# Patient Record
Sex: Male | Born: 1945 | Race: White | Hispanic: No | Marital: Married | Smoking: Former smoker
Health system: Southern US, Community
[De-identification: ages and names within clinical notes are randomized; demographics above are authoritative.]

## PROBLEM LIST (undated history)

## (undated) DIAGNOSIS — I2510 Atherosclerotic heart disease of native coronary artery without angina pectoris: Secondary | ICD-10-CM

## (undated) DIAGNOSIS — B019 Varicella without complication: Secondary | ICD-10-CM

## (undated) DIAGNOSIS — E785 Hyperlipidemia, unspecified: Secondary | ICD-10-CM

## (undated) DIAGNOSIS — D649 Anemia, unspecified: Secondary | ICD-10-CM

## (undated) DIAGNOSIS — M1990 Unspecified osteoarthritis, unspecified site: Secondary | ICD-10-CM

## (undated) DIAGNOSIS — R001 Bradycardia, unspecified: Secondary | ICD-10-CM

## (undated) DIAGNOSIS — H269 Unspecified cataract: Secondary | ICD-10-CM

## (undated) DIAGNOSIS — F419 Anxiety disorder, unspecified: Secondary | ICD-10-CM

## (undated) DIAGNOSIS — T7840XA Allergy, unspecified, initial encounter: Secondary | ICD-10-CM

## (undated) DIAGNOSIS — I10 Essential (primary) hypertension: Secondary | ICD-10-CM

## (undated) DIAGNOSIS — C4490 Unspecified malignant neoplasm of skin, unspecified: Secondary | ICD-10-CM

## (undated) HISTORY — PX: SKIN BIOPSY: SHX1

## (undated) HISTORY — DX: Essential (primary) hypertension: I10

## (undated) HISTORY — DX: Bradycardia, unspecified: R00.1

## (undated) HISTORY — PX: CORONARY ARTERY BYPASS GRAFT: SHX141

## (undated) HISTORY — DX: Hyperlipidemia, unspecified: E78.5

## (undated) HISTORY — PX: EYE SURGERY: SHX253

## (undated) HISTORY — DX: Anxiety disorder, unspecified: F41.9

## (undated) HISTORY — DX: Unspecified cataract: H26.9

## (undated) HISTORY — DX: Unspecified malignant neoplasm of skin, unspecified: C44.90

## (undated) HISTORY — PX: HERNIA REPAIR: SHX51

## (undated) HISTORY — DX: Varicella without complication: B01.9

## (undated) HISTORY — PX: CATARACT EXTRACTION: SUR2

## (undated) HISTORY — DX: Atherosclerotic heart disease of native coronary artery without angina pectoris: I25.10

## (undated) HISTORY — DX: Unspecified osteoarthritis, unspecified site: M19.90

## (undated) HISTORY — DX: Allergy, unspecified, initial encounter: T78.40XA

## (undated) HISTORY — DX: Anemia, unspecified: D64.9

## (undated) HISTORY — PX: REFRACTIVE SURGERY: SHX103

---

## 1994-12-08 DIAGNOSIS — I2510 Atherosclerotic heart disease of native coronary artery without angina pectoris: Secondary | ICD-10-CM

## 1994-12-08 HISTORY — PX: BYPASS GRAFT: SHX909

## 1994-12-08 HISTORY — DX: Atherosclerotic heart disease of native coronary artery without angina pectoris: I25.10

## 2001-12-08 HISTORY — PX: UMBILICAL HERNIA REPAIR: SHX196

## 2014-12-25 DIAGNOSIS — S33101A Dislocation of unspecified lumbar vertebra, initial encounter: Secondary | ICD-10-CM | POA: Diagnosis not present

## 2014-12-25 DIAGNOSIS — M5136 Other intervertebral disc degeneration, lumbar region: Secondary | ICD-10-CM | POA: Diagnosis not present

## 2014-12-25 DIAGNOSIS — S332XXA Dislocation of sacroiliac and sacrococcygeal joint, initial encounter: Secondary | ICD-10-CM | POA: Diagnosis not present

## 2014-12-25 DIAGNOSIS — M2540 Effusion, unspecified joint: Secondary | ICD-10-CM | POA: Diagnosis not present

## 2014-12-25 DIAGNOSIS — M5430 Sciatica, unspecified side: Secondary | ICD-10-CM | POA: Diagnosis not present

## 2014-12-25 DIAGNOSIS — M5020 Other cervical disc displacement, unspecified cervical region: Secondary | ICD-10-CM | POA: Diagnosis not present

## 2014-12-26 DIAGNOSIS — Z7901 Long term (current) use of anticoagulants: Secondary | ICD-10-CM | POA: Diagnosis not present

## 2014-12-26 DIAGNOSIS — I4891 Unspecified atrial fibrillation: Secondary | ICD-10-CM | POA: Diagnosis not present

## 2015-01-11 DIAGNOSIS — Z7901 Long term (current) use of anticoagulants: Secondary | ICD-10-CM | POA: Diagnosis not present

## 2015-01-11 DIAGNOSIS — I4891 Unspecified atrial fibrillation: Secondary | ICD-10-CM | POA: Diagnosis not present

## 2015-01-15 DIAGNOSIS — M5136 Other intervertebral disc degeneration, lumbar region: Secondary | ICD-10-CM | POA: Diagnosis not present

## 2015-01-15 DIAGNOSIS — M5430 Sciatica, unspecified side: Secondary | ICD-10-CM | POA: Diagnosis not present

## 2015-01-15 DIAGNOSIS — S332XXA Dislocation of sacroiliac and sacrococcygeal joint, initial encounter: Secondary | ICD-10-CM | POA: Diagnosis not present

## 2015-01-15 DIAGNOSIS — S33101A Dislocation of unspecified lumbar vertebra, initial encounter: Secondary | ICD-10-CM | POA: Diagnosis not present

## 2015-01-15 DIAGNOSIS — M5020 Other cervical disc displacement, unspecified cervical region: Secondary | ICD-10-CM | POA: Diagnosis not present

## 2015-01-15 DIAGNOSIS — M2540 Effusion, unspecified joint: Secondary | ICD-10-CM | POA: Diagnosis not present

## 2015-01-25 DIAGNOSIS — I4891 Unspecified atrial fibrillation: Secondary | ICD-10-CM | POA: Diagnosis not present

## 2015-01-25 DIAGNOSIS — Z7901 Long term (current) use of anticoagulants: Secondary | ICD-10-CM | POA: Diagnosis not present

## 2015-02-05 DIAGNOSIS — M2540 Effusion, unspecified joint: Secondary | ICD-10-CM | POA: Diagnosis not present

## 2015-02-05 DIAGNOSIS — M5430 Sciatica, unspecified side: Secondary | ICD-10-CM | POA: Diagnosis not present

## 2015-02-05 DIAGNOSIS — S33101A Dislocation of unspecified lumbar vertebra, initial encounter: Secondary | ICD-10-CM | POA: Diagnosis not present

## 2015-02-05 DIAGNOSIS — M5136 Other intervertebral disc degeneration, lumbar region: Secondary | ICD-10-CM | POA: Diagnosis not present

## 2015-02-05 DIAGNOSIS — M5020 Other cervical disc displacement, unspecified cervical region: Secondary | ICD-10-CM | POA: Diagnosis not present

## 2015-02-05 DIAGNOSIS — S332XXA Dislocation of sacroiliac and sacrococcygeal joint, initial encounter: Secondary | ICD-10-CM | POA: Diagnosis not present

## 2015-02-07 DIAGNOSIS — Z7901 Long term (current) use of anticoagulants: Secondary | ICD-10-CM | POA: Diagnosis not present

## 2015-02-07 DIAGNOSIS — I4891 Unspecified atrial fibrillation: Secondary | ICD-10-CM | POA: Diagnosis not present

## 2015-02-21 DIAGNOSIS — I4891 Unspecified atrial fibrillation: Secondary | ICD-10-CM | POA: Diagnosis not present

## 2015-02-21 DIAGNOSIS — Z5181 Encounter for therapeutic drug level monitoring: Secondary | ICD-10-CM | POA: Diagnosis not present

## 2015-02-21 DIAGNOSIS — Z7901 Long term (current) use of anticoagulants: Secondary | ICD-10-CM | POA: Diagnosis not present

## 2015-02-23 DIAGNOSIS — E785 Hyperlipidemia, unspecified: Secondary | ICD-10-CM | POA: Diagnosis not present

## 2015-02-23 DIAGNOSIS — E559 Vitamin D deficiency, unspecified: Secondary | ICD-10-CM | POA: Diagnosis not present

## 2015-02-23 DIAGNOSIS — Z125 Encounter for screening for malignant neoplasm of prostate: Secondary | ICD-10-CM | POA: Diagnosis not present

## 2015-02-23 DIAGNOSIS — I10 Essential (primary) hypertension: Secondary | ICD-10-CM | POA: Diagnosis not present

## 2015-02-23 DIAGNOSIS — I482 Chronic atrial fibrillation: Secondary | ICD-10-CM | POA: Diagnosis not present

## 2015-02-26 DIAGNOSIS — M5430 Sciatica, unspecified side: Secondary | ICD-10-CM | POA: Diagnosis not present

## 2015-02-26 DIAGNOSIS — M2540 Effusion, unspecified joint: Secondary | ICD-10-CM | POA: Diagnosis not present

## 2015-02-26 DIAGNOSIS — M5136 Other intervertebral disc degeneration, lumbar region: Secondary | ICD-10-CM | POA: Diagnosis not present

## 2015-02-26 DIAGNOSIS — M5020 Other cervical disc displacement, unspecified cervical region: Secondary | ICD-10-CM | POA: Diagnosis not present

## 2015-02-26 DIAGNOSIS — S332XXA Dislocation of sacroiliac and sacrococcygeal joint, initial encounter: Secondary | ICD-10-CM | POA: Diagnosis not present

## 2015-02-26 DIAGNOSIS — S33101A Dislocation of unspecified lumbar vertebra, initial encounter: Secondary | ICD-10-CM | POA: Diagnosis not present

## 2015-03-07 DIAGNOSIS — Z7901 Long term (current) use of anticoagulants: Secondary | ICD-10-CM | POA: Diagnosis not present

## 2015-03-07 DIAGNOSIS — I4891 Unspecified atrial fibrillation: Secondary | ICD-10-CM | POA: Diagnosis not present

## 2015-03-19 DIAGNOSIS — M5136 Other intervertebral disc degeneration, lumbar region: Secondary | ICD-10-CM | POA: Diagnosis not present

## 2015-03-19 DIAGNOSIS — S33101A Dislocation of unspecified lumbar vertebra, initial encounter: Secondary | ICD-10-CM | POA: Diagnosis not present

## 2015-03-19 DIAGNOSIS — S332XXA Dislocation of sacroiliac and sacrococcygeal joint, initial encounter: Secondary | ICD-10-CM | POA: Diagnosis not present

## 2015-03-19 DIAGNOSIS — M5020 Other cervical disc displacement, unspecified cervical region: Secondary | ICD-10-CM | POA: Diagnosis not present

## 2015-03-19 DIAGNOSIS — M5430 Sciatica, unspecified side: Secondary | ICD-10-CM | POA: Diagnosis not present

## 2015-03-19 DIAGNOSIS — M2540 Effusion, unspecified joint: Secondary | ICD-10-CM | POA: Diagnosis not present

## 2015-03-26 DIAGNOSIS — I4891 Unspecified atrial fibrillation: Secondary | ICD-10-CM | POA: Diagnosis not present

## 2015-03-26 DIAGNOSIS — Z7901 Long term (current) use of anticoagulants: Secondary | ICD-10-CM | POA: Diagnosis not present

## 2015-04-09 DIAGNOSIS — S33101A Dislocation of unspecified lumbar vertebra, initial encounter: Secondary | ICD-10-CM | POA: Diagnosis not present

## 2015-04-09 DIAGNOSIS — Z7901 Long term (current) use of anticoagulants: Secondary | ICD-10-CM | POA: Diagnosis not present

## 2015-04-09 DIAGNOSIS — S332XXA Dislocation of sacroiliac and sacrococcygeal joint, initial encounter: Secondary | ICD-10-CM | POA: Diagnosis not present

## 2015-04-09 DIAGNOSIS — M5136 Other intervertebral disc degeneration, lumbar region: Secondary | ICD-10-CM | POA: Diagnosis not present

## 2015-04-09 DIAGNOSIS — M5020 Other cervical disc displacement, unspecified cervical region: Secondary | ICD-10-CM | POA: Diagnosis not present

## 2015-04-09 DIAGNOSIS — M2540 Effusion, unspecified joint: Secondary | ICD-10-CM | POA: Diagnosis not present

## 2015-04-09 DIAGNOSIS — M5430 Sciatica, unspecified side: Secondary | ICD-10-CM | POA: Diagnosis not present

## 2015-04-09 DIAGNOSIS — I4891 Unspecified atrial fibrillation: Secondary | ICD-10-CM | POA: Diagnosis not present

## 2015-04-25 DIAGNOSIS — M549 Dorsalgia, unspecified: Secondary | ICD-10-CM | POA: Diagnosis not present

## 2015-04-25 DIAGNOSIS — Z0000 Encounter for general adult medical examination without abnormal findings: Secondary | ICD-10-CM | POA: Diagnosis not present

## 2015-04-25 DIAGNOSIS — E785 Hyperlipidemia, unspecified: Secondary | ICD-10-CM | POA: Diagnosis not present

## 2015-04-25 DIAGNOSIS — M40204 Unspecified kyphosis, thoracic region: Secondary | ICD-10-CM | POA: Diagnosis not present

## 2015-04-25 DIAGNOSIS — Z23 Encounter for immunization: Secondary | ICD-10-CM | POA: Diagnosis not present

## 2015-04-25 DIAGNOSIS — I10 Essential (primary) hypertension: Secondary | ICD-10-CM | POA: Diagnosis not present

## 2015-04-25 DIAGNOSIS — E559 Vitamin D deficiency, unspecified: Secondary | ICD-10-CM | POA: Diagnosis not present

## 2015-04-26 DIAGNOSIS — Z7901 Long term (current) use of anticoagulants: Secondary | ICD-10-CM | POA: Diagnosis not present

## 2015-04-26 DIAGNOSIS — I4891 Unspecified atrial fibrillation: Secondary | ICD-10-CM | POA: Diagnosis not present

## 2015-04-30 DIAGNOSIS — M25411 Effusion, right shoulder: Secondary | ICD-10-CM | POA: Diagnosis not present

## 2015-04-30 DIAGNOSIS — S332XXA Dislocation of sacroiliac and sacrococcygeal joint, initial encounter: Secondary | ICD-10-CM | POA: Diagnosis not present

## 2015-04-30 DIAGNOSIS — M5432 Sciatica, left side: Secondary | ICD-10-CM | POA: Diagnosis not present

## 2015-04-30 DIAGNOSIS — M5431 Sciatica, right side: Secondary | ICD-10-CM | POA: Diagnosis not present

## 2015-04-30 DIAGNOSIS — M5021 Other cervical disc displacement,  high cervical region: Secondary | ICD-10-CM | POA: Diagnosis not present

## 2015-04-30 DIAGNOSIS — S33111A Dislocation of L1/L2 lumbar vertebra, initial encounter: Secondary | ICD-10-CM | POA: Diagnosis not present

## 2015-04-30 DIAGNOSIS — M5136 Other intervertebral disc degeneration, lumbar region: Secondary | ICD-10-CM | POA: Diagnosis not present

## 2015-05-03 DIAGNOSIS — Z85828 Personal history of other malignant neoplasm of skin: Secondary | ICD-10-CM | POA: Diagnosis not present

## 2015-05-03 DIAGNOSIS — C4492 Squamous cell carcinoma of skin, unspecified: Secondary | ICD-10-CM | POA: Diagnosis not present

## 2015-05-03 DIAGNOSIS — D485 Neoplasm of uncertain behavior of skin: Secondary | ICD-10-CM | POA: Diagnosis not present

## 2015-05-03 DIAGNOSIS — C44329 Squamous cell carcinoma of skin of other parts of face: Secondary | ICD-10-CM | POA: Diagnosis not present

## 2015-05-04 DIAGNOSIS — M40204 Unspecified kyphosis, thoracic region: Secondary | ICD-10-CM | POA: Diagnosis not present

## 2015-05-14 DIAGNOSIS — Z7901 Long term (current) use of anticoagulants: Secondary | ICD-10-CM | POA: Diagnosis not present

## 2015-05-14 DIAGNOSIS — M5136 Other intervertebral disc degeneration, lumbar region: Secondary | ICD-10-CM | POA: Diagnosis not present

## 2015-05-14 DIAGNOSIS — M5432 Sciatica, left side: Secondary | ICD-10-CM | POA: Diagnosis not present

## 2015-05-14 DIAGNOSIS — M5021 Other cervical disc displacement,  high cervical region: Secondary | ICD-10-CM | POA: Diagnosis not present

## 2015-05-14 DIAGNOSIS — I4891 Unspecified atrial fibrillation: Secondary | ICD-10-CM | POA: Diagnosis not present

## 2015-05-14 DIAGNOSIS — S33111A Dislocation of L1/L2 lumbar vertebra, initial encounter: Secondary | ICD-10-CM | POA: Diagnosis not present

## 2015-05-14 DIAGNOSIS — M25411 Effusion, right shoulder: Secondary | ICD-10-CM | POA: Diagnosis not present

## 2015-05-14 DIAGNOSIS — S332XXA Dislocation of sacroiliac and sacrococcygeal joint, initial encounter: Secondary | ICD-10-CM | POA: Diagnosis not present

## 2015-05-14 DIAGNOSIS — M5431 Sciatica, right side: Secondary | ICD-10-CM | POA: Diagnosis not present

## 2015-06-04 DIAGNOSIS — M25411 Effusion, right shoulder: Secondary | ICD-10-CM | POA: Diagnosis not present

## 2015-06-04 DIAGNOSIS — S332XXA Dislocation of sacroiliac and sacrococcygeal joint, initial encounter: Secondary | ICD-10-CM | POA: Diagnosis not present

## 2015-06-04 DIAGNOSIS — M5021 Other cervical disc displacement,  high cervical region: Secondary | ICD-10-CM | POA: Diagnosis not present

## 2015-06-04 DIAGNOSIS — M5431 Sciatica, right side: Secondary | ICD-10-CM | POA: Diagnosis not present

## 2015-06-04 DIAGNOSIS — M5136 Other intervertebral disc degeneration, lumbar region: Secondary | ICD-10-CM | POA: Diagnosis not present

## 2015-06-04 DIAGNOSIS — Z7901 Long term (current) use of anticoagulants: Secondary | ICD-10-CM | POA: Diagnosis not present

## 2015-06-04 DIAGNOSIS — S33111A Dislocation of L1/L2 lumbar vertebra, initial encounter: Secondary | ICD-10-CM | POA: Diagnosis not present

## 2015-06-04 DIAGNOSIS — M5432 Sciatica, left side: Secondary | ICD-10-CM | POA: Diagnosis not present

## 2015-06-04 DIAGNOSIS — I4891 Unspecified atrial fibrillation: Secondary | ICD-10-CM | POA: Diagnosis not present

## 2015-06-04 DIAGNOSIS — Z5181 Encounter for therapeutic drug level monitoring: Secondary | ICD-10-CM | POA: Diagnosis not present

## 2015-06-07 DIAGNOSIS — C4492 Squamous cell carcinoma of skin, unspecified: Secondary | ICD-10-CM | POA: Diagnosis not present

## 2015-06-07 DIAGNOSIS — C44329 Squamous cell carcinoma of skin of other parts of face: Secondary | ICD-10-CM | POA: Diagnosis not present

## 2015-06-07 DIAGNOSIS — C44212 Basal cell carcinoma of skin of right ear and external auricular canal: Secondary | ICD-10-CM | POA: Diagnosis not present

## 2015-06-18 DIAGNOSIS — M5431 Sciatica, right side: Secondary | ICD-10-CM | POA: Diagnosis not present

## 2015-06-18 DIAGNOSIS — M5136 Other intervertebral disc degeneration, lumbar region: Secondary | ICD-10-CM | POA: Diagnosis not present

## 2015-06-18 DIAGNOSIS — Z7901 Long term (current) use of anticoagulants: Secondary | ICD-10-CM | POA: Diagnosis not present

## 2015-06-18 DIAGNOSIS — M25411 Effusion, right shoulder: Secondary | ICD-10-CM | POA: Diagnosis not present

## 2015-06-18 DIAGNOSIS — S33111A Dislocation of L1/L2 lumbar vertebra, initial encounter: Secondary | ICD-10-CM | POA: Diagnosis not present

## 2015-06-18 DIAGNOSIS — M5432 Sciatica, left side: Secondary | ICD-10-CM | POA: Diagnosis not present

## 2015-06-18 DIAGNOSIS — S332XXA Dislocation of sacroiliac and sacrococcygeal joint, initial encounter: Secondary | ICD-10-CM | POA: Diagnosis not present

## 2015-06-18 DIAGNOSIS — M5021 Other cervical disc displacement,  high cervical region: Secondary | ICD-10-CM | POA: Diagnosis not present

## 2015-06-18 DIAGNOSIS — I4891 Unspecified atrial fibrillation: Secondary | ICD-10-CM | POA: Diagnosis not present

## 2015-07-02 DIAGNOSIS — M25411 Effusion, right shoulder: Secondary | ICD-10-CM | POA: Diagnosis not present

## 2015-07-02 DIAGNOSIS — M5136 Other intervertebral disc degeneration, lumbar region: Secondary | ICD-10-CM | POA: Diagnosis not present

## 2015-07-02 DIAGNOSIS — S332XXA Dislocation of sacroiliac and sacrococcygeal joint, initial encounter: Secondary | ICD-10-CM | POA: Diagnosis not present

## 2015-07-02 DIAGNOSIS — M5432 Sciatica, left side: Secondary | ICD-10-CM | POA: Diagnosis not present

## 2015-07-02 DIAGNOSIS — M5431 Sciatica, right side: Secondary | ICD-10-CM | POA: Diagnosis not present

## 2015-07-02 DIAGNOSIS — M5021 Other cervical disc displacement,  high cervical region: Secondary | ICD-10-CM | POA: Diagnosis not present

## 2015-07-02 DIAGNOSIS — S33111A Dislocation of L1/L2 lumbar vertebra, initial encounter: Secondary | ICD-10-CM | POA: Diagnosis not present

## 2015-07-16 DIAGNOSIS — Z5181 Encounter for therapeutic drug level monitoring: Secondary | ICD-10-CM | POA: Diagnosis not present

## 2015-07-16 DIAGNOSIS — Z7901 Long term (current) use of anticoagulants: Secondary | ICD-10-CM | POA: Diagnosis not present

## 2015-07-16 DIAGNOSIS — S332XXA Dislocation of sacroiliac and sacrococcygeal joint, initial encounter: Secondary | ICD-10-CM | POA: Diagnosis not present

## 2015-07-16 DIAGNOSIS — M5430 Sciatica, unspecified side: Secondary | ICD-10-CM | POA: Diagnosis not present

## 2015-07-16 DIAGNOSIS — M5136 Other intervertebral disc degeneration, lumbar region: Secondary | ICD-10-CM | POA: Diagnosis not present

## 2015-07-16 DIAGNOSIS — I4891 Unspecified atrial fibrillation: Secondary | ICD-10-CM | POA: Diagnosis not present

## 2015-07-16 DIAGNOSIS — M2540 Effusion, unspecified joint: Secondary | ICD-10-CM | POA: Diagnosis not present

## 2015-07-16 DIAGNOSIS — S33101A Dislocation of unspecified lumbar vertebra, initial encounter: Secondary | ICD-10-CM | POA: Diagnosis not present

## 2015-07-16 DIAGNOSIS — M5020 Other cervical disc displacement, unspecified cervical region: Secondary | ICD-10-CM | POA: Diagnosis not present

## 2015-07-18 DIAGNOSIS — C4492 Squamous cell carcinoma of skin, unspecified: Secondary | ICD-10-CM | POA: Diagnosis not present

## 2015-07-18 DIAGNOSIS — C44329 Squamous cell carcinoma of skin of other parts of face: Secondary | ICD-10-CM | POA: Diagnosis not present

## 2015-07-30 DIAGNOSIS — M2540 Effusion, unspecified joint: Secondary | ICD-10-CM | POA: Diagnosis not present

## 2015-07-30 DIAGNOSIS — M5020 Other cervical disc displacement, unspecified cervical region: Secondary | ICD-10-CM | POA: Diagnosis not present

## 2015-07-30 DIAGNOSIS — S332XXA Dislocation of sacroiliac and sacrococcygeal joint, initial encounter: Secondary | ICD-10-CM | POA: Diagnosis not present

## 2015-07-30 DIAGNOSIS — M5136 Other intervertebral disc degeneration, lumbar region: Secondary | ICD-10-CM | POA: Diagnosis not present

## 2015-07-30 DIAGNOSIS — M5430 Sciatica, unspecified side: Secondary | ICD-10-CM | POA: Diagnosis not present

## 2015-07-30 DIAGNOSIS — S33101A Dislocation of unspecified lumbar vertebra, initial encounter: Secondary | ICD-10-CM | POA: Diagnosis not present

## 2015-08-08 DIAGNOSIS — Z7901 Long term (current) use of anticoagulants: Secondary | ICD-10-CM | POA: Diagnosis not present

## 2015-08-08 DIAGNOSIS — I4891 Unspecified atrial fibrillation: Secondary | ICD-10-CM | POA: Diagnosis not present

## 2015-08-08 DIAGNOSIS — Z5181 Encounter for therapeutic drug level monitoring: Secondary | ICD-10-CM | POA: Diagnosis not present

## 2015-08-15 DIAGNOSIS — S332XXA Dislocation of sacroiliac and sacrococcygeal joint, initial encounter: Secondary | ICD-10-CM | POA: Diagnosis not present

## 2015-08-15 DIAGNOSIS — M25411 Effusion, right shoulder: Secondary | ICD-10-CM | POA: Diagnosis not present

## 2015-08-15 DIAGNOSIS — M5431 Sciatica, right side: Secondary | ICD-10-CM | POA: Diagnosis not present

## 2015-08-15 DIAGNOSIS — M5432 Sciatica, left side: Secondary | ICD-10-CM | POA: Diagnosis not present

## 2015-08-15 DIAGNOSIS — S33111A Dislocation of L1/L2 lumbar vertebra, initial encounter: Secondary | ICD-10-CM | POA: Diagnosis not present

## 2015-08-15 DIAGNOSIS — M5136 Other intervertebral disc degeneration, lumbar region: Secondary | ICD-10-CM | POA: Diagnosis not present

## 2015-08-15 DIAGNOSIS — M5021 Other cervical disc displacement,  high cervical region: Secondary | ICD-10-CM | POA: Diagnosis not present

## 2015-08-31 DIAGNOSIS — S332XXA Dislocation of sacroiliac and sacrococcygeal joint, initial encounter: Secondary | ICD-10-CM | POA: Diagnosis not present

## 2015-08-31 DIAGNOSIS — S33111A Dislocation of L1/L2 lumbar vertebra, initial encounter: Secondary | ICD-10-CM | POA: Diagnosis not present

## 2015-08-31 DIAGNOSIS — M5136 Other intervertebral disc degeneration, lumbar region: Secondary | ICD-10-CM | POA: Diagnosis not present

## 2015-08-31 DIAGNOSIS — M5021 Other cervical disc displacement,  high cervical region: Secondary | ICD-10-CM | POA: Diagnosis not present

## 2015-08-31 DIAGNOSIS — M25411 Effusion, right shoulder: Secondary | ICD-10-CM | POA: Diagnosis not present

## 2015-08-31 DIAGNOSIS — M5431 Sciatica, right side: Secondary | ICD-10-CM | POA: Diagnosis not present

## 2015-08-31 DIAGNOSIS — Z7901 Long term (current) use of anticoagulants: Secondary | ICD-10-CM | POA: Diagnosis not present

## 2015-08-31 DIAGNOSIS — I4891 Unspecified atrial fibrillation: Secondary | ICD-10-CM | POA: Diagnosis not present

## 2015-08-31 DIAGNOSIS — Z5181 Encounter for therapeutic drug level monitoring: Secondary | ICD-10-CM | POA: Diagnosis not present

## 2015-08-31 DIAGNOSIS — M5432 Sciatica, left side: Secondary | ICD-10-CM | POA: Diagnosis not present

## 2015-09-14 DIAGNOSIS — I4891 Unspecified atrial fibrillation: Secondary | ICD-10-CM | POA: Diagnosis not present

## 2015-09-14 DIAGNOSIS — Z5181 Encounter for therapeutic drug level monitoring: Secondary | ICD-10-CM | POA: Diagnosis not present

## 2015-09-14 DIAGNOSIS — M5136 Other intervertebral disc degeneration, lumbar region: Secondary | ICD-10-CM | POA: Diagnosis not present

## 2015-09-14 DIAGNOSIS — M5021 Other cervical disc displacement,  high cervical region: Secondary | ICD-10-CM | POA: Diagnosis not present

## 2015-09-14 DIAGNOSIS — M5432 Sciatica, left side: Secondary | ICD-10-CM | POA: Diagnosis not present

## 2015-09-14 DIAGNOSIS — M25411 Effusion, right shoulder: Secondary | ICD-10-CM | POA: Diagnosis not present

## 2015-09-14 DIAGNOSIS — S332XXA Dislocation of sacroiliac and sacrococcygeal joint, initial encounter: Secondary | ICD-10-CM | POA: Diagnosis not present

## 2015-09-14 DIAGNOSIS — M5431 Sciatica, right side: Secondary | ICD-10-CM | POA: Diagnosis not present

## 2015-09-14 DIAGNOSIS — Z7901 Long term (current) use of anticoagulants: Secondary | ICD-10-CM | POA: Diagnosis not present

## 2015-09-14 DIAGNOSIS — S33111A Dislocation of L1/L2 lumbar vertebra, initial encounter: Secondary | ICD-10-CM | POA: Diagnosis not present

## 2015-10-03 DIAGNOSIS — M9902 Segmental and somatic dysfunction of thoracic region: Secondary | ICD-10-CM | POA: Diagnosis not present

## 2015-10-03 DIAGNOSIS — M9905 Segmental and somatic dysfunction of pelvic region: Secondary | ICD-10-CM | POA: Diagnosis not present

## 2015-10-03 DIAGNOSIS — M5135 Other intervertebral disc degeneration, thoracolumbar region: Secondary | ICD-10-CM | POA: Diagnosis not present

## 2015-10-03 DIAGNOSIS — M5124 Other intervertebral disc displacement, thoracic region: Secondary | ICD-10-CM | POA: Diagnosis not present

## 2015-10-03 DIAGNOSIS — M9903 Segmental and somatic dysfunction of lumbar region: Secondary | ICD-10-CM | POA: Diagnosis not present

## 2015-10-03 DIAGNOSIS — M9904 Segmental and somatic dysfunction of sacral region: Secondary | ICD-10-CM | POA: Diagnosis not present

## 2015-10-03 DIAGNOSIS — M5134 Other intervertebral disc degeneration, thoracic region: Secondary | ICD-10-CM | POA: Diagnosis not present

## 2015-10-03 DIAGNOSIS — M5137 Other intervertebral disc degeneration, lumbosacral region: Secondary | ICD-10-CM | POA: Diagnosis not present

## 2015-10-03 DIAGNOSIS — M5136 Other intervertebral disc degeneration, lumbar region: Secondary | ICD-10-CM | POA: Diagnosis not present

## 2015-10-04 DIAGNOSIS — I4891 Unspecified atrial fibrillation: Secondary | ICD-10-CM | POA: Diagnosis not present

## 2015-10-04 DIAGNOSIS — Z5181 Encounter for therapeutic drug level monitoring: Secondary | ICD-10-CM | POA: Diagnosis not present

## 2015-10-04 DIAGNOSIS — Z7901 Long term (current) use of anticoagulants: Secondary | ICD-10-CM | POA: Diagnosis not present

## 2015-10-04 DIAGNOSIS — E7800 Pure hypercholesterolemia, unspecified: Secondary | ICD-10-CM | POA: Diagnosis not present

## 2015-10-16 DIAGNOSIS — H2512 Age-related nuclear cataract, left eye: Secondary | ICD-10-CM | POA: Diagnosis not present

## 2015-10-16 DIAGNOSIS — Z7901 Long term (current) use of anticoagulants: Secondary | ICD-10-CM | POA: Diagnosis not present

## 2015-10-16 DIAGNOSIS — I480 Paroxysmal atrial fibrillation: Secondary | ICD-10-CM | POA: Diagnosis not present

## 2015-10-16 DIAGNOSIS — E7800 Pure hypercholesterolemia, unspecified: Secondary | ICD-10-CM | POA: Diagnosis not present

## 2015-10-16 DIAGNOSIS — I4891 Unspecified atrial fibrillation: Secondary | ICD-10-CM | POA: Diagnosis not present

## 2015-10-16 DIAGNOSIS — H26491 Other secondary cataract, right eye: Secondary | ICD-10-CM | POA: Diagnosis not present

## 2015-10-19 DIAGNOSIS — I4891 Unspecified atrial fibrillation: Secondary | ICD-10-CM | POA: Diagnosis not present

## 2015-10-19 DIAGNOSIS — Z7901 Long term (current) use of anticoagulants: Secondary | ICD-10-CM | POA: Diagnosis not present

## 2015-11-07 DIAGNOSIS — Z7901 Long term (current) use of anticoagulants: Secondary | ICD-10-CM | POA: Diagnosis not present

## 2015-11-07 DIAGNOSIS — Z5181 Encounter for therapeutic drug level monitoring: Secondary | ICD-10-CM | POA: Diagnosis not present

## 2015-11-07 DIAGNOSIS — I4891 Unspecified atrial fibrillation: Secondary | ICD-10-CM | POA: Diagnosis not present

## 2015-11-21 DIAGNOSIS — Z7901 Long term (current) use of anticoagulants: Secondary | ICD-10-CM | POA: Diagnosis not present

## 2015-12-05 DIAGNOSIS — Z7901 Long term (current) use of anticoagulants: Secondary | ICD-10-CM | POA: Diagnosis not present

## 2015-12-05 DIAGNOSIS — I4891 Unspecified atrial fibrillation: Secondary | ICD-10-CM | POA: Diagnosis not present

## 2015-12-05 DIAGNOSIS — Z5181 Encounter for therapeutic drug level monitoring: Secondary | ICD-10-CM | POA: Diagnosis not present

## 2015-12-21 DIAGNOSIS — Z5181 Encounter for therapeutic drug level monitoring: Secondary | ICD-10-CM | POA: Diagnosis not present

## 2015-12-21 DIAGNOSIS — Z7901 Long term (current) use of anticoagulants: Secondary | ICD-10-CM | POA: Diagnosis not present

## 2015-12-21 DIAGNOSIS — I4891 Unspecified atrial fibrillation: Secondary | ICD-10-CM | POA: Diagnosis not present

## 2016-01-04 DIAGNOSIS — Z7901 Long term (current) use of anticoagulants: Secondary | ICD-10-CM | POA: Diagnosis not present

## 2016-01-04 DIAGNOSIS — I4891 Unspecified atrial fibrillation: Secondary | ICD-10-CM | POA: Diagnosis not present

## 2016-01-18 DIAGNOSIS — Z7901 Long term (current) use of anticoagulants: Secondary | ICD-10-CM | POA: Diagnosis not present

## 2016-01-18 DIAGNOSIS — I4891 Unspecified atrial fibrillation: Secondary | ICD-10-CM | POA: Diagnosis not present

## 2016-02-05 DIAGNOSIS — I4891 Unspecified atrial fibrillation: Secondary | ICD-10-CM | POA: Diagnosis not present

## 2016-02-05 DIAGNOSIS — Z5181 Encounter for therapeutic drug level monitoring: Secondary | ICD-10-CM | POA: Diagnosis not present

## 2016-02-05 DIAGNOSIS — Z7901 Long term (current) use of anticoagulants: Secondary | ICD-10-CM | POA: Diagnosis not present

## 2016-02-15 DIAGNOSIS — I4891 Unspecified atrial fibrillation: Secondary | ICD-10-CM | POA: Diagnosis not present

## 2016-02-15 DIAGNOSIS — Z7901 Long term (current) use of anticoagulants: Secondary | ICD-10-CM | POA: Diagnosis not present

## 2016-02-15 DIAGNOSIS — Z5181 Encounter for therapeutic drug level monitoring: Secondary | ICD-10-CM | POA: Diagnosis not present

## 2016-03-03 DIAGNOSIS — I4891 Unspecified atrial fibrillation: Secondary | ICD-10-CM | POA: Diagnosis not present

## 2016-03-03 DIAGNOSIS — Z5181 Encounter for therapeutic drug level monitoring: Secondary | ICD-10-CM | POA: Diagnosis not present

## 2016-03-03 DIAGNOSIS — Z7901 Long term (current) use of anticoagulants: Secondary | ICD-10-CM | POA: Diagnosis not present

## 2016-03-25 DIAGNOSIS — L814 Other melanin hyperpigmentation: Secondary | ICD-10-CM | POA: Diagnosis not present

## 2016-03-25 DIAGNOSIS — D1801 Hemangioma of skin and subcutaneous tissue: Secondary | ICD-10-CM | POA: Diagnosis not present

## 2016-03-25 DIAGNOSIS — L821 Other seborrheic keratosis: Secondary | ICD-10-CM | POA: Diagnosis not present

## 2016-03-25 DIAGNOSIS — L538 Other specified erythematous conditions: Secondary | ICD-10-CM | POA: Diagnosis not present

## 2016-03-25 DIAGNOSIS — L820 Inflamed seborrheic keratosis: Secondary | ICD-10-CM | POA: Diagnosis not present

## 2016-03-31 DIAGNOSIS — Z7901 Long term (current) use of anticoagulants: Secondary | ICD-10-CM | POA: Diagnosis not present

## 2016-03-31 DIAGNOSIS — I4891 Unspecified atrial fibrillation: Secondary | ICD-10-CM | POA: Diagnosis not present

## 2016-03-31 DIAGNOSIS — Z5181 Encounter for therapeutic drug level monitoring: Secondary | ICD-10-CM | POA: Diagnosis not present

## 2016-04-30 DIAGNOSIS — Z5181 Encounter for therapeutic drug level monitoring: Secondary | ICD-10-CM | POA: Diagnosis not present

## 2016-04-30 DIAGNOSIS — Z7901 Long term (current) use of anticoagulants: Secondary | ICD-10-CM | POA: Diagnosis not present

## 2016-04-30 DIAGNOSIS — I4891 Unspecified atrial fibrillation: Secondary | ICD-10-CM | POA: Diagnosis not present

## 2016-05-16 DIAGNOSIS — Z7901 Long term (current) use of anticoagulants: Secondary | ICD-10-CM | POA: Diagnosis not present

## 2016-05-16 DIAGNOSIS — Z5181 Encounter for therapeutic drug level monitoring: Secondary | ICD-10-CM | POA: Diagnosis not present

## 2016-05-16 DIAGNOSIS — I4891 Unspecified atrial fibrillation: Secondary | ICD-10-CM | POA: Diagnosis not present

## 2016-05-30 DIAGNOSIS — Z5181 Encounter for therapeutic drug level monitoring: Secondary | ICD-10-CM | POA: Diagnosis not present

## 2016-05-30 DIAGNOSIS — Z7901 Long term (current) use of anticoagulants: Secondary | ICD-10-CM | POA: Diagnosis not present

## 2016-05-30 DIAGNOSIS — I4891 Unspecified atrial fibrillation: Secondary | ICD-10-CM | POA: Diagnosis not present

## 2016-06-27 DIAGNOSIS — Z5181 Encounter for therapeutic drug level monitoring: Secondary | ICD-10-CM | POA: Diagnosis not present

## 2016-06-27 DIAGNOSIS — Z7901 Long term (current) use of anticoagulants: Secondary | ICD-10-CM | POA: Diagnosis not present

## 2016-06-27 DIAGNOSIS — I4891 Unspecified atrial fibrillation: Secondary | ICD-10-CM | POA: Diagnosis not present

## 2016-07-22 DIAGNOSIS — H26491 Other secondary cataract, right eye: Secondary | ICD-10-CM | POA: Diagnosis not present

## 2016-07-22 DIAGNOSIS — H2512 Age-related nuclear cataract, left eye: Secondary | ICD-10-CM | POA: Diagnosis not present

## 2016-07-22 DIAGNOSIS — H10413 Chronic giant papillary conjunctivitis, bilateral: Secondary | ICD-10-CM | POA: Diagnosis not present

## 2016-07-25 DIAGNOSIS — Z7901 Long term (current) use of anticoagulants: Secondary | ICD-10-CM | POA: Diagnosis not present

## 2016-07-25 DIAGNOSIS — I4891 Unspecified atrial fibrillation: Secondary | ICD-10-CM | POA: Diagnosis not present

## 2016-07-25 DIAGNOSIS — Z5181 Encounter for therapeutic drug level monitoring: Secondary | ICD-10-CM | POA: Diagnosis not present

## 2016-08-18 DIAGNOSIS — Z5181 Encounter for therapeutic drug level monitoring: Secondary | ICD-10-CM | POA: Diagnosis not present

## 2016-08-18 DIAGNOSIS — I4891 Unspecified atrial fibrillation: Secondary | ICD-10-CM | POA: Diagnosis not present

## 2016-08-18 DIAGNOSIS — Z7901 Long term (current) use of anticoagulants: Secondary | ICD-10-CM | POA: Diagnosis not present

## 2016-09-19 DIAGNOSIS — M5134 Other intervertebral disc degeneration, thoracic region: Secondary | ICD-10-CM | POA: Diagnosis not present

## 2016-09-19 DIAGNOSIS — I4891 Unspecified atrial fibrillation: Secondary | ICD-10-CM | POA: Diagnosis not present

## 2016-09-19 DIAGNOSIS — M5137 Other intervertebral disc degeneration, lumbosacral region: Secondary | ICD-10-CM | POA: Diagnosis not present

## 2016-09-19 DIAGNOSIS — M9905 Segmental and somatic dysfunction of pelvic region: Secondary | ICD-10-CM | POA: Diagnosis not present

## 2016-09-19 DIAGNOSIS — M5135 Other intervertebral disc degeneration, thoracolumbar region: Secondary | ICD-10-CM | POA: Diagnosis not present

## 2016-09-19 DIAGNOSIS — M5124 Other intervertebral disc displacement, thoracic region: Secondary | ICD-10-CM | POA: Diagnosis not present

## 2016-09-19 DIAGNOSIS — M9902 Segmental and somatic dysfunction of thoracic region: Secondary | ICD-10-CM | POA: Diagnosis not present

## 2016-09-19 DIAGNOSIS — M9903 Segmental and somatic dysfunction of lumbar region: Secondary | ICD-10-CM | POA: Diagnosis not present

## 2016-09-19 DIAGNOSIS — Z7901 Long term (current) use of anticoagulants: Secondary | ICD-10-CM | POA: Diagnosis not present

## 2016-09-19 DIAGNOSIS — M5136 Other intervertebral disc degeneration, lumbar region: Secondary | ICD-10-CM | POA: Diagnosis not present

## 2016-09-19 DIAGNOSIS — M9904 Segmental and somatic dysfunction of sacral region: Secondary | ICD-10-CM | POA: Diagnosis not present

## 2016-10-03 DIAGNOSIS — M9904 Segmental and somatic dysfunction of sacral region: Secondary | ICD-10-CM | POA: Diagnosis not present

## 2016-10-03 DIAGNOSIS — M5134 Other intervertebral disc degeneration, thoracic region: Secondary | ICD-10-CM | POA: Diagnosis not present

## 2016-10-03 DIAGNOSIS — M5136 Other intervertebral disc degeneration, lumbar region: Secondary | ICD-10-CM | POA: Diagnosis not present

## 2016-10-03 DIAGNOSIS — M9902 Segmental and somatic dysfunction of thoracic region: Secondary | ICD-10-CM | POA: Diagnosis not present

## 2016-10-03 DIAGNOSIS — M9903 Segmental and somatic dysfunction of lumbar region: Secondary | ICD-10-CM | POA: Diagnosis not present

## 2016-10-03 DIAGNOSIS — M5135 Other intervertebral disc degeneration, thoracolumbar region: Secondary | ICD-10-CM | POA: Diagnosis not present

## 2016-10-03 DIAGNOSIS — M5137 Other intervertebral disc degeneration, lumbosacral region: Secondary | ICD-10-CM | POA: Diagnosis not present

## 2016-10-03 DIAGNOSIS — M9905 Segmental and somatic dysfunction of pelvic region: Secondary | ICD-10-CM | POA: Diagnosis not present

## 2016-10-03 DIAGNOSIS — M5124 Other intervertebral disc displacement, thoracic region: Secondary | ICD-10-CM | POA: Diagnosis not present

## 2016-10-10 DIAGNOSIS — Z7901 Long term (current) use of anticoagulants: Secondary | ICD-10-CM | POA: Diagnosis not present

## 2016-10-10 DIAGNOSIS — Z125 Encounter for screening for malignant neoplasm of prostate: Secondary | ICD-10-CM | POA: Diagnosis not present

## 2016-10-10 DIAGNOSIS — I10 Essential (primary) hypertension: Secondary | ICD-10-CM | POA: Diagnosis not present

## 2016-10-10 DIAGNOSIS — Z5181 Encounter for therapeutic drug level monitoring: Secondary | ICD-10-CM | POA: Diagnosis not present

## 2016-10-10 DIAGNOSIS — I4891 Unspecified atrial fibrillation: Secondary | ICD-10-CM | POA: Diagnosis not present

## 2016-10-10 DIAGNOSIS — E7800 Pure hypercholesterolemia, unspecified: Secondary | ICD-10-CM | POA: Diagnosis not present

## 2016-10-15 DIAGNOSIS — M9902 Segmental and somatic dysfunction of thoracic region: Secondary | ICD-10-CM | POA: Diagnosis not present

## 2016-10-15 DIAGNOSIS — M9905 Segmental and somatic dysfunction of pelvic region: Secondary | ICD-10-CM | POA: Diagnosis not present

## 2016-10-15 DIAGNOSIS — M5137 Other intervertebral disc degeneration, lumbosacral region: Secondary | ICD-10-CM | POA: Diagnosis not present

## 2016-10-15 DIAGNOSIS — M5134 Other intervertebral disc degeneration, thoracic region: Secondary | ICD-10-CM | POA: Diagnosis not present

## 2016-10-15 DIAGNOSIS — M5124 Other intervertebral disc displacement, thoracic region: Secondary | ICD-10-CM | POA: Diagnosis not present

## 2016-10-15 DIAGNOSIS — M5135 Other intervertebral disc degeneration, thoracolumbar region: Secondary | ICD-10-CM | POA: Diagnosis not present

## 2016-10-15 DIAGNOSIS — M5136 Other intervertebral disc degeneration, lumbar region: Secondary | ICD-10-CM | POA: Diagnosis not present

## 2016-10-15 DIAGNOSIS — M9903 Segmental and somatic dysfunction of lumbar region: Secondary | ICD-10-CM | POA: Diagnosis not present

## 2016-10-15 DIAGNOSIS — M9904 Segmental and somatic dysfunction of sacral region: Secondary | ICD-10-CM | POA: Diagnosis not present

## 2016-10-20 DIAGNOSIS — I4891 Unspecified atrial fibrillation: Secondary | ICD-10-CM | POA: Diagnosis not present

## 2016-10-20 DIAGNOSIS — R001 Bradycardia, unspecified: Secondary | ICD-10-CM | POA: Diagnosis not present

## 2016-10-20 DIAGNOSIS — Z7901 Long term (current) use of anticoagulants: Secondary | ICD-10-CM | POA: Diagnosis not present

## 2016-10-20 DIAGNOSIS — E7800 Pure hypercholesterolemia, unspecified: Secondary | ICD-10-CM | POA: Diagnosis not present

## 2016-10-29 DIAGNOSIS — M5134 Other intervertebral disc degeneration, thoracic region: Secondary | ICD-10-CM | POA: Diagnosis not present

## 2016-10-29 DIAGNOSIS — M9905 Segmental and somatic dysfunction of pelvic region: Secondary | ICD-10-CM | POA: Diagnosis not present

## 2016-10-29 DIAGNOSIS — M9903 Segmental and somatic dysfunction of lumbar region: Secondary | ICD-10-CM | POA: Diagnosis not present

## 2016-10-29 DIAGNOSIS — M9904 Segmental and somatic dysfunction of sacral region: Secondary | ICD-10-CM | POA: Diagnosis not present

## 2016-10-29 DIAGNOSIS — M5135 Other intervertebral disc degeneration, thoracolumbar region: Secondary | ICD-10-CM | POA: Diagnosis not present

## 2016-10-29 DIAGNOSIS — M5137 Other intervertebral disc degeneration, lumbosacral region: Secondary | ICD-10-CM | POA: Diagnosis not present

## 2016-10-29 DIAGNOSIS — M9902 Segmental and somatic dysfunction of thoracic region: Secondary | ICD-10-CM | POA: Diagnosis not present

## 2016-10-29 DIAGNOSIS — M5136 Other intervertebral disc degeneration, lumbar region: Secondary | ICD-10-CM | POA: Diagnosis not present

## 2016-10-29 DIAGNOSIS — M5124 Other intervertebral disc displacement, thoracic region: Secondary | ICD-10-CM | POA: Diagnosis not present

## 2016-11-12 DIAGNOSIS — Z5181 Encounter for therapeutic drug level monitoring: Secondary | ICD-10-CM | POA: Diagnosis not present

## 2016-11-12 DIAGNOSIS — M5136 Other intervertebral disc degeneration, lumbar region: Secondary | ICD-10-CM | POA: Diagnosis not present

## 2016-11-12 DIAGNOSIS — Z7901 Long term (current) use of anticoagulants: Secondary | ICD-10-CM | POA: Diagnosis not present

## 2016-11-12 DIAGNOSIS — M5124 Other intervertebral disc displacement, thoracic region: Secondary | ICD-10-CM | POA: Diagnosis not present

## 2016-11-12 DIAGNOSIS — M5134 Other intervertebral disc degeneration, thoracic region: Secondary | ICD-10-CM | POA: Diagnosis not present

## 2016-11-12 DIAGNOSIS — M9904 Segmental and somatic dysfunction of sacral region: Secondary | ICD-10-CM | POA: Diagnosis not present

## 2016-11-12 DIAGNOSIS — I4891 Unspecified atrial fibrillation: Secondary | ICD-10-CM | POA: Diagnosis not present

## 2016-11-12 DIAGNOSIS — M9903 Segmental and somatic dysfunction of lumbar region: Secondary | ICD-10-CM | POA: Diagnosis not present

## 2016-11-12 DIAGNOSIS — M5135 Other intervertebral disc degeneration, thoracolumbar region: Secondary | ICD-10-CM | POA: Diagnosis not present

## 2016-11-12 DIAGNOSIS — M9905 Segmental and somatic dysfunction of pelvic region: Secondary | ICD-10-CM | POA: Diagnosis not present

## 2016-11-12 DIAGNOSIS — M9902 Segmental and somatic dysfunction of thoracic region: Secondary | ICD-10-CM | POA: Diagnosis not present

## 2016-11-12 DIAGNOSIS — M5137 Other intervertebral disc degeneration, lumbosacral region: Secondary | ICD-10-CM | POA: Diagnosis not present

## 2016-11-26 DIAGNOSIS — Z7901 Long term (current) use of anticoagulants: Secondary | ICD-10-CM | POA: Diagnosis not present

## 2016-11-26 DIAGNOSIS — M5135 Other intervertebral disc degeneration, thoracolumbar region: Secondary | ICD-10-CM | POA: Diagnosis not present

## 2016-11-26 DIAGNOSIS — M9902 Segmental and somatic dysfunction of thoracic region: Secondary | ICD-10-CM | POA: Diagnosis not present

## 2016-11-26 DIAGNOSIS — M9905 Segmental and somatic dysfunction of pelvic region: Secondary | ICD-10-CM | POA: Diagnosis not present

## 2016-11-26 DIAGNOSIS — I4891 Unspecified atrial fibrillation: Secondary | ICD-10-CM | POA: Diagnosis not present

## 2016-11-26 DIAGNOSIS — M5134 Other intervertebral disc degeneration, thoracic region: Secondary | ICD-10-CM | POA: Diagnosis not present

## 2016-11-26 DIAGNOSIS — Z5181 Encounter for therapeutic drug level monitoring: Secondary | ICD-10-CM | POA: Diagnosis not present

## 2016-11-26 DIAGNOSIS — M9903 Segmental and somatic dysfunction of lumbar region: Secondary | ICD-10-CM | POA: Diagnosis not present

## 2016-11-26 DIAGNOSIS — M9904 Segmental and somatic dysfunction of sacral region: Secondary | ICD-10-CM | POA: Diagnosis not present

## 2016-11-26 DIAGNOSIS — M5137 Other intervertebral disc degeneration, lumbosacral region: Secondary | ICD-10-CM | POA: Diagnosis not present

## 2016-11-26 DIAGNOSIS — M5124 Other intervertebral disc displacement, thoracic region: Secondary | ICD-10-CM | POA: Diagnosis not present

## 2016-11-26 DIAGNOSIS — M5136 Other intervertebral disc degeneration, lumbar region: Secondary | ICD-10-CM | POA: Diagnosis not present

## 2016-12-10 DIAGNOSIS — M9901 Segmental and somatic dysfunction of cervical region: Secondary | ICD-10-CM | POA: Diagnosis not present

## 2016-12-10 DIAGNOSIS — Z7901 Long term (current) use of anticoagulants: Secondary | ICD-10-CM | POA: Diagnosis not present

## 2016-12-10 DIAGNOSIS — M9923 Subluxation stenosis of neural canal of lumbar region: Secondary | ICD-10-CM | POA: Diagnosis not present

## 2016-12-10 DIAGNOSIS — M5134 Other intervertebral disc degeneration, thoracic region: Secondary | ICD-10-CM | POA: Diagnosis not present

## 2016-12-10 DIAGNOSIS — I4891 Unspecified atrial fibrillation: Secondary | ICD-10-CM | POA: Diagnosis not present

## 2016-12-10 DIAGNOSIS — M2021 Hallux rigidus, right foot: Secondary | ICD-10-CM | POA: Diagnosis not present

## 2016-12-10 DIAGNOSIS — M9903 Segmental and somatic dysfunction of lumbar region: Secondary | ICD-10-CM | POA: Diagnosis not present

## 2016-12-10 DIAGNOSIS — M5135 Other intervertebral disc degeneration, thoracolumbar region: Secondary | ICD-10-CM | POA: Diagnosis not present

## 2016-12-10 DIAGNOSIS — M9902 Segmental and somatic dysfunction of thoracic region: Secondary | ICD-10-CM | POA: Diagnosis not present

## 2016-12-10 DIAGNOSIS — M5033 Other cervical disc degeneration, cervicothoracic region: Secondary | ICD-10-CM | POA: Diagnosis not present

## 2016-12-10 DIAGNOSIS — M532X7 Spinal instabilities, lumbosacral region: Secondary | ICD-10-CM | POA: Diagnosis not present

## 2016-12-10 DIAGNOSIS — M9900 Segmental and somatic dysfunction of head region: Secondary | ICD-10-CM | POA: Diagnosis not present

## 2016-12-10 DIAGNOSIS — M791 Myalgia: Secondary | ICD-10-CM | POA: Diagnosis not present

## 2016-12-10 DIAGNOSIS — M5114 Intervertebral disc disorders with radiculopathy, thoracic region: Secondary | ICD-10-CM | POA: Diagnosis not present

## 2016-12-24 DIAGNOSIS — M9923 Subluxation stenosis of neural canal of lumbar region: Secondary | ICD-10-CM | POA: Diagnosis not present

## 2016-12-24 DIAGNOSIS — M5114 Intervertebral disc disorders with radiculopathy, thoracic region: Secondary | ICD-10-CM | POA: Diagnosis not present

## 2016-12-24 DIAGNOSIS — M9901 Segmental and somatic dysfunction of cervical region: Secondary | ICD-10-CM | POA: Diagnosis not present

## 2016-12-24 DIAGNOSIS — M5033 Other cervical disc degeneration, cervicothoracic region: Secondary | ICD-10-CM | POA: Diagnosis not present

## 2016-12-24 DIAGNOSIS — M9902 Segmental and somatic dysfunction of thoracic region: Secondary | ICD-10-CM | POA: Diagnosis not present

## 2016-12-24 DIAGNOSIS — M5134 Other intervertebral disc degeneration, thoracic region: Secondary | ICD-10-CM | POA: Diagnosis not present

## 2016-12-24 DIAGNOSIS — M9903 Segmental and somatic dysfunction of lumbar region: Secondary | ICD-10-CM | POA: Diagnosis not present

## 2016-12-24 DIAGNOSIS — M9900 Segmental and somatic dysfunction of head region: Secondary | ICD-10-CM | POA: Diagnosis not present

## 2016-12-24 DIAGNOSIS — Z7901 Long term (current) use of anticoagulants: Secondary | ICD-10-CM | POA: Diagnosis not present

## 2016-12-24 DIAGNOSIS — I4891 Unspecified atrial fibrillation: Secondary | ICD-10-CM | POA: Diagnosis not present

## 2016-12-24 DIAGNOSIS — M532X7 Spinal instabilities, lumbosacral region: Secondary | ICD-10-CM | POA: Diagnosis not present

## 2016-12-24 DIAGNOSIS — M2021 Hallux rigidus, right foot: Secondary | ICD-10-CM | POA: Diagnosis not present

## 2016-12-24 DIAGNOSIS — M5135 Other intervertebral disc degeneration, thoracolumbar region: Secondary | ICD-10-CM | POA: Diagnosis not present

## 2016-12-24 DIAGNOSIS — M791 Myalgia: Secondary | ICD-10-CM | POA: Diagnosis not present

## 2017-01-30 ENCOUNTER — Encounter: Payer: Self-pay | Admitting: Interventional Cardiology

## 2017-02-08 NOTE — Progress Notes (Signed)
Cardiology Office Note   Date:  02/09/2017   ID:  [name], DOB 14-Jan-1946, MRN [id number]  PCP:  No primary care provider on file.    Chief Complaint  Patient presents with  . Coronary Artery Disease    NO CP, SOB OR SWELLING.     Wt Readings from Last 3 Encounters:  02/09/17 187 lb 6.4 oz (85 kg)       History of Present Illness: [name] is a 71 y.o. male  who had CABG in 1996.  He was being treated for HTN at the time.  He was on vacation and was cleaning and felt severe chest pain with activity.  He failed an ETT.  He had a cath showing 3 vessel disease and had CABG x 3 in 1996 ( LIMA was used.).  Since then, he has not had a cath.  He continued rehab for an extended period of time.  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  He moved to [place] a few weeks ago.   Recently, he has not had any angina.  He sleeps in a recliner due to a chronic pulling feeling in his chest.    He uses a cane and walker to walk due to instability.  No recent falls.   Quit smoking 35 years ago.    He was diagnosed with a hypercoagulable state at the time of CABG and he has been on Coumadin since that time.  Nose bleed when INR increased.   He was found to have a markedly elevated fibrinopeptide A level, mild resistance to activated protein C and relative aspirin resistance with respect to ADP-induced platelet aggregation.   No recent stress test.    IMA and SVG from right leg used.   BP is in the 130-136/80 range at home.   He has lost 17 lbs recently while moving from [place].   He has not taken aspirin for the past 2 years with Coumadin.     Past Medical History:  Diagnosis Date  . 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 Cataracts, bilateral   . Dyslipidemia   . Hypertension   . Sinus bradycardia     Past Surgical History:  Procedure Laterality Date  . BYPASS GRAFT  1996  . CATARACT  EXTRACTION Right   . REFRACTIVE SURGERY Right   . SKIN BIOPSY    . UMBILICAL HERNIA REPAIR  2003     Current Outpatient Prescriptions  Medication Sig Dispense Refill  . amLODipine (NORVASC) 5 MG tablet Take 5 mg by mouth daily.    . Cholecalciferol (VITAMIN D-3) 5000 units TABS Take 1 tablet by mouth daily.    Marland Kitchen ezetimibe (ZETIA) 10 MG tablet Take 10 mg by mouth at bedtime.    . fenofibrate (TRICOR) 145 MG tablet Take 145 mg by mouth at bedtime.    . hydrochlorothiazide (HYDRODIURIL) 25 MG tablet Take 25 mg by mouth daily as needed.    Marland Kitchen losartan (COZAAR) 100 MG tablet Take 100 mg by mouth daily.    . rosuvastatin (CRESTOR) 40 MG tablet Take 40 mg by mouth at bedtime.    Marland Kitchen warfarin (COUMADIN) 5 MG tablet Take 5 mg by mouth daily at 6 PM.     No current facility-administered medications for this visit.     Allergies:   Patient has no known allergies.    Social History:  The patient  reports that he has quit smoking. He has never used smokeless tobacco. He reports that he drinks alcohol. He reports that he does not use drugs.   Family History:  The patient's family history includes AAA (abdominal aortic aneurysm) in his sister; Alzheimer's disease in his mother; Cancer in his father; Prostate cancer in his father.    ROS:  Please see the history of present illness.   Otherwise, review of systems are positive for back pain.   All other systems are reviewed and negative.    PHYSICAL EXAM: VS:  BP (!) 146/80 (BP Location: Right Arm, Patient Position: Sitting, Cuff Size: Normal)   Pulse 62   Ht 6\' 3"  (1.905 m)   Wt 187 lb 6.4 oz (85 kg)   SpO2 97%   BMI 23.42 kg/m  , BMI Body mass index is 23.42 kg/m. GEN: Well nourished, well developed, in no acute distress  HEENT: normal  Neck: no JVD, carotid bruits, or masses Cardiac: RRR; no murmurs, rubs, or gallops,no edema  Respiratory:  clear to auscultation bilaterally, normal work of breathing GI: soft, nontender, nondistended, +  BS MS: no deformity or atrophy  Skin: warm and dry, no rash Neuro:  Strength and sensation are intact Psych: euthymic mood, full affect   EKG:   The ekg ordered today demonstrates NSR, IVCD, no ST changes   Recent Labs: No results found for requested labs within last 8760 hours.   Lipid Panel No results found for: CHOL, TRIG, HDL, CHOLHDL, VLDL, LDLCALC, LDLDIRECT   Other studies Reviewed: Additional studies/ records that were reviewed today with results demonstrating: CABG report reviewed: LIMA to LAD. SVG to diagonal, SVG to PDA In 1996.   ASSESSMENT AND PLAN:  1. CAD:  No angina. Continue aggressive secondary prevention.  2. Hyperlipidemia: THere is an issue for him getting Crestor.  He will give [place] the letter from them stating what the accepted alternative would be.  3. Anticoagulated: Checked INR today in the office, was 1.2.  Seen by the COumadin clinic and dose of Coumadin was adjusted.  He is on anticoagulation for a hypercoaguable state discovered at the time of CABG. he was found to have a markedly elevated fibrinopeptide A level, mild resistance to activated protein C and relative aspirin resistance with respect to ADP-induced platelet aggregation. 4. He will need a PMD.    Current medicines are reviewed at length with the patient today.  The patient concerns regarding his medicines were addressed.  The following changes have been made:  No change  Labs/ tests ordered today include: labs for tomorrow No orders of the defined types were placed in this encounter.   Recommend 150 minutes/week of aerobic exercise Low fat, low carb, high fiber diet recommended  Disposition:   FU in 6 months   Signed, Larae Grooms, MD  02/09/2017 2:39 PM    [address], [place], [place]  [address] Phone: [phone]; Fax: [phone]

## 2017-02-09 ENCOUNTER — Encounter: Payer: Self-pay | Admitting: Interventional Cardiology

## 2017-02-09 ENCOUNTER — Encounter (INDEPENDENT_AMBULATORY_CARE_PROVIDER_SITE_OTHER): Payer: Self-pay

## 2017-02-09 ENCOUNTER — Ambulatory Visit (INDEPENDENT_AMBULATORY_CARE_PROVIDER_SITE_OTHER): Payer: Medicare Other | Admitting: Interventional Cardiology

## 2017-02-09 ENCOUNTER — Ambulatory Visit (INDEPENDENT_AMBULATORY_CARE_PROVIDER_SITE_OTHER): Payer: Medicare Other | Admitting: *Deleted

## 2017-02-09 VITALS — BP 146/80 | HR 62 | Ht 75.0 in | Wt 187.4 lb

## 2017-02-09 DIAGNOSIS — I2510 Atherosclerotic heart disease of native coronary artery without angina pectoris: Secondary | ICD-10-CM | POA: Diagnosis not present

## 2017-02-09 DIAGNOSIS — Z5181 Encounter for therapeutic drug level monitoring: Secondary | ICD-10-CM

## 2017-02-09 DIAGNOSIS — Z7901 Long term (current) use of anticoagulants: Secondary | ICD-10-CM | POA: Diagnosis not present

## 2017-02-09 DIAGNOSIS — E782 Mixed hyperlipidemia: Secondary | ICD-10-CM

## 2017-02-09 DIAGNOSIS — D6859 Other primary thrombophilia: Secondary | ICD-10-CM | POA: Diagnosis not present

## 2017-02-09 LAB — POCT INR: INR: 1.2

## 2017-02-09 NOTE — Patient Instructions (Signed)

## 2017-02-09 NOTE — Patient Instructions (Signed)
**Note De-Identified  Obfuscation** Medication Instructions:  Same-no changes  Labwork: CMET, CBC and Lipids tomorrow anytime between 7:30 and 5:00. Please do not eat or drink after midnight the night before labs are drawn.  Testing/Procedures: None  Follow-Up: Your physician wants you to follow-up in: 6 months. You will receive a reminder letter in the mail two months in advance. If you don't receive a letter, please call our office to schedule the follow-up appointment.     If you need a refill on your cardiac medications before your next appointment, please call your pharmacy.

## 2017-02-10 ENCOUNTER — Other Ambulatory Visit (INDEPENDENT_AMBULATORY_CARE_PROVIDER_SITE_OTHER): Payer: Medicare Other

## 2017-02-10 DIAGNOSIS — I2510 Atherosclerotic heart disease of native coronary artery without angina pectoris: Secondary | ICD-10-CM

## 2017-02-10 DIAGNOSIS — Z7901 Long term (current) use of anticoagulants: Secondary | ICD-10-CM | POA: Diagnosis not present

## 2017-02-10 DIAGNOSIS — R3911 Hesitancy of micturition: Secondary | ICD-10-CM | POA: Insufficient documentation

## 2017-02-10 DIAGNOSIS — E782 Mixed hyperlipidemia: Secondary | ICD-10-CM

## 2017-02-10 LAB — CBC WITH DIFFERENTIAL/PLATELET
BASOS ABS: 0 10*3/uL (ref 0.0–0.2)
BASOS: 1 %
EOS (ABSOLUTE): 0.1 10*3/uL (ref 0.0–0.4)
Eos: 4 %
Hematocrit: 42.7 % (ref 37.5–51.0)
Hemoglobin: 14.3 g/dL (ref 13.0–17.7)
IMMATURE GRANS (ABS): 0 10*3/uL (ref 0.0–0.1)
IMMATURE GRANULOCYTES: 0 %
LYMPHS: 32 %
Lymphocytes Absolute: 1.1 10*3/uL (ref 0.7–3.1)
MCH: 31.4 pg (ref 26.6–33.0)
MCHC: 33.5 g/dL (ref 31.5–35.7)
MCV: 94 fL (ref 79–97)
Monocytes Absolute: 0.3 10*3/uL (ref 0.1–0.9)
Monocytes: 9 %
NEUTROS ABS: 1.9 10*3/uL (ref 1.4–7.0)
NEUTROS PCT: 54 %
PLATELETS: 220 10*3/uL (ref 150–379)
RBC: 4.56 x10E6/uL (ref 4.14–5.80)
RDW: 14.4 % (ref 12.3–15.4)
WBC: 3.4 10*3/uL (ref 3.4–10.8)

## 2017-02-10 LAB — COMPREHENSIVE METABOLIC PANEL
A/G RATIO: 2.1 (ref 1.2–2.2)
ALT: 21 IU/L (ref 0–44)
AST: 25 IU/L (ref 0–40)
Albumin: 4.9 g/dL — ABNORMAL HIGH (ref 3.6–4.8)
Alkaline Phosphatase: 56 IU/L (ref 39–117)
BILIRUBIN TOTAL: 0.6 mg/dL (ref 0.0–1.2)
BUN/Creatinine Ratio: 12 (ref 10–24)
BUN: 11 mg/dL (ref 8–27)
CHLORIDE: 103 mmol/L (ref 96–106)
CO2: 25 mmol/L (ref 18–29)
Calcium: 11.3 mg/dL — ABNORMAL HIGH (ref 8.6–10.2)
Creatinine, Ser: 0.93 mg/dL (ref 0.76–1.27)
GFR calc Af Amer: 96 mL/min/{1.73_m2} (ref >59)
GFR, EST NON AFRICAN AMERICAN: 83 mL/min/{1.73_m2} (ref >59)
GLOBULIN, TOTAL: 2.3 g/dL (ref 1.5–4.5)
Glucose: 89 mg/dL (ref 65–99)
POTASSIUM: 4.4 mmol/L (ref 3.5–5.2)
SODIUM: 145 mmol/L — AB (ref 134–144)
Total Protein: 7.2 g/dL (ref 6.0–8.5)

## 2017-02-10 LAB — LIPID PANEL
Chol/HDL Ratio: 2.1 ratio units (ref 0.0–5.0)
Cholesterol, Total: 118 mg/dL (ref 100–199)
HDL: 56 mg/dL (ref >39)
LDL Calculated: 45 mg/dL (ref 0–99)
Triglycerides: 84 mg/dL (ref 0–149)
VLDL Cholesterol Cal: 17 mg/dL (ref 5–40)

## 2017-02-10 NOTE — Addendum Note (Signed)
Addended by: Stephannie Peters on: 02/10/2017 08:47 AM   Modules accepted: Orders

## 2017-02-13 ENCOUNTER — Other Ambulatory Visit: Payer: Self-pay

## 2017-02-13 MED ORDER — ROSUVASTATIN CALCIUM 40 MG PO TABS: 40.0000 mg | ORAL_TABLET | Freq: Every day | ORAL | 3 refills | Status: DC

## 2017-02-16 ENCOUNTER — Ambulatory Visit (INDEPENDENT_AMBULATORY_CARE_PROVIDER_SITE_OTHER): Payer: Medicare Other | Admitting: Pharmacist

## 2017-02-16 DIAGNOSIS — I2510 Atherosclerotic heart disease of native coronary artery without angina pectoris: Secondary | ICD-10-CM

## 2017-02-16 DIAGNOSIS — Z5181 Encounter for therapeutic drug level monitoring: Secondary | ICD-10-CM | POA: Diagnosis not present

## 2017-02-16 DIAGNOSIS — D6859 Other primary thrombophilia: Secondary | ICD-10-CM | POA: Diagnosis not present

## 2017-02-16 LAB — POCT INR: INR: 2.9

## 2017-03-02 DIAGNOSIS — M5136 Other intervertebral disc degeneration, lumbar region: Secondary | ICD-10-CM | POA: Diagnosis not present

## 2017-03-02 DIAGNOSIS — M5134 Other intervertebral disc degeneration, thoracic region: Secondary | ICD-10-CM | POA: Diagnosis not present

## 2017-03-02 DIAGNOSIS — M9903 Segmental and somatic dysfunction of lumbar region: Secondary | ICD-10-CM | POA: Diagnosis not present

## 2017-03-02 DIAGNOSIS — M9905 Segmental and somatic dysfunction of pelvic region: Secondary | ICD-10-CM | POA: Diagnosis not present

## 2017-03-02 DIAGNOSIS — M9902 Segmental and somatic dysfunction of thoracic region: Secondary | ICD-10-CM | POA: Diagnosis not present

## 2017-03-02 DIAGNOSIS — M5431 Sciatica, right side: Secondary | ICD-10-CM | POA: Diagnosis not present

## 2017-03-04 DIAGNOSIS — M5134 Other intervertebral disc degeneration, thoracic region: Secondary | ICD-10-CM | POA: Diagnosis not present

## 2017-03-04 DIAGNOSIS — M9905 Segmental and somatic dysfunction of pelvic region: Secondary | ICD-10-CM | POA: Diagnosis not present

## 2017-03-04 DIAGNOSIS — M5431 Sciatica, right side: Secondary | ICD-10-CM | POA: Diagnosis not present

## 2017-03-04 DIAGNOSIS — M9902 Segmental and somatic dysfunction of thoracic region: Secondary | ICD-10-CM | POA: Diagnosis not present

## 2017-03-04 DIAGNOSIS — M5136 Other intervertebral disc degeneration, lumbar region: Secondary | ICD-10-CM | POA: Diagnosis not present

## 2017-03-04 DIAGNOSIS — M9903 Segmental and somatic dysfunction of lumbar region: Secondary | ICD-10-CM | POA: Diagnosis not present

## 2017-03-09 ENCOUNTER — Ambulatory Visit (INDEPENDENT_AMBULATORY_CARE_PROVIDER_SITE_OTHER): Payer: Medicare Other | Admitting: *Deleted

## 2017-03-09 DIAGNOSIS — M5134 Other intervertebral disc degeneration, thoracic region: Secondary | ICD-10-CM | POA: Diagnosis not present

## 2017-03-09 DIAGNOSIS — M5136 Other intervertebral disc degeneration, lumbar region: Secondary | ICD-10-CM | POA: Diagnosis not present

## 2017-03-09 DIAGNOSIS — I2510 Atherosclerotic heart disease of native coronary artery without angina pectoris: Secondary | ICD-10-CM

## 2017-03-09 DIAGNOSIS — M9903 Segmental and somatic dysfunction of lumbar region: Secondary | ICD-10-CM | POA: Diagnosis not present

## 2017-03-09 DIAGNOSIS — M5431 Sciatica, right side: Secondary | ICD-10-CM | POA: Diagnosis not present

## 2017-03-09 DIAGNOSIS — Z5181 Encounter for therapeutic drug level monitoring: Secondary | ICD-10-CM | POA: Diagnosis not present

## 2017-03-09 DIAGNOSIS — M9905 Segmental and somatic dysfunction of pelvic region: Secondary | ICD-10-CM | POA: Diagnosis not present

## 2017-03-09 DIAGNOSIS — D6859 Other primary thrombophilia: Secondary | ICD-10-CM

## 2017-03-09 DIAGNOSIS — M9902 Segmental and somatic dysfunction of thoracic region: Secondary | ICD-10-CM | POA: Diagnosis not present

## 2017-03-09 LAB — POCT INR: INR: 1.5

## 2017-03-12 DIAGNOSIS — M5136 Other intervertebral disc degeneration, lumbar region: Secondary | ICD-10-CM | POA: Diagnosis not present

## 2017-03-12 DIAGNOSIS — M5134 Other intervertebral disc degeneration, thoracic region: Secondary | ICD-10-CM | POA: Diagnosis not present

## 2017-03-12 DIAGNOSIS — M9905 Segmental and somatic dysfunction of pelvic region: Secondary | ICD-10-CM | POA: Diagnosis not present

## 2017-03-12 DIAGNOSIS — M9902 Segmental and somatic dysfunction of thoracic region: Secondary | ICD-10-CM | POA: Diagnosis not present

## 2017-03-12 DIAGNOSIS — M9903 Segmental and somatic dysfunction of lumbar region: Secondary | ICD-10-CM | POA: Diagnosis not present

## 2017-03-12 DIAGNOSIS — M5431 Sciatica, right side: Secondary | ICD-10-CM | POA: Diagnosis not present

## 2017-03-18 DIAGNOSIS — M5136 Other intervertebral disc degeneration, lumbar region: Secondary | ICD-10-CM | POA: Diagnosis not present

## 2017-03-18 DIAGNOSIS — M9903 Segmental and somatic dysfunction of lumbar region: Secondary | ICD-10-CM | POA: Diagnosis not present

## 2017-03-18 DIAGNOSIS — M9902 Segmental and somatic dysfunction of thoracic region: Secondary | ICD-10-CM | POA: Diagnosis not present

## 2017-03-18 DIAGNOSIS — M9905 Segmental and somatic dysfunction of pelvic region: Secondary | ICD-10-CM | POA: Diagnosis not present

## 2017-03-18 DIAGNOSIS — M5431 Sciatica, right side: Secondary | ICD-10-CM | POA: Diagnosis not present

## 2017-03-18 DIAGNOSIS — M5134 Other intervertebral disc degeneration, thoracic region: Secondary | ICD-10-CM | POA: Diagnosis not present

## 2017-03-20 ENCOUNTER — Ambulatory Visit (INDEPENDENT_AMBULATORY_CARE_PROVIDER_SITE_OTHER): Payer: Medicare Other | Admitting: *Deleted

## 2017-03-20 DIAGNOSIS — Z5181 Encounter for therapeutic drug level monitoring: Secondary | ICD-10-CM

## 2017-03-20 DIAGNOSIS — D6859 Other primary thrombophilia: Secondary | ICD-10-CM

## 2017-03-20 DIAGNOSIS — I2510 Atherosclerotic heart disease of native coronary artery without angina pectoris: Secondary | ICD-10-CM

## 2017-03-20 LAB — POCT INR: INR: 2.9

## 2017-03-23 DIAGNOSIS — M9905 Segmental and somatic dysfunction of pelvic region: Secondary | ICD-10-CM | POA: Diagnosis not present

## 2017-03-23 DIAGNOSIS — M5136 Other intervertebral disc degeneration, lumbar region: Secondary | ICD-10-CM | POA: Diagnosis not present

## 2017-03-23 DIAGNOSIS — M9903 Segmental and somatic dysfunction of lumbar region: Secondary | ICD-10-CM | POA: Diagnosis not present

## 2017-03-23 DIAGNOSIS — M5431 Sciatica, right side: Secondary | ICD-10-CM | POA: Diagnosis not present

## 2017-03-23 DIAGNOSIS — M9902 Segmental and somatic dysfunction of thoracic region: Secondary | ICD-10-CM | POA: Diagnosis not present

## 2017-03-23 DIAGNOSIS — M5134 Other intervertebral disc degeneration, thoracic region: Secondary | ICD-10-CM | POA: Diagnosis not present

## 2017-03-25 DIAGNOSIS — M5431 Sciatica, right side: Secondary | ICD-10-CM | POA: Diagnosis not present

## 2017-03-25 DIAGNOSIS — M5134 Other intervertebral disc degeneration, thoracic region: Secondary | ICD-10-CM | POA: Diagnosis not present

## 2017-03-25 DIAGNOSIS — M9905 Segmental and somatic dysfunction of pelvic region: Secondary | ICD-10-CM | POA: Diagnosis not present

## 2017-03-25 DIAGNOSIS — M5136 Other intervertebral disc degeneration, lumbar region: Secondary | ICD-10-CM | POA: Diagnosis not present

## 2017-03-25 DIAGNOSIS — M9903 Segmental and somatic dysfunction of lumbar region: Secondary | ICD-10-CM | POA: Diagnosis not present

## 2017-03-25 DIAGNOSIS — M9902 Segmental and somatic dysfunction of thoracic region: Secondary | ICD-10-CM | POA: Diagnosis not present

## 2017-03-27 ENCOUNTER — Encounter: Payer: Self-pay | Admitting: Family Medicine

## 2017-03-27 ENCOUNTER — Telehealth: Payer: Self-pay

## 2017-03-27 ENCOUNTER — Ambulatory Visit (INDEPENDENT_AMBULATORY_CARE_PROVIDER_SITE_OTHER): Payer: Medicare Other | Admitting: Family Medicine

## 2017-03-27 VITALS — BP 142/80 | HR 77 | Resp 12 | Ht 75.0 in | Wt 191.2 lb

## 2017-03-27 DIAGNOSIS — R2681 Unsteadiness on feet: Secondary | ICD-10-CM

## 2017-03-27 DIAGNOSIS — L989 Disorder of the skin and subcutaneous tissue, unspecified: Secondary | ICD-10-CM

## 2017-03-27 DIAGNOSIS — I2510 Atherosclerotic heart disease of native coronary artery without angina pectoris: Secondary | ICD-10-CM

## 2017-03-27 DIAGNOSIS — R531 Weakness: Secondary | ICD-10-CM

## 2017-03-27 DIAGNOSIS — R202 Paresthesia of skin: Secondary | ICD-10-CM

## 2017-03-27 DIAGNOSIS — R200 Anesthesia of skin: Secondary | ICD-10-CM | POA: Diagnosis not present

## 2017-03-27 DIAGNOSIS — R350 Frequency of micturition: Secondary | ICD-10-CM

## 2017-03-27 LAB — URINALYSIS, ROUTINE W REFLEX MICROSCOPIC
Bilirubin Urine: NEGATIVE
Hgb urine dipstick: NEGATIVE
KETONES UR: NEGATIVE
Leukocytes, UA: NEGATIVE
NITRITE: NEGATIVE
RBC / HPF: NONE SEEN (ref 0–?)
SPECIFIC GRAVITY, URINE: 1.015 (ref 1.000–1.030)
TOTAL PROTEIN, URINE-UPE24: NEGATIVE
URINE GLUCOSE: NEGATIVE
UROBILINOGEN UA: 0.2 (ref 0.0–1.0)
WBC UA: NONE SEEN (ref 0–?)
pH: 6.5 (ref 5.0–8.0)

## 2017-03-27 LAB — PSA: PSA: 0.5 ng/mL (ref 0.10–4.00)

## 2017-03-27 LAB — CK: CK TOTAL: 96 U/L (ref 7–232)

## 2017-03-27 LAB — VITAMIN B12: VITAMIN B 12: 275 pg/mL (ref 211–911)

## 2017-03-27 LAB — VITAMIN D 25 HYDROXY (VIT D DEFICIENCY, FRACTURES): VITD: 107.97 ng/mL (ref 30.00–100.00)

## 2017-03-27 NOTE — Telephone Encounter (Signed)
Called and left voicemail per DPR to let patient know to stop taking his Vitamin D because the level is too high. Advised to call back with any questions.

## 2017-03-27 NOTE — Progress Notes (Signed)
Pre visit review using our clinic review tool, if applicable. No additional management support is needed unless otherwise documented below in the visit note. 

## 2017-03-27 NOTE — Progress Notes (Signed)
HPI:   [name] is a 71 y.o. male, who is here today with his wife to establish care.  Former PCP: Dr [name] in [place] Last preventive routine visit: 11-11/2016.  Chronic medical problems: HTN,HLD,CAD, vit D deficiency,and OA among some.  He is on Coumadin, which he has taken for about 22 years, Hx of "clotting disorder", no Hx thrombotic episode. He sees Dr [name], last OV 02/09/17.   Concerns today:   Tingling,numbness,and generalized weakness:  Reporting years of intermittent weakness and tinging with numbness from waist down. He has seen neurologists in the past and according to pt, work-up that included labs and lumbar MRI x 2 were done and the only abnormality was low Vit D at 7.0.   Since 08/2016 symptoms have extend to upper extremities, neck muscle pain, like grabbing" sensation that graduated to mid back.Alleviated by applying local heat and ice. Numbness and tingling on upper extremities, like "electricity", intermittent. Episodes seems to be exacerbated by physical activity. If he rests most of the day episodes are less frequent. Associated with tinnitus and "shaking" sensation. Denies LOC.  He does not drive because cannot feel the pedal. Occipital pressure headache and occasionally frontal, mild to moderate. No associated fever,chills,or skin rash. Denies MS changes, seizure like episodes.  He has used a cane for years,now he has a walker. Problem getting worse.  He follows with Chiropractor, Dr [name], who referred him to neurologists, appt 05/2017; to rule out an "organic problem."  No Hx of occupational chemical exposure.  Noted elevated Ca++ at 11.3 in 02/2017. Hx of vit D deficiency,he takes Vit D 5000 U daily.   Lab Results  Component Value Date   CREATININE 0.93 02/10/2017   BUN 11 02/10/2017   NA 145 (H) 02/10/2017   K 4.4 02/10/2017   CL 103 02/10/2017   CO2 25 02/10/2017   He has noted urinary urgency getting worse, denies  hematuria,foam in urine, decreased urine stream.  Report colonoscopy as current, 2010. Former smoker. Denies alcohol abuse.   HTN: BP mildly elevated today. BP reading at home:130's/80. Currently he is on Cozaar 100 mg , Amlodipine 5 mg, and HCTZ 25 mg daily.  Denies visual changes, chest pain, dyspnea, palpitation, claudication,or edema.  HLD: He is on Tricor,Crestor,and Zetia. He follows a healthy diet. + CAD, s/p CABG in 1996.  According to wife,this regimen combination is the "only" one that has kept his cholesterol well controlled.   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   Review of Systems  Constitutional: Positive for fatigue and unexpected weight change (30 Lb since 08/2016). Negative for appetite change and fever.  HENT: Positive for tinnitus. Negative for hearing loss, mouth sores, nosebleeds, sore throat and trouble swallowing.   Eyes: Negative for redness and visual disturbance.  Respiratory: Negative for cough, shortness of breath and wheezing.   Cardiovascular: Negative for chest pain, palpitations and leg swelling.  Gastrointestinal: Negative for abdominal pain, nausea and vomiting.       No changes in bowel habits.   Endocrine: Negative for cold intolerance, heat intolerance, polydipsia, polyphagia and polyuria.  Genitourinary: Positive for frequency. Negative for decreased urine volume,  dysuria and hematuria.  Musculoskeletal: Positive for arthralgias, gait problem and neck pain.  Skin: Positive for rash. Negative for pallor.  Neurological: Positive for tremors, weakness, light-headedness, numbness and headaches. Negative for seizures, syncope, facial asymmetry and speech difficulty.    Hematological: Negative for adenopathy. Does not bruise/bleed easily.  Psychiatric/Behavioral: Negative for confusion and hallucinations. The patient is nervous/anxious.       Current Outpatient Prescriptions on File Prior to Visit  Medication Sig Dispense Refill  . amLODipine (NORVASC) 5 MG tablet Take 5 mg by mouth daily.    . Cholecalciferol (VITAMIN D-3) 5000 units TABS Take 1 tablet by mouth daily.    Marland Kitchen ezetimibe (ZETIA) 10 MG tablet Take 10 mg by mouth at bedtime.    . fenofibrate (TRICOR) 145 MG tablet Take 145 mg by mouth at bedtime.    . hydrochlorothiazide (HYDRODIURIL) 25 MG tablet Take 25 mg by mouth daily as needed.    Marland Kitchen losartan (COZAAR) 100 MG tablet Take 100 mg by mouth daily.    . rosuvastatin (CRESTOR) 40 MG tablet Take 1 tablet (40 mg total) by mouth at bedtime. 90 tablet 3  . warfarin (COUMADIN) 5 MG tablet Take 5 mg by mouth daily at 6 PM.     No current facility-administered medications on file prior to visit.      Past Medical History:  Diagnosis Date  . Allergy   . Arthritis   . 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 Cataracts, bilateral   . Chicken pox   . Dyslipidemia   . Hyperlipidemia   . Hypertension   . Sinus bradycardia    No Known Allergies  Family History  Problem Relation Age of Onset  . Alzheimer's disease Mother   . Cancer Father     prostate  . Prostate cancer Father   . AAA (abdominal aortic aneurysm) Sister   . Hypertension Maternal Grandmother   . Hypertension Paternal Grandmother     Social History   Social History  . Marital status: Married    Spouse [name]  . Number of children: 2  . Years of education: COLLEGE   Occupational History  . RETIRED    Social History Main Topics  . Smoking status: Former Research scientist (life sciences)  . Smokeless tobacco: Never Used  . Alcohol use Yes  . Drug use: No  . Sexual activity: Not Asked   Other Topics Concern  . None   Social History Narrative  . None    Vitals:    03/27/17 1000  BP: (!) 142/80  Pulse: 77  Resp: 12   O2 sat at RA 98% Body mass index is 23.9 kg/m.   Physical Exam  Nursing note and vitals reviewed. Constitutional: He is oriented to person, place, and time. He appears well-developed and well-nourished. No distress.  HENT:  Head: Atraumatic.  Mouth/Throat: Oropharynx is clear and moist and mucous membranes are normal.  Eyes: Conjunctivae and EOM are normal. Pupils are equal, round, and reactive to light.  Neck: No tracheal deviation present. No thyroid mass and no thyromegaly present.  Cardiovascular: Normal rate and regular rhythm.   Murmur (? soft SEM RUSB) heard. Pulses:      Dorsalis pedis pulses are 2+ on the right side, and 2+ on the left side.  Respiratory: Effort normal and breath sounds normal. No respiratory distress.  GI: Soft. He exhibits no mass. There is no hepatomegaly. There is no tenderness.  Musculoskeletal: He exhibits no edema or tenderness.  Lymphadenopathy:    He has no cervical adenopathy.  Neurological: He is alert and oriented to person, place, and time. He displays tremor (Minimal head and hand tremor.). No cranial nerve deficit. Gait abnormal.  Reflex Scores:      Bicep reflexes are 2+ on the right side and 2+ on the left side.      Patellar reflexes are 2+ on the right side and 2+ on the left side. Wide base unstable gait,assisted with a cane. Generalized muscle atrophy. Pronator drift negative.  Skin: Skin is warm. Lesion and rash noted. Rash is macular. Rash is not vesicular.     Fronto-parietal scalp with erythematous macular,mildly scaly lesions, 1-2 cm. Lesion of concern mid fronto parietal, 2-3 cm, erythematous,no tender, thick crusty area covering lesion partially.  Psychiatric: His mood appears anxious.  Fairly groomed, good eye contact.      ASSESSMENT AND PLAN:    Nadeem was seen today for establish care.  Diagnoses and all orders for this visit:  Weakness generalized  No  focal weakness appreciated on exam today. We discussed some side effects of statin meds and the risk of interaction when combined.  He may be a good candidate for PCSK9 inhibitors.  -     CK -     MR Brain Wo Contrast; Future  Numbness and tingling  Appt with neurologists have been arranged. We discussed etiologies to consider, ? Systemic illness. Reporting some work up done by former [employer] when symptoms were limited to LE's. Further recommendations will be given according to labs/imaging results.  -     Vitamin B12 -     MR Brain Wo Contrast; Future  Hypercalcemia  Possible etiologies discussed, ? Vit D overdose to be considered among others including malignancy. Further recommendations will be given according to lab results. Instructed about warning signs.   -     VITAMIN D 25 Hydroxy (Vit-D Deficiency, Fractures) -     Vitamin B12 -     Urinalysis, Routine w reflex microscopic -     CK -     PTH, intact and calcium -     Calcium, ionized -     MR Brain Wo Contrast; Future  Urine frequency  Chronic but reporting some worsening symptoms. No gross hematuria. ? BPH.  -     PSA       -     Urinalysis, Routine w reflex microscopic  Unstable gait  Fall prevention discussed. PT recommended.   -     Ambulatory referral to Physical Therapy  Lesion of skin of scalp  BCC Vs SCC. Sun screening and mechanical protection. Derma appt will be arranged.  -     Ambulatory referral to Dermatology        Assia Meanor G. [place], MD  [facility]. [place] office.

## 2017-03-27 NOTE — Patient Instructions (Signed)
A few things to remember from today's visit:   Weakness generalized - Plan: CK, MR Brain Wo Contrast  Numbness and tingling - Plan: Vitamin B12, MR Brain Wo Contrast  Hypercalcemia - Plan: VITAMIN D 25 Hydroxy (Vit-D Deficiency, Fractures), Vitamin B12, Urinalysis, Routine w reflex microscopic, CK, PTH, intact and calcium, Calcium, ionized, MR Brain Wo Contrast  Urine frequency - Plan: PSA   We have ordered labs or studies at this visit.  It can take up to 1-2 weeks for results and processing. IF results require follow up or explanation, we will call you with instructions. Clinically stable results will be released to your [facility]. If you have not heard from [place] or cannot find your results in [facility] in 2 weeks please contact our office at [phone].  If you are not yet signed up for [facility], please consider signing up    Please be sure medication list is accurate. If a new problem present, please set up appointment sooner than planned today.

## 2017-03-28 LAB — CALCIUM, IONIZED: CALCIUM ION: 6 mg/dL — AB (ref 4.8–5.6)

## 2017-03-30 ENCOUNTER — Encounter: Payer: Self-pay | Admitting: Family Medicine

## 2017-03-30 DIAGNOSIS — M5136 Other intervertebral disc degeneration, lumbar region: Secondary | ICD-10-CM | POA: Diagnosis not present

## 2017-03-30 DIAGNOSIS — M5134 Other intervertebral disc degeneration, thoracic region: Secondary | ICD-10-CM | POA: Diagnosis not present

## 2017-03-30 DIAGNOSIS — M9905 Segmental and somatic dysfunction of pelvic region: Secondary | ICD-10-CM | POA: Diagnosis not present

## 2017-03-30 DIAGNOSIS — M9903 Segmental and somatic dysfunction of lumbar region: Secondary | ICD-10-CM | POA: Diagnosis not present

## 2017-03-30 DIAGNOSIS — M5431 Sciatica, right side: Secondary | ICD-10-CM | POA: Diagnosis not present

## 2017-03-30 DIAGNOSIS — M9902 Segmental and somatic dysfunction of thoracic region: Secondary | ICD-10-CM | POA: Diagnosis not present

## 2017-03-30 LAB — PTH, INTACT AND CALCIUM
Calcium: 11.3 mg/dL — ABNORMAL HIGH (ref 8.6–10.3)
PTH: 17 pg/mL (ref 14–64)

## 2017-03-31 ENCOUNTER — Ambulatory Visit: Payer: Medicare Other | Attending: Family Medicine | Admitting: Physical Therapy

## 2017-03-31 DIAGNOSIS — R2689 Other abnormalities of gait and mobility: Secondary | ICD-10-CM | POA: Diagnosis not present

## 2017-03-31 DIAGNOSIS — R2681 Unsteadiness on feet: Secondary | ICD-10-CM | POA: Diagnosis not present

## 2017-03-31 DIAGNOSIS — M6281 Muscle weakness (generalized): Secondary | ICD-10-CM | POA: Insufficient documentation

## 2017-03-31 DIAGNOSIS — R279 Unspecified lack of coordination: Secondary | ICD-10-CM | POA: Insufficient documentation
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
## 2017-04-01 DIAGNOSIS — M5431 Sciatica, right side: Secondary | ICD-10-CM | POA: Diagnosis not present

## 2017-04-01 DIAGNOSIS — M5134 Other intervertebral disc degeneration, thoracic region: Secondary | ICD-10-CM | POA: Diagnosis not present

## 2017-04-01 DIAGNOSIS — M9903 Segmental and somatic dysfunction of lumbar region: Secondary | ICD-10-CM | POA: Diagnosis not present

## 2017-04-01 DIAGNOSIS — M5136 Other intervertebral disc degeneration, lumbar region: Secondary | ICD-10-CM | POA: Diagnosis not present

## 2017-04-01 DIAGNOSIS — M9902 Segmental and somatic dysfunction of thoracic region: Secondary | ICD-10-CM | POA: Diagnosis not present

## 2017-04-01 DIAGNOSIS — M9905 Segmental and somatic dysfunction of pelvic region: Secondary | ICD-10-CM | POA: Diagnosis not present

## 2017-04-02 ENCOUNTER — Ambulatory Visit (INDEPENDENT_AMBULATORY_CARE_PROVIDER_SITE_OTHER): Payer: Medicare Other | Admitting: Pharmacist

## 2017-04-02 DIAGNOSIS — D6859 Other primary thrombophilia: Secondary | ICD-10-CM

## 2017-04-02 DIAGNOSIS — I2510 Atherosclerotic heart disease of native coronary artery without angina pectoris: Secondary | ICD-10-CM

## 2017-04-02 DIAGNOSIS — Z5181 Encounter for therapeutic drug level monitoring: Secondary | ICD-10-CM

## 2017-04-02 LAB — POCT INR: INR: 2.9

## 2017-04-03 ENCOUNTER — Ambulatory Visit: Payer: Medicare Other | Admitting: Physical Therapy

## 2017-04-03 ENCOUNTER — Encounter: Payer: Self-pay | Admitting: Physical Therapy

## 2017-04-03 DIAGNOSIS — R279 Unspecified lack of coordination: Secondary | ICD-10-CM

## 2017-04-03 DIAGNOSIS — M6281 Muscle weakness (generalized): Secondary | ICD-10-CM | POA: Diagnosis not present

## 2017-04-03 DIAGNOSIS — R2681 Unsteadiness on feet: Secondary | ICD-10-CM | POA: Diagnosis not present

## 2017-04-03 DIAGNOSIS — R2689 Other abnormalities of gait and mobility: Secondary | ICD-10-CM | POA: Diagnosis not present

## 2017-04-03 NOTE — Therapy (Signed)
[facility] [address] [place], [place], [address] Phone: [phone]   Fax:  [phone]  Physical Therapy Treatment  Patient Details  [name] MRN: [id number] Date of Birth: 06-21-1946 Referring Provider: Dr.  Martinique  Encounter Date: 04/03/2017      PT End of Session - 04/03/17 0927    Visit Number 2   Number of Visits 10   Date for PT Re-Evaluation 05/26/17   Authorization Type  G code; KX modifiers at visit 15   PT Start Time 0921   PT Stop Time 1010   PT Time Calculation (min) 49 min   Activity Tolerance Patient tolerated treatment well   Behavior During Therapy [facility] for tasks assessed/performed      Past Medical History:  Diagnosis Date  . Allergy   . Arthritis   . 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 Cataracts, bilateral   . Chicken pox   . Dyslipidemia   . Hyperlipidemia   . Hypertension   . Sinus bradycardia     Past Surgical History:  Procedure Laterality Date  . BYPASS GRAFT  1996  . CATARACT EXTRACTION Right   . REFRACTIVE SURGERY Right   . SKIN BIOPSY    . UMBILICAL HERNIA REPAIR  2003    There were no vitals filed for this visit.      Subjective Assessment - 04/03/17 0925    Subjective Patient reports eqilibrium is really off today, yesterday was good though.    Pertinent History heart bypass surgery 20 years ago;  hasn't driven since 2993   Limitations Walking;House hold activities;Standing   How long can you walk comfortably? varies from all day with no cane   Diagnostic tests MRI head in 2 weeks;  MRI thoracic to lumbar in past shows scoliosis and arthritis;  very low Vit D then too much; hi calcium   Patient Stated Goals regain balance; less of a hazard to my wife   Currently in Pain? No/denies   Pain Score 0-No pain                         [facility] Adult PT Treatment/Exercise - 04/03/17 0001      Therapeutic Activites    Therapeutic Activities ADL's   ADL's standing and sitting posture     Neuro Re-ed    Neuro Re-ed Details  Propiroception, single leg balance, static and dynamic balance     Lumbar Exercises: Aerobic   Stationary Bike Nustep L1 x 10 minutes  Therapist present to discuss treatment     Knee/Hip Exercises: Standing   Hip Flexion AROM;Both;10 reps   Hip Abduction AROM;2 sets;10 reps   Hip Extension AROM;Both;10 reps     Knee/Hip Exercises: Seated   Long Arc Quad AROM;Both;10 reps  With ball squeeze     Shoulder Exercises: Seated   Horizontal ABduction Strengthening;20 reps;Theraband  VC to relax shoulders   Theraband Level (Shoulder Horizontal ABduction) Level 2 (Red)                  PT Short Term Goals - 03/31/17 1933      PT SHORT TERM GOAL #1   Title The patient will express understanding of best assistive device to decrease risk of falls as well as other safety considerations (lighting, throw rugs)  04/28/17   Time 4   Period Weeks   Status New  PT SHORT TERM GOAL #2   Title The patient will have improved Timed up and Go test to 25 sec indicating greater ease with sit to stand   Time 4   Period Weeks   Status New     PT SHORT TERM GOAL #3   Title BERG balance test improved from 29/56 to 33/56 indicating decreased risk of falls   Time 4   Period Weeks   Status New           PT Long Term Goals - 03/31/17 1936      PT LONG TERM GOAL #1   Title The patient will be independent with safe self progession of HEP needed for further improvements in balance and strength  05/26/17   Time 8   Period Weeks   Status New     PT LONG TERM GOAL #2   Title The patient will have improved LE strength to grossly 4/5 needed for greater ease with sit to stand and up and down steps   Time 8   Period Weeks   Status New     PT LONG TERM GOAL #3   Title The patient will have improved Activities-Specific Balance Scale score from 78% to 85% confidence with ADLss   Time  8   Period Weeks     PT LONG TERM GOAL #4   Title BERG balance score improved to 35/56 indicating decreased fall risk    Time 8   Period Weeks   Status New     PT LONG TERM GOAL #5   Title Timed up and Go score improved to 20 sec indicating improved gait speed, mobility with sit to stand and decreased fall risk   Time 8   Period Weeks   Status New               Plan - 04/03/17 1008    Clinical Impression Statement Patient presents with wide base of support through arms and legs with walking due to balance deficites. Pt also has very kyphotic thoraic curvature and forward head posture. Patient moves slowly through all exercises but able to tolerate well.  Patient will have MRI on May 4th. Patient will continue to benefit from skilled therapy for core stability and extremity strengthening.   Rehab Potential Good   Clinical Impairments Affecting Rehab Potential high risk of falls   PT Frequency 2x / week   PT Duration 8 weeks   PT Treatment/Interventions ADLs/Self Care Home Management;Therapeutic activities;Therapeutic exercise;Neuromuscular re-education;Stair training;Functional mobility training;Patient/family education;Manual techniques;Gait training   PT Next Visit Plan Dynamic Gait Index;  standing balance; LE strengthening; Nu-Step   Consulted and Agree with Plan of Care Patient      Patient will benefit from skilled therapeutic intervention in order to improve the following deficits and impairments:  Decreased balance, Decreased activity tolerance, Decreased strength, Decreased mobility, Difficulty walking, Postural dysfunction  Visit Diagnosis: Other abnormalities of gait and mobility  Muscle weakness (generalized)  Unsteadiness on feet  Unspecified lack of coordination     Problem List Patient Active Problem List   Diagnosis Date Noted  . Urinary hesitancy 02/10/2017  . Primary hypercoagulable state ([place]) [D68.59] 02/09/2017  . Encounter for therapeutic  drug monitoring 02/09/2017  . CAD (coronary artery disease) 02/09/2017  . Anticoagulated 02/09/2017    Mikle Bosworth PTA 04/03/2017, 11:04 AM  [facility] [address] [place], [place], [address] Phone: [phone]   Fax:  [phone]  Name:  [name] MRN: [id number] Date of Birth: 19-Feb-1946

## 2017-04-06 ENCOUNTER — Encounter: Payer: Self-pay | Admitting: Physical Therapy

## 2017-04-06 ENCOUNTER — Ambulatory Visit: Payer: Medicare Other | Admitting: Physical Therapy

## 2017-04-06 DIAGNOSIS — R2689 Other abnormalities of gait and mobility: Secondary | ICD-10-CM | POA: Diagnosis not present

## 2017-04-06 DIAGNOSIS — M6281 Muscle weakness (generalized): Secondary | ICD-10-CM

## 2017-04-06 DIAGNOSIS — R2681 Unsteadiness on feet: Secondary | ICD-10-CM | POA: Diagnosis not present

## 2017-04-06 DIAGNOSIS — R279 Unspecified lack of coordination: Secondary | ICD-10-CM

## 2017-04-06 NOTE — Patient Instructions (Signed)

## 2017-04-06 NOTE — Therapy (Signed)
[facility] [address], [place], [place], [address] Phone: [phone]   Fax:  [phone]  Physical Therapy Treatment  Patient Details  [name] MRN: [id number] Date of Birth: Aug 17, 1946 Referring Provider: Dr.  Martinique  Encounter Date: 04/06/2017      PT End of Session - 04/06/17 1017    Visit Number 3   Number of Visits 10   Date for PT Re-Evaluation 05/26/17   Authorization Type  G code; KX modifiers at visit 15   PT Start Time 1015   PT Stop Time 1059   PT Time Calculation (min) 44 min   Activity Tolerance Patient tolerated treatment well   Behavior During Therapy [facility] for tasks assessed/performed      Past Medical History:  Diagnosis Date  . Allergy   . Arthritis   . 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 Cataracts, bilateral   . Chicken pox   . Dyslipidemia   . Hyperlipidemia   . Hypertension   . Sinus bradycardia     Past Surgical History:  Procedure Laterality Date  . BYPASS GRAFT  1996  . CATARACT EXTRACTION Right   . REFRACTIVE SURGERY Right   . SKIN BIOPSY    . UMBILICAL HERNIA REPAIR  2003    There were no vitals filed for this visit.      Subjective Assessment - 04/06/17 1016    Subjective Patient reports dizziness over weekend.    Pertinent History heart bypass surgery 20 years ago;  hasn't driven since 2831   Limitations Walking;House hold activities;Standing   How long can you walk comfortably? varies from all day with no cane   Diagnostic tests MRI head in 2 weeks;  MRI thoracic to lumbar in past shows scoliosis and arthritis;  very low Vit D then too much; hi calcium   Patient Stated Goals regain balance; less of a hazard to my wife                         [place] Adult PT Treatment/Exercise - 04/06/17 0001      Therapeutic Activites    Therapeutic Activities ADL's   ADL's standing and sitting posture     Neuro Re-ed    Neuro  Re-ed Details  Propiroception, single leg balance, static and dynamic balance     Lumbar Exercises: Aerobic   Stationary Bike Nustep L1 x 10 minutes  Therapist present to discuss treatment     Knee/Hip Exercises: Standing   Hip Flexion AROM;Both;10 reps  Verbal cues to stand up straight   Hip Abduction AROM;2 sets;10 reps   Hip Extension AROM;Both;10 reps  Shoulder extension     Knee/Hip Exercises: Seated   Long Arc Quad AROM;Both;10 reps  With ball squeeze     Shoulder Exercises: Seated   Horizontal ABduction Strengthening;20 reps;Theraband  VC to relax shoulders   Theraband Level (Shoulder Horizontal ABduction) Level 2 (Red)   External Rotation Strengthening;Both;20 reps;Theraband   Theraband Level (Shoulder External Rotation) Level 2 (Red)                PT Education - 04/06/17 1038    Education provided Yes   Education Details Fall prevention   Person(s) Educated Patient   Methods Explanation;Handout   Comprehension Verbalized understanding          PT Short Term Goals - 04/06/17 1036      PT SHORT  TERM GOAL #1   Title The patient will express understanding of best assistive device to decrease risk of falls as well as other safety considerations (lighting, throw rugs)  04/28/17   Time 4   Period Weeks   Status Achieved     PT SHORT TERM GOAL #2   Title The patient will have improved Timed up and Go test to 25 sec indicating greater ease with sit to stand   Time 4   Period Weeks   Status On-going     PT SHORT TERM GOAL #3   Title BERG balance test improved from 29/56 to 33/56 indicating decreased risk of falls   Time 4   Period Weeks   Status On-going           PT Long Term Goals - 04/06/17 1036      PT LONG TERM GOAL #1   Title The patient will be independent with safe self progession of HEP needed for further improvements in balance and strength  05/26/17   Time 8   Period Weeks   Status On-going     PT LONG TERM GOAL #2   Title The  patient will have improved LE strength to grossly 4/5 needed for greater ease with sit to stand and up and down steps   Time 8   Period Weeks   Status On-going     PT LONG TERM GOAL #3   Title The patient will have improved Activities-Specific Balance Scale score from 78% to 85% confidence with ADLss   Time 8   Period Weeks   Status On-going     PT LONG TERM GOAL #4   Title BERG balance score improved to 35/56 indicating decreased fall risk    Time 8   Period Weeks   Status On-going     PT LONG TERM GOAL #5   Title Timed up and Go score improved to 20 sec indicating improved gait speed, mobility with sit to stand and decreased fall risk   Time 8   Period Weeks   Status On-going               Plan - 04/06/17 1021    Clinical Impression Statement Patient reports having dizzy spells over weekend. Patient presents with very slow unsteady gait with straight cane and widr BOS through arms and legs. Patient doing better with posture through shoulders and neck needing fewer verbal cues with exercises. Patient will have MRI on Friday. Patient will continue to benefit from skilled therapy for postural strenghtening and balance as able.    Rehab Potential Good   Clinical Impairments Affecting Rehab Potential high risk of falls   PT Frequency 2x / week   PT Duration 8 weeks   PT Treatment/Interventions ADLs/Self Care Home Management;Therapeutic activities;Therapeutic exercise;Neuromuscular re-education;Stair training;Functional mobility training;Patient/family education;Manual techniques;Gait training   PT Next Visit Plan seated and standing strengthening as tolerated   Consulted and Agree with Plan of Care Patient      Patient will benefit from skilled therapeutic intervention in order to improve the following deficits and impairments:  Decreased balance, Decreased activity tolerance, Decreased strength, Decreased mobility, Difficulty walking, Postural dysfunction  Visit  Diagnosis: Other abnormalities of gait and mobility  Muscle weakness (generalized)  Unsteadiness on feet  Unspecified lack of coordination     Problem List Patient Active Problem List   Diagnosis Date Noted  . Urinary hesitancy 02/10/2017  . Primary hypercoagulable state ([place]) [D68.59] 02/09/2017  . Encounter for therapeutic  drug monitoring 02/09/2017  . CAD (coronary artery disease) 02/09/2017  . Anticoagulated 02/09/2017    Mikle Bosworth PTA 04/06/2017, 11:05 AM  [facility] [address] [place], [place], [address] Phone: [phone]   Fax:  [phone]  [name] MRN: [id number] Date of Birth: 1946-02-24

## 2017-04-08 DIAGNOSIS — M5136 Other intervertebral disc degeneration, lumbar region: Secondary | ICD-10-CM | POA: Diagnosis not present

## 2017-04-08 DIAGNOSIS — M9902 Segmental and somatic dysfunction of thoracic region: Secondary | ICD-10-CM | POA: Diagnosis not present

## 2017-04-08 DIAGNOSIS — M9903 Segmental and somatic dysfunction of lumbar region: Secondary | ICD-10-CM | POA: Diagnosis not present

## 2017-04-08 DIAGNOSIS — M5431 Sciatica, right side: Secondary | ICD-10-CM | POA: Diagnosis not present

## 2017-04-08 DIAGNOSIS — M5134 Other intervertebral disc degeneration, thoracic region: Secondary | ICD-10-CM | POA: Diagnosis not present

## 2017-04-08 DIAGNOSIS — M9905 Segmental and somatic dysfunction of pelvic region: Secondary | ICD-10-CM | POA: Diagnosis not present

## 2017-04-10 ENCOUNTER — Ambulatory Visit: Payer: Medicare Other | Attending: Family Medicine | Admitting: Physical Therapy

## 2017-04-10 ENCOUNTER — Encounter: Payer: Self-pay | Admitting: Physical Therapy

## 2017-04-10 ENCOUNTER — Ambulatory Visit
Admission: RE | Admit: 2017-04-10 | Discharge: 2017-04-10 | Disposition: A | Discharge status: Home / Self Care | Payer: Medicare Other | Source: Ambulatory Visit | Attending: Family Medicine | Admitting: Family Medicine

## 2017-04-10 DIAGNOSIS — R200 Anesthesia of skin: Secondary | ICD-10-CM

## 2017-04-10 DIAGNOSIS — R279 Unspecified lack of coordination: Secondary | ICD-10-CM | POA: Diagnosis not present

## 2017-04-10 DIAGNOSIS — R2681 Unsteadiness on feet: Secondary | ICD-10-CM | POA: Insufficient documentation

## 2017-04-10 DIAGNOSIS — R202 Paresthesia of skin: Secondary | ICD-10-CM

## 2017-04-10 DIAGNOSIS — R2689 Other abnormalities of gait and mobility: Secondary | ICD-10-CM | POA: Insufficient documentation

## 2017-04-10 DIAGNOSIS — M6281 Muscle weakness (generalized): Secondary | ICD-10-CM

## 2017-04-10 DIAGNOSIS — R531 Weakness: Secondary | ICD-10-CM

## 2017-04-10 NOTE — Therapy (Signed)
[facility] [address], [place], [place], [address] Phone: [phone]   Fax:  [phone]  Physical Therapy Treatment  Patient Details  [name] MRN: [id number] Date of Birth: [birth year] Referring Provider: Dr.  Martinique  Encounter Date: 04/10/2017      PT End of Session - 04/10/17 0926    Visit Number 4   Number of Visits 10   Date for PT Re-Evaluation 05/26/17   Authorization Type  G code; KX modifiers at visit 15   PT Start Time 0921   PT Stop Time 1011   PT Time Calculation (min) 50 min   Activity Tolerance Patient tolerated treatment well   Behavior During Therapy [facility] for tasks assessed/performed      Past Medical History:  Diagnosis Date  . Allergy   . Arthritis   . 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 Cataracts, bilateral   . Chicken pox   . Dyslipidemia   . Hyperlipidemia   . Hypertension   . Sinus bradycardia     Past Surgical History:  Procedure Laterality Date  . BYPASS GRAFT  1996  . CATARACT EXTRACTION Right   . REFRACTIVE SURGERY Right   . SKIN BIOPSY    . UMBILICAL HERNIA REPAIR  2003    There were no vitals filed for this visit.      Subjective Assessment - 04/10/17 0925    Subjective No pain, having MRI tonight.   Currently in Pain? No/denies   Multiple Pain Sites No                         [facility] Adult PT Treatment/Exercise - 04/10/17 0001      Lumbar Exercises: Aerobic   Stationary Bike Nustep L 1 x 10 min  [name] present      Knee/Hip Exercises: Standing   Hip Flexion Stengthening;Both;2 sets;10 reps;Knee bent   Hip Flexion Limitations 2  Had to stop second set d/t pt swaying and needed to sit.    Hip Abduction AAROM;Both;2 sets;10 reps;Knee straight  Postural VC 2# added   Hip Extension Stengthening;Both;2 sets;10 reps;Knee straight  2# added     Knee/Hip Exercises: Seated   Long Arc Quad Strengthening;Both;2 sets;10  reps;Weights  VC for posture   Long Arc Quad Weight 2 lbs.     Shoulder Exercises: Seated   Horizontal ABduction Strengthening;Both;Theraband   Theraband Level (Shoulder Horizontal ABduction) Level 2 (Red)  2x15   Horizontal ABduction Limitations Pt noodle behind back for thoracic mobiization   External Rotation Strengthening;Both;Theraband   Theraband Level (Shoulder External Rotation) Level 2 (Red)  2x15   External Rotation Limitations noodle behind the back for thoracic mobiliation                  PT Short Term Goals - 04/06/17 1036      PT SHORT TERM GOAL #1   Title The patient will express understanding of best assistive device to decrease risk of falls as well as other safety considerations (lighting, throw rugs)  04/28/17   Time 4   Period Weeks   Status Achieved     PT SHORT TERM GOAL #2   Title The patient will have improved Timed up and Go test to 25 sec indicating greater ease with sit to stand   Time 4   Period Weeks   Status On-going     PT SHORT TERM GOAL #  3   Title BERG balance test improved from 29/56 to 33/56 indicating decreased risk of falls   Time 4   Period Weeks   Status On-going           PT Long Term Goals - 04/06/17 1036      PT LONG TERM GOAL #1   Title The patient will be independent with safe self progession of HEP needed for further improvements in balance and strength  05/26/17   Time 8   Period Weeks   Status On-going     PT LONG TERM GOAL #2   Title The patient will have improved LE strength to grossly 4/5 needed for greater ease with sit to stand and up and down steps   Time 8   Period Weeks   Status On-going     PT LONG TERM GOAL #3   Title The patient will have improved Activities-Specific Balance Scale score from 78% to 85% confidence with ADLss   Time 8   Period Weeks   Status On-going     PT LONG TERM GOAL #4   Title BERG balance score improved to 35/56 indicating decreased fall risk    Time 8   Period  Weeks   Status On-going     PT LONG TERM GOAL #5   Title Timed up and Go score improved to 20 sec indicating improved gait speed, mobility with sit to stand and decreased fall risk   Time 8   Period Weeks   Status On-going               Plan - 04/10/17 8563    Clinical Impression Statement Added resistance and reps to pt's program today for strength and endurance progression. Placing a noodle behind his back during  helped his posture during the exercises.  Some dizziness requiring to sit but not significant limitation.    Rehab Potential Good   Clinical Impairments Affecting Rehab Potential high risk of falls   PT Frequency 2x / week   PT Duration 8 weeks   PT Treatment/Interventions ADLs/Self Care Home Management;Therapeutic activities;Therapeutic exercise;Neuromuscular re-education;Stair training;Functional mobility training;Patient/family education;Manual techniques;Gait training   PT Next Visit Plan seated and standing strengthening as tolerated   Consulted and Agree with Plan of Care Patient      Patient will benefit from skilled therapeutic intervention in order to improve the following deficits and impairments:  Decreased balance, Decreased activity tolerance, Decreased strength, Decreased mobility, Difficulty walking, Postural dysfunction  Visit Diagnosis: Other abnormalities of gait and mobility  Muscle weakness (generalized)  Unsteadiness on feet  Unspecified lack of coordination     Problem List Patient Active Problem List   Diagnosis Date Noted  . Urinary hesitancy 02/10/2017  . Primary hypercoagulable state ([place]) [D68.59] 02/09/2017  . Encounter for therapeutic drug monitoring 02/09/2017  . CAD (coronary artery disease) 02/09/2017  . Anticoagulated 02/09/2017    [name], [name] 04/10/2017, 10:10 AM  [facility] [address], [place], [place], [address] Phone: [phone]   Fax:   [phone]  [name] MRN: [id number] Date of Birth: Apr 09, 1946

## 2017-04-13 ENCOUNTER — Encounter: Payer: Medicare Other | Admitting: Physical Therapy

## 2017-04-14 ENCOUNTER — Encounter: Payer: Self-pay | Admitting: Physical Therapy

## 2017-04-14 ENCOUNTER — Ambulatory Visit: Payer: Medicare Other | Admitting: Physical Therapy

## 2017-04-14 DIAGNOSIS — M6281 Muscle weakness (generalized): Secondary | ICD-10-CM

## 2017-04-14 DIAGNOSIS — R279 Unspecified lack of coordination: Secondary | ICD-10-CM

## 2017-04-14 DIAGNOSIS — R2681 Unsteadiness on feet: Secondary | ICD-10-CM

## 2017-04-14 DIAGNOSIS — R2689 Other abnormalities of gait and mobility: Secondary | ICD-10-CM | POA: Diagnosis not present

## 2017-04-14 NOTE — Patient Instructions (Signed)
  Resisted Horizontal Abduction: Bilateral    Sit or stand, tubing in both hands, arms out in front. Keeping arms straight, pinch shoulder blades together and stretch arms out. Repeat ____ times per set. Do ____ sets per session. Do ____ sessions per day.  http://orth.exer.us/968   Copyright  VHI. All rights reserved.    ABDUCTION: Standing - Resistance Band (Active)   Stand, feet flat. Against yellow resistance band, lift right leg out to side. Complete ___ sets of ___ repetitions. Perform ___ sessions per day.  ADDUCTION: Standing - Stable: Resistance Band (Active)   Stand, right leg out to side as far as possible. Against yellow resistance band, draw leg in across midline. Complete ___ sets of ___ repetitions. Perform ___ sessions per day.  Strengthening: Hip Flexion - Resisted   With tubing around left ankle, anchor behind, bring leg forward, keeping knee straight. Repeat ____ times per set. Do ____ sets per session. Do ____ sessions per day.  Strengthening: Hip Extension - Resisted   With tubing around right ankle, face anchor and pull leg straight back. Repeat ____ times per set. Do ____ sets per session. Do ____ sessions per day.  Mikle Bosworth, PTA 04/14/17 3:28 PM  [facility] [address], [place], [place] [address] Phone # [phone] Fax [phone]

## 2017-04-14 NOTE — Therapy (Signed)
[facility] [address] [place], [place], [address] Phone: [phone]   Fax:  [phone]  Physical Therapy Treatment  Patient Details  [name] MRN: [id number] Date of Birth: 23-Sep-1946 Referring Provider: Dr.  [place]  Encounter Date: 04/14/2017      PT End of Session - 04/14/17 1456    Visit Number 5   Number of Visits 10   Date for PT Re-Evaluation 05/26/17   Authorization Type  G code; KX modifiers at visit 15   PT Start Time 1445   PT Stop Time 1526   PT Time Calculation (min) 41 min   Activity Tolerance Patient tolerated treatment well   Behavior During Therapy [facility] for tasks assessed/performed      Past Medical History:  Diagnosis Date  . Allergy   . Arthritis   . 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 Cataracts, bilateral   . Chicken pox   . Dyslipidemia   . Hyperlipidemia   . Hypertension   . Sinus bradycardia     Past Surgical History:  Procedure Laterality Date  . BYPASS GRAFT  1996  . CATARACT EXTRACTION Right   . REFRACTIVE SURGERY Right   . SKIN BIOPSY    . UMBILICAL HERNIA REPAIR  2003    There were no vitals filed for this visit.      Subjective Assessment - 04/14/17 1451    Subjective Patient was in too much pain to be able to lay still for MRI. Patient will attempt another MRI with pain medicince and possibly sedative. The muscles in my head feel so tight today shoulders are tight, probably from lifting things   Pertinent History heart bypass surgery 20 years ago;  hasn't driven since 1324   Limitations Walking;House hold activities;Standing   How long can you walk comfortably? varies from all day with no cane   Diagnostic tests MRI head in 2 weeks;  MRI thoracic to lumbar in past shows scoliosis and arthritis;  very low Vit D then too much; hi calcium   Patient Stated Goals regain balance; less of a hazard to my wife   Currently in Pain? No/denies    Pain Score 0-No pain   Aggravating Factors  yardwork, painting, lifting things   Pain Relieving Factors stand up straight and take deep breaths, hold on to furniture; heat and ice compression that bothers me the most                         [facility] Adult PT Treatment/Exercise - 04/14/17 0001      Lumbar Exercises: Aerobic   Stationary Bike Nustep L 2 x 10 min  PTA present      Knee/Hip Exercises: Standing   Hip Flexion Stengthening;Both;2 sets;10 reps;Knee bent   Hip Flexion Limitations 2  Had to stop second set d/t pt swaying and needed to sit.    Hip Abduction AAROM;Both;2 sets;10 reps;Knee straight  Postural VC 2# added   Hip Extension Stengthening;Both;2 sets;10 reps;Knee straight  2# added     Knee/Hip Exercises: Seated   Long Arc Quad Strengthening;Both;2 sets;10 reps;Weights  VC for posture   Long Arc Quad Weight 2 lbs.     Shoulder Exercises: Seated   Horizontal ABduction Strengthening;Both;Theraband   Theraband Level (Shoulder Horizontal ABduction) Level 2 (Red)  2x15   Horizontal ABduction Limitations Pt noodle behind back for thoracic mobiization   External Rotation Strengthening;Both;Theraband  Theraband Level (Shoulder External Rotation) Level 2 (Red)  2x15   External Rotation Limitations noodle behind the back for thoracic mobiliation                PT Education - 04/14/17 1535    Education provided Yes   Education Details Shoulder and LE strengthening   Person(s) Educated Patient   Methods Explanation;Handout   Comprehension Verbalized understanding          PT Short Term Goals - 04/14/17 1524      PT SHORT TERM GOAL #2   Title The patient will have improved Timed up and Go test to 25 sec indicating greater ease with sit to stand   Time 4   Period Weeks   Status On-going     PT SHORT TERM GOAL #3   Title BERG balance test improved from 29/56 to 33/56 indicating decreased risk of falls   Time 4   Period Weeks    Status On-going           PT Long Term Goals - 04/14/17 1524      PT LONG TERM GOAL #1   Title The patient will be independent with safe self progession of HEP needed for further improvements in balance and strength  05/26/17   Time 8   Period Weeks   Status On-going     PT LONG TERM GOAL #2   Title The patient will have improved LE strength to grossly 4/5 needed for greater ease with sit to stand and up and down steps   Time 8   Period Weeks   Status On-going               Plan - 04/14/17 1522    Clinical Impression Statement Patient reports being tired today after moving preparing for a yard sale. Felt very good after last session. Able to toerate all exercises well today with minimal fatigue. Patient MRI was unsuccessful but is hoping to reschedule. Patient will continue to benefit from skiled thearpy for strenghtening and balance.    Rehab Potential Good   Clinical Impairments Affecting Rehab Potential high risk of falls   PT Frequency 2x / week   PT Duration 8 weeks   PT Treatment/Interventions ADLs/Self Care Home Management;Therapeutic activities;Therapeutic exercise;Neuromuscular re-education;Stair training;Functional mobility training;Patient/family education;Manual techniques;Gait training   PT Next Visit Plan seated and standing strengthening as tolerated   Consulted and Agree with Plan of Care Patient      Patient will benefit from skilled therapeutic intervention in order to improve the following deficits and impairments:  Decreased balance, Decreased activity tolerance, Decreased strength, Decreased mobility, Difficulty walking, Postural dysfunction  Visit Diagnosis: Other abnormalities of gait and mobility  Muscle weakness (generalized)  Unsteadiness on feet  Unspecified lack of coordination     Problem List Patient Active Problem List   Diagnosis Date Noted  . Urinary hesitancy 02/10/2017  . Primary hypercoagulable state ([place]) [D68.59]  02/09/2017  . Encounter for therapeutic drug monitoring 02/09/2017  . CAD (coronary artery disease) 02/09/2017  . Anticoagulated 02/09/2017    Mikle Bosworth PTA 04/14/2017, 4:30 PM  [facility] [address], [place], [place], [address] Phone: [phone]   Fax:  [phone]  Name: GERAD CORNELIO MRN: [id number] Date of Birth: 04-10-1946

## 2017-04-15 DIAGNOSIS — M5431 Sciatica, right side: Secondary | ICD-10-CM | POA: Diagnosis not present

## 2017-04-15 DIAGNOSIS — M9905 Segmental and somatic dysfunction of pelvic region: Secondary | ICD-10-CM | POA: Diagnosis not present

## 2017-04-15 DIAGNOSIS — M9903 Segmental and somatic dysfunction of lumbar region: Secondary | ICD-10-CM | POA: Diagnosis not present

## 2017-04-15 DIAGNOSIS — M9902 Segmental and somatic dysfunction of thoracic region: Secondary | ICD-10-CM | POA: Diagnosis not present

## 2017-04-15 DIAGNOSIS — M5134 Other intervertebral disc degeneration, thoracic region: Secondary | ICD-10-CM | POA: Diagnosis not present

## 2017-04-15 DIAGNOSIS — M5136 Other intervertebral disc degeneration, lumbar region: Secondary | ICD-10-CM | POA: Diagnosis not present

## 2017-04-16 ENCOUNTER — Encounter: Payer: Self-pay | Admitting: Family Medicine

## 2017-04-17 ENCOUNTER — Ambulatory Visit: Payer: Medicare Other | Admitting: Physical Therapy

## 2017-04-17 ENCOUNTER — Encounter: Payer: Self-pay | Admitting: Physical Therapy

## 2017-04-17 DIAGNOSIS — R279 Unspecified lack of coordination: Secondary | ICD-10-CM | POA: Diagnosis not present

## 2017-04-17 DIAGNOSIS — M6281 Muscle weakness (generalized): Secondary | ICD-10-CM | POA: Diagnosis not present

## 2017-04-17 DIAGNOSIS — R2681 Unsteadiness on feet: Secondary | ICD-10-CM | POA: Diagnosis not present

## 2017-04-17 DIAGNOSIS — R2689 Other abnormalities of gait and mobility: Secondary | ICD-10-CM

## 2017-04-17 NOTE — Therapy (Signed)
[facility] [address] [place], [place], [address] Phone: [phone]   Fax:  [phone]  Physical Therapy Treatment  Patient Details  [name] MRN: [id number] Date of Birth: 05-19-1946 Referring Provider: Dr.  [place]  Encounter Date: 04/17/2017      PT End of Session - 04/17/17 1017    Visit Number 6   Number of Visits 10   Date for PT Re-Evaluation 05/26/17   Authorization Type  G code; KX modifiers at visit 15   PT Start Time 1016   PT Stop Time 1100   PT Time Calculation (min) 44 min   Activity Tolerance Patient tolerated treatment well   Behavior During Therapy [facility] for tasks assessed/performed      Past Medical History:  Diagnosis Date  . Allergy   . Arthritis   . 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 Cataracts, bilateral   . Chicken pox   . Dyslipidemia   . Hyperlipidemia   . Hypertension   . Sinus bradycardia     Past Surgical History:  Procedure Laterality Date  . BYPASS GRAFT  1996  . CATARACT EXTRACTION Right   . REFRACTIVE SURGERY Right   . SKIN BIOPSY    . UMBILICAL HERNIA REPAIR  2003    There were no vitals filed for this visit.      Subjective Assessment - 04/17/17 1018    Subjective Pt reports feeling sleepy, in a fog this AM.  Had 2 days of bad dizziness this week. Saw chiropractor wednesday which helped.    Currently in Pain? No/denies   Multiple Pain Sites No                         [facility] Adult PT Treatment/Exercise - 04/17/17 0001      Lumbar Exercises: Aerobic   Stationary Bike L2 x 12 min  PTA monitored and updated status     Knee/Hip Exercises: Standing   Heel Raises Both;1 set;10 reps   Hip Flexion Stengthening;Both;2 sets;10 reps;Knee bent   Hip Flexion Limitations 2  Had to stop second set d/t pt swaying and needed to sit.    Hip Abduction AAROM;Both;2 sets;10 reps;Knee straight  Postural VC 2# added   Hip  Extension Stengthening;Both;2 sets;10 reps;Knee straight  2# added     Knee/Hip Exercises: Seated   Long Arc Quad Strengthening;Both;2 sets;10 reps;Weights  VC for posture   Long Arc Quad Weight 2 lbs.     Shoulder Exercises: Seated   Horizontal ABduction Strengthening;Both;Theraband   Theraband Level (Shoulder Horizontal ABduction) Level 2 (Red)  2x15   Horizontal ABduction Limitations Pt noodle behind back for thoracic mobiization   External Rotation Strengthening;Both;Theraband   Theraband Level (Shoulder External Rotation) Level 2 (Red)  2x15   External Rotation Limitations noodle behind the back for thoracic mobiliation                  PT Short Term Goals - 04/14/17 1524      PT SHORT TERM GOAL #2   Title The patient will have improved Timed up and Go test to 25 sec indicating greater ease with sit to stand   Time 4   Period Weeks   Status On-going     PT SHORT TERM GOAL #3   Title BERG balance test improved from 29/56 to 33/56 indicating decreased risk of falls   Time 4  Period Weeks   Status On-going           PT Long Term Goals - 04/14/17 1524      PT LONG TERM GOAL #1   Title The patient will be independent with safe self progession of HEP needed for further improvements in balance and strength  05/26/17   Time 8   Period Weeks   Status On-going     PT LONG TERM GOAL #2   Title The patient will have improved LE strength to grossly 4/5 needed for greater ease with sit to stand and up and down steps   Time 8   Period Weeks   Status On-going               Plan - 04/17/17 1017    Clinical Impression Statement Despite reports of feeling tired and foggy, pt performed all exercises with control, no pain, and minimal fatigue. Continues to need cuing for posture, but pt's awareness and ability to self correct is improving.     Rehab Potential Good   Clinical Impairments Affecting Rehab Potential high risk of falls   PT Frequency 2x / week    PT Duration 8 weeks   PT Treatment/Interventions ADLs/Self Care Home Management;Therapeutic activities;Therapeutic exercise;Neuromuscular re-education;Stair training;Functional mobility training;Patient/family education;Manual techniques;Gait training   PT Next Visit Plan seated and standing strengthening as tolerated   Consulted and Agree with Plan of Care Patient      Patient will benefit from skilled therapeutic intervention in order to improve the following deficits and impairments:  Decreased balance, Decreased activity tolerance, Decreased strength, Decreased mobility, Difficulty walking, Postural dysfunction  Visit Diagnosis: Other abnormalities of gait and mobility  Muscle weakness (generalized)  Unsteadiness on feet  Unspecified lack of coordination     Problem List Patient Active Problem List   Diagnosis Date Noted  . Urinary hesitancy 02/10/2017  . Primary hypercoagulable state ([place]) [D68.59] 02/09/2017  . Encounter for therapeutic drug monitoring 02/09/2017  . CAD (coronary artery disease) 02/09/2017  . Anticoagulated 02/09/2017    Alfhild Partch, PTA 04/17/2017, 10:53 AM  [facility] [address], [place], [place], [address] Phone: [phone]   Fax:  [phone]  Name: MANOLITO JUREWICZ MRN: [id number] Date of Birth: Jan 27, 1946

## 2017-04-20 ENCOUNTER — Ambulatory Visit: Payer: Medicare Other | Admitting: Physical Therapy

## 2017-04-20 ENCOUNTER — Encounter: Payer: Self-pay | Admitting: Physical Therapy

## 2017-04-20 DIAGNOSIS — R2689 Other abnormalities of gait and mobility: Secondary | ICD-10-CM | POA: Diagnosis not present

## 2017-04-20 DIAGNOSIS — R279 Unspecified lack of coordination: Secondary | ICD-10-CM

## 2017-04-20 DIAGNOSIS — M6281 Muscle weakness (generalized): Secondary | ICD-10-CM

## 2017-04-20 DIAGNOSIS — R2681 Unsteadiness on feet: Secondary | ICD-10-CM | POA: Diagnosis not present

## 2017-04-20 NOTE — Therapy (Signed)
[facility] [address], [place], [place], [address] Phone: [phone]   Fax:  [phone]  Physical Therapy Treatment  Patient Details  [name] MRN: [id number] Date of Birth: May 31, 1946 Referring Provider: Dr.  Martinique  Encounter Date: 04/20/2017      PT End of Session - 04/20/17 1018    Visit Number 7   Number of Visits 10   Date for PT Re-Evaluation 05/26/17   Authorization Type  G code; KX modifiers at visit 15   PT Start Time 1014   PT Stop Time 1058   PT Time Calculation (min) 44 min   Activity Tolerance Patient tolerated treatment well   Behavior During Therapy [facility] for tasks assessed/performed      Past Medical History:  Diagnosis Date  . Allergy   . Arthritis   . 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 Cataracts, bilateral   . Chicken pox   . Dyslipidemia   . Hyperlipidemia   . Hypertension   . Sinus bradycardia     Past Surgical History:  Procedure Laterality Date  . BYPASS GRAFT  1996  . CATARACT EXTRACTION Right   . REFRACTIVE SURGERY Right   . SKIN BIOPSY    . UMBILICAL HERNIA REPAIR  2003    There were no vitals filed for this visit.      Subjective Assessment - 04/20/17 1017    Subjective Patient reports not feeling completley dizzy today and has been able to lift boxes and furniture at home.    Pertinent History heart bypass surgery 20 years ago;  hasn't driven since 0938   Limitations Walking;House hold activities;Standing   How long can you walk comfortably? varies from all day with no cane   Diagnostic tests MRI head in 2 weeks;  MRI thoracic to lumbar in past shows scoliosis and arthritis;  very low Vit D then too much; hi calcium   Patient Stated Goals regain balance; less of a hazard to my wife   Currently in Pain? No/denies   Pain Score 0-No pain            [facility] PT Assessment - 04/20/17 0001      Berg Balance Test   Sit to Stand Able  to stand  independently using hands   Standing Unsupported Able to stand 2 minutes with supervision   Sitting with Back Unsupported but Feet Supported on Floor or Stool Able to sit safely and securely 2 minutes   Stand to Sit Controls descent by using hands   Transfers Able to transfer safely, definite need of hands   Standing Unsupported with Eyes Closed Able to stand 10 seconds with supervision   Standing Ubsupported with Feet Together Able to place feet together independently but unable to hold for 30 seconds   From Standing, Reach Forward with Outstretched Arm Reaches forward but needs supervision   From Standing Position, Pick up Object from Floor Able to pick up shoe, needs supervision   From Standing Position, Turn to Look Behind Over each Shoulder Turn sideways only but maintains balance   Turn 360 Degrees Needs close supervision or verbal cueing   Standing Unsupported, Alternately Place Feet on Step/Stool Needs assistance to keep from falling or unable to try   Standing Unsupported, One Foot in Front Able to take small step independently and hold 30 seconds   Standing on One Leg Tries to lift leg/unable to hold 3 seconds but  remains standing independently   Total Score 31     Timed Up and Go Test   Manual TUG (seconds) 24                     [facility] Adult PT Treatment/Exercise - 04/20/17 0001      Lumbar Exercises: Aerobic   Stationary Bike L2 x 10 min  PTA monitored and updated status     Knee/Hip Exercises: Standing   Heel Raises Both;1 set;10 reps   Hip Flexion Stengthening;Both;2 sets;10 reps;Knee bent   Hip Flexion Limitations 2   Hip ADduction Strengthening;Both;2 sets;10 reps  #2   Hip Abduction Stengthening;Both;2 sets;10 reps  #2   Hip Extension Stengthening;Both;2 sets;10 reps;Knee straight  2#   Forward Step Up Both;10 reps;Hand Hold: 2  Alternating toe taps                  PT Short Term Goals - 04/20/17 1030      PT SHORT TERM GOAL  #2   Title The patient will have improved Timed up and Go test to 25 sec indicating greater ease with sit to stand   Baseline 24 (04/20/17)   Status Achieved     PT SHORT TERM GOAL #3   Title BERG balance test improved from 29/56 to 33/56 indicating decreased risk of falls   Baseline 31/56   Time 4   Period Weeks   Status On-going           PT Long Term Goals - 04/20/17 1019      PT LONG TERM GOAL #1   Title The patient will be independent with safe self progession of HEP needed for further improvements in balance and strength  05/26/17   Time 8   Period Weeks   Status On-going     PT LONG TERM GOAL #2   Title The patient will have improved LE strength to grossly 4/5 needed for greater ease with sit to stand and up and down steps   Time 8   Period Weeks   Status On-going               Plan - 04/20/17 1057    Clinical Impression Statement Patient reports not feeling completeley dizzy today. Walking with smaller BOS. Improved TUG time to 24 seconds to meet short term goal. Improved BERG balance score to 31. Continues to have difficulty with standing unsupported. Patient will continue to benefit from skilled therapy for LE and core strength and stability.    Rehab Potential Good   Clinical Impairments Affecting Rehab Potential high risk of falls   PT Frequency 2x / week   PT Duration 8 weeks   PT Treatment/Interventions ADLs/Self Care Home Management;Therapeutic activities;Therapeutic exercise;Neuromuscular re-education;Stair training;Functional mobility training;Patient/family education;Manual techniques;Gait training   Consulted and Agree with Plan of Care Patient      Patient will benefit from skilled therapeutic intervention in order to improve the following deficits and impairments:  Decreased balance, Decreased activity tolerance, Decreased strength, Decreased mobility, Difficulty walking, Postural dysfunction  Visit Diagnosis: Other abnormalities of gait and  mobility  Muscle weakness (generalized)  Unsteadiness on feet  Unspecified lack of coordination     Problem List Patient Active Problem List   Diagnosis Date Noted  . Urinary hesitancy 02/10/2017  . Primary hypercoagulable state ([place]) [D68.59] 02/09/2017  . Encounter for therapeutic drug monitoring 02/09/2017  . CAD (coronary artery disease) 02/09/2017  . Anticoagulated 02/09/2017    Mikle Bosworth  PTA 04/20/2017, 12:44 PM  [facility] [address] [place], [place], [address] Phone: [phone]   Fax:  [phone]  [name] MRN: [id number] Date of Birth: 02/27/1946

## 2017-04-22 ENCOUNTER — Telehealth: Payer: Self-pay | Admitting: Family Medicine

## 2017-04-22 DIAGNOSIS — M5136 Other intervertebral disc degeneration, lumbar region: Secondary | ICD-10-CM | POA: Diagnosis not present

## 2017-04-22 DIAGNOSIS — M9902 Segmental and somatic dysfunction of thoracic region: Secondary | ICD-10-CM | POA: Diagnosis not present

## 2017-04-22 DIAGNOSIS — M5134 Other intervertebral disc degeneration, thoracic region: Secondary | ICD-10-CM | POA: Diagnosis not present

## 2017-04-22 DIAGNOSIS — M9905 Segmental and somatic dysfunction of pelvic region: Secondary | ICD-10-CM | POA: Diagnosis not present

## 2017-04-22 DIAGNOSIS — M5431 Sciatica, right side: Secondary | ICD-10-CM | POA: Diagnosis not present

## 2017-04-22 DIAGNOSIS — M9903 Segmental and somatic dysfunction of lumbar region: Secondary | ICD-10-CM | POA: Diagnosis not present

## 2017-04-22 NOTE — Telephone Encounter (Signed)
2 weeks ago he was unable to complete MRI and would like to see if Dr. Martinique can give him something to assist him to be able to get the MRI done.  And MRI needs to be rescheduled and pt does not have a local pharmacy only has a mail order pharmacy

## 2017-04-23 ENCOUNTER — Ambulatory Visit (INDEPENDENT_AMBULATORY_CARE_PROVIDER_SITE_OTHER): Payer: Medicare Other | Admitting: *Deleted

## 2017-04-23 DIAGNOSIS — D6859 Other primary thrombophilia: Secondary | ICD-10-CM

## 2017-04-23 DIAGNOSIS — Z5181 Encounter for therapeutic drug level monitoring: Secondary | ICD-10-CM

## 2017-04-23 DIAGNOSIS — I2510 Atherosclerotic heart disease of native coronary artery without angina pectoris: Secondary | ICD-10-CM

## 2017-04-23 LAB — POCT INR: INR: 5.4

## 2017-04-23 NOTE — Telephone Encounter (Signed)
Please advise 

## 2017-04-23 NOTE — Telephone Encounter (Signed)
Left voicemail for patient to call the office back.  Also replied to [employer] with response below.

## 2017-04-23 NOTE — Telephone Encounter (Signed)
Does he wants to try low dose Valium to take before MRI or he would like to wait until he sees neurologists?  Thanks, BJ

## 2017-04-24 ENCOUNTER — Encounter: Payer: Self-pay | Admitting: Physical Therapy

## 2017-04-24 ENCOUNTER — Ambulatory Visit: Payer: Medicare Other | Admitting: Physical Therapy

## 2017-04-24 DIAGNOSIS — R2681 Unsteadiness on feet: Secondary | ICD-10-CM | POA: Diagnosis not present

## 2017-04-24 DIAGNOSIS — M6281 Muscle weakness (generalized): Secondary | ICD-10-CM

## 2017-04-24 DIAGNOSIS — R2689 Other abnormalities of gait and mobility: Secondary | ICD-10-CM | POA: Diagnosis not present

## 2017-04-24 DIAGNOSIS — R279 Unspecified lack of coordination: Secondary | ICD-10-CM

## 2017-04-24 NOTE — Telephone Encounter (Signed)
Patient's wife came in while patient was at physical therapy. They are going to wait until they see Neurology, they are on a wait list incase someone cancels so they can get in earlier. She did ask if we can put in a referral for a dermatologist to take a look at the place on his scalp. Referral has already been put in, will ask [facility] to check on the status.

## 2017-04-24 NOTE — Therapy (Signed)
[name] [address], [place], [place], [address] Phone: [phone]   Fax:  [phone]  Physical Therapy Treatment  Patient Details  [name] MRN: [id number] Date of Birth: 09-Sep-1946 Referring Provider: Dr.  Martinique  Encounter Date: 04/24/2017      PT End of Session - 04/24/17 0928    Visit Number 8   Number of Visits 10   Date for PT Re-Evaluation 05/26/17   Authorization Type  G code; KX modifiers at visit 15   PT Start Time 0926   PT Stop Time 1013   PT Time Calculation (min) 47 min   Activity Tolerance Patient tolerated treatment well   Behavior During Therapy [facility] for tasks assessed/performed      Past Medical History:  Diagnosis Date  . Allergy   . Arthritis   . 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 Cataracts, bilateral   . Chicken pox   . Dyslipidemia   . Hyperlipidemia   . Hypertension   . Sinus bradycardia     Past Surgical History:  Procedure Laterality Date  . BYPASS GRAFT  1996  . CATARACT EXTRACTION Right   . REFRACTIVE SURGERY Right   . SKIN BIOPSY    . UMBILICAL HERNIA REPAIR  2003    There were no vitals filed for this visit.      Subjective Assessment - 04/24/17 0929    Subjective My coumadin was very eleveated yesterday we found out which was causing a lot of pressure in my head.  He is currently under a "resetting" of this meds.    Pertinent History heart bypass surgery 20 years ago;  hasn't driven since 0175   Limitations Walking;House hold activities;Standing   How long can you walk comfortably? varies from all day with no cane   Diagnostic tests MRI head in 2 weeks;  MRI thoracic to lumbar in past shows scoliosis and arthritis;  very low Vit D then too much; hi calcium   Patient Stated Goals regain balance; less of a hazard to my wife   Currently in Pain? No/denies   Multiple Pain Sites No                         [facility]  Adult PT Treatment/Exercise - 04/24/17 0001      Lumbar Exercises: Aerobic   Stationary Bike L3 x 12 min  PTA present     Knee/Hip Exercises: Standing   Heel Raises Both;1 set;15 reps   Hip Flexion Stengthening;Both;2 sets;10 reps;Knee straight   Hip Flexion Limitations 2.5   Hip ADduction Strengthening;Both;2 sets;10 reps   Hip ADduction Limitations 2.5   Hip Extension Stengthening;Both;2 sets;10 reps;Knee straight  2.5 #   Forward Step Up Both;1 set;15 reps;Hand Hold: 2;Step Height: 6"     Knee/Hip Exercises: Seated   Long Arc Quad Strengthening;Both;2 sets;10 reps;Weights   Long Arc Quad Weight 3 lbs.   Ball Squeeze 10x with simultaniously sitting taller   Sit to [employer] 1 set;10 reps     Shoulder Exercises: Seated   Horizontal ABduction Strengthening;Both;15 reps;Theraband  2 sets   Theraband Level (Shoulder Horizontal ABduction) Level 3 (Green)   External Rotation Strengthening;Both;15 reps;Theraband  2 sets   Theraband Level (Shoulder External Rotation) Level 3 Nyoka Cowden)                  PT Short Term Goals - 04/20/17 1030  PT SHORT TERM GOAL #2   Title The patient will have improved Timed up and Go test to 25 sec indicating greater ease with sit to stand   Baseline 24 (04/20/17)   Status Achieved     PT SHORT TERM GOAL #3   Title BERG balance test improved from 29/56 to 33/56 indicating decreased risk of falls   Baseline 31/56   Time 4   Period Weeks   Status On-going           PT Long Term Goals - 04/20/17 1019      PT LONG TERM GOAL #1   Title The patient will be independent with safe self progession of HEP needed for further improvements in balance and strength  05/26/17   Time 8   Period Weeks   Status On-going     PT LONG TERM GOAL #2   Title The patient will have improved LE strength to grossly 4/5 needed for greater ease with sit to stand and up and down steps   Time 8   Period Weeks   Status On-going               Plan -  04/24/17 3299    Clinical Impression Statement Pt presented today with significantly improved posture. Pt reports today that his hip do not go out anymore when he walks since coming to therapy.    Rehab Potential Good   Clinical Impairments Affecting Rehab Potential high risk of falls   PT Frequency 2x / week   PT Duration 8 weeks   PT Treatment/Interventions ADLs/Self Care Home Management;Therapeutic activities;Therapeutic exercise;Neuromuscular re-education;Stair training;Functional mobility training;Patient/family education;Manual techniques;Gait training   PT Next Visit Plan seated and standing strengthening as tolerated   Consulted and Agree with Plan of Care Patient      Patient will benefit from skilled therapeutic intervention in order to improve the following deficits and impairments:  Decreased balance, Decreased activity tolerance, Decreased strength, Decreased mobility, Difficulty walking, Postural dysfunction  Visit Diagnosis: Other abnormalities of gait and mobility  Muscle weakness (generalized)  Unsteadiness on feet  Unspecified lack of coordination     Problem List Patient Active Problem List   Diagnosis Date Noted  . Urinary hesitancy 02/10/2017  . Primary hypercoagulable state ([place]) [D68.59] 02/09/2017  . Encounter for therapeutic drug monitoring 02/09/2017  . CAD (coronary artery disease) 02/09/2017  . Anticoagulated 02/09/2017    Sanyah Molnar, PTA 04/24/2017, 10:03 AM  [facility] [address] [place], [place], [address] Phone: [phone]   Fax:  [phone]  [name] MRN: [id number] Date of Birth: 27-Sep-1946

## 2017-04-27 ENCOUNTER — Ambulatory Visit: Payer: Medicare Other | Admitting: Physical Therapy

## 2017-04-27 ENCOUNTER — Encounter: Payer: Self-pay | Admitting: Physical Therapy

## 2017-04-27 DIAGNOSIS — R279 Unspecified lack of coordination: Secondary | ICD-10-CM

## 2017-04-27 DIAGNOSIS — R2681 Unsteadiness on feet: Secondary | ICD-10-CM | POA: Diagnosis not present

## 2017-04-27 DIAGNOSIS — M6281 Muscle weakness (generalized): Secondary | ICD-10-CM

## 2017-04-27 DIAGNOSIS — R2689 Other abnormalities of gait and mobility: Secondary | ICD-10-CM

## 2017-04-27 NOTE — Therapy (Signed)
[facility] [address] [place], [place], [address] Phone: [phone]   Fax:  [phone]  Physical Therapy Treatment  Patient Details  [name] MRN: [id number] Date of Birth: 1946/11/16 Referring Provider: Dr.  Martinique  Encounter Date: 04/27/2017      PT End of Session - 04/27/17 1021    Visit Number 9   Number of Visits 10   Date for PT Re-Evaluation 05/26/17   Authorization Type  G code; KX modifiers at visit 15   PT Start Time 1019   PT Stop Time 1058   PT Time Calculation (min) 39 min   Activity Tolerance Patient tolerated treatment well   Behavior During Therapy [facility] for tasks assessed/performed      Past Medical History:  Diagnosis Date  . Allergy   . Arthritis   . 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 Cataracts, bilateral   . Chicken pox   . Dyslipidemia   . Hyperlipidemia   . Hypertension   . Sinus bradycardia     Past Surgical History:  Procedure Laterality Date  . BYPASS GRAFT  1996  . CATARACT EXTRACTION Right   . REFRACTIVE SURGERY Right   . SKIN BIOPSY    . UMBILICAL HERNIA REPAIR  2003    There were no vitals filed for this visit.      Subjective Assessment - 04/27/17 1019    Subjective My arthritis is acting up today, I just need ot take a hot shower and do my stretching. I haven't been taking the medicine for 3 days and just started it again last night.    Pertinent History heart bypass surgery 20 years ago;  hasn't driven since 5916   Limitations Walking;House hold activities;Standing   How long can you walk comfortably? varies from all day with no cane   Diagnostic tests MRI head in 2 weeks;  MRI thoracic to lumbar in past shows scoliosis and arthritis;  very low Vit D then too much; hi calcium   Patient Stated Goals regain balance; less of a hazard to my wife   Currently in Pain? No/denies                         [facility] Adult  PT Treatment/Exercise - 04/27/17 0001      Lumbar Exercises: Aerobic   Stationary Bike Nustep L 3 x 10 min  PTA present      Knee/Hip Exercises: Standing   Heel Raises Both;1 set;15 reps   Hip Flexion Stengthening;Both;2 sets;10 reps;Knee straight   Hip Flexion Limitations 3   Hip ADduction Strengthening;Both;2 sets;10 reps   Hip ADduction Limitations 3   Hip Extension Stengthening;Both;2 sets;10 reps;Knee straight  #3   Forward Step Up Both;1 set;15 reps;Hand Hold: 2;Step Height: 6"     Knee/Hip Exercises: Seated   Long Arc Quad Strengthening;Both;2 sets;10 reps;Weights   Long Arc Quad Weight 3 lbs.   Ball Squeeze 10x with simultaniously sitting taller   Sit to [employer] 1 set;10 reps     Shoulder Exercises: Seated   Horizontal ABduction Strengthening;Both;15 reps;Theraband  2 sets   Theraband Level (Shoulder Horizontal ABduction) Level 3 (Green)   External Rotation Strengthening;Both;15 reps;Theraband  2 sets   Theraband Level (Shoulder External Rotation) Level 3 Nyoka Cowden)                  PT Short Term Goals - 04/27/17 1022  PT SHORT TERM GOAL #3   Title BERG balance test improved from 29/56 to 33/56 indicating decreased risk of falls   Baseline 31/56   Time 4   Period Weeks   Status On-going           PT Long Term Goals - 04/27/17 1022      PT LONG TERM GOAL #2   Title The patient will have improved LE strength to grossly 4/5 needed for greater ease with sit to stand and up and down steps   Time 8   Period Weeks   Status On-going     PT LONG TERM GOAL #3   Title The patient will have improved Activities-Specific Balance Scale score from 78% to 85% confidence with ADLss   Time 8   Period Weeks   Status On-going               Plan - 04/27/17 1044    Clinical Impression Statement Patient doing well with all standing exercises and good sitting posture. Did well with step ups having good control. Patient will continue to benefit from skilled  thearpy for LE and core strengthening.    Rehab Potential Good   Clinical Impairments Affecting Rehab Potential high risk of falls   PT Frequency 2x / week   PT Duration 8 weeks   PT Treatment/Interventions ADLs/Self Care Home Management;Therapeutic activities;Therapeutic exercise;Neuromuscular re-education;Stair training;Functional mobility training;Patient/family education;Manual techniques;Gait training   PT Next Visit Plan seated and standing strengthening as tolerated   Consulted and Agree with Plan of Care Patient      Patient will benefit from skilled therapeutic intervention in order to improve the following deficits and impairments:  Decreased balance, Decreased activity tolerance, Decreased strength, Decreased mobility, Difficulty walking, Postural dysfunction  Visit Diagnosis: Other abnormalities of gait and mobility  Muscle weakness (generalized)  Unsteadiness on feet  Unspecified lack of coordination     Problem List Patient Active Problem List   Diagnosis Date Noted  . Urinary hesitancy 02/10/2017  . Primary hypercoagulable state ([place]) [D68.59] 02/09/2017  . Encounter for therapeutic drug monitoring 02/09/2017  . CAD (coronary artery disease) 02/09/2017  . Anticoagulated 02/09/2017    Jeanie Sewer PTA 04/27/2017, 11:05 AM  [facility] [address] [place], [place], [address] Phone: [phone]   Fax:  [phone]  [name] MRN: [id number] Date of Birth: 1946-01-27

## 2017-04-28 DIAGNOSIS — M5431 Sciatica, right side: Secondary | ICD-10-CM | POA: Diagnosis not present

## 2017-04-28 DIAGNOSIS — M5136 Other intervertebral disc degeneration, lumbar region: Secondary | ICD-10-CM | POA: Diagnosis not present

## 2017-04-28 DIAGNOSIS — M9905 Segmental and somatic dysfunction of pelvic region: Secondary | ICD-10-CM | POA: Diagnosis not present

## 2017-04-28 DIAGNOSIS — M9903 Segmental and somatic dysfunction of lumbar region: Secondary | ICD-10-CM | POA: Diagnosis not present

## 2017-04-28 DIAGNOSIS — M5134 Other intervertebral disc degeneration, thoracic region: Secondary | ICD-10-CM | POA: Diagnosis not present

## 2017-04-28 DIAGNOSIS — M9902 Segmental and somatic dysfunction of thoracic region: Secondary | ICD-10-CM | POA: Diagnosis not present

## 2017-05-01 ENCOUNTER — Ambulatory Visit: Payer: Medicare Other | Admitting: Physical Therapy

## 2017-05-01 ENCOUNTER — Ambulatory Visit (INDEPENDENT_AMBULATORY_CARE_PROVIDER_SITE_OTHER): Payer: Medicare Other | Admitting: *Deleted

## 2017-05-01 ENCOUNTER — Encounter: Payer: Self-pay | Admitting: Physical Therapy

## 2017-05-01 DIAGNOSIS — R2689 Other abnormalities of gait and mobility: Secondary | ICD-10-CM

## 2017-05-01 DIAGNOSIS — R279 Unspecified lack of coordination: Secondary | ICD-10-CM

## 2017-05-01 DIAGNOSIS — M6281 Muscle weakness (generalized): Secondary | ICD-10-CM

## 2017-05-01 DIAGNOSIS — R2681 Unsteadiness on feet: Secondary | ICD-10-CM | POA: Diagnosis not present

## 2017-05-01 DIAGNOSIS — Z5181 Encounter for therapeutic drug level monitoring: Secondary | ICD-10-CM | POA: Diagnosis not present

## 2017-05-01 DIAGNOSIS — D6859 Other primary thrombophilia: Secondary | ICD-10-CM | POA: Diagnosis not present

## 2017-05-01 DIAGNOSIS — I2510 Atherosclerotic heart disease of native coronary artery without angina pectoris: Secondary | ICD-10-CM

## 2017-05-01 LAB — POCT INR: INR: 3.6

## 2017-05-01 NOTE — Therapy (Signed)
[facility] [address] [place], [place], [address] Phone: [phone]   Fax:  [phone]  Physical Therapy Treatment  Patient Details  [name] MRN: [id number] Date of Birth: [birth year] Referring Provider: Dr.  Martinique  Encounter Date: 05/01/2017      PT End of Session - 05/01/17 1032    Visit Number 10   Number of Visits 10   Date for PT Re-Evaluation 05/26/17   Authorization Type  G code; KX modifiers at visit 15   PT Start Time 1012   PT Stop Time 1057   PT Time Calculation (min) 45 min   Activity Tolerance Patient tolerated treatment well   Behavior During Therapy [facility] for tasks assessed/performed      Past Medical History:  Diagnosis Date  . Allergy   . Arthritis   . 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 Cataracts, bilateral   . Chicken pox   . Dyslipidemia   . Hyperlipidemia   . Hypertension   . Sinus bradycardia     Past Surgical History:  Procedure Laterality Date  . BYPASS GRAFT  1996  . CATARACT EXTRACTION Right   . REFRACTIVE SURGERY Right   . SKIN BIOPSY    . UMBILICAL HERNIA REPAIR  2003    There were no vitals filed for this visit.      Subjective Assessment - 05/01/17 1016    Subjective I lifted heavy things 2 days ago and now today I hurt from my head to my shoulder blades and my hip.  I think the therapy has made a lot of difference so far.  I think my balance is better and I can get in/out of bed easier.  Less intense "bad spells".     Currently in Pain? Yes   Pain Score 4    Pain Location Neck   Pain Frequency Constant   Aggravating Factors  lifting heavy things            [facility] PT Assessment - 05/01/17 0001      Observation/Other Assessments   Activities of Balance Confidence Scale (ABC Scale)  80.3     Berg Balance Test   [place] comment: 31/56     Timed Up and Go Test   Manual TUG (seconds) 24                      [facility] Adult PT Treatment/Exercise - 05/01/17 0001      High Level Balance   High Level Balance Activities Side stepping  Tanden stance both sides with small heads turns   High Level Balance Comments SLS with opposite toes touch frwd/side/bk 3x light UE touch     Lumbar Exercises: Aerobic   Stationary Bike L3 x 15 min  Concurrent balance confidence activity verbally for G code     Knee/Hip Exercises: Standing   Heel Raises Both;1 set;20 reps   Hip Flexion Stengthening;Both;1 set;20 reps;Knee bent  VC for posture   Hip ADduction Strengthening;Both;1 set;20 reps   Hip ADduction Limitations 3#     Knee/Hip Exercises: Seated   Long Arc Quad Strengthening;Both;1 set;20 reps;Weights   Long Arc Quad Weight 3 lbs.                  PT Short Term Goals - 05/01/17 1115      PT SHORT TERM GOAL #1   Title The patient will express understanding of best  assistive device to decrease risk of falls as well as other safety considerations (lighting, throw rugs)  04/28/17   Status Achieved     PT SHORT TERM GOAL #2   Title The patient will have improved Timed up and Go test to 25 sec indicating greater ease with sit to stand   Status Achieved     PT SHORT TERM GOAL #3   Title BERG balance test improved from 29/56 to 33/56 indicating decreased risk of falls   Time 4   Period Weeks   Status On-going           PT Long Term Goals - May 31, 2017 1115      PT LONG TERM GOAL #1   Title The patient will be independent with safe self progession of HEP needed for further improvements in balance and strength  05/26/17   Time 8   Period Weeks   Status On-going     PT LONG TERM GOAL #2   Title The patient will have improved LE strength to grossly 4/5 needed for greater ease with sit to stand and up and down steps   Time 8   Period Weeks   Status On-going     PT LONG TERM GOAL #3   Title The patient will have improved Activities-Specific Balance Scale score from 78% to 85% confidence  with ADLss   Time 8   Period Weeks   Status On-going     PT LONG TERM GOAL #4   Title BERG balance score improved to 35/56 indicating decreased fall risk    Time 8   Period Weeks   Status On-going     PT LONG TERM GOAL #5   Title Timed up and Go score improved to 20 sec indicating improved gait speed, mobility with sit to stand and decreased fall risk   Time 8   Period Weeks   Status On-going               Plan - May 31, 2017 1055    Clinical Impression Statement The patient is progressing with standing postural alignment, fewer cues and therapist assist for balance recovery in standing and fewer rest breaks for fatigue or dizziness.  His ABC function outcome score has improved by 2 points.  The patient reports functional improvements at home and in the community.  Progressing with rehab goals, close to meeting additional goals.     Rehab Potential Good   Clinical Impairments Affecting Rehab Potential high risk of falls   PT Frequency 2x / week   PT Duration 8 weeks   PT Treatment/Interventions ADLs/Self Care Home Management;Therapeutic activities;Therapeutic exercise;Neuromuscular re-education;Stair training;Functional mobility training;Patient/family education;Manual techniques;Gait training   PT Next Visit Plan seated and standing strengthening as tolerated   Consulted and Agree with Plan of Care Patient      Patient will benefit from skilled therapeutic intervention in order to improve the following deficits and impairments:  Decreased balance, Decreased activity tolerance, Decreased strength, Decreased mobility, Difficulty walking, Postural dysfunction  Visit Diagnosis: Other abnormalities of gait and mobility  Muscle weakness (generalized)  Unsteadiness on feet  Unspecified lack of coordination       G-Codes - May 31, 2017 1110    Functional Assessment Tool Used (Outpatient Only) clinical judgement;  ABC scale   Functional Limitation Mobility: Walking and moving  around   Mobility: Walking and Moving Around Current Status (G4010) At least 60 percent but less than 80 percent impaired, limited or restricted   Mobility: Walking and Moving  Around Goal Status [phone]) At least 40 percent but less than 60 percent impaired, limited or restricted      Problem List Patient Active Problem List   Diagnosis Date Noted  . Urinary hesitancy 02/10/2017  . Primary hypercoagulable state ([place]) [D68.59] 02/09/2017  . Encounter for therapeutic drug monitoring 02/09/2017  . CAD (coronary artery disease) 02/09/2017  . Anticoagulated 02/09/2017   Ruben Im, PT 05/01/17 11:19 AM Phone: [phone] Fax: [phone]  Alvera Singh 05/01/2017, 11:19 AM  [facility] [address] [place], [place], [address] Phone: [phone]   Fax:  [phone]  [name] MRN: [id number] Date of Birth: [birth year]

## 2017-05-15 ENCOUNTER — Ambulatory Visit (INDEPENDENT_AMBULATORY_CARE_PROVIDER_SITE_OTHER): Payer: Medicare Other | Admitting: *Deleted

## 2017-05-15 ENCOUNTER — Ambulatory Visit: Payer: Medicare Other | Attending: Family Medicine | Admitting: Physical Therapy

## 2017-05-15 DIAGNOSIS — R2689 Other abnormalities of gait and mobility: Secondary | ICD-10-CM | POA: Insufficient documentation

## 2017-05-15 DIAGNOSIS — R2681 Unsteadiness on feet: Secondary | ICD-10-CM | POA: Insufficient documentation

## 2017-05-15 DIAGNOSIS — M6281 Muscle weakness (generalized): Secondary | ICD-10-CM | POA: Diagnosis not present

## 2017-05-15 DIAGNOSIS — I2510 Atherosclerotic heart disease of native coronary artery without angina pectoris: Secondary | ICD-10-CM | POA: Diagnosis not present

## 2017-05-15 DIAGNOSIS — D6859 Other primary thrombophilia: Secondary | ICD-10-CM

## 2017-05-15 DIAGNOSIS — R279 Unspecified lack of coordination: Secondary | ICD-10-CM | POA: Insufficient documentation

## 2017-05-15 DIAGNOSIS — Z5181 Encounter for therapeutic drug level monitoring: Secondary | ICD-10-CM | POA: Diagnosis not present

## 2017-05-15 LAB — POCT INR: INR: 3.8

## 2017-05-15 NOTE — Therapy (Signed)
[facility] [address] [place], [place], [address] Phone: [phone]   Fax:  [phone]  Physical Therapy Treatment  Patient Details  [name] MRN: [id number] Date of Birth: 29-Nov-1946 Referring Provider: Dr.  Martin[place]  Encounter Date: 05/15/2017      PT End of Session - 05/15/17 1011    Visit Number 11   Number of Visits 16   Date for PT Re-Evaluation 05/26/17   Authorization Type  G code; KX modifiers at visit 15   PT Start Time 0931   PT Stop Time 1011   PT Time Calculation (min) 40 min   Activity Tolerance Patient tolerated treatment well   Behavior During Therapy [facility] for tasks assessed/performed      Past Medical History:  Diagnosis Date  . Allergy   . Arthritis   . 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 Cataracts, bilateral   . Chicken pox   . Dyslipidemia   . Hyperlipidemia   . Hypertension   . Sinus bradycardia     Past Surgical History:  Procedure Laterality Date  . BYPASS GRAFT  1996  . CATARACT EXTRACTION Right   . REFRACTIVE SURGERY Right   . SKIN BIOPSY    . UMBILICAL HERNIA REPAIR  2003    There were no vitals filed for this visit.      Subjective Assessment - 05/15/17 0935    Subjective doing well; reports doing exercises "every other day."  missed PT x 2 weeks due to wife being out of town and pt didn't have a ride   Pertinent History heart bypass surgery 20 years ago;  hasn't driven since 7106   Patient Stated Goals regain balance; less of a hazard to my wife   Currently in Pain? Yes   Pain Score 2    Pain Location Sternum   Pain Orientation Anterior   Pain Type Chronic pain   Pain Frequency Constant   Aggravating Factors  lifting heavy things   Pain Relieving Factors standing up straight and taking deep breaths                         [facility] Adult PT Treatment/Exercise - 05/15/17 0938      Lumbar Exercises: Aerobic   Stationary Bike Nustep L3 x 10 min     Knee/Hip Exercises: Stretches   Passive Hamstring Stretch Both;3 reps;30 seconds   Passive Hamstring Stretch Limitations seated     Knee/Hip Exercises: Standing   Heel Raises Both;1 set;20 reps   Heel Raises Limitations toe raises x 20 reps     Knee/Hip Exercises: Seated   Long Arc Quad Strengthening;Both;1 set;Weights;10 reps   Long Arc Quad Weight 3 lbs.   Marching Limitations both; 10 reps; 3#     Shoulder Exercises: Seated   Row Both;20 reps;Theraband   Theraband Level (Shoulder Row) Level 3 (Green)   Horizontal ABduction Both;20 reps;Theraband   Theraband Level (Shoulder Horizontal ABduction) Level 3 (Green)   External Rotation Both;20 reps;Theraband   Theraband Level (Shoulder External Rotation) Level 3 (Green)                  PT Short Term Goals - 05/01/17 1115      PT SHORT TERM GOAL #1   Title The patient will express understanding of best assistive device to decrease risk of falls as well as other safety considerations (lighting, throw rugs)  04/28/17   Status Achieved     PT SHORT TERM GOAL #2   Title The patient will have improved Timed up and Go test to 25 sec indicating greater ease with sit to stand   Status Achieved     PT SHORT TERM GOAL #3   Title BERG balance test improved from 29/56 to 33/56 indicating decreased risk of falls   Time 4   Period Weeks   Status On-going           PT Long Term Goals - 05/01/17 1115      PT LONG TERM GOAL #1   Title The patient will be independent with safe self progession of HEP needed for further improvements in balance and strength  05/26/17   Time 8   Period Weeks   Status On-going     PT LONG TERM GOAL #2   Title The patient will have improved LE strength to grossly 4/5 needed for greater ease with sit to stand and up and down steps   Time 8   Period Weeks   Status On-going     PT LONG TERM GOAL #3   Title The patient will have improved Activities-Specific  Balance Scale score from 78% to 85% confidence with ADLss   Time 8   Period Weeks   Status On-going     PT LONG TERM GOAL #4   Title BERG balance score improved to 35/56 indicating decreased fall risk    Time 8   Period Weeks   Status On-going     PT LONG TERM GOAL #5   Title Timed up and Go score improved to 20 sec indicating improved gait speed, mobility with sit to stand and decreased fall risk   Time 8   Period Weeks   Status On-going               Plan - 05/15/17 1011    Clinical Impression Statement Pt tolerated session well, but had to switch to seated exercises due to c/o lightheadedness and dizziness (pt reports this is chronic and knows how to manage symptoms).  Progressing well towards goals.   PT Treatment/Interventions ADLs/Self Care Home Management;Therapeutic activities;Therapeutic exercise;Neuromuscular re-education;Stair training;Functional mobility training;Patient/family education;Manual techniques;Gait training   PT Next Visit Plan seated and standing strengthening as tolerated   Consulted and Agree with Plan of Care Patient      Patient will benefit from skilled therapeutic intervention in order to improve the following deficits and impairments:  Decreased balance, Decreased activity tolerance, Decreased strength, Decreased mobility, Difficulty walking, Postural dysfunction  Visit Diagnosis: Other abnormalities of gait and mobility  Muscle weakness (generalized)  Unsteadiness on feet  Unspecified lack of coordination     Problem List Patient Active Problem List   Diagnosis Date Noted  . Urinary hesitancy 02/10/2017  . Primary hypercoagulable state ([place]) [D68.59] 02/09/2017  . Encounter for therapeutic drug monitoring 02/09/2017  . CAD (coronary artery disease) 02/09/2017  . Anticoagulated 02/09/2017       Laureen Abrahams, PT, DPT 05/15/17 10:12 AM    [facility] [address] [place], [place], [address] Phone: [phone]   Fax:  [phone]  [name] MRN: [id number] Date of Birth: 16-Jan-1946

## 2017-05-18 ENCOUNTER — Ambulatory Visit: Payer: Medicare Other

## 2017-05-18 DIAGNOSIS — M6281 Muscle weakness (generalized): Secondary | ICD-10-CM

## 2017-05-18 DIAGNOSIS — M9902 Segmental and somatic dysfunction of thoracic region: Secondary | ICD-10-CM | POA: Diagnosis not present

## 2017-05-18 DIAGNOSIS — M9903 Segmental and somatic dysfunction of lumbar region: Secondary | ICD-10-CM | POA: Diagnosis not present

## 2017-05-18 DIAGNOSIS — R2681 Unsteadiness on feet: Secondary | ICD-10-CM

## 2017-05-18 DIAGNOSIS — M9905 Segmental and somatic dysfunction of pelvic region: Secondary | ICD-10-CM | POA: Diagnosis not present

## 2017-05-18 DIAGNOSIS — M5134 Other intervertebral disc degeneration, thoracic region: Secondary | ICD-10-CM | POA: Diagnosis not present

## 2017-05-18 DIAGNOSIS — R2689 Other abnormalities of gait and mobility: Secondary | ICD-10-CM

## 2017-05-18 DIAGNOSIS — M5136 Other intervertebral disc degeneration, lumbar region: Secondary | ICD-10-CM | POA: Diagnosis not present

## 2017-05-18 DIAGNOSIS — R279 Unspecified lack of coordination: Secondary | ICD-10-CM | POA: Diagnosis not present

## 2017-05-18 DIAGNOSIS — M5431 Sciatica, right side: Secondary | ICD-10-CM | POA: Diagnosis not present

## 2017-05-18 NOTE — Therapy (Signed)
[facility] [address], [place], [place], [address] Phone: [phone]   Fax:  [phone]  Physical Therapy Treatment  Patient Details  [name] Date of Birth: 13-Nov-1946 Referring Provider: Dr.  Martinique  Encounter Date: 05/18/2017      PT End of Session - 05/18/17 1217    Visit Number 12   Number of Visits 20   Date for PT Re-Evaluation 05/26/17   Authorization Type  G code; KX modifiers at visit 15   PT Start Time 1145   PT Stop Time 1225   PT Time Calculation (min) 40 min   Activity Tolerance Patient tolerated treatment well   Behavior During Therapy [facility] for tasks assessed/performed      Past Medical History:  Diagnosis Date  . Allergy   . Arthritis   . 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 Cataracts, bilateral   . Chicken pox   . Dyslipidemia   . Hyperlipidemia   . Hypertension   . Sinus bradycardia     Past Surgical History:  Procedure Laterality Date  . BYPASS GRAFT  1996  . CATARACT EXTRACTION Right   . REFRACTIVE SURGERY Right   . SKIN BIOPSY    . UMBILICAL HERNIA REPAIR  2003    There were no vitals filed for this visit.                       [facility] Adult PT Treatment/Exercise - 05/18/17 0001      Exercises   Exercises Shoulder     Lumbar Exercises: Aerobic   Stationary Bike Nustep L3 x 10 min   UBE (Upper Arm Bike) --     Knee/Hip Exercises: Stretches   Active Hamstring Stretch Both;3 reps;20 seconds     Knee/Hip Exercises: Standing   Heel Raises Both;1 set;20 reps   Heel Raises Limitations toe raises x 20 reps   Hip Flexion Stengthening;Both;1 set;20 reps;Knee bent  VC for posture   Hip Flexion Limitations 3#   Hip Abduction Stengthening;Both;2 sets;10 reps   Abduction Limitations 3#     Knee/Hip Exercises: Seated   Long Arc Quad Strengthening;Both;1 set;Weights;10 reps   Long Arc Quad Weight 3 lbs.   Marching Limitations both; 10 reps; 3#                  PT Short Term Goals - 05/18/17 1151      PT SHORT TERM GOAL #3   Title BERG balance test improved from 29/56 to 33/56 indicating decreased risk of falls   Period Weeks   Status On-going           PT Long Term Goals - 05/18/17 1151      PT LONG TERM GOAL #1   Title The patient will be independent with safe self progession of HEP needed for further improvements in balance and strength  05/26/17   Time 8   Period Weeks   Status On-going     PT LONG TERM GOAL #4   Title BERG balance score improved to 35/56 indicating decreased fall risk    Time 8   Period Weeks   Status On-going               Plan - 05/18/17 1204    Clinical Impression Statement Pt continues to ambulate with wide base of support.  Pt able to tolerate all exercisess today  without limitation.  Pt able to tolerate addition of ankle weights with standing exercise today.  Pt reports that he feels more confident with gait in the community now.  Pt will continue to benefit from skilled PT for strength, endurance, gait, and balance training.     Rehab Potential Good   Clinical Impairments Affecting Rehab Potential high risk of falls   PT Frequency 2x / week   PT Duration 8 weeks   PT Treatment/Interventions ADLs/Self Care Home Management;Therapeutic activities;Therapeutic exercise;Neuromuscular re-education;Stair training;Functional mobility training;Patient/family education;Manual techniques;Gait training   PT Next Visit Plan seated and standing strengthening as tolerated. try arm bike. Test [employer] and Agree with Plan of Care Patient      Patient will benefit from skilled therapeutic intervention in order to improve the following deficits and impairments:  Decreased balance, Decreased activity tolerance, Decreased strength, Decreased mobility, Difficulty walking, Postural dysfunction  Visit Diagnosis: Other abnormalities of gait and  mobility  Muscle weakness (generalized)  Unsteadiness on feet  Unspecified lack of coordination     Problem List Patient Active Problem List   Diagnosis Date Noted  . Urinary hesitancy 02/10/2017  . Primary hypercoagulable state ([place]) [D68.59] 02/09/2017  . Encounter for therapeutic drug monitoring 02/09/2017  . CAD (coronary artery disease) 02/09/2017  . Anticoagulated 02/09/2017     [name], PT 05/18/17 12:21 PM  [facility] [address] [place], [place], [address] Phone: [phone]   Fax:  [phone]  [name] Date of Birth: 1945/12/18

## 2017-05-22 ENCOUNTER — Encounter: Payer: Self-pay | Admitting: Physical Therapy

## 2017-05-22 ENCOUNTER — Ambulatory Visit: Payer: Medicare Other | Admitting: Physical Therapy

## 2017-05-22 DIAGNOSIS — R2681 Unsteadiness on feet: Secondary | ICD-10-CM | POA: Diagnosis not present

## 2017-05-22 DIAGNOSIS — R279 Unspecified lack of coordination: Secondary | ICD-10-CM | POA: Diagnosis not present

## 2017-05-22 DIAGNOSIS — M6281 Muscle weakness (generalized): Secondary | ICD-10-CM | POA: Diagnosis not present

## 2017-05-22 DIAGNOSIS — R2689 Other abnormalities of gait and mobility: Secondary | ICD-10-CM | POA: Diagnosis not present

## 2017-05-22 NOTE — Therapy (Signed)
[facility] [address] [place], [place], [address] Phone: [phone]   Fax:  [phone]  Physical Therapy Treatment  Patient Details  [name] MRN: [id number] Date of Birth: [birth year] Referring Provider: Dr.  [place]  Encounter Date: 05/22/2017      PT End of Session - 05/22/17 1020    Visit Number 13   Number of Visits 20   Date for PT Re-Evaluation 05/26/17   Authorization Type  G code; KX modifiers at visit 15   PT Start Time 1013   PT Stop Time 1052   PT Time Calculation (min) 39 min   Activity Tolerance Patient tolerated treatment well   Behavior During Therapy [facility] for tasks assessed/performed      Past Medical History:  Diagnosis Date  . Allergy   . Arthritis   . 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 Cataracts, bilateral   . Chicken pox   . Dyslipidemia   . Hyperlipidemia   . Hypertension   . Sinus bradycardia     Past Surgical History:  Procedure Laterality Date  . BYPASS GRAFT  1996  . CATARACT EXTRACTION Right   . REFRACTIVE SURGERY Right   . SKIN BIOPSY    . UMBILICAL HERNIA REPAIR  2003    There were no vitals filed for this visit.      Subjective Assessment - 05/22/17 1021    Subjective HAve done lifting, unpacking and all sorts of activities getting ready for yard sale and I did well.  My muscles are just sore.    Pertinent History heart bypass surgery 20 years ago;  hasn't driven since 7035   How long can you walk comfortably? varies from all day with no cane   Diagnostic tests MRI head in 2 weeks;  MRI thoracic to lumbar in past shows scoliosis and arthritis;  very low Vit D then too much; hi calcium   Patient Stated Goals regain balance; less of a hazard to my wife   Currently in Pain? No/denies   Multiple Pain Sites No            [facility] PT Assessment - 05/22/17 0001      Berg Balance Test   Sit to Stand Able to stand without using hands  and stabilize independently   Standing Unsupported Able to stand safely 2 minutes   Sitting with Back Unsupported but Feet Supported on Floor or Stool Able to sit safely and securely 2 minutes   Stand to Sit Sits safely with minimal use of hands   Transfers Able to transfer safely, minor use of hands   Standing Unsupported with Eyes Closed Able to stand 10 seconds with supervision   Standing Ubsupported with Feet Together Able to place feet together independently and stand for 1 minute with supervision   From Standing, Reach Forward with Outstretched Arm Can reach confidently >25 cm (10")   From Standing Position, Pick up Object from [place] to pick up shoe safely and easily   From Standing Position, Turn to Look Behind Over each Shoulder Looks behind one side only/other side shows less weight shift   Turn 360 Degrees Able to turn 360 degrees safely but slowly   Standing Unsupported, Alternately Place Feet on Step/Stool Able to stand independently and safely and complete 8 steps in 20 seconds   Standing Unsupported, One Foot in Front Able to take small step independently and hold 30 seconds  Standing on One Leg Tries to lift leg/unable to hold 3 seconds but remains standing independently   Total Score 46   Berg comment: 46/56     Timed Up and Go Test   Manual TUG (seconds) 19                     [facility] Adult PT Treatment/Exercise - 05/22/17 0001      Lumbar Exercises: Aerobic   Stationary Bike L4 x 10 min                  PT Short Term Goals - 05/22/17 1047      PT SHORT TERM GOAL #3   Title BERG balance test improved from 29/56 to 33/56 indicating decreased risk of falls   Baseline 31/56   Time 4   Status Achieved           PT Long Term Goals - 05/22/17 1048      PT LONG TERM GOAL #4   Title BERG balance score improved to 35/56 indicating decreased fall risk    Time 8   Period Weeks   Status Achieved  46/56     PT LONG TERM GOAL #5   Title  Timed up and Go score improved to 20 sec indicating improved gait speed, mobility with sit to stand and decreased fall risk   Time 8   Period Weeks   Status Achieved  19 sec               Plan - 05/22/17 1020    Clinical Impression Statement Pt increased both scores in the TUG and BERG, meeting the long term goals of each. Pt is compliant with his HEP and reports his confidence is much higher. In fact, pt in the past week has been able to put an organ together and pack/unpack  many boxes  without difficulty, just sore muscles.    Rehab Potential Good   Clinical Impairments Affecting Rehab Potential high risk of falls   PT Frequency 2x / week   PT Duration 8 weeks   PT Treatment/Interventions ADLs/Self Care Home Management;Therapeutic activities;Therapeutic exercise;Neuromuscular re-education;Stair training;Functional mobility training;Patient/family education;Manual techniques;Gait training   PT Next Visit Plan ERO next visit. Pt may need some higher level balance exs for final HEP.    Consulted and Agree with Plan of Care Patient      Patient will benefit from skilled therapeutic intervention in order to improve the following deficits and impairments:  Decreased balance, Decreased activity tolerance, Decreased strength, Decreased mobility, Difficulty walking, Postural dysfunction  Visit Diagnosis: Other abnormalities of gait and mobility  Muscle weakness (generalized)  Unsteadiness on feet  Unspecified lack of coordination     Problem List Patient Active Problem List   Diagnosis Date Noted  . Urinary hesitancy 02/10/2017  . Primary hypercoagulable state ([place]) [D68.59] 02/09/2017  . Encounter for therapeutic drug monitoring 02/09/2017  . CAD (coronary artery disease) 02/09/2017  . Anticoagulated 02/09/2017    [name], PTA 05/22/2017, 11:04 AM  [facility] [address], [place], [place],  [address] Phone: [phone]   Fax:  [phone]  [name] MRN: [id number] Date of Birth: 1946-09-18

## 2017-05-25 ENCOUNTER — Ambulatory Visit: Payer: Medicare Other | Admitting: Physical Therapy

## 2017-05-25 DIAGNOSIS — M6281 Muscle weakness (generalized): Secondary | ICD-10-CM | POA: Diagnosis not present

## 2017-05-25 DIAGNOSIS — R2689 Other abnormalities of gait and mobility: Secondary | ICD-10-CM

## 2017-05-25 DIAGNOSIS — R2681 Unsteadiness on feet: Secondary | ICD-10-CM

## 2017-05-25 DIAGNOSIS — R279 Unspecified lack of coordination: Secondary | ICD-10-CM

## 2017-05-25 NOTE — Therapy (Signed)
[facility] [address], [place], [place], [address] Phone: [phone]   Fax:  [phone]  Physical Therapy Treatment  Patient Details  [name] MRN: [id number] Date of Birth: 10-20-1946 Referring Provider: Dr. Betty Martinique  Encounter Date: 05/25/2017      PT End of Session - 05/25/17 1025    Visit Number 14   Date for PT Re-Evaluation 07/21/17   Authorization Type  G code; KX modifiers at visit 15   PT Start Time 1015   PT Stop Time 1055   PT Time Calculation (min) 40 min   Activity Tolerance Patient tolerated treatment well   Behavior During Therapy [facility] for tasks assessed/performed      Past Medical History:  Diagnosis Date  . Allergy   . Arthritis   . 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 Cataracts, bilateral   . Chicken pox   . Dyslipidemia   . Hyperlipidemia   . Hypertension   . Sinus bradycardia     Past Surgical History:  Procedure Laterality Date  . BYPASS GRAFT  1996  . CATARACT EXTRACTION Right   . REFRACTIVE SURGERY Right   . SKIN BIOPSY    . UMBILICAL HERNIA REPAIR  2003    There were no vitals filed for this visit.          [facility] PT Assessment - 05/25/17 0001      Assessment   Medical Diagnosis unstable gait   Referring Provider Dr. Betty Martinique   Prior Therapy sees chiro 2x/week for traction lumbar, massage and spinal manipulation seems to be helping     Precautions   Precautions Fall     Restrictions   Weight Bearing Restrictions No     Balance Screen   Has the patient fallen in the past 6 months No   Has the patient had a decrease in activity level because of a fear of falling?  No   Is the patient reluctant to leave their home because of a fear of falling?  No     Home Ecologist residence     Prior Function   Level of Independence Independent     Cognition   Overall Cognitive Status Within  Functional Limits for tasks assessed     Posture/Postural Control   Posture/Postural Control Postural limitations   Postural Limitations Rounded Shoulders;Forward head;Increased thoracic kyphosis     Strength   Right/Left Hip --  bil. hip abduction 4/5, bil. hip adduction 4/5   Right/Left Knee --  bil. knee 5/5     Transfers   Transfers Not assessed     Ambulation/Gait   Ambulation/Gait Yes   Ambulation/Gait Assistance 6: Modified independent (Device/Increase time)   Assistive device Straight cane   Gait Pattern --  wide base of support     Berg Balance Test   Sit to Stand Able to stand without using hands and stabilize independently   Standing Unsupported Able to stand safely 2 minutes   Sitting with Back Unsupported but Feet Supported on Floor or Stool Able to sit safely and securely 2 minutes   Stand to Sit Sits safely with minimal use of hands   Transfers Able to transfer safely, minor use of hands   Standing Unsupported with Eyes Closed Able to stand 10 seconds safely   Standing Ubsupported with Feet Together Able to place feet together independently and stand for 1 minute with  supervision   From Standing, Reach Forward with Outstretched Arm Can reach confidently >25 cm (10")   From Standing Position, Pick up Object from [place] to pick up shoe safely and easily   From Standing Position, Turn to Look Behind Over each Shoulder Looks behind one side only/other side shows less weight shift   Turn 360 Degrees Able to turn 360 degrees safely but slowly   Standing Unsupported, Alternately Place Feet on Step/Stool Able to stand independently and safely and complete 8 steps in 20 seconds   Standing Unsupported, One Foot in Front Able to take small step independently and hold 30 seconds   Standing on One Leg Tries to lift leg/unable to hold 3 seconds but remains standing independently   Total Score 47   Berg comment: 47/56 indicating 50% chance of falls     Timed Up and Go Test    TUG Normal TUG   Normal TUG (seconds) 15  used cane and no cushino on chair                     [facility] Adult PT Treatment/Exercise - 05/25/17 0001      Lumbar Exercises: Aerobic   Stationary Bike L4 x 10 min     Knee/Hip Exercises: Standing   Heel Raises Both;1 set;20 reps  then 10 on each foot; holding on    Heel Raises Limitations toe raises x 20 reps   Hip Flexion Stengthening;Both;Knee bent;2 sets;10 reps  VC for posture   Hip Flexion Limitations 3#   Hip Abduction Stengthening;Both;2 sets;10 reps   Abduction Limitations 3#   Hip Extension Stengthening;Right;Left;2 sets;10 reps   Functional Squat 1 set;10 reps  holding onto treadmill                  PT Short Term Goals - 05/25/17 1025      PT SHORT TERM GOAL #1   Title The patient will express understanding of best assistive device to decrease risk of falls as well as other safety considerations (lighting, throw rugs)  04/28/17   Time 4   Period Weeks   Status Achieved     PT SHORT TERM GOAL #2   Title The patient will have improved Timed up and Go test to 25 sec indicating greater ease with sit to stand   Time 4   Period Weeks   Status Achieved     PT SHORT TERM GOAL #3   Title BERG balance test improved from 29/56 to 33/56 indicating decreased risk of falls   Time 4   Period Weeks   Status Achieved           PT Long Term Goals - 05/25/17 1027      PT LONG TERM GOAL #1   Title The patient will be independent with safe self progession of HEP needed for further improvements in balance and strength  05/26/17   Time 8   Period Weeks   Status On-going     PT LONG TERM GOAL #2   Title The patient will have improved LE strength to grossly 4/5 needed for greater ease with sit to stand and up and down steps   Time 8   Period Weeks   Status Achieved     PT LONG TERM GOAL #3   Title The patient will have improved Activities-Specific Balance Scale score from 78% to 85% confidence with  ADLss   Time 8   Period Weeks   Status  On-going     PT LONG TERM GOAL #4   Title BERG balance score improved to 50/56 indicating decreased fall risk    Time 8   Period Weeks   Status Revised     PT LONG TERM GOAL #5   Title Timed up and Go score improved to 13 sec indicating improved gait speed, mobility with sit to stand and decreased fall risk with a cane   Time 8   Period Weeks   Status Revised     Additional Long Term Goals   Additional Long Term Goals Yes     PT LONG TERM GOAL #6   Title stand with one foot ahead of the other for 30 seconds due to increased hip strength and balance so he is able to walk through a smaller doorway   Time 8   Period Weeks   Status New               Plan - June 21, 2017 1028    Clinical Impression Statement Patient balance has increase to 47/56 due to increase balance but he is still feeling unsteady.  Patient walks with a wide base of support.  Patient is unsteady with single leg stance and tandem stance making it difficulty with walking. Patient TUG score is 15 sec still putting him at risk of falls.  Patient is able to walk with increased step length and wide base of support.  Patient has increased confidence with walking by not holding onto the wall.Patient will benefit from skilled therapy to improve strength and balance to reduce risk of falls and perfrom daily tasks with increased endurance.     Rehab Potential Good   Clinical Impairments Affecting Rehab Potential high risk of falls   PT Frequency 2x / week   PT Duration 8 weeks   PT Treatment/Interventions ADLs/Self Care Home Management;Therapeutic activities;Therapeutic exercise;Neuromuscular re-education;Stair training;Functional mobility training;Patient/family education;Manual techniques;Gait training   PT Next Visit Plan KX modifier next visit; update HEP   PT Home Exercise Plan update HEP   Recommended Other Services initial cert signed by MD   Consulted and Agree with Plan of  Care Patient      Patient will benefit from skilled therapeutic intervention in order to improve the following deficits and impairments:  Decreased balance, Decreased activity tolerance, Decreased strength, Decreased mobility, Difficulty walking, Postural dysfunction  Visit Diagnosis: Other abnormalities of gait and mobility - Plan: PT plan of care cert/re-cert  Muscle weakness (generalized) - Plan: PT plan of care cert/re-cert  Unsteadiness on feet - Plan: PT plan of care cert/re-cert  Unspecified lack of coordination - Plan: PT plan of care cert/re-cert       G-Codes - 2017/06/21 1024    Functional Assessment Tool Used (Outpatient Only) --   Functional Limitation --   Mobility: Walking and Moving Around Goal Status (W6568) --   Mobility: Walking and Moving Around Discharge Status [phone]) --      Problem List Patient Active Problem List   Diagnosis Date Noted  . Urinary hesitancy 02/10/2017  . Primary hypercoagulable state ([place]) [D68.59] 02/09/2017  . Encounter for therapeutic drug monitoring 02/09/2017  . CAD (coronary artery disease) 02/09/2017  . Anticoagulated 02/09/2017    Earlie Counts, PT June 21, 2017 10:58 AM   [facility] [address] [place], [place], [address] Phone: [phone]   Fax:  [phone]  [name] MRN: [id number] Date of Birth: [birth year]

## 2017-05-29 ENCOUNTER — Ambulatory Visit (INDEPENDENT_AMBULATORY_CARE_PROVIDER_SITE_OTHER): Payer: Medicare Other | Admitting: *Deleted

## 2017-05-29 ENCOUNTER — Encounter: Payer: Self-pay | Admitting: Neurology

## 2017-05-29 ENCOUNTER — Ambulatory Visit (INDEPENDENT_AMBULATORY_CARE_PROVIDER_SITE_OTHER): Payer: Medicare Other | Admitting: Neurology

## 2017-05-29 VITALS — BP 128/72 | HR 80 | Ht 75.0 in | Wt 197.0 lb

## 2017-05-29 DIAGNOSIS — R2681 Unsteadiness on feet: Secondary | ICD-10-CM

## 2017-05-29 DIAGNOSIS — I2510 Atherosclerotic heart disease of native coronary artery without angina pectoris: Secondary | ICD-10-CM | POA: Diagnosis not present

## 2017-05-29 DIAGNOSIS — M546 Pain in thoracic spine: Secondary | ICD-10-CM

## 2017-05-29 DIAGNOSIS — Z5181 Encounter for therapeutic drug level monitoring: Secondary | ICD-10-CM | POA: Diagnosis not present

## 2017-05-29 DIAGNOSIS — R7989 Other specified abnormal findings of blood chemistry: Secondary | ICD-10-CM | POA: Diagnosis not present

## 2017-05-29 DIAGNOSIS — G609 Hereditary and idiopathic neuropathy, unspecified: Secondary | ICD-10-CM | POA: Diagnosis not present

## 2017-05-29 DIAGNOSIS — D6859 Other primary thrombophilia: Secondary | ICD-10-CM

## 2017-05-29 DIAGNOSIS — R0789 Other chest pain: Secondary | ICD-10-CM | POA: Diagnosis not present

## 2017-05-29 DIAGNOSIS — M542 Cervicalgia: Secondary | ICD-10-CM | POA: Diagnosis not present

## 2017-05-29 DIAGNOSIS — E538 Deficiency of other specified B group vitamins: Secondary | ICD-10-CM

## 2017-05-29 LAB — POCT INR: INR: 2.6

## 2017-05-29 MED ORDER — DIAZEPAM 5 MG PO TABS: ORAL_TABLET | ORAL | 0 refills | Status: DC

## 2017-05-29 NOTE — Patient Instructions (Addendum)
1.  We will check MRI of cervical and thoracic spine without contrast. We have sent a referral to [facility] for your MRI and they will call you directly to schedule your appt. They are located at [place]. If you need to contact them directly please call [phone]. We have given you a prescriptions for valium. Take one tablet 30 minutes prior to MR (on arrival to facility) and one more just prior to MR (if needed)  2.  We will check a nerve study of the legs.  3.  Start vitamin B12 1037mcg daily  4.  Stop unpacking  5.  Continue physical therapy  6.  Follow up after testing.
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
## 2017-06-03 ENCOUNTER — Ambulatory Visit: Payer: Medicare Other

## 2017-06-03 DIAGNOSIS — R2681 Unsteadiness on feet: Secondary | ICD-10-CM | POA: Diagnosis not present

## 2017-06-03 DIAGNOSIS — M5134 Other intervertebral disc degeneration, thoracic region: Secondary | ICD-10-CM | POA: Diagnosis not present

## 2017-06-03 DIAGNOSIS — M5431 Sciatica, right side: Secondary | ICD-10-CM | POA: Diagnosis not present

## 2017-06-03 DIAGNOSIS — R279 Unspecified lack of coordination: Secondary | ICD-10-CM | POA: Diagnosis not present

## 2017-06-03 DIAGNOSIS — M5136 Other intervertebral disc degeneration, lumbar region: Secondary | ICD-10-CM | POA: Diagnosis not present

## 2017-06-03 DIAGNOSIS — M6281 Muscle weakness (generalized): Secondary | ICD-10-CM

## 2017-06-03 DIAGNOSIS — M9905 Segmental and somatic dysfunction of pelvic region: Secondary | ICD-10-CM | POA: Diagnosis not present

## 2017-06-03 DIAGNOSIS — R2689 Other abnormalities of gait and mobility: Secondary | ICD-10-CM | POA: Diagnosis not present

## 2017-06-03 DIAGNOSIS — M9903 Segmental and somatic dysfunction of lumbar region: Secondary | ICD-10-CM | POA: Diagnosis not present

## 2017-06-03 DIAGNOSIS — M9902 Segmental and somatic dysfunction of thoracic region: Secondary | ICD-10-CM | POA: Diagnosis not present

## 2017-06-03 NOTE — Therapy (Signed)
[facility] [address], [place], [place], [address] Phone: [phone]   Fax:  [phone]  Physical Therapy Treatment  Patient Details  [name] MRN: [id number] Date of Birth: Nov 07, 1946 Referring Provider: Dr. Betty Martinique  Encounter Date: 06/03/2017      PT End of Session - 06/03/17 1113    Visit Number 15   Date for PT Re-Evaluation 07/21/17   Authorization Type  G code; KX modifiers at visit 15   PT Start Time 1104  pt. arrived late    PT Stop Time 1144   PT Time Calculation (min) 40 min   Activity Tolerance Patient tolerated treatment well   Behavior During Therapy WFL for tasks assessed/performed      Past Medical History:  Diagnosis Date  . Allergy   . Arthritis   . 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 Cataracts, bilateral   . Chicken pox   . Dyslipidemia   . Hyperlipidemia   . Hypertension   . Sinus bradycardia     Past Surgical History:  Procedure Laterality Date  . BYPASS GRAFT  1996  . CATARACT EXTRACTION Right   . REFRACTIVE SURGERY Right   . SKIN BIOPSY    . UMBILICAL HERNIA REPAIR  2003    There were no vitals filed for this visit.      Subjective Assessment - 06/03/17 1109    Subjective Pt. notes he moved boxes for yard sale and this has given him back and neck pain.     Patient Stated Goals regain balance; less of a hazard to my wife   Currently in Pain? Yes   Pain Score 4    Pain Location Neck   Pain Orientation Right   Pain Frequency Intermittent   Aggravating Factors  Moving boxes for yard sale    Pain Relieving Factors ice    Multiple Pain Sites Yes   Pain Score 6   Pain Location Scapula   Pain Orientation Right   Pain Descriptors / Indicators --  "bruised and pulled"   Aggravating Factors  moving boxes for yardsale                          [facility] Adult PT Treatment/Exercise - 06/03/17 1115      Neuro Re-ed    Neuro Re-ed Details  B tandem stance with head turns x 1 min each way; B staggered stance with head turns x 1 min each way; Narrow stance with diagonal head turns x 1 min; at counter with 1 UE support: Tandem walk forward x 2 laps down back; Tandem walk forward/backward x 2 laps     Lumbar Exercises: Aerobic   Stationary Bike L4 x 10 min     Knee/Hip Exercises: Seated   Sit to Sand 3 sets;10 reps;with UE support  1 UE pushoff from knees      Shoulder Exercises: Stretch   Other Shoulder Stretches Seated rhomboids stretch 2 x 30 sec; pt. reporting relief from mid back pain following this                PT Education - 06/03/17 1348    Education provided Yes   Education Details HEP updated to include tandem stance, SLS, and staggered stance at chair    Person(s) Educated Patient   Methods Explanation;Demonstration;Verbal cues;Handout   Comprehension Verbalized understanding;Returned demonstration;Verbal cues required;Need further instruction  PT Short Term Goals - 05/25/17 1025      PT SHORT TERM GOAL #1   Title The patient will express understanding of best assistive device to decrease risk of falls as well as other safety considerations (lighting, throw rugs)  04/28/17   Time 4   Period Weeks   Status Achieved     PT SHORT TERM GOAL #2   Title The patient will have improved Timed up and Go test to 25 sec indicating greater ease with sit to stand   Time 4   Period Weeks   Status Achieved     PT SHORT TERM GOAL #3   Title BERG balance test improved from 29/56 to 33/56 indicating decreased risk of falls   Time 4   Period Weeks   Status Achieved           PT Long Term Goals - 05/25/17 1027      PT LONG TERM GOAL #1   Title The patient will be independent with safe self progession of HEP needed for further improvements in balance and strength  05/26/17   Time 8   Period Weeks   Status On-going     PT LONG TERM GOAL #2   Title The patient will have  improved LE strength to grossly 4/5 needed for greater ease with sit to stand and up and down steps   Time 8   Period Weeks   Status Achieved     PT LONG TERM GOAL #3   Title The patient will have improved Activities-Specific Balance Scale score from 78% to 85% confidence with ADLss   Time 8   Period Weeks   Status On-going     PT LONG TERM GOAL #4   Title BERG balance score improved to 50/56 indicating decreased fall risk    Time 8   Period Weeks   Status Revised     PT LONG TERM GOAL #5   Title Timed up and Go score improved to 13 sec indicating improved gait speed, mobility with sit to stand and decreased fall risk with a cane   Time 8   Period Weeks   Status Revised     Additional Long Term Goals   Additional Long Term Goals Yes     PT LONG TERM GOAL #6   Title stand with one foot ahead of the other for 30 seconds due to increased hip strength and balance so he is able to walk through a smaller doorway   Time 8   Period Weeks   Status New               Plan - 06/03/17 1122    Clinical Impression Statement Pt. with some neck and back pain to start treatment today which he feels is from moving boxes during yard sale over weekend.  This pain resolved with treatment today.  Balance activity focusing on narrow BOS with staggered and tandem stance activities.  HEP updated to include more balance activities.     PT Treatment/Interventions ADLs/Self Care Home Management;Therapeutic activities;Therapeutic exercise;Neuromuscular re-education;Stair training;Functional mobility training;Patient/family education;Manual techniques;Gait training   PT Next Visit Plan KX modifie; tolerance for updated HEP      Patient will benefit from skilled therapeutic intervention in order to improve the following deficits and impairments:  Decreased balance, Decreased activity tolerance, Decreased strength, Decreased mobility, Difficulty walking, Postural dysfunction  Visit Diagnosis: Other  abnormalities of gait and mobility  Muscle weakness (generalized)  Unsteadiness on feet  Unspecified lack of coordination     Problem List Patient Active Problem List   Diagnosis Date Noted  . Urinary hesitancy 02/10/2017  . Primary hypercoagulable state ([place]) [D68.59] 02/09/2017  . Encounter for therapeutic drug monitoring 02/09/2017  . CAD (coronary artery disease) 02/09/2017  . Anticoagulated 02/09/2017   Bess Harvest, PTA 06/03/17 1:52 PM  [facility] [address] [place], [place], [address] Phone: [phone]   Fax:  [phone]  [name] MRN: [id number] Date of Birth: 1946/08/11

## 2017-06-08 ENCOUNTER — Encounter: Payer: Self-pay | Admitting: Physical Therapy

## 2017-06-08 ENCOUNTER — Ambulatory Visit: Payer: Medicare Other | Attending: Family Medicine | Admitting: Physical Therapy

## 2017-06-08 ENCOUNTER — Encounter: Payer: Self-pay | Admitting: Neurology

## 2017-06-08 DIAGNOSIS — R279 Unspecified lack of coordination: Secondary | ICD-10-CM

## 2017-06-08 DIAGNOSIS — M6281 Muscle weakness (generalized): Secondary | ICD-10-CM

## 2017-06-08 DIAGNOSIS — R2681 Unsteadiness on feet: Secondary | ICD-10-CM | POA: Diagnosis not present

## 2017-06-08 DIAGNOSIS — R2689 Other abnormalities of gait and mobility: Secondary | ICD-10-CM

## 2017-06-08 NOTE — Therapy (Signed)
[facility] [address] [place], [place], [address] Phone: [phone]   Fax:  [phone]  Physical Therapy Treatment  Patient Details  [name] MRN: [id number] Date of Birth: 1946/06/20 Referring Provider: Dr. Betty Martinique  Encounter Date: 06/08/2017      PT End of Session - 06/08/17 1445    Visit Number 16   Number of Visits 20   Date for PT Re-Evaluation 07/21/17   Authorization Type  G code; KX modifiers at visit 15   PT Start Time 7902   PT Stop Time 1525   PT Time Calculation (min) 40 min   Activity Tolerance Patient tolerated treatment well   Behavior During Therapy [facility] for tasks assessed/performed      Past Medical History:  Diagnosis Date  . Allergy   . Arthritis   . 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 Cataracts, bilateral   . Chicken pox   . Dyslipidemia   . Hyperlipidemia   . Hypertension   . Sinus bradycardia     Past Surgical History:  Procedure Laterality Date  . BYPASS GRAFT  1996  . CATARACT EXTRACTION Right   . REFRACTIVE SURGERY Right   . SKIN BIOPSY    . UMBILICAL HERNIA REPAIR  2003    There were no vitals filed for this visit.      Subjective Assessment - 06/08/17 1447    Subjective Still using ice & heat to his neck and back. I was successful doing splt step wide at home, but I can not do balance on leg or hell to toe, I fall over. I am walkiing 1/4 mile day with the least ammount of cane touches.    Pertinent History heart bypass surgery 20 years ago;  hasn't driven since 4097   Limitations Walking;House hold activities;Standing   How long can you walk comfortably? varies from all day with no cane   Patient Stated Goals regain balance; less of a hazard to my wife   Currently in Pain? Yes   Pain Score 4    Pain Location --  Neck and back are sore still   Aggravating Factors  When i moved boxes   Pain Relieving Factors ice & heat    Multiple Pain Sites No                         [facility] Adult PT Treatment/Exercise - 06/08/17 0001      Neuro Re-ed    Neuro Re-ed Details  Walking without cane around gym with head turns and nods 3 laps before needing to sit.   2x, then sidestepping without UE support, f/b tandem walking     Lumbar Exercises: Aerobic   Stationary Bike L4 x 10 min  review of status     Knee/Hip Exercises: Standing   Hip Flexion Stengthening;Both;1 set;20 reps;Knee bent   Hip Flexion Limitations 4#   Hip Abduction Stengthening;Both;2 sets;10 reps   Abduction Limitations 4#   Hip Extension Stengthening;Right;Left;2 sets;10 reps;Knee straight   Extension Limitations 4#                  PT Short Term Goals - 05/25/17 1025      PT SHORT TERM GOAL #1   Title The patient will express understanding of best assistive device to decrease risk of falls as well as other safety considerations (lighting, throw rugs)  04/28/17   Time  4   Period Weeks   Status Achieved     PT SHORT TERM GOAL #2   Title The patient will have improved Timed up and Go test to 25 sec indicating greater ease with sit to stand   Time 4   Period Weeks   Status Achieved     PT SHORT TERM GOAL #3   Title BERG balance test improved from 29/56 to 33/56 indicating decreased risk of falls   Time 4   Period Weeks   Status Achieved           PT Long Term Goals - 05/25/17 1027      PT LONG TERM GOAL #1   Title The patient will be independent with safe self progession of HEP needed for further improvements in balance and strength  05/26/17   Time 8   Period Weeks   Status On-going     PT LONG TERM GOAL #2   Title The patient will have improved LE strength to grossly 4/5 needed for greater ease with sit to stand and up and down steps   Time 8   Period Weeks   Status Achieved     PT LONG TERM GOAL #3   Title The patient will have improved Activities-Specific Balance Scale score from 78% to 85%  confidence with ADLss   Time 8   Period Weeks   Status On-going     PT LONG TERM GOAL #4   Title BERG balance score improved to 50/56 indicating decreased fall risk    Time 8   Period Weeks   Status Revised     PT LONG TERM GOAL #5   Title Timed up and Go score improved to 13 sec indicating improved gait speed, mobility with sit to stand and decreased fall risk with a cane   Time 8   Period Weeks   Status Revised     Additional Long Term Goals   Additional Long Term Goals Yes     PT LONG TERM GOAL #6   Title stand with one foot ahead of the other for 30 seconds due to increased hip strength and balance so he is able to walk through a smaller doorway   Time 8   Period Weeks   Status New               Plan - 06/08/17 1445    Clinical Impression Statement Pt still struggles with single leg balance and tandem stance which he continues to practice at home. He performs a wide staggered stance very, pretty stable. He currently is walking 1/ 4 mile a day with his cane using the least amt of touches he can.     Rehab Potential Good   Clinical Impairments Affecting Rehab Potential high risk of falls   PT Frequency 2x / week   PT Duration 8 weeks   PT Treatment/Interventions ADLs/Self Care Home Management;Therapeutic activities;Therapeutic exercise;Neuromuscular re-education;Stair training;Functional mobility training;Patient/family education;Manual techniques;Gait training   PT Next Visit Plan KX modifie; tolerance for updated HEP   Consulted and Agree with Plan of Care Patient      Patient will benefit from skilled therapeutic intervention in order to improve the following deficits and impairments:  Decreased balance, Decreased activity tolerance, Decreased strength, Decreased mobility, Difficulty walking, Postural dysfunction  Visit Diagnosis: Other abnormalities of gait and mobility  Muscle weakness (generalized)  Unsteadiness on feet  Unspecified lack of  coordination     Problem List Patient Active Problem List  Diagnosis Date Noted  . Urinary hesitancy 02/10/2017  . Primary hypercoagulable state ([place]) [D68.59] 02/09/2017  . Encounter for therapeutic drug monitoring 02/09/2017  . CAD (coronary artery disease) 02/09/2017  . Anticoagulated 02/09/2017    Clancy Mullarkey, PTA 06/08/2017, 3:25 PM  [facility] [address] [place], [place], [address] Phone: [phone]   Fax:  [phone]  [name] MRN: [id number] Date of Birth: Jan 27, 1946

## 2017-06-11 MED ORDER — TRAMADOL HCL 50 MG PO TABS: 50.0000 mg | ORAL_TABLET | Freq: Four times a day (QID) | ORAL | 0 refills | Status: DC | PRN

## 2017-06-15 ENCOUNTER — Encounter: Payer: Self-pay | Admitting: Physical Therapy

## 2017-06-15 ENCOUNTER — Ambulatory Visit
Admission: RE | Admit: 2017-06-15 | Discharge: 2017-06-15 | Disposition: A | Discharge status: Home / Self Care | Payer: Medicare Other | Source: Ambulatory Visit | Attending: Neurology | Admitting: Neurology

## 2017-06-15 ENCOUNTER — Ambulatory Visit: Payer: Medicare Other | Admitting: Physical Therapy

## 2017-06-15 DIAGNOSIS — R279 Unspecified lack of coordination: Secondary | ICD-10-CM | POA: Diagnosis not present

## 2017-06-15 DIAGNOSIS — R2689 Other abnormalities of gait and mobility: Secondary | ICD-10-CM

## 2017-06-15 DIAGNOSIS — M546 Pain in thoracic spine: Secondary | ICD-10-CM

## 2017-06-15 DIAGNOSIS — M4802 Spinal stenosis, cervical region: Secondary | ICD-10-CM | POA: Diagnosis not present

## 2017-06-15 DIAGNOSIS — M542 Cervicalgia: Secondary | ICD-10-CM

## 2017-06-15 DIAGNOSIS — R0789 Other chest pain: Secondary | ICD-10-CM

## 2017-06-15 DIAGNOSIS — R2681 Unsteadiness on feet: Secondary | ICD-10-CM

## 2017-06-15 DIAGNOSIS — M6281 Muscle weakness (generalized): Secondary | ICD-10-CM

## 2017-06-15 DIAGNOSIS — G609 Hereditary and idiopathic neuropathy, unspecified: Secondary | ICD-10-CM

## 2017-06-15 DIAGNOSIS — E538 Deficiency of other specified B group vitamins: Secondary | ICD-10-CM

## 2017-06-15 NOTE — Therapy (Signed)
[facility] [address] [place], [place], [address] Phone: [phone]   Fax:  [phone]  Physical Therapy Treatment  Patient Details  [name] MRN: [id number] Date of Birth: 1946-07-19 Referring Provider: Dr. Betty Martinique  Encounter Date: 06/15/2017      PT End of Session - 06/15/17 1059    Visit Number 17   Number of Visits 20   Date for PT Re-Evaluation 07/21/17   Authorization Type  G code; KX modifiers at visit 15   PT Start Time 1055   PT Stop Time 1140   PT Time Calculation (min) 45 min   Activity Tolerance Patient tolerated treatment well   Behavior During Therapy [facility] for tasks assessed/performed      Past Medical History:  Diagnosis Date  . Allergy   . Arthritis   . 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 Cataracts, bilateral   . Chicken pox   . Dyslipidemia   . Hyperlipidemia   . Hypertension   . Sinus bradycardia     Past Surgical History:  Procedure Laterality Date  . BYPASS GRAFT  1996  . CATARACT EXTRACTION Right   . REFRACTIVE SURGERY Right   . SKIN BIOPSY    . UMBILICAL HERNIA REPAIR  2003    There were no vitals filed for this visit.      Subjective Assessment - 06/15/17 1100    Subjective Rough week in regards to "the waves of dizziness." He is scheduled for another MRI this afternoon on his cervical, tomorrow he is having an MRI on his lumbar  spine.    Pertinent History heart bypass surgery 20 years ago;  hasn't driven since 3785   How long can you walk comfortably? varies from all day with no cane   Patient Stated Goals regain balance; less of a hazard to my wife   Currently in Pain? Yes   Pain Score 9    Pain Location Neck   Pain Orientation Right;Left   Pain Descriptors / Indicators Sore;Sharp   Aggravating Factors  I lifted large limbs and that is what hurt my neck today.    Pain Relieving Factors ice & heat                          [facility] Adult PT Treatment/Exercise - 06/15/17 0001      High Level Balance   High Level Balance Activities Side stepping;Marching forwards     Neuro Re-ed    Neuro Re-ed Details  Stagger step; head turns 1 min: Narrow stance diagonal turns 30 sec each     Lumbar Exercises: Aerobic   Stationary Bike L4 x 10 min PTA present     Knee/Hip Exercises: Standing   Heel Raises Both;2 sets;10 reps   Heel Raises Limitations 4#   Hip Flexion Stengthening;Both;1 set;20 reps;Knee bent   Hip Flexion Limitations 4#   Hip Abduction Stengthening;Both;2 sets;10 reps   Abduction Limitations 4#   Hip Extension Stengthening;Right;Left;2 sets;10 reps;Knee straight   Extension Limitations 4#                  PT Short Term Goals - 05/25/17 1025      PT SHORT TERM GOAL #1   Title The patient will express understanding of best assistive device to decrease risk of falls as well as other safety considerations (lighting, throw rugs)  04/28/17   Time 4  Period Weeks   Status Achieved     PT SHORT TERM GOAL #2   Title The patient will have improved Timed up and Go test to 25 sec indicating greater ease with sit to stand   Time 4   Period Weeks   Status Achieved     PT SHORT TERM GOAL #3   Title BERG balance test improved from 29/56 to 33/56 indicating decreased risk of falls   Time 4   Period Weeks   Status Achieved           PT Long Term Goals - 05/25/17 1027      PT LONG TERM GOAL #1   Title The patient will be independent with safe self progession of HEP needed for further improvements in balance and strength  05/26/17   Time 8   Period Weeks   Status On-going     PT LONG TERM GOAL #2   Title The patient will have improved LE strength to grossly 4/5 needed for greater ease with sit to stand and up and down steps   Time 8   Period Weeks   Status Achieved     PT LONG TERM GOAL #3   Title The patient will have improved  Activities-Specific Balance Scale score from 78% to 85% confidence with ADLss   Time 8   Period Weeks   Status On-going     PT LONG TERM GOAL #4   Title BERG balance score improved to 50/56 indicating decreased fall risk    Time 8   Period Weeks   Status Revised     PT LONG TERM GOAL #5   Title Timed up and Go score improved to 13 sec indicating improved gait speed, mobility with sit to stand and decreased fall risk with a cane   Time 8   Period Weeks   Status Revised     Additional Long Term Goals   Additional Long Term Goals Yes     PT LONG TERM GOAL #6   Title stand with one foot ahead of the other for 30 seconds due to increased hip strength and balance so he is able to walk through a smaller doorway   Time 8   Period Weeks   Status New               Plan - 06/15/17 1059    Clinical Impression Statement Pt was able to pick up large limbs in his yard due to improved balance, although his cervical muscles have been pretty painful.  Today he struggled with a feeling of  "grogginess/ wore out/dizzy" in standing positions.  He continues to walk 1/4 mile 1-2x day.    Rehab Potential Good   Clinical Impairments Affecting Rehab Potential high risk of falls   PT Frequency 2x / week   PT Duration 8 weeks   PT Treatment/Interventions ADLs/Self Care Home Management;Therapeutic activities;Therapeutic exercise;Neuromuscular re-education;Stair training;Functional mobility training;Patient/family education;Manual techniques;Gait training   PT Next Visit Plan KX modifie; follow up with MRI results, balance and LE strength   Consulted and Agree with Plan of Care --      Patient will benefit from skilled therapeutic intervention in order to improve the following deficits and impairments:  Decreased balance, Decreased activity tolerance, Decreased strength, Decreased mobility, Difficulty walking, Postural dysfunction  Visit Diagnosis: Other abnormalities of gait and  mobility  Muscle weakness (generalized)  Unsteadiness on feet  Unspecified lack of coordination     Problem List Patient Active  Problem List   Diagnosis Date Noted  . Urinary hesitancy 02/10/2017  . Primary hypercoagulable state ([place]) [D68.59] 02/09/2017  . Encounter for therapeutic drug monitoring 02/09/2017  . CAD (coronary artery disease) 02/09/2017  . Anticoagulated 02/09/2017    Jadien Lehigh, PTA 06/15/2017, 11:44 AM  [facility] [address] [place], [place], [address] Phone: [phone]   Fax:  [phone]  Name: ATANACIO MELNYK MRN: [id number] Date of Birth: Jun 24, 1946

## 2017-06-16 ENCOUNTER — Ambulatory Visit
Admission: RE | Admit: 2017-06-16 | Discharge: 2017-06-16 | Disposition: A | Discharge status: Home / Self Care | Payer: Medicare Other | Source: Ambulatory Visit | Attending: Neurology | Admitting: Neurology

## 2017-06-16 DIAGNOSIS — E538 Deficiency of other specified B group vitamins: Secondary | ICD-10-CM

## 2017-06-16 DIAGNOSIS — M542 Cervicalgia: Secondary | ICD-10-CM

## 2017-06-16 DIAGNOSIS — M5124 Other intervertebral disc displacement, thoracic region: Secondary | ICD-10-CM | POA: Diagnosis not present

## 2017-06-16 DIAGNOSIS — G609 Hereditary and idiopathic neuropathy, unspecified: Secondary | ICD-10-CM

## 2017-06-16 DIAGNOSIS — R2681 Unsteadiness on feet: Secondary | ICD-10-CM

## 2017-06-16 DIAGNOSIS — R0789 Other chest pain: Secondary | ICD-10-CM

## 2017-06-16 DIAGNOSIS — M546 Pain in thoracic spine: Secondary | ICD-10-CM

## 2017-06-17 ENCOUNTER — Ambulatory Visit: Payer: Medicare Other

## 2017-06-17 ENCOUNTER — Telehealth: Payer: Self-pay | Admitting: *Deleted

## 2017-06-17 ENCOUNTER — Other Ambulatory Visit: Payer: Self-pay | Admitting: *Deleted

## 2017-06-17 DIAGNOSIS — M5134 Other intervertebral disc degeneration, thoracic region: Secondary | ICD-10-CM | POA: Diagnosis not present

## 2017-06-17 DIAGNOSIS — M9902 Segmental and somatic dysfunction of thoracic region: Secondary | ICD-10-CM | POA: Diagnosis not present

## 2017-06-17 DIAGNOSIS — R279 Unspecified lack of coordination: Secondary | ICD-10-CM

## 2017-06-17 DIAGNOSIS — M6281 Muscle weakness (generalized): Secondary | ICD-10-CM

## 2017-06-17 DIAGNOSIS — M9903 Segmental and somatic dysfunction of lumbar region: Secondary | ICD-10-CM | POA: Diagnosis not present

## 2017-06-17 DIAGNOSIS — R2681 Unsteadiness on feet: Secondary | ICD-10-CM

## 2017-06-17 DIAGNOSIS — M5431 Sciatica, right side: Secondary | ICD-10-CM | POA: Diagnosis not present

## 2017-06-17 DIAGNOSIS — M9905 Segmental and somatic dysfunction of pelvic region: Secondary | ICD-10-CM | POA: Diagnosis not present

## 2017-06-17 DIAGNOSIS — M5136 Other intervertebral disc degeneration, lumbar region: Secondary | ICD-10-CM | POA: Diagnosis not present

## 2017-06-17 DIAGNOSIS — R2689 Other abnormalities of gait and mobility: Secondary | ICD-10-CM | POA: Diagnosis not present

## 2017-06-17 MED ORDER — GABAPENTIN 300 MG PO CAPS: 300.0000 mg | ORAL_CAPSULE | Freq: Every day | ORAL | 3 refills | Status: DC

## 2017-06-17 NOTE — Telephone Encounter (Signed)
Called patient and left message giving him the results and informed him that I will send Rx in for gabapentin.

## 2017-06-17 NOTE — Telephone Encounter (Signed)
-----   Message from Pieter Partridge, DO sent at 06/17/2017 11:46 AM EDT ----- MRI of cervical and thoracic spine show a lot of degenerative changes with some pinched nervesbut nothing structural to explain the shooting pain up and down the back/neck.  In the meantime, we can try a medication to treat the symptoms, such as gabapentin 300mg  at bedtime.

## 2017-06-17 NOTE — Therapy (Signed)
[facility] [address] [place], [place], [address] Phone: [phone]   Fax:  [phone]  Physical Therapy Treatment  Patient Details  [name] MRN: [id number] Date of Birth: [birth year] Referring Provider: Dr. Betty Martinique  Encounter Date: 06/17/2017      PT End of Session - 06/17/17 1140    Visit Number 18   Number of Visits 20   Date for PT Re-Evaluation 07/21/17   Authorization Type  G code; KX MODIFIER NEEDED   PT Start Time 1058   PT Stop Time 1142   PT Time Calculation (min) 44 min   Activity Tolerance Patient tolerated treatment well   Behavior During Therapy [facility] for tasks assessed/performed      Past Medical History:  Diagnosis Date  . Allergy   . Arthritis   . 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 Cataracts, bilateral   . Chicken pox   . Dyslipidemia   . Hyperlipidemia   . Hypertension   . Sinus bradycardia     Past Surgical History:  Procedure Laterality Date  . BYPASS GRAFT  1996  . CATARACT EXTRACTION Right   . REFRACTIVE SURGERY Right   . SKIN BIOPSY    . UMBILICAL HERNIA REPAIR  2003    There were no vitals filed for this visit.      Subjective Assessment - 06/17/17 1108    Subjective Had MRI of neck and thoracic spine earlier this week.  I am feeling stiff today.     Currently in Pain? Yes   Pain Score 5    Pain Location Neck   Pain Orientation Right;Left   Pain Descriptors / Indicators Sore;Sharp   Pain Type Chronic pain            [facility] PT Assessment - 06/17/17 0001      Timed Up and Go Test   TUG Normal TUG   Normal TUG (seconds) 19  used cane                     [facility] Adult PT Treatment/Exercise - 06/17/17 0001      High Level Balance   High Level Balance Comments stagger step with 30 second hold: 2 times each leg, static stance on airex 3x30 seconds     Lumbar Exercises: Aerobic   Stationary Bike L4 x 10 min  PT present     Knee/Hip Exercises: Standing   Heel Raises Both;2 sets;10 reps   Hip Flexion Stengthening;Both;1 set;20 reps;Knee bent   Hip Flexion Limitations 4#   Hip Abduction Stengthening;Both;2 sets;10 reps   Abduction Limitations 4#   Hip Extension Stengthening;Right;Left;2 sets;10 reps;Knee straight   Extension Limitations 4#   Rocker Board 3 minutes   Rebounder weight shifting 3 ways x 1 minute each                  PT Short Term Goals - 05/25/17 1025      PT SHORT TERM GOAL #1   Title The patient will express understanding of best assistive device to decrease risk of falls as well as other safety considerations (lighting, throw rugs)  04/28/17   Time 4   Period Weeks   Status Achieved     PT SHORT TERM GOAL #2   Title The patient will have improved Timed up and Go test to 25 sec indicating greater ease with sit to stand   Time 4  Period Weeks   Status Achieved     PT SHORT TERM GOAL #3   Title BERG balance test improved from 29/56 to 33/56 indicating decreased risk of falls   Time 4   Period Weeks   Status Achieved           PT Long Term Goals - 06/17/17 1109      PT LONG TERM GOAL #1   Title The patient will be independent with safe self progession of HEP needed for further improvements in balance and strength  05/26/17   Time 8   Period Weeks   Status On-going     PT LONG TERM GOAL #2   Title The patient will have improved LE strength to grossly 4/5 needed for greater ease with sit to stand and up and down steps   Status Achieved     PT LONG TERM GOAL #5   Title Timed up and Go score improved to 13 sec indicating improved gait speed, mobility with sit to stand and decreased fall risk with a cane   Baseline 19 seconds   Time 8   Period Weeks   Status On-going     PT LONG TERM GOAL #6   Title stand with one foot ahead of the other for 30 seconds due to increased hip strength and balance so he is able to walk through a smaller doorway    Time 8   Period Weeks   Status On-going               Plan - 06/17/17 1121    Clinical Impression Statement Pt continues to ambulate with wide base of support with reduced trunk rotation.  TUG today is 19 seconds.  Pt reports improved balance with ability to do more at home including pick up limbs in the yard with safely.  Pt didn't have deficits of dizziness with exercise today.  Pt  will continue to benefit from skilled PT for strength, endurance, balance and gait to improve safety with home and community tasks   Rehab Potential Good   PT Frequency 2x / week   PT Duration 8 weeks   PT Treatment/Interventions ADLs/Self Care Home Management;Therapeutic activities;Therapeutic exercise;Neuromuscular re-education;Stair training;Functional mobility training;Patient/family education;Manual techniques;Gait training   PT Next Visit Plan KX modifie; follow up with MRI results, balance and LE strength   Consulted and Agree with Plan of Care Patient      Patient will benefit from skilled therapeutic intervention in order to improve the following deficits and impairments:  Decreased balance, Decreased activity tolerance, Decreased strength, Decreased mobility, Difficulty walking, Postural dysfunction  Visit Diagnosis: Other abnormalities of gait and mobility  Muscle weakness (generalized)  Unsteadiness on feet  Unspecified lack of coordination     Problem List Patient Active Problem List   Diagnosis Date Noted  . Urinary hesitancy 02/10/2017  . Primary hypercoagulable state ([place]) [D68.59] 02/09/2017  . Encounter for therapeutic drug monitoring 02/09/2017  . CAD (coronary artery disease) 02/09/2017  . Anticoagulated 02/09/2017    [name], PT 06/17/17 11:43 AM  [facility] [address], [place], [place], [address] Phone: [phone]   Fax:  [phone]  [name] MRN: [id number] Date of  Birth: 1946-07-03

## 2017-06-19 ENCOUNTER — Telehealth: Payer: Self-pay | Admitting: *Deleted

## 2017-06-19 ENCOUNTER — Ambulatory Visit (INDEPENDENT_AMBULATORY_CARE_PROVIDER_SITE_OTHER): Payer: Medicare Other | Admitting: *Deleted

## 2017-06-19 DIAGNOSIS — I2510 Atherosclerotic heart disease of native coronary artery without angina pectoris: Secondary | ICD-10-CM

## 2017-06-19 DIAGNOSIS — Z5181 Encounter for therapeutic drug level monitoring: Secondary | ICD-10-CM

## 2017-06-19 DIAGNOSIS — D6859 Other primary thrombophilia: Secondary | ICD-10-CM

## 2017-06-19 LAB — POCT INR: INR: 2.4

## 2017-06-19 NOTE — Telephone Encounter (Signed)
Received voicemail earlier today and tried to call patient back.  The call would not go through.  I tried again and had to leave a message for patient's wife to call me back.

## 2017-06-19 NOTE — Telephone Encounter (Signed)
Patient's wife called back and I gave her some information about the gabapentin.

## 2017-06-22 ENCOUNTER — Ambulatory Visit: Payer: Medicare Other

## 2017-06-22 DIAGNOSIS — R2689 Other abnormalities of gait and mobility: Secondary | ICD-10-CM | POA: Diagnosis not present

## 2017-06-22 DIAGNOSIS — M6281 Muscle weakness (generalized): Secondary | ICD-10-CM

## 2017-06-22 DIAGNOSIS — R279 Unspecified lack of coordination: Secondary | ICD-10-CM | POA: Diagnosis not present

## 2017-06-22 DIAGNOSIS — R2681 Unsteadiness on feet: Secondary | ICD-10-CM | POA: Diagnosis not present

## 2017-06-22 NOTE — Therapy (Signed)
[facility] [address] [place], [place], [address] Phone: [phone]   Fax:  [phone]  Physical Therapy Treatment  Patient Details  [name] MRN: [id number] Date of Birth: [birth year] Referring Provider: Dr. Betty Martinique  Encounter Date: 06/22/2017      PT End of Session - 06/22/17 1111    Visit Number 19   Number of Visits 20   Date for PT Re-Evaluation 07/21/17   Authorization Type  G code; KX MODIFIER NEEDED   PT Start Time 1013   PT Stop Time 1100   PT Time Calculation (min) 47 min   Activity Tolerance Patient tolerated treatment well   Behavior During Therapy [facility] for tasks assessed/performed      Past Medical History:  Diagnosis Date  . Allergy   . Arthritis   . 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 Cataracts, bilateral   . Chicken pox   . Dyslipidemia   . Hyperlipidemia   . Hypertension   . Sinus bradycardia     Past Surgical History:  Procedure Laterality Date  . BYPASS GRAFT  1996  . CATARACT EXTRACTION Right   . REFRACTIVE SURGERY Right   . SKIN BIOPSY    . UMBILICAL HERNIA REPAIR  2003    There were no vitals filed for this visit.      Subjective Assessment - 06/22/17 1023    Subjective I got my results from MRI of neck-OA and bone spurs, bulging discs   Diagnostic tests MRI head in 2 weeks;  MRI thoracic to lumbar in past shows scoliosis and arthritis;  very low Vit D then too much; hi calcium   Currently in Pain? Yes   Pain Score 5    Pain Location Neck   Pain Orientation Right;Left   Pain Descriptors / Indicators Sore   Pain Type Chronic pain   Pain Onset More than a month ago   Pain Frequency Intermittent   Aggravating Factors  morning   Pain Relieving Factors ice and heat            [facility] PT Assessment - 06/22/17 0001      Balance   Balance Assessed Yes                     [facility] Adult PT Treatment/Exercise -  06/22/17 0001      High Level Balance   High Level Balance Comments stagger step with 30 second hold: 2 times each leg, static stance on airex 3x30 seconds     Lumbar Exercises: Aerobic   Stationary Bike L4 x 10 min PT present     Knee/Hip Exercises: Standing   Heel Raises Both;2 sets;10 reps   Hip Flexion Stengthening;Both;1 set;20 reps;Knee bent   Hip Flexion Limitations 4#   Hip Abduction Stengthening;Both;2 sets;10 reps   Abduction Limitations 4#   Hip Extension Stengthening;Right;Left;2 sets;10 reps;Knee straight   Extension Limitations 4#   Rocker Board 3 minutes   Rebounder weight shifting 3 ways x 1 minute each                  PT Short Term Goals - 05/25/17 1025      PT SHORT TERM GOAL #1   Title The patient will express understanding of best assistive device to decrease risk of falls as well as other safety considerations (lighting, throw rugs)  04/28/17   Time 4   Period Weeks  Status Achieved     PT SHORT TERM GOAL #2   Title The patient will have improved Timed up and Go test to 25 sec indicating greater ease with sit to stand   Time 4   Period Weeks   Status Achieved     PT SHORT TERM GOAL #3   Title BERG balance test improved from 29/56 to 33/56 indicating decreased risk of falls   Time 4   Period Weeks   Status Achieved           PT Long Term Goals - 06/22/17 1029      PT LONG TERM GOAL #1   Title The patient will be independent with safe self progession of HEP needed for further improvements in balance and strength  05/26/17   Time 8   Period Weeks   Status On-going     PT LONG TERM GOAL #5   Title Timed up and Go score improved to 13 sec indicating improved gait speed, mobility with sit to stand and decreased fall risk with a cane   Baseline 19 seconds   Time 8   Period Weeks   Status On-going     PT LONG TERM GOAL #6   Title stand with one foot ahead of the other for 30 seconds due to increased hip strength and balance so he is  able to walk through a smaller doorway   Baseline staggered step   Time 8   Period Weeks   Status On-going               Plan - 06/22/17 1034    Clinical Impression Statement Pt continues to ambulate with wide base of support and reduced trunk rotation.  TUG is 19 seconds.  Pt able to do more at home due to improved balance confidence including picking up limbs in the yard and moving heavy boxes.  Pt with chronic neck and thoracic pain that limits mobility at times.  Pt will continue to benefit from skilled PT for strength, endurance, balance and gait to improve safety with home and community tasks.     Rehab Potential Good   Clinical Impairments Affecting Rehab Potential high risk of falls   PT Frequency 2x / week   PT Duration 8 weeks   PT Treatment/Interventions ADLs/Self Care Home Management;Therapeutic activities;Therapeutic exercise;Neuromuscular re-education;Stair training;Functional mobility training;Patient/family education;Manual techniques;Gait training   PT Next Visit Plan KX modifier; G-codes next, balance and LE strength   Consulted and Agree with Plan of Care Patient      Patient will benefit from skilled therapeutic intervention in order to improve the following deficits and impairments:  Decreased balance, Decreased activity tolerance, Decreased strength, Decreased mobility, Difficulty walking, Postural dysfunction  Visit Diagnosis: Other abnormalities of gait and mobility  Muscle weakness (generalized)  Unsteadiness on feet  Unspecified lack of coordination     Problem List Patient Active Problem List   Diagnosis Date Noted  . Urinary hesitancy 02/10/2017  . Primary hypercoagulable state ([place]) [D68.59] 02/09/2017  . Encounter for therapeutic drug monitoring 02/09/2017  . CAD (coronary artery disease) 02/09/2017  . Anticoagulated 02/09/2017     [name], PT 06/22/17 11:14 AM  [facility] [address], [place], [place], [address] Phone: [phone]   Fax:  [phone]  Name: DEMITRIOS MOLYNEUX MRN: [id number] Date of Birth: 08/02/1946

## 2017-06-24 ENCOUNTER — Ambulatory Visit: Payer: Medicare Other

## 2017-06-24 DIAGNOSIS — R279 Unspecified lack of coordination: Secondary | ICD-10-CM | POA: Diagnosis not present

## 2017-06-24 DIAGNOSIS — R2681 Unsteadiness on feet: Secondary | ICD-10-CM | POA: Diagnosis not present

## 2017-06-24 DIAGNOSIS — M6281 Muscle weakness (generalized): Secondary | ICD-10-CM

## 2017-06-24 DIAGNOSIS — R2689 Other abnormalities of gait and mobility: Secondary | ICD-10-CM

## 2017-06-24 NOTE — Therapy (Addendum)
[facility] [address] [place], [place], [address] Phone: [phone]   Fax:  [phone]  Physical Therapy Treatment  Patient Details  [name] MRN: [id number] Date of Birth: 1946-04-06 Referring Provider: Dr. Betty Martinique  Encounter Date: 06/24/2017      PT End of Session - 06/24/17 1051    Visit Number 20   Number of Visits 30   Date for PT Re-Evaluation 07/21/17   Authorization Type  G code; KX MODIFIER NEEDED   PT Start Time 1015   PT Stop Time 1100   PT Time Calculation (min) 45 min   Activity Tolerance Patient tolerated treatment well   Behavior During Therapy [facility] for tasks assessed/performed      Past Medical History:  Diagnosis Date  . Allergy   . Arthritis   . 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 Cataracts, bilateral   . Chicken pox   . Dyslipidemia   . Hyperlipidemia   . Hypertension   . Sinus bradycardia     Past Surgical History:  Procedure Laterality Date  . BYPASS GRAFT  1996  . CATARACT EXTRACTION Right   . REFRACTIVE SURGERY Right   . SKIN BIOPSY    . UMBILICAL HERNIA REPAIR  2003    There were no vitals filed for this visit.      Subjective Assessment - 06/24/17 1105    Subjective I am doing well.     Currently in Pain? No/denies            [facility] PT Assessment - 06/24/17 0001      Observation/Other Assessments   Activities of Balance Confidence Scale (ABC Scale)  73.75     Timed Up and Go Test   TUG Normal TUG   Normal TUG (seconds) 16.92  with cane                     [facility] Adult PT Treatment/Exercise - 06/24/17 0001      High Level Balance   High Level Balance Comments stagger step with 30 second hold: 2 times each leg, static stance on airex 3x30 seconds     Lumbar Exercises: Aerobic   Stationary Bike NuStep: L4 x 10 min PT present     Knee/Hip Exercises: Standing   Heel Raises Both;2 sets;10 reps   Hip  Flexion Stengthening;Both;1 set;20 reps;Knee bent   Hip Flexion Limitations 4#   Hip Abduction Stengthening;Both;2 sets;10 reps   Abduction Limitations 4#   Hip Extension Stengthening;Right;Left;2 sets;10 reps;Knee straight   Extension Limitations 4#   Rocker Board 3 minutes   Rebounder weight shifting 3 ways x 1 minute each                  PT Short Term Goals - 05/25/17 1025      PT SHORT TERM GOAL #1   Title The patient will express understanding of best assistive device to decrease risk of falls as well as other safety considerations (lighting, throw rugs)  04/28/17   Time 4   Period Weeks   Status Achieved     PT SHORT TERM GOAL #2   Title The patient will have improved Timed up and Go test to 25 sec indicating greater ease with sit to stand   Time 4   Period Weeks   Status Achieved     PT SHORT TERM GOAL #3   Title BERG balance test improved  from 29/56 to 33/56 indicating decreased risk of falls   Time 4   Period Weeks   Status Achieved           PT Long Term Goals - 07-10-17 1030      PT LONG TERM GOAL #3   Title The patient will have improved Activities-Specific Balance Scale score from 78% to 85% confidence with ADLss   Baseline 73.73%   Time 8   Period Weeks   Status On-going     PT LONG TERM GOAL #5   Title Timed up and Go score improved to 13 sec indicating improved gait speed, mobility with sit to stand and decreased fall risk with a cane   Baseline 16.92   Time 8   Period Weeks   Status On-going               Plan - 07-10-17 1040    Clinical Impression Statement Pt continues to ambulate with wide base of support and decreased trunk rotation.  TUG is 16.92 seconds and ABC scale is 73.75% indicating moderate level of physical function.  Pt with balance and endurance deficits and will continue to benefit from skilled PT for balance and strength training.     Rehab Potential Good   Clinical Impairments Affecting Rehab Potential high  risk of falls   PT Frequency 2x / week   PT Duration 8 weeks   PT Treatment/Interventions ADLs/Self Care Home Management;Therapeutic activities;Therapeutic exercise;Neuromuscular re-education;Stair training;Functional mobility training;Patient/family education;Manual techniques;Gait training   PT Next Visit Plan KX modifier;, balance and LE strength   Recommended Other Services recert is signed   Consulted and Agree with Plan of Care Patient      Patient will benefit from skilled therapeutic intervention in order to improve the following deficits and impairments:  Decreased balance, Decreased activity tolerance, Decreased strength, Decreased mobility, Difficulty walking, Postural dysfunction  Visit Diagnosis: Other abnormalities of gait and mobility  Muscle weakness (generalized)  Unsteadiness on feet  Unspecified lack of coordination       G-Codes - 2017/07/10 1031    Functional Assessment Tool Used (Outpatient Only) ABC scale, clinical judgement, TUG   Functional Limitation Mobility: Walking and moving around   Mobility: Walking and Moving Around Current Status (C1448) At least 40 percent but less than 60 percent impaired, limited or restricted   Mobility: Walking and Moving Around Goal Status [phone]) At least 40 percent but less than 60 percent impaired, limited or restricted      Problem List Patient Active Problem List   Diagnosis Date Noted  . Urinary hesitancy 02/10/2017  . Primary hypercoagulable state ([place]) [D68.59] 02/09/2017  . Encounter for therapeutic drug monitoring 02/09/2017  . CAD (coronary artery disease) 02/09/2017  . Anticoagulated 02/09/2017   [name], PT 2017/07/10 11:07 AM  [facility] [address], [place] , [place], [address] Phone: [phone]   Fax:  [phone]  [name] MRN: [id number] Date of Birth: [birth year]

## 2017-06-29 ENCOUNTER — Ambulatory Visit: Payer: Medicare Other

## 2017-06-29 DIAGNOSIS — R2689 Other abnormalities of gait and mobility: Secondary | ICD-10-CM | POA: Diagnosis not present

## 2017-06-29 DIAGNOSIS — R2681 Unsteadiness on feet: Secondary | ICD-10-CM

## 2017-06-29 DIAGNOSIS — R279 Unspecified lack of coordination: Secondary | ICD-10-CM | POA: Diagnosis not present

## 2017-06-29 DIAGNOSIS — M6281 Muscle weakness (generalized): Secondary | ICD-10-CM

## 2017-06-29 NOTE — Therapy (Signed)
[facility] [address] [place], [place], [address] Phone: [phone]   Fax:  [phone]  Physical Therapy Treatment  Patient Details  [name] MRN: [id number] Date of Birth: 12-11-1945 Referring Provider: Dr. Betty Martinique  Encounter Date: 06/29/2017      PT End of Session - 06/29/17 1057    Visit Number 21   Number of Visits 30   Date for PT Re-Evaluation 07/21/17   Authorization Type  G code; KX MODIFIER NEEDED   PT Start Time 1015   PT Stop Time 1057   PT Time Calculation (min) 42 min   Activity Tolerance Patient tolerated treatment well   Behavior During Therapy [facility] for tasks assessed/performed      Past Medical History:  Diagnosis Date  . Allergy   . Arthritis   . 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 Cataracts, bilateral   . Chicken pox   . Dyslipidemia   . Hyperlipidemia   . Hypertension   . Sinus bradycardia     Past Surgical History:  Procedure Laterality Date  . BYPASS GRAFT  1996  . CATARACT EXTRACTION Right   . REFRACTIVE SURGERY Right   . SKIN BIOPSY    . UMBILICAL HERNIA REPAIR  2003    There were no vitals filed for this visit.      Subjective Assessment - 06/29/17 1025    Subjective Doing well with my exercises   Pertinent History heart bypass surgery 20 years ago;  hasn't driven since 4665   Currently in Pain? No/denies                         [facility] Adult PT Treatment/Exercise - 06/29/17 0001      High Level Balance   High Level Balance Comments stagger step with 30 second hold: 2 times each leg, static stance on airex 3x30 seconds     Lumbar Exercises: Aerobic   Stationary Bike NuStep: L4 x 10 min PT present     Knee/Hip Exercises: Standing   Heel Raises Both;2 sets;10 reps   Hip Flexion Stengthening;Both;1 set;20 reps;Knee bent   Hip Flexion Limitations 4#   Hip Abduction Stengthening;Both;2 sets;10 reps   Abduction  Limitations 4#   Hip Extension Stengthening;Right;Left;2 sets;10 reps;Knee straight   Extension Limitations 4#   Rocker Board 3 minutes   Rebounder weight shifting 3 ways x 1 minute each                  PT Short Term Goals - 05/25/17 1025      PT SHORT TERM GOAL #1   Title The patient will express understanding of best assistive device to decrease risk of falls as well as other safety considerations (lighting, throw rugs)  04/28/17   Time 4   Period Weeks   Status Achieved     PT SHORT TERM GOAL #2   Title The patient will have improved Timed up and Go test to 25 sec indicating greater ease with sit to stand   Time 4   Period Weeks   Status Achieved     PT SHORT TERM GOAL #3   Title BERG balance test improved from 29/56 to 33/56 indicating decreased risk of falls   Time 4   Period Weeks   Status Achieved           PT Long Term Goals - 06/29/17 1028  PT LONG TERM GOAL #1   Title The patient will be independent with safe self progession of HEP needed for further improvements in balance and strength  05/26/17   Time 8   Period Weeks   Status On-going     PT LONG TERM GOAL #2   Title The patient will have improved LE strength to grossly 4/5 needed for greater ease with sit to stand and up and down steps   Time 8   Status Achieved     PT LONG TERM GOAL #3   Title The patient will have improved Activities-Specific Balance Scale score from 78% to 85% confidence with ADLss   Baseline 73.73%   Period Weeks   Status On-going     PT LONG TERM GOAL #5   Baseline 16.92   Time 8   Period Weeks   Status On-going               Plan - 06/29/17 1030    Clinical Impression Statement Pt continues to ambulate with wide base of support and decreased trunk rotation.  TUG was 16.92 seconds and ABC scale was 73.75% last week indicating moderate level of physical function.  Pt with balance and endurance deficits and will continue to benefit from skilled PT for  balance and strength training.     Rehab Potential Good   PT Frequency 2x / week   PT Duration 8 weeks   PT Treatment/Interventions ADLs/Self Care Home Management;Therapeutic activities;Therapeutic exercise;Neuromuscular re-education;Stair training;Functional mobility training;Patient/family education;Manual techniques;Gait training   PT Next Visit Plan KX modifier;, balance and LE strength   Consulted and Agree with Plan of Care Patient      Patient will benefit from skilled therapeutic intervention in order to improve the following deficits and impairments:  Decreased balance, Decreased activity tolerance, Decreased strength, Decreased mobility, Difficulty walking, Postural dysfunction  Visit Diagnosis: Other abnormalities of gait and mobility  Muscle weakness (generalized)  Unspecified lack of coordination  Unsteadiness on feet     Problem List Patient Active Problem List   Diagnosis Date Noted  . Urinary hesitancy 02/10/2017  . Primary hypercoagulable state ([place]) [D68.59] 02/09/2017  . Encounter for therapeutic drug monitoring 02/09/2017  . CAD (coronary artery disease) 02/09/2017  . Anticoagulated 02/09/2017    [name], PT 06/29/17 11:04 AM  [facility] [address], [place], [place], [address] Phone: [phone]   Fax:  [phone]  [name] MRN: [id number] Date of Birth: 10/09/1946

## 2017-07-01 ENCOUNTER — Ambulatory Visit: Payer: Medicare Other

## 2017-07-01 DIAGNOSIS — R2681 Unsteadiness on feet: Secondary | ICD-10-CM | POA: Diagnosis not present

## 2017-07-01 DIAGNOSIS — R279 Unspecified lack of coordination: Secondary | ICD-10-CM | POA: Diagnosis not present

## 2017-07-01 DIAGNOSIS — M5134 Other intervertebral disc degeneration, thoracic region: Secondary | ICD-10-CM | POA: Diagnosis not present

## 2017-07-01 DIAGNOSIS — M5136 Other intervertebral disc degeneration, lumbar region: Secondary | ICD-10-CM | POA: Diagnosis not present

## 2017-07-01 DIAGNOSIS — R2689 Other abnormalities of gait and mobility: Secondary | ICD-10-CM

## 2017-07-01 DIAGNOSIS — M6281 Muscle weakness (generalized): Secondary | ICD-10-CM | POA: Diagnosis not present

## 2017-07-01 DIAGNOSIS — M9905 Segmental and somatic dysfunction of pelvic region: Secondary | ICD-10-CM | POA: Diagnosis not present

## 2017-07-01 DIAGNOSIS — M5431 Sciatica, right side: Secondary | ICD-10-CM | POA: Diagnosis not present

## 2017-07-01 DIAGNOSIS — M9903 Segmental and somatic dysfunction of lumbar region: Secondary | ICD-10-CM | POA: Diagnosis not present

## 2017-07-01 DIAGNOSIS — M9902 Segmental and somatic dysfunction of thoracic region: Secondary | ICD-10-CM | POA: Diagnosis not present

## 2017-07-01 NOTE — Therapy (Signed)
[facility] [address], [place], [place], [address] Phone: [phone]   Fax:  [phone]  Physical Therapy Treatment  Patient Details  [name] MRN: [id number] Date of Birth: 05/07/1946 Referring Provider: Dr. Betty Martinique  Encounter Date: 07/01/2017      PT End of Session - 07/01/17 1029    Visit Number 22   Number of Visits 30   Date for PT Re-Evaluation 07/21/17   Authorization Type  G code; KX MODIFIER NEEDED   PT Start Time 1017   PT Stop Time 1100   PT Time Calculation (min) 43 min   Activity Tolerance Patient tolerated treatment well   Behavior During Therapy [facility] for tasks assessed/performed      Past Medical History:  Diagnosis Date  . Allergy   . Arthritis   . 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 Cataracts, bilateral   . Chicken pox   . Dyslipidemia   . Hyperlipidemia   . Hypertension   . Sinus bradycardia     Past Surgical History:  Procedure Laterality Date  . BYPASS GRAFT  1996  . CATARACT EXTRACTION Right   . REFRACTIVE SURGERY Right   . SKIN BIOPSY    . UMBILICAL HERNIA REPAIR  2003    There were no vitals filed for this visit.      Subjective Assessment - 07/01/17 1023    Subjective [name] doing well today no issues.     Patient Stated Goals regain balance; less of a hazard to my wife   Currently in Pain? Yes   Pain Score 2    Pain Location Neck   Pain Orientation Right;Left   Pain Descriptors / Indicators Sore   Pain Type Chronic pain   Multiple Pain Sites No                         [facility] Adult PT Treatment/Exercise - 07/01/17 1029      Neuro Re-ed    Neuro Re-ed Details  forward and side step over bolster 4 bolsters x 3 laps down/back; supervision provided     Lumbar Exercises: Aerobic   Tread Mill 6 min, 1.5 mph; with p-ball kick to encourage longer step length     Knee/Hip Exercises: Standing   Hip Flexion  Right;Left;10 reps;Knee straight   Hip Flexion Limitations with red TB closed in door; 2 ski poles   Hip ADduction Right;Left;10 reps   Hip ADduction Limitations with red TB closed in door: 2 ski poles   Hip Abduction Right;Left;10 reps;Knee straight   Abduction Limitations with red TB closed in door; 2 ski poles   Hip Extension Right;Left;Knee straight;10 reps   Extension Limitations with red TB closed in door; 2 ski poles    Other Standing Knee Exercises forward, lateral toe-clears with 4# at ankles to 10" step x 20 each way; 1 ski pole support      Shoulder Exercises: Standing   Horizontal ABduction 15 reps;Theraband   Theraband Level (Shoulder Horizontal ABduction) Level 2 (Red)   Horizontal ABduction Limitations leaning on 6" bolster on wall    External Rotation 15 reps;Theraband  leaning on 6" bolster on wall    Theraband Level (Shoulder External Rotation) Level 2 (Red)                PT Education - 07/01/17 1348    Education provided Yes   Education Details 4-way  SLR with red TB closed in door   Person(s) Educated Patient   Methods Explanation;Demonstration;Verbal cues;Handout   Comprehension Verbalized understanding;Returned demonstration;Verbal cues required;Need further instruction          PT Short Term Goals - 05/25/17 1025      PT SHORT TERM GOAL #1   Title The patient will express understanding of best assistive device to decrease risk of falls as well as other safety considerations (lighting, throw rugs)  04/28/17   Time 4   Period Weeks   Status Achieved     PT SHORT TERM GOAL #2   Title The patient will have improved Timed up and Go test to 25 sec indicating greater ease with sit to stand   Time 4   Period Weeks   Status Achieved     PT SHORT TERM GOAL #3   Title BERG balance test improved from 29/56 to 33/56 indicating decreased risk of falls   Time 4   Period Weeks   Status Achieved           PT Long Term Goals - 06/29/17 1028       PT LONG TERM GOAL #1   Title The patient will be independent with safe self progession of HEP needed for further improvements in balance and strength  05/26/17   Time 8   Period Weeks   Status On-going     PT LONG TERM GOAL #2   Title The patient will have improved LE strength to grossly 4/5 needed for greater ease with sit to stand and up and down steps   Time 8   Status Achieved     PT LONG TERM GOAL #3   Title The patient will have improved Activities-Specific Balance Scale score from 78% to 85% confidence with ADLss   Baseline 73.73%   Period Weeks   Status On-going     PT LONG TERM GOAL #5   Baseline 16.92   Time 8   Period Weeks   Status On-going               Plan - 07/01/17 1048    Clinical Impression Statement [name] doing well today.  Some single leg stance balance and hip strengthening today which was tolerated well.  HEP updated with 4-way SLR with red TB closed in door.  Some work on treadmill today with p-ball kick to promote increased step length.     PT Treatment/Interventions ADLs/Self Care Home Management;Therapeutic activities;Therapeutic exercise;Neuromuscular re-education;Stair training;Functional mobility training;Patient/family education;Manual techniques;Gait training   PT Next Visit Plan KX modifier; balance and LE strength      Patient will benefit from skilled therapeutic intervention in order to improve the following deficits and impairments:  Decreased balance, Decreased activity tolerance, Decreased strength, Decreased mobility, Difficulty walking, Postural dysfunction  Visit Diagnosis: Other abnormalities of gait and mobility  Muscle weakness (generalized)  Unspecified lack of coordination  Unsteadiness on feet     Problem List Patient Active Problem List   Diagnosis Date Noted  . Urinary hesitancy 02/10/2017  . Primary hypercoagulable state ([place]) [D68.59] 02/09/2017  . Encounter for therapeutic drug monitoring 02/09/2017  .  CAD (coronary artery disease) 02/09/2017  . Anticoagulated 02/09/2017    Bess Harvest, PTA 07/01/17 1:52 PM  [facility] [address] [place], [place], [address] Phone: [phone]   Fax:  [phone]  [name] MRN: [id number] Date of Birth: 07/18/1946

## 2017-07-07 ENCOUNTER — Ambulatory Visit: Payer: Medicare Other

## 2017-07-07 ENCOUNTER — Telehealth: Payer: Self-pay | Admitting: Neurology

## 2017-07-07 ENCOUNTER — Ambulatory Visit (INDEPENDENT_AMBULATORY_CARE_PROVIDER_SITE_OTHER): Payer: Medicare Other | Admitting: Neurology

## 2017-07-07 DIAGNOSIS — G609 Hereditary and idiopathic neuropathy, unspecified: Secondary | ICD-10-CM

## 2017-07-07 DIAGNOSIS — R0789 Other chest pain: Secondary | ICD-10-CM

## 2017-07-07 DIAGNOSIS — R279 Unspecified lack of coordination: Secondary | ICD-10-CM

## 2017-07-07 DIAGNOSIS — R2681 Unsteadiness on feet: Secondary | ICD-10-CM | POA: Diagnosis not present

## 2017-07-07 DIAGNOSIS — R2689 Other abnormalities of gait and mobility: Secondary | ICD-10-CM

## 2017-07-07 DIAGNOSIS — E538 Deficiency of other specified B group vitamins: Secondary | ICD-10-CM

## 2017-07-07 DIAGNOSIS — G629 Polyneuropathy, unspecified: Secondary | ICD-10-CM

## 2017-07-07 DIAGNOSIS — M6281 Muscle weakness (generalized): Secondary | ICD-10-CM

## 2017-07-07 DIAGNOSIS — M546 Pain in thoracic spine: Secondary | ICD-10-CM

## 2017-07-07 DIAGNOSIS — M542 Cervicalgia: Secondary | ICD-10-CM

## 2017-07-07 NOTE — Therapy (Signed)
[facility] [address] [place], [place], [address] Phone: [phone]   Fax:  [phone]  Physical Therapy Treatment  Patient Details  [name] MRN: [id number] Date of Birth: Oct 14, 1946 Referring Provider: Dr. Betty Martinique  Encounter Date: 07/07/2017      PT End of Session - 07/07/17 1120    Visit Number 23   Number of Visits 30   Date for PT Re-Evaluation 07/21/17   Authorization Type  G code; KX MODIFIER NEEDED   PT Start Time 1107   PT Stop Time 1155   PT Time Calculation (min) 48 min   Activity Tolerance Patient tolerated treatment well   Behavior During Therapy [facility] for tasks assessed/performed      Past Medical History:  Diagnosis Date  . Allergy   . Arthritis   . 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 Cataracts, bilateral   . Chicken pox   . Dyslipidemia   . Hyperlipidemia   . Hypertension   . Sinus bradycardia     Past Surgical History:  Procedure Laterality Date  . BYPASS GRAFT  1996  . CATARACT EXTRACTION Right   . REFRACTIVE SURGERY Right   . SKIN BIOPSY    . UMBILICAL HERNIA REPAIR  2003    There were no vitals filed for this visit.      Subjective Assessment - 07/07/17 1116    Subjective Pt. doing well today however noting some "problems with equilibium" over past few days.     Patient Stated Goals regain balance; less of a hazard to my wife   Currently in Pain? No/denies   Pain Score 0-No pain   Multiple Pain Sites No                         [facility] Adult PT Treatment/Exercise - 07/07/17 1119      Lumbar Exercises: Aerobic   Stationary Bike Becumbent bike: lvl 3, 8 min    Tread Mill 6 min, 1.5 mph; with p-ball kick to encourage longer step length  cues required to avoid "leaning" on rails      Knee/Hip Exercises: Standing   Hip Flexion Right;Left;10 reps;Knee straight   Hip Flexion Limitations with green TB closed in door  standing on foam oval; 2 ski poles   Hip ADduction Right;Left;10 reps   Hip ADduction Limitations with green TB closed in door on foam oval: 2 ski poles   Hip Abduction Right;Left;10 reps;Knee straight   Abduction Limitations with green TB closed in door on foam oval; 2 ski poles   Hip Extension Right;Left;Knee straight;10 reps   Extension Limitations with green TB closed in door standing on foam oval; 2 ski poles    Other Standing Knee Exercises Alternating step-over<>step back with 6" bolster and cane x 10 reps each way; working on increasing hip/knee flexion (avoiding circumduction)                  PT Short Term Goals - 05/25/17 1025      PT SHORT TERM GOAL #1   Title The patient will express understanding of best assistive device to decrease risk of falls as well as other safety considerations (lighting, throw rugs)  04/28/17   Time 4   Period Weeks   Status Achieved     PT SHORT TERM GOAL #2   Title The patient will have improved Timed up and Go test to  25 sec indicating greater ease with sit to stand   Time 4   Period Weeks   Status Achieved     PT SHORT TERM GOAL #3   Title BERG balance test improved from 29/56 to 33/56 indicating decreased risk of falls   Time 4   Period Weeks   Status Achieved           PT Long Term Goals - 06/29/17 1028      PT LONG TERM GOAL #1   Title The patient will be independent with safe self progession of HEP needed for further improvements in balance and strength  05/26/17   Time 8   Period Weeks   Status On-going     PT LONG TERM GOAL #2   Title The patient will have improved LE strength to grossly 4/5 needed for greater ease with sit to stand and up and down steps   Time 8   Status Achieved     PT LONG TERM GOAL #3   Title The patient will have improved Activities-Specific Balance Scale score from 78% to 85% confidence with ADLss   Baseline 73.73%   Period Weeks   Status On-going     PT LONG TERM GOAL #5   Baseline  16.92   Time 8   Period Weeks   Status On-going               Plan - 07/07/17 1130    Clinical Impression Statement Pt. doing well today however noting some recent dizziness over last few days without known trigger.  Unable to reproduce dizziness in treatment today with balance activity.  4-way SLR advanced to standing on compliant surface today and tolerated well.  Treadmill walk-and-kick continued today to help promote increase in step length.  Pt. still with wide based ambulation pattern thus bolster step-over avoiding circumduction to promote improved hip/knee flexion.  Some carry over in treatment however pt. will continue to benefit from further skilled therapy to improved balance, LE strength, and improved safety with ambulation.       PT Treatment/Interventions ADLs/Self Care Home Management;Therapeutic activities;Therapeutic exercise;Neuromuscular re-education;Stair training;Functional mobility training;Patient/family education;Manual techniques;Gait training   PT Next Visit Plan KX modifier; balance and LE strength      Patient will benefit from skilled therapeutic intervention in order to improve the following deficits and impairments:  Decreased balance, Decreased activity tolerance, Decreased strength, Decreased mobility, Difficulty walking, Postural dysfunction  Visit Diagnosis: Other abnormalities of gait and mobility  Muscle weakness (generalized)  Unspecified lack of coordination  Unsteadiness on feet     Problem List Patient Active Problem List   Diagnosis Date Noted  . Urinary hesitancy 02/10/2017  . Primary hypercoagulable state ([place]) [D68.59] 02/09/2017  . Encounter for therapeutic drug monitoring 02/09/2017  . CAD (coronary artery disease) 02/09/2017  . Anticoagulated 02/09/2017    Bess Harvest, PTA 07/07/17 12:10 PM  [facility] [address], [place], [place], [address] Phone:  [phone]   Fax:  [phone]  [name] MRN: [id number] Date of Birth: 1946/08/01

## 2017-07-07 NOTE — Telephone Encounter (Signed)
-----   Message from Pieter Partridge, DO sent at 07/07/2017 11:20 AM EDT ----- Nerve test does demonstrate neuropathy.  I would like to check labs for other causes of neuropathy:  B6, TSH, SPEP/IFE, ANA, Sed rate

## 2017-07-07 NOTE — Telephone Encounter (Signed)
Patient made aware of results.   Agreeable to have labs drawn here. Orders entered. He will come to our office to check into lab downstairs.

## 2017-07-07 NOTE — Procedures (Signed)
[facility]  [place], [place]  [place], [place] [address] Tel: [phone] Fax:  [phone] Test Date:  07/07/2017  Patient: [name] DOB: 1946-03-26 Physician: Narda Amber, DO  Sex: Male Height: 6\' 3"  Ref Phys: Metta Clines, D.O.  ID#: [id number] Temp: 31.5C Technician:    Patient Complaints: This is a 71 year-old gentleman referred for evaluation of bilateral numbness and tingling of the feet.  NCV & EMG Findings: Extensive electrodiagnostic testing of the right lower extremity and additional studies of the left shows:  1. Right superficial peroneal sensory response shows markedly reduced amplitude (R1.0 V), and all other sensory responses including the lateral sural and left superficial peroneal nerves are absent.  2. Bilateral peroneal (EDB) and tibial motor responses show significantly reduced amplitudes and there is mild conduction velocity slowing involving the left peroneal and tibial nerves. The peroneal motor response at the tibialis anterior is within normal limits.  3. Bilateral tibial H reflex studies shows prolonged latencies.  4. Chronic motor axon loss changes are seen affecting the muscles below the knee, without accompanied active denervation. Proximal and deep muscles were not tested due to patient being on anticoagulation therapy.   Impression: The electrophysiologic findings are most consistent with a chronic and symmetric sensorimotor axonal polyneuropathy affecting the lower extremities.  Overall, these findings are severe in degree electrically.   ___________________________ Narda Amber, DO    Nerve Conduction Studies Anti Sensory Summary Table   Site NR Peak (ms) Norm Peak (ms) P-T Amp (V) Norm P-T Amp  Left Sup Peroneal Anti Sensory (Ant Lat Mall)  12 cm NR  <4.6  >3  Right Sup Peroneal Anti Sensory (Ant Lat Mall)  12 cm    3.7 <4.6 1.0 >3  Left Sural Anti Sensory (Lat Mall)  Calf NR  <4.6  >3  Right Sural Anti Sensory  (Lat Mall)  Calf NR  <4.6  >3   Motor Summary Table   Site NR Onset (ms) Norm Onset (ms) O-P Amp (mV) Norm O-P Amp Site1 Site2 Delta-0 (ms) Dist (cm) Vel (m/s) Norm Vel (m/s)  Left Peroneal Motor (Ext Dig Brev)  Ankle    3.8 <6.0 1.1 >2.5 B Fib Ankle 11.5 43.0 37 >40  B Fib    15.3  1.1  Poplt B Fib 1.7 7.0 41 >40  Poplt    17.0  1.0         Right Peroneal Motor (Ext Dig Brev)  Ankle    4.2 <6.0 1.0 >2.5 B Fib Ankle 10.7 43.0 40 >40  B Fib    14.9  0.8  Poplt B Fib 2.1 10.0 48 >40  Poplt    17.0  0.8         Left Peroneal TA Motor (Tib Ant)  Fib Head    4.2 <4.5 4.1 >3 Poplit Fib Head 1.5 9.0 60 >40  Poplit    5.7  4.1         Right Peroneal TA Motor (Tib Ant)  Fib Head    3.4 <4.5 4.0 >3 Poplit Fib Head 1.3 8.0 62 >40  Poplit    4.7  3.9         Left Tibial Motor (Abd Hall Brev)  Ankle    3.8 <6.0 1.0 >4 Knee Ankle 15.1 46.0 30 >40  Knee    18.9  0.5         Right Tibial Motor (Abd Hall Brev)  Ankle    4.3 <6.0  0.7 >4 Knee Ankle 11.6 47.0 41 >40  Knee    15.9  0.8          H Reflex Studies   NR H-Lat (ms) Lat Norm (ms) L-R H-Lat (ms)  Left Tibial (Gastroc)     40.00 <35 0.00  Right Tibial (Gastroc)     40.00 <35 0.00   EMG   Side Muscle Ins Act Fibs Psw Fasc Number Recrt Dur Dur. Amp Amp. Poly Poly. Comment  Left BicepsFemS Nml Nml Nml Nml Nml Nml Nml Nml Nml Nml Nml Nml N/A  Left RectFemoris Nml Nml Nml Nml Nml Nml Nml Nml Nml Nml Nml Nml N/A  Left Gastroc Nml Nml Nml Nml 2- Rapid Some 1+ Some 1+ Few 1+ N/A  Left AntTibialis Nml Nml Nml Nml 1- Rapid Some 1+ Few 1+ Few 1+ N/A  Right BicepsFemS Nml Nml Nml Nml Nml Nml Nml Nml Nml Nml Nml Nml N/A  Right RectFemoris Nml Nml Nml Nml Nml Nml Nml Nml Nml Nml Nml Nml N/A  Right Gastroc Nml Nml Nml Nml 2- Rapid Some 1+ Some 1+ Few 1+ N/A  Right AntTibialis Nml Nml Nml Nml 2- Rapid Some 1+ Some 1+ Few 1+ N/A      Waveforms:

## 2017-07-14 DIAGNOSIS — D485 Neoplasm of uncertain behavior of skin: Secondary | ICD-10-CM | POA: Diagnosis not present

## 2017-07-15 ENCOUNTER — Ambulatory Visit: Payer: Medicare Other | Attending: Family Medicine

## 2017-07-15 DIAGNOSIS — R2681 Unsteadiness on feet: Secondary | ICD-10-CM | POA: Diagnosis not present

## 2017-07-15 DIAGNOSIS — M5431 Sciatica, right side: Secondary | ICD-10-CM | POA: Diagnosis not present

## 2017-07-15 DIAGNOSIS — R2689 Other abnormalities of gait and mobility: Secondary | ICD-10-CM | POA: Diagnosis not present

## 2017-07-15 DIAGNOSIS — M9905 Segmental and somatic dysfunction of pelvic region: Secondary | ICD-10-CM | POA: Diagnosis not present

## 2017-07-15 DIAGNOSIS — M9903 Segmental and somatic dysfunction of lumbar region: Secondary | ICD-10-CM | POA: Diagnosis not present

## 2017-07-15 DIAGNOSIS — M6281 Muscle weakness (generalized): Secondary | ICD-10-CM | POA: Insufficient documentation

## 2017-07-15 DIAGNOSIS — M9902 Segmental and somatic dysfunction of thoracic region: Secondary | ICD-10-CM | POA: Diagnosis not present

## 2017-07-15 DIAGNOSIS — M5136 Other intervertebral disc degeneration, lumbar region: Secondary | ICD-10-CM | POA: Diagnosis not present

## 2017-07-15 DIAGNOSIS — M5134 Other intervertebral disc degeneration, thoracic region: Secondary | ICD-10-CM | POA: Diagnosis not present

## 2017-07-15 DIAGNOSIS — L570 Actinic keratosis: Secondary | ICD-10-CM | POA: Diagnosis not present

## 2017-07-15 DIAGNOSIS — R279 Unspecified lack of coordination: Secondary | ICD-10-CM | POA: Diagnosis not present

## 2017-07-15 DIAGNOSIS — D0439 Carcinoma in situ of skin of other parts of face: Secondary | ICD-10-CM | POA: Diagnosis not present

## 2017-07-15 NOTE — Therapy (Signed)
[facility] [address] [place], [place], [address] Phone: [phone]   Fax:  [phone]  Physical Therapy Treatment  Patient Details  [name] MRN: [id number] Date of Birth: [birth year] Referring Provider: Dr. Betty Martinique  Encounter Date: 07/15/2017      PT End of Session - 07/15/17 1026    Visit Number 24   Number of Visits 30   Date for PT Re-Evaluation 07/21/17   Authorization Type  G code; KX MODIFIER NEEDED   PT Start Time 1021  pt. arrived late    PT Stop Time 1100   PT Time Calculation (min) 39 min   Activity Tolerance Patient tolerated treatment well   Behavior During Therapy [facility] for tasks assessed/performed      Past Medical History:  Diagnosis Date  . Allergy   . Arthritis   . 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 Cataracts, bilateral   . Chicken pox   . Dyslipidemia   . Hyperlipidemia   . Hypertension   . Sinus bradycardia     Past Surgical History:  Procedure Laterality Date  . BYPASS GRAFT  1996  . CATARACT EXTRACTION Right   . REFRACTIVE SURGERY Right   . SKIN BIOPSY    . UMBILICAL HERNIA REPAIR  2003    There were no vitals filed for this visit.      Subjective Assessment - 07/15/17 1028    Subjective Pt. noting he is still having "equilibium problems".  Notes using cane less while walking in neighborhood.     Patient Stated Goals regain balance; less of a hazard to my wife   Currently in Pain? No/denies   Pain Score 0-No pain   Multiple Pain Sites No                         [facility] Adult PT Treatment/Exercise - 07/15/17 1044      Lumbar Exercises: Aerobic   Stationary Bike Becumbent bike: lvl 4, 8 min      Knee/Hip Exercises: Standing   Hip Flexion Right;Left;15 reps;Knee straight   Hip Flexion Limitations with green TB closed in door    Hip ADduction Right;Left;15 reps   Hip ADduction Limitations with green TB closed in  door    Hip Abduction Right;Left;15 reps;Knee straight   Abduction Limitations with green TB closed in door    Hip Extension Right;Left;15 reps;Knee straight   Extension Limitations with green TB closed in door    Other Standing Knee Exercises Forward toe clears to 12" step with cane x 15 reps each side; good stability with this                  PT Short Term Goals - 05/25/17 1025      PT SHORT TERM GOAL #1   Title The patient will express understanding of best assistive device to decrease risk of falls as well as other safety considerations (lighting, throw rugs)  04/28/17   Time 4   Period Weeks   Status Achieved     PT SHORT TERM GOAL #2   Title The patient will have improved Timed up and Go test to 25 sec indicating greater ease with sit to stand   Time 4   Period Weeks   Status Achieved     PT SHORT TERM GOAL #3   Title BERG balance test improved from 29/56 to 33/56 indicating decreased  risk of falls   Time 4   Period Weeks   Status Achieved           PT Long Term Goals - 06/29/17 1028      PT LONG TERM GOAL #1   Title The patient will be independent with safe self progession of HEP needed for further improvements in balance and strength  05/26/17   Time 8   Period Weeks   Status On-going     PT LONG TERM GOAL #2   Title The patient will have improved LE strength to grossly 4/5 needed for greater ease with sit to stand and up and down steps   Time 8   Status Achieved     PT LONG TERM GOAL #3   Title The patient will have improved Activities-Specific Balance Scale score from 78% to 85% confidence with ADLss   Baseline 73.73%   Period Weeks   Status On-going     PT LONG TERM GOAL #5   Baseline 16.92   Time 8   Period Weeks   Status On-going               Plan - 07/15/17 1028    Clinical Impression Statement Pt. doing well today.  Notes not using cane in house now and using cane less while walking in neighborhood due to feeling more  stable from therapy.  Still with wide base of support and shortened step length with gait pattern.  Tolerated progression in LE strengthening and single leg balance activities well today.  Notes he is still consistently performing 4-way SLR at home.  Seems to be progressing well.     PT Treatment/Interventions ADLs/Self Care Home Management;Therapeutic activities;Therapeutic exercise;Neuromuscular re-education;Stair training;Functional mobility training;Patient/family education;Manual techniques;Gait training   PT Next Visit Plan KX modifier; balance and LE strength      Patient will benefit from skilled therapeutic intervention in order to improve the following deficits and impairments:  Decreased balance, Decreased activity tolerance, Decreased strength, Decreased mobility, Difficulty walking, Postural dysfunction  Visit Diagnosis: Other abnormalities of gait and mobility  Muscle weakness (generalized)  Unspecified lack of coordination  Unsteadiness on feet     Problem List Patient Active Problem List   Diagnosis Date Noted  . Urinary hesitancy 02/10/2017  . Primary hypercoagulable state ([place]) [D68.59] 02/09/2017  . Encounter for therapeutic drug monitoring 02/09/2017  . CAD (coronary artery disease) 02/09/2017  . Anticoagulated 02/09/2017    Bess Harvest, PTA 07/15/17 12:06 PM  [facility] [address], [place], [place], [address] Phone: [phone]   Fax:  [phone]  [name] MRN: [id number] Date of Birth: 1946-10-15

## 2017-07-17 ENCOUNTER — Ambulatory Visit (INDEPENDENT_AMBULATORY_CARE_PROVIDER_SITE_OTHER): Payer: Medicare Other | Admitting: *Deleted

## 2017-07-17 ENCOUNTER — Ambulatory Visit: Payer: Medicare Other | Admitting: Rehabilitative and Restorative Service Providers"

## 2017-07-17 DIAGNOSIS — R2681 Unsteadiness on feet: Secondary | ICD-10-CM

## 2017-07-17 DIAGNOSIS — R2689 Other abnormalities of gait and mobility: Secondary | ICD-10-CM

## 2017-07-17 DIAGNOSIS — D6859 Other primary thrombophilia: Secondary | ICD-10-CM | POA: Diagnosis not present

## 2017-07-17 DIAGNOSIS — M6281 Muscle weakness (generalized): Secondary | ICD-10-CM | POA: Diagnosis not present

## 2017-07-17 DIAGNOSIS — Z5181 Encounter for therapeutic drug level monitoring: Secondary | ICD-10-CM

## 2017-07-17 DIAGNOSIS — R279 Unspecified lack of coordination: Secondary | ICD-10-CM | POA: Diagnosis not present

## 2017-07-17 DIAGNOSIS — I2510 Atherosclerotic heart disease of native coronary artery without angina pectoris: Secondary | ICD-10-CM

## 2017-07-17 LAB — POCT INR: INR: 1.4

## 2017-07-17 NOTE — Therapy (Signed)
[facility] [address], [place], [place], [address] Phone: [phone]   Fax:  [phone]  Physical Therapy Treatment  Patient Details  [name] MRN: [id number] Date of Birth: 1946/09/25 Referring Provider: Dr. Betty Martinique  Encounter Date: 07/17/2017      PT End of Session - 07/17/17 1037    Visit Number 25   Number of Visits 30   Date for PT Re-Evaluation 07/21/17   Authorization Type  G code; KX MODIFIER NEEDED   PT Start Time 1017   PT Stop Time 1059   PT Time Calculation (min) 42 min   Activity Tolerance Patient tolerated treatment well;No increased pain   Behavior During Therapy WFL for tasks assessed/performed      Past Medical History:  Diagnosis Date  . Allergy   . Arthritis   . 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 Cataracts, bilateral   . Chicken pox   . Dyslipidemia   . Hyperlipidemia   . Hypertension   . Sinus bradycardia     Past Surgical History:  Procedure Laterality Date  . BYPASS GRAFT  1996  . CATARACT EXTRACTION Right   . REFRACTIVE SURGERY Right   . SKIN BIOPSY    . UMBILICAL HERNIA REPAIR  2003    There were no vitals filed for this visit.      Subjective Assessment - 07/17/17 1020    Subjective 8/10 pain this morning and I have a kink in my neck   Currently in Pain? Yes   Pain Score 8    Pain Location Neck   Pain Orientation Right;Left   Pain Descriptors / Indicators Jabbing;Sore   Pain Type Acute pain   Pain Frequency Other (Comment)  happened this morning upon awakening                         [facility] Adult PT Treatment/Exercise - 07/17/17 0001      Lumbar Exercises: Aerobic   Stationary Bike Bike level 5 x 5 min with PT verbal cues for quad contraction     Knee/Hip Exercises: Standing   Other Standing Knee Exercises 3 lb hip abdct x 20, 3 lb hamstring curl x 20, 3 lb hip ext with glute set x 20; 3 lb hip circles  clockwise and counterclockwise x 20 each; 6 inch step taps frontally/laterally x 20 each; standing SLR off of 6 inch step x 20; 6 inch step ups frontally x 20 each side; hamstring stretch x 30 sec                  PT Short Term Goals - 05/25/17 1025      PT SHORT TERM GOAL #1   Title The patient will express understanding of best assistive device to decrease risk of falls as well as other safety considerations (lighting, throw rugs)  04/28/17   Time 4   Period Weeks   Status Achieved     PT SHORT TERM GOAL #2   Title The patient will have improved Timed up and Go test to 25 sec indicating greater ease with sit to stand   Time 4   Period Weeks   Status Achieved     PT SHORT TERM GOAL #3   Title BERG balance test improved from 29/56 to 33/56 indicating decreased risk of falls   Time 4   Period Weeks   Status Achieved  PT Long Term Goals - 07/17/17 1035      PT LONG TERM GOAL #1   Title The patient will be independent with safe self progession of HEP needed for further improvements in balance and strength  05/26/17   Time 8   Period Weeks   Status On-going     PT LONG TERM GOAL #2   Title The patient will have improved LE strength to grossly 4/5 needed for greater ease with sit to stand and up and down steps   Time 8   Period Weeks   Status Achieved     PT LONG TERM GOAL #3   Title The patient will have improved Activities-Specific Balance Scale score from 78% to 85% confidence with ADLss   Time 8   Period Weeks   Status On-going     PT LONG TERM GOAL #4   Title BERG balance score improved to 50/56 indicating decreased fall risk    Time 8   Period Weeks   Status On-going     PT LONG TERM GOAL #5   Title Timed up and Go score improved to 13 sec indicating improved gait speed, mobility with sit to stand and decreased fall risk with a cane   Time 8   Period Weeks   Status On-going     PT LONG TERM GOAL #6   Title stand with one foot ahead of  the other for 30 seconds due to increased hip strength and balance so he is able to walk through a smaller doorway   Baseline staggered step   Time 8   Period Weeks   Status On-going               Plan - 07/17/17 1032    Clinical Impression Statement Pt arrived stating he had a kink in his neck from last night and pain to be 8/10 radiating to scapula. No other complaints. Pt would benefit from further LE strengthening, balance and GT. Pt amb with wide base of support and needs 2-3 sec to walk without a stiff legged gait.    Rehab Potential Good   Clinical Impairments Affecting Rehab Potential high risk of falls   PT Frequency 2x / week   PT Duration 8 weeks   PT Treatment/Interventions ADLs/Self Care Home Management;Therapeutic activities;Therapeutic exercise;Neuromuscular re-education;Stair training;Functional mobility training;Patient/family education;Manual techniques;Gait training   PT Next Visit Plan  balance and LE strength; Gcode, KX modifier; recert due 7/40/81   PT Home Exercise Plan update HEP   Consulted and Agree with Plan of Care Patient      Patient will benefit from skilled therapeutic intervention in order to improve the following deficits and impairments:  Decreased balance, Decreased activity tolerance, Decreased strength, Decreased mobility, Difficulty walking, Postural dysfunction  Visit Diagnosis: Other abnormalities of gait and mobility  Muscle weakness (generalized)  Unspecified lack of coordination  Unsteadiness on feet     Problem List Patient Active Problem List   Diagnosis Date Noted  . Urinary hesitancy 02/10/2017  . Primary hypercoagulable state ([place]) [D68.59] 02/09/2017  . Encounter for therapeutic drug monitoring 02/09/2017  . CAD (coronary artery disease) 02/09/2017  . Anticoagulated 02/09/2017    Jannely Henthorn, PT 07/17/2017, 10:56 AM  [facility] [address] [place], [place], [address] Phone: [phone]   Fax:  [phone]  [name] MRN: [id number] Date of Birth: 1946-11-11

## 2017-07-20 ENCOUNTER — Ambulatory Visit: Payer: Medicare Other | Admitting: Rehabilitative and Restorative Service Providers"

## 2017-07-20 ENCOUNTER — Telehealth: Payer: Self-pay | Admitting: Neurology

## 2017-07-20 DIAGNOSIS — R2681 Unsteadiness on feet: Secondary | ICD-10-CM

## 2017-07-20 DIAGNOSIS — R2689 Other abnormalities of gait and mobility: Secondary | ICD-10-CM

## 2017-07-20 DIAGNOSIS — M6281 Muscle weakness (generalized): Secondary | ICD-10-CM

## 2017-07-20 DIAGNOSIS — R279 Unspecified lack of coordination: Secondary | ICD-10-CM

## 2017-07-20 NOTE — Telephone Encounter (Signed)
Patient wife wants to know if the blood work orders are put in the system and where do they need to go for labs  Was not sure if he could go downstairs for the type of labs he needs to be done

## 2017-07-20 NOTE — Telephone Encounter (Signed)
Spoke w/[name] (wife)advsd her orders were entered on 7/31 and are still active for labs. Advsd her to come and check-in at our office first.

## 2017-07-20 NOTE — Therapy (Signed)
[facility] [address], [place], [place], [address] Phone: [phone]   Fax:  [phone]  Physical Therapy Treatment/Recert  Patient Details  [name] MRN: [id number] Date of Birth: 03-06-1946 Referring Provider: Dr. Betty Martinique  Encounter Date: 07/20/2017      PT End of Session - 07/20/17 1016    Visit Number 26   Number of Visits 30   Date for PT Re-Evaluation 08/17/17   Authorization Type  G code; KX MODIFIER NEEDED   PT Start Time 1015   PT Stop Time 1055   PT Time Calculation (min) 40 min   Activity Tolerance Patient tolerated treatment well;No increased pain   Behavior During Therapy WFL for tasks assessed/performed      Past Medical History:  Diagnosis Date  . Allergy   . Arthritis   . 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 Cataracts, bilateral   . Chicken pox   . Dyslipidemia   . Hyperlipidemia   . Hypertension   . Sinus bradycardia     Past Surgical History:  Procedure Laterality Date  . BYPASS GRAFT  1996  . CATARACT EXTRACTION Right   . REFRACTIVE SURGERY Right   . SKIN BIOPSY    . UMBILICAL HERNIA REPAIR  2003    There were no vitals filed for this visit.      Subjective Assessment - 07/20/17 1027    Subjective Had a great weekend. Less Pressure on my neck today.    Patient Stated Goals regain balance; less of a hazard to my wife   Currently in Pain? No/denies                         [facility] Adult PT Treatment/Exercise - 07/20/17 0001      Neuro Re-ed    Neuro Re-ed Details  Berg 47/56; assessed safety with mobility on uneven terrain (sidewalk); pt did not use cane and amb with same wide base of support as prior w/o balance deficit     Lumbar Exercises: Aerobic   Stationary Bike NuStep L5 x 6 min LEs only with PT verbal cues for quad contraction     Shoulder Exercises: Standing   Other Standing Exercises 4 lb hamstring curl x  20; 4 lb donkey kick x 20 with increased difficulty maintaining position L LE; 4 lb hip abdct x 20 with knee flex/ext both x 20 each; 4 lb diagonal hip ext kick x 20; squats x 20; lunges x 20                  PT Short Term Goals - 05/25/17 1025      PT SHORT TERM GOAL #1   Title The patient will express understanding of best assistive device to decrease risk of falls as well as other safety considerations (lighting, throw rugs)  04/28/17   Time 4   Period Weeks   Status Achieved     PT SHORT TERM GOAL #2   Title The patient will have improved Timed up and Go test to 25 sec indicating greater ease with sit to stand   Time 4   Period Weeks   Status Achieved     PT SHORT TERM GOAL #3   Title BERG balance test improved from 29/56 to 33/56 indicating decreased risk of falls   Time 4   Period Weeks   Status Achieved  PT Long Term Goals - 08-13-17 1036      PT LONG TERM GOAL #1   Title The patient will be independent with safe self progession of HEP needed for further improvements in balance and strength  05/26/17   Time 4   Period Weeks   Status On-going   Target Date 08/17/17     PT LONG TERM GOAL #2   Title The patient will have improved LE strength to grossly 4+/5 needed for greater ease with sit to stand and up and down steps   Baseline 4/5   Time 4   Period Weeks   Status Revised   Target Date 08/17/17     PT LONG TERM GOAL #3   Title The patient will have improved Activities-Specific Balance Scale score from 78% to 85% confidence with ADLss   Baseline not tested   Time 4   Period Weeks   Status On-going     PT LONG TERM GOAL #4   Title BERG balance score improved to 50/56 indicating decreased fall risk    Baseline 47/56   Time 4   Period Weeks   Status On-going   Target Date 08/17/17     PT LONG TERM GOAL #5   Title Timed up and Go score improved to 12 sec indicating improved gait speed, mobility with sit to stand and decreased fall risk  with a cane   Baseline 14.3   Time 4   Period Weeks   Status On-going   Target Date 08/17/17     PT LONG TERM GOAL #6   Title stand with one foot ahead of the other for 45 seconds due to increased hip strength and balance so he is able to walk through a smaller doorway   Baseline 31 sec   Time 4   Period Weeks   Status Revised               Plan - 2017-08-13 1030    Clinical Impression Statement Pt continues to complain of decreased balance especially when out in community. States at least 60% improvement in functional strength. Pt would benefit from continued balance training on even/uneven terrain to increase function and decrease falls risk. Recert written this visit with goals updated as needed.   Rehab Potential Good   Clinical Impairments Affecting Rehab Potential high risk of falls   PT Frequency 2x / week   PT Duration 4 weeks   PT Treatment/Interventions ADLs/Self Care Home Management;Therapeutic activities;Therapeutic exercise;Neuromuscular re-education;Stair training;Functional mobility training;Patient/family education;Manual techniques;Gait training   PT Next Visit Plan  balance and LE strength bil   PT Home Exercise Plan update HEP   Consulted and Agree with Plan of Care Patient      Patient will benefit from skilled therapeutic intervention in order to improve the following deficits and impairments:  Decreased balance, Decreased activity tolerance, Decreased strength, Decreased mobility, Difficulty walking, Postural dysfunction  Visit Diagnosis: Other abnormalities of gait and mobility - Plan: PT plan of care cert/re-cert  Muscle weakness (generalized) - Plan: PT plan of care cert/re-cert  Unsteadiness on feet - Plan: PT plan of care cert/re-cert  Unspecified lack of coordination - Plan: PT plan of care cert/re-cert       G-Codes - 08-13-17 1057    Functional Assessment Tool Used (Outpatient Only) Berg 47/56   Functional Limitation Mobility: Walking  and moving around   Mobility: Walking and Moving Around Current Status (T0177) At least 1 percent but less than 20 percent  impaired, limited or restricted   Mobility: Walking and Moving Around Goal Status [phone]) At least 1 percent but less than 20 percent impaired, limited or restricted      Problem List Patient Active Problem List   Diagnosis Date Noted  . Urinary hesitancy 02/10/2017  . Primary hypercoagulable state ([place]) [D68.59] 02/09/2017  . Encounter for therapeutic drug monitoring 02/09/2017  . CAD (coronary artery disease) 02/09/2017  . Anticoagulated 02/09/2017    ARTIS,Shereta Crothers, PT 07/20/2017, 11:07 AM  [facility] [address] [place], [place], [address] Phone: [phone]   Fax:  [phone]  [name] MRN: [id number] Date of Birth: 1946-02-13

## 2017-07-21 ENCOUNTER — Other Ambulatory Visit: Payer: Medicare Other

## 2017-07-21 DIAGNOSIS — G629 Polyneuropathy, unspecified: Secondary | ICD-10-CM

## 2017-07-21 LAB — TSH: TSH: 1.05 mIU/L (ref 0.40–4.50)

## 2017-07-22 DIAGNOSIS — D0439 Carcinoma in situ of skin of other parts of face: Secondary | ICD-10-CM | POA: Diagnosis not present

## 2017-07-22 LAB — SEDIMENTATION RATE: SED RATE: 1 mm/h (ref 0–20)

## 2017-07-22 LAB — ANA: ANA: NEGATIVE

## 2017-07-24 ENCOUNTER — Ambulatory Visit (INDEPENDENT_AMBULATORY_CARE_PROVIDER_SITE_OTHER): Payer: Medicare Other | Admitting: Pharmacist

## 2017-07-24 DIAGNOSIS — D6859 Other primary thrombophilia: Secondary | ICD-10-CM | POA: Diagnosis not present

## 2017-07-24 DIAGNOSIS — I2510 Atherosclerotic heart disease of native coronary artery without angina pectoris: Secondary | ICD-10-CM

## 2017-07-24 DIAGNOSIS — Z5181 Encounter for therapeutic drug level monitoring: Secondary | ICD-10-CM

## 2017-07-24 LAB — POCT INR: INR: 2.6

## 2017-07-25 LAB — VITAMIN B6: VITAMIN B6: 42.2 ng/mL — AB (ref 2.1–21.7)

## 2017-07-27 ENCOUNTER — Ambulatory Visit: Payer: Medicare Other | Admitting: Rehabilitation

## 2017-07-27 ENCOUNTER — Encounter: Payer: Self-pay | Admitting: Rehabilitation

## 2017-07-27 DIAGNOSIS — R2689 Other abnormalities of gait and mobility: Secondary | ICD-10-CM

## 2017-07-27 LAB — PROTEIN ELECTROPHORESIS, SERUM
ALBUMIN ELP: 4.6 g/dL (ref 3.8–4.8)
ALPHA-1-GLOBULIN: 0.3 g/dL (ref 0.2–0.3)
Alpha-2-Globulin: 0.6 g/dL (ref 0.5–0.9)
BETA 2: 0.3 g/dL (ref 0.2–0.5)
Beta Globulin: 0.5 g/dL (ref 0.4–0.6)
Gamma Globulin: 0.7 g/dL — ABNORMAL LOW (ref 0.8–1.7)
TOTAL PROTEIN, SERUM ELECTROPHOR: 6.9 g/dL (ref 6.1–8.1)

## 2017-07-27 LAB — IMMUNOFIXATION ELECTROPHORESIS
IMMUNOGLOBULIN M: 149 mg/dL (ref 48–271)
Immunoglobulin A: 89 mg/dL (ref 81–463)
Immunoglobulin G: 719 mg/dL (ref 694–1618)

## 2017-07-27 NOTE — Therapy (Signed)
[facility] [address], [place], [place], [address] Phone: [phone]   Fax:  [phone]  Physical Therapy Treatment  Patient Details  Name: DHANVIN SZETO MRN: [id number] Date of Birth: 13-Apr-1946 Referring Provider: Dr. Betty Martinique  Encounter Date: 07/27/2017    Past Medical History:  Diagnosis Date  . Allergy   . Arthritis   . 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 Cataracts, bilateral   . Chicken pox   . Dyslipidemia   . Hyperlipidemia   . Hypertension   . Sinus bradycardia     Past Surgical History:  Procedure Laterality Date  . BYPASS GRAFT  1996  . CATARACT EXTRACTION Right   . REFRACTIVE SURGERY Right   . SKIN BIOPSY    . UMBILICAL HERNIA REPAIR  2003    There were no vitals filed for this visit.      Subjective Assessment - 07/27/17 0841    Subjective Had a skin cancer removal surgery last week and reported it went well but he would only like to do the nustep today due to exercise restrictions.  Will check appointment for later  this week to see if he can return to other exercises. Pt just to ride Nustep independently x 28min without charge/visit today   Currently in Pain? No/denies                                   PT Short Term Goals - 05/25/17 1025      PT SHORT TERM GOAL #1   Title The patient will express understanding of best assistive device to decrease risk of falls as well as other safety considerations (lighting, throw rugs)  04/28/17   Time 4   Period Weeks   Status Achieved     PT SHORT TERM GOAL #2   Title The patient will have improved Timed up and Go test to 25 sec indicating greater ease with sit to stand   Time 4   Period Weeks   Status Achieved     PT SHORT TERM GOAL #3   Title BERG balance test improved from 29/56 to 33/56 indicating decreased risk of falls   Time 4   Period Weeks   Status Achieved           PT  Long Term Goals - 07/20/17 1036      PT LONG TERM GOAL #1   Title The patient will be independent with safe self progession of HEP needed for further improvements in balance and strength  05/26/17   Time 4   Period Weeks   Status On-going   Target Date 08/17/17     PT LONG TERM GOAL #2   Title The patient will have improved LE strength to grossly 4+/5 needed for greater ease with sit to stand and up and down steps   Baseline 4/5   Time 4   Period Weeks   Status Revised   Target Date 08/17/17     PT LONG TERM GOAL #3   Title The patient will have improved Activities-Specific Balance Scale score from 78% to 85% confidence with ADLss   Baseline not tested   Time 4   Period Weeks   Status On-going     PT LONG TERM GOAL #4   Title BERG balance score improved to 50/56 indicating decreased fall risk    Baseline  47/56   Time 4   Period Weeks   Status On-going   Target Date 08/17/17     PT LONG TERM GOAL #5   Title Timed up and Go score improved to 12 sec indicating improved gait speed, mobility with sit to stand and decreased fall risk with a cane   Baseline 14.3   Time 4   Period Weeks   Status On-going   Target Date 08/17/17     PT LONG TERM GOAL #6   Title stand with one foot ahead of the other for 45 seconds due to increased hip strength and balance so he is able to walk through a smaller doorway   Baseline 31 sec   Time 4   Period Weeks   Status Revised             Patient will benefit from skilled therapeutic intervention in order to improve the following deficits and impairments:     Visit Diagnosis: Other abnormalities of gait and mobility     Problem List Patient Active Problem List   Diagnosis Date Noted  . Urinary hesitancy 02/10/2017  . Primary hypercoagulable state ([place]) [D68.59] 02/09/2017  . Encounter for therapeutic drug monitoring 02/09/2017  . CAD (coronary artery disease) 02/09/2017  . Anticoagulated 02/09/2017    Stark Bray, DPT,  CMP 07/27/2017, 8:58 AM  [facility] [address], [place], [place], [address] Phone: [phone]   Fax:  [phone]  Name: REYNOLDS KITTEL MRN: [id number] Date of Birth: 08/16/1946

## 2017-07-28 ENCOUNTER — Telehealth: Payer: Self-pay

## 2017-07-28 NOTE — Telephone Encounter (Signed)
Called Pt, Advsd of normal labs other than elevated B6, He is not taking additional B6.

## 2017-07-28 NOTE — Telephone Encounter (Signed)
-----   Message from Alda Berthold, DO sent at 07/27/2017  1:11 PM EDT ----- Please notify patient's lab are within normal limits.  His vitamin B6 is elevated and if he is taking extra B6 supplements, he can discontinue this, if not, then it is nothing that would cause his symptoms.   Thank you.

## 2017-07-29 DIAGNOSIS — M9902 Segmental and somatic dysfunction of thoracic region: Secondary | ICD-10-CM | POA: Diagnosis not present

## 2017-07-29 DIAGNOSIS — M5431 Sciatica, right side: Secondary | ICD-10-CM | POA: Diagnosis not present

## 2017-07-29 DIAGNOSIS — M9905 Segmental and somatic dysfunction of pelvic region: Secondary | ICD-10-CM | POA: Diagnosis not present

## 2017-07-29 DIAGNOSIS — M5134 Other intervertebral disc degeneration, thoracic region: Secondary | ICD-10-CM | POA: Diagnosis not present

## 2017-07-29 DIAGNOSIS — M5136 Other intervertebral disc degeneration, lumbar region: Secondary | ICD-10-CM | POA: Diagnosis not present

## 2017-07-29 DIAGNOSIS — M9903 Segmental and somatic dysfunction of lumbar region: Secondary | ICD-10-CM | POA: Diagnosis not present

## 2017-07-30 ENCOUNTER — Ambulatory Visit: Payer: Medicare Other

## 2017-07-30 DIAGNOSIS — R2689 Other abnormalities of gait and mobility: Secondary | ICD-10-CM

## 2017-07-30 DIAGNOSIS — M6281 Muscle weakness (generalized): Secondary | ICD-10-CM

## 2017-07-30 DIAGNOSIS — R2681 Unsteadiness on feet: Secondary | ICD-10-CM | POA: Diagnosis not present

## 2017-07-30 DIAGNOSIS — R279 Unspecified lack of coordination: Secondary | ICD-10-CM

## 2017-07-30 NOTE — Therapy (Signed)
[facility] [address] [place], [place], [address] Phone: [phone]   Fax:  [phone]  Physical Therapy Treatment  Patient Details  [name] MRN: [id number] Date of Birth: [birth year] Referring Provider: Dr. Betty Martinique  Encounter Date: 07/30/2017      PT End of Session - 07/30/17 0958    Visit Number 27   Number of Visits 30   Date for PT Re-Evaluation 08/17/17   Authorization Type  G code; KX MODIFIER NEEDED   PT Start Time 0932   PT Stop Time 1015   PT Time Calculation (min) 43 min   Activity Tolerance Patient tolerated treatment well;No increased pain   Behavior During Therapy WFL for tasks assessed/performed      Past Medical History:  Diagnosis Date  . Allergy   . Arthritis   . 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 Cataracts, bilateral   . Chicken pox   . Dyslipidemia   . Hyperlipidemia   . Hypertension   . Sinus bradycardia     Past Surgical History:  Procedure Laterality Date  . BYPASS GRAFT  1996  . CATARACT EXTRACTION Right   . REFRACTIVE SURGERY Right   . SKIN BIOPSY    . UMBILICAL HERNIA REPAIR  2003    There were no vitals filed for this visit.      Subjective Assessment - 07/30/17 0937    Subjective Pt. noting "pressure" over forehead at site of cancer removal.     Patient Stated Goals regain balance; less of a hazard to my wife   Currently in Pain? No/denies   Pain Score 0-No pain   Multiple Pain Sites No                         [facility] Adult PT Treatment/Exercise - 07/30/17 0954      Lumbar Exercises: Aerobic   Stationary Bike NuStep L5 x 6 min LEs only with PT verbal cues for quad contraction   Tread Mill 4 min, 1.5 mph; with p-ball kick to encourage longer step length     Lumbar Exercises: Sidelying   Other Sidelying Lumbar Exercises B sidelying hip adduction 4# x 15 reps     Knee/Hip Exercises: Standing   Heel  Raises 20 reps;3 seconds   Hip Flexion Right;Left;Knee straight;10 reps   Hip Abduction Right;Left;10 reps;Knee straight   Abduction Limitations 4#    Hip Extension Right;Left;Knee straight;10 reps   Extension Limitations 4#    Functional Squat 15 reps;3 seconds   Functional Squat Limitations cues for LE spacing and upright posture                  PT Short Term Goals - 05/25/17 1025      PT SHORT TERM GOAL #1   Title The patient will express understanding of best assistive device to decrease risk of falls as well as other safety considerations (lighting, throw rugs)  04/28/17   Time 4   Period Weeks   Status Achieved     PT SHORT TERM GOAL #2   Title The patient will have improved Timed up and Go test to 25 sec indicating greater ease with sit to stand   Time 4   Period Weeks   Status Achieved     PT SHORT TERM GOAL #3   Title BERG balance test improved from 29/56 to 33/56 indicating decreased risk of falls  Time 4   Period Weeks   Status Achieved           PT Long Term Goals - 07/20/17 1036      PT LONG TERM GOAL #1   Title The patient will be independent with safe self progession of HEP needed for further improvements in balance and strength  05/26/17   Time 4   Period Weeks   Status On-going   Target Date 08/17/17     PT LONG TERM GOAL #2   Title The patient will have improved LE strength to grossly 4+/5 needed for greater ease with sit to stand and up and down steps   Baseline 4/5   Time 4   Period Weeks   Status Revised   Target Date 08/17/17     PT LONG TERM GOAL #3   Title The patient will have improved Activities-Specific Balance Scale score from 78% to 85% confidence with ADLss   Baseline not tested   Time 4   Period Weeks   Status On-going     PT LONG TERM GOAL #4   Title BERG balance score improved to 50/56 indicating decreased fall risk    Baseline 47/56   Time 4   Period Weeks   Status On-going   Target Date 08/17/17     PT  LONG TERM GOAL #5   Title Timed up and Go score improved to 12 sec indicating improved gait speed, mobility with sit to stand and decreased fall risk with a cane   Baseline 14.3   Time 4   Period Weeks   Status On-going   Target Date 08/17/17     PT LONG TERM GOAL #6   Title stand with one foot ahead of the other for 45 seconds due to increased hip strength and balance so he is able to walk through a smaller doorway   Baseline 31 sec   Time 4   Period Weeks   Status Revised               Plan - 07/30/17 1442    Clinical Impression Statement Ashden doing well today noting improved status since skin cancer removal feeling much better today.  Treatment focusing on LE strengthening activities.  Pt. tolerated all activities well.  Seen with wide based gait and shortened step length with SPC thus treadmill p-ball kick continued today to improve step length and narrow base of support.  Will continued to progress strengthening and balance activities per pt. tolerance in coming visits.   PT Treatment/Interventions ADLs/Self Care Home Management;Therapeutic activities;Therapeutic exercise;Neuromuscular re-education;Stair training;Functional mobility training;Patient/family education;Manual techniques;Gait training   PT Next Visit Plan  balance and LE strength bil      Patient will benefit from skilled therapeutic intervention in order to improve the following deficits and impairments:  Decreased balance, Decreased activity tolerance, Decreased strength, Decreased mobility, Difficulty walking, Postural dysfunction  Visit Diagnosis: Other abnormalities of gait and mobility  Muscle weakness (generalized)  Unsteadiness on feet  Unspecified lack of coordination     Problem List Patient Active Problem List   Diagnosis Date Noted  . Urinary hesitancy 02/10/2017  . Primary hypercoagulable state ([place]) [D68.59] 02/09/2017  . Encounter for therapeutic drug monitoring 02/09/2017  . CAD  (coronary artery disease) 02/09/2017  . Anticoagulated 02/09/2017    Bess Harvest, PTA 07/30/17 2:44 PM  [facility] [address] [place], [place], [address] Phone: [phone]   Fax:  [phone]  [name] MRN: [id number] Date of Birth: 1946/10/26

## 2017-08-03 ENCOUNTER — Ambulatory Visit: Payer: Medicare Other | Admitting: Rehabilitation

## 2017-08-03 ENCOUNTER — Encounter: Payer: Self-pay | Admitting: Rehabilitation

## 2017-08-03 DIAGNOSIS — R2681 Unsteadiness on feet: Secondary | ICD-10-CM | POA: Diagnosis not present

## 2017-08-03 DIAGNOSIS — R279 Unspecified lack of coordination: Secondary | ICD-10-CM | POA: Diagnosis not present

## 2017-08-03 DIAGNOSIS — M6281 Muscle weakness (generalized): Secondary | ICD-10-CM

## 2017-08-03 DIAGNOSIS — R2689 Other abnormalities of gait and mobility: Secondary | ICD-10-CM

## 2017-08-03 NOTE — Therapy (Signed)
[facility] [address] [place], [place], [address] Phone: [phone]   Fax:  [phone]  Physical Therapy Treatment  Patient Details  [name] MRN: [id number] Date of Birth: 03/15/1946 Referring Provider: Dr. Betty Martinique  Encounter Date: 08/03/2017      PT End of Session - 08/03/17 0928    Visit Number 28   Number of Visits 30   Date for PT Re-Evaluation 08/17/17   Authorization Type  G code; KX MODIFIER NEEDED   PT Start Time 0848   PT Stop Time 0929   PT Time Calculation (min) 41 min   Activity Tolerance Patient tolerated treatment well      Past Medical History:  Diagnosis Date  . Allergy   . Arthritis   . 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 Cataracts, bilateral   . Chicken pox   . Dyslipidemia   . Hyperlipidemia   . Hypertension   . Sinus bradycardia     Past Surgical History:  Procedure Laterality Date  . BYPASS GRAFT  1996  . CATARACT EXTRACTION Right   . REFRACTIVE SURGERY Right   . SKIN BIOPSY    . UMBILICAL HERNIA REPAIR  2003    There were no vitals filed for this visit.      Subjective Assessment - 08/03/17 0850    Subjective Had a good workout.  Having some overall OA pain today   Currently in Pain? No/denies   Aggravating Factors  morning   Pain Relieving Factors ice and heat                         [facility] Adult PT Treatment/Exercise - 08/03/17 0001      Lumbar Exercises: Aerobic   Stationary Bike NuStep L5 x 6 min LEs only with PT verbal cues for quad contraction   Tread Mill 4 min, 1.5 mph; with focus on step length and knee extension     Knee/Hip Exercises: Standing   Heel Raises 20 reps;3 seconds   Hip Abduction Right;Left;10 reps;Knee straight   Abduction Limitations 4#    Hip Extension Right;Left;Knee straight;10 reps   Extension Limitations 4#    Functional Squat 15 reps;3 seconds   Functional Squat Limitations  cues for LE spacing and upright posture   Rebounder march x 60" CGA, weight shift fwd/back x 10 CGA                  PT Short Term Goals - 05/25/17 1025      PT SHORT TERM GOAL #1   Title The patient will express understanding of best assistive device to decrease risk of falls as well as other safety considerations (lighting, throw rugs)  04/28/17   Time 4   Period Weeks   Status Achieved     PT SHORT TERM GOAL #2   Title The patient will have improved Timed up and Go test to 25 sec indicating greater ease with sit to stand   Time 4   Period Weeks   Status Achieved     PT SHORT TERM GOAL #3   Title BERG balance test improved from 29/56 to 33/56 indicating decreased risk of falls   Time 4   Period Weeks   Status Achieved           PT Long Term Goals - 07/20/17 1036      PT LONG TERM GOAL #1  Title The patient will be independent with safe self progession of HEP needed for further improvements in balance and strength  05/26/17   Time 4   Period Weeks   Status On-going   Target Date 08/17/17     PT LONG TERM GOAL #2   Title The patient will have improved LE strength to grossly 4+/5 needed for greater ease with sit to stand and up and down steps   Baseline 4/5   Time 4   Period Weeks   Status Revised   Target Date 08/17/17     PT LONG TERM GOAL #3   Title The patient will have improved Activities-Specific Balance Scale score from 78% to 85% confidence with ADLss   Baseline not tested   Time 4   Period Weeks   Status On-going     PT LONG TERM GOAL #4   Title BERG balance score improved to 50/56 indicating decreased fall risk    Baseline 47/56   Time 4   Period Weeks   Status On-going   Target Date 08/17/17     PT LONG TERM GOAL #5   Title Timed up and Go score improved to 12 sec indicating improved gait speed, mobility with sit to stand and decreased fall risk with a cane   Baseline 14.3   Time 4   Period Weeks   Status On-going   Target Date  08/17/17     PT LONG TERM GOAL #6   Title stand with one foot ahead of the other for 45 seconds due to increased hip strength and balance so he is able to walk through a smaller doorway   Baseline 31 sec   Time 4   Period Weeks   Status Revised               Plan - 08/03/17 0929    Clinical Impression Statement Tolerated all well.  Recovered from skin surgery last week.  able to perform rebounder without hands today with CGA. Continues with fatigue and need for rest   PT Frequency 2x / week   PT Duration 4 weeks   PT Treatment/Interventions ADLs/Self Care Home Management;Therapeutic activities;Therapeutic exercise;Neuromuscular re-education;Stair training;Functional mobility training;Patient/family education;Manual techniques;Gait training   PT Next Visit Plan  balance and LE strength bil      Patient will benefit from skilled therapeutic intervention in order to improve the following deficits and impairments:  Decreased balance, Decreased activity tolerance, Decreased strength, Decreased mobility, Difficulty walking, Postural dysfunction  Visit Diagnosis: Other abnormalities of gait and mobility  Muscle weakness (generalized)  Unsteadiness on feet  Unspecified lack of coordination     Problem List Patient Active Problem List   Diagnosis Date Noted  . Urinary hesitancy 02/10/2017  . Primary hypercoagulable state ([place]) [D68.59] 02/09/2017  . Encounter for therapeutic drug monitoring 02/09/2017  . CAD (coronary artery disease) 02/09/2017  . Anticoagulated 02/09/2017    Stark Bray, DPT, CMP 08/03/2017, 9:31 AM  [facility] [address] [place], [place], [address] Phone: [phone]   Fax:  [phone]  [name] MRN: [id number] Date of Birth: 1946/09/14

## 2017-08-05 ENCOUNTER — Ambulatory Visit: Payer: Medicare Other

## 2017-08-05 DIAGNOSIS — R279 Unspecified lack of coordination: Secondary | ICD-10-CM | POA: Diagnosis not present

## 2017-08-05 DIAGNOSIS — M6281 Muscle weakness (generalized): Secondary | ICD-10-CM

## 2017-08-05 DIAGNOSIS — R2681 Unsteadiness on feet: Secondary | ICD-10-CM

## 2017-08-05 DIAGNOSIS — R2689 Other abnormalities of gait and mobility: Secondary | ICD-10-CM | POA: Diagnosis not present

## 2017-08-05 NOTE — Therapy (Signed)
[facility] [address] [place], [place], [address] Phone: [phone]   Fax:  [phone]  Physical Therapy Treatment  Patient Details  Name: LEANDRO BERKOWITZ MRN: [id number] Date of Birth: [birth year] Referring Provider: Dr. Betty Martinique  Encounter Date: 08/05/2017      PT End of Session - 08/05/17 0937    Visit Number 29   Number of Visits 30   Date for PT Re-Evaluation 08/17/17   Authorization Type  G code; KX MODIFIER NEEDED   PT Start Time 0930   PT Stop Time 1011   PT Time Calculation (min) 41 min   Activity Tolerance Patient tolerated treatment well      Past Medical History:  Diagnosis Date  . Allergy   . Arthritis   . 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 Cataracts, bilateral   . Chicken pox   . Dyslipidemia   . Hyperlipidemia   . Hypertension   . Sinus bradycardia     Past Surgical History:  Procedure Laterality Date  . BYPASS GRAFT  1996  . CATARACT EXTRACTION Right   . REFRACTIVE SURGERY Right   . SKIN BIOPSY    . UMBILICAL HERNIA REPAIR  2003    There were no vitals filed for this visit.      Subjective Assessment - 08/05/17 0935    Subjective Pt. doing well noting continued OA pain however feels he has continued to recover from skin surgery.  Some back pain today due to lifting boxes.     Patient Stated Goals regain balance; less of a hazard to my wife   Currently in Pain? Yes   Pain Score 3    Pain Location Back   Pain Orientation Right;Left   Pain Descriptors / Indicators Sore   Pain Type Acute pain   Pain Frequency Intermittent   Aggravating Factors  lifting   Pain Relieving Factors ice   Multiple Pain Sites No                         [facility] Adult PT Treatment/Exercise - 08/05/17 0944      Neuro Re-ed    Neuro Re-ed Details  Side step, forward step over 3 6" bolsters x 4 laps down back each way; cues to avoid circumduction with SPC       Lumbar Exercises: Aerobic   Stationary Bike Recumbent bike lvl 3, 7 min    Tread Mill 4 min, 1.5 mph; with focus on step length and knee extension  with ball kick      Knee/Hip Exercises: Standing   Heel Raises 20 reps;3 seconds   Hip Flexion Right;Left;Knee straight;10 reps   Hip Flexion Limitations 4#    Hip Abduction Right;Left;10 reps;Knee straight   Abduction Limitations 4#    Hip Extension Right;Left;Knee straight;10 reps   Extension Limitations 4#    Functional Squat 15 reps;3 seconds;2 sets   Functional Squat Limitations cues for LE spacing and upright posture                  PT Short Term Goals - 05/25/17 1025      PT SHORT TERM GOAL #1   Title The patient will express understanding of best assistive device to decrease risk of falls as well as other safety considerations (lighting, throw rugs)  04/28/17   Time 4   Period Weeks   Status Achieved  PT SHORT TERM GOAL #2   Title The patient will have improved Timed up and Go test to 25 sec indicating greater ease with sit to stand   Time 4   Period Weeks   Status Achieved     PT SHORT TERM GOAL #3   Title BERG balance test improved from 29/56 to 33/56 indicating decreased risk of falls   Time 4   Period Weeks   Status Achieved           PT Long Term Goals - 07/20/17 1036      PT LONG TERM GOAL #1   Title The patient will be independent with safe self progession of HEP needed for further improvements in balance and strength  05/26/17   Time 4   Period Weeks   Status On-going   Target Date 08/17/17     PT LONG TERM GOAL #2   Title The patient will have improved LE strength to grossly 4+/5 needed for greater ease with sit to stand and up and down steps   Baseline 4/5   Time 4   Period Weeks   Status Revised   Target Date 08/17/17     PT LONG TERM GOAL #3   Title The patient will have improved Activities-Specific Balance Scale score from 78% to 85% confidence with ADLss   Baseline not  tested   Time 4   Period Weeks   Status On-going     PT LONG TERM GOAL #4   Title BERG balance score improved to 50/56 indicating decreased fall risk    Baseline 47/56   Time 4   Period Weeks   Status On-going   Target Date 08/17/17     PT LONG TERM GOAL #5   Title Timed up and Go score improved to 12 sec indicating improved gait speed, mobility with sit to stand and decreased fall risk with a cane   Baseline 14.3   Time 4   Period Weeks   Status On-going   Target Date 08/17/17     PT LONG TERM GOAL #6   Title stand with one foot ahead of the other for 45 seconds due to increased hip strength and balance so he is able to walk through a smaller doorway   Baseline 31 sec   Time 4   Period Weeks   Status Revised               Plan - 08/05/17 1730    Clinical Impression Statement Pt. doing well today with some back pain due to report of lifting boxes at home.  Tolerated all standing balance and LE strengthening activities well today.  Notes some dizziness today starting treatment which subsided after warm-up.  Seems to be progressing slowly toward goals with downtime due to recent skin surgery slowing progress as of late.     PT Treatment/Interventions ADLs/Self Care Home Management;Therapeutic activities;Therapeutic exercise;Neuromuscular re-education;Stair training;Functional mobility training;Patient/family education;Manual techniques;Gait training   PT Next Visit Plan  balance and LE strength bil      Patient will benefit from skilled therapeutic intervention in order to improve the following deficits and impairments:  Decreased balance, Decreased activity tolerance, Decreased strength, Decreased mobility, Difficulty walking, Postural dysfunction  Visit Diagnosis: Other abnormalities of gait and mobility  Muscle weakness (generalized)  Unsteadiness on feet  Unspecified lack of coordination     Problem List Patient Active Problem List   Diagnosis Date Noted   . Urinary hesitancy 02/10/2017  . Primary  hypercoagulable state ([place]) [D68.59] 02/09/2017  . Encounter for therapeutic drug monitoring 02/09/2017  . CAD (coronary artery disease) 02/09/2017  . Anticoagulated 02/09/2017    Bess Harvest, PTA 08/05/17 5:35 PM  [facility] [address] [place], [place], [address] Phone: [phone]   Fax:  [phone]  Name: GAYLON BENTZ MRN: [id number] Date of Birth: 03-Jul-1946

## 2017-08-07 ENCOUNTER — Ambulatory Visit (INDEPENDENT_AMBULATORY_CARE_PROVIDER_SITE_OTHER): Payer: Medicare Other

## 2017-08-07 DIAGNOSIS — D6859 Other primary thrombophilia: Secondary | ICD-10-CM

## 2017-08-07 DIAGNOSIS — I2510 Atherosclerotic heart disease of native coronary artery without angina pectoris: Secondary | ICD-10-CM

## 2017-08-07 DIAGNOSIS — Z5181 Encounter for therapeutic drug level monitoring: Secondary | ICD-10-CM | POA: Diagnosis not present

## 2017-08-07 LAB — POCT INR: INR: 2.3

## 2017-08-11 DIAGNOSIS — M5134 Other intervertebral disc degeneration, thoracic region: Secondary | ICD-10-CM | POA: Diagnosis not present

## 2017-08-11 DIAGNOSIS — M9905 Segmental and somatic dysfunction of pelvic region: Secondary | ICD-10-CM | POA: Diagnosis not present

## 2017-08-11 DIAGNOSIS — M5136 Other intervertebral disc degeneration, lumbar region: Secondary | ICD-10-CM | POA: Diagnosis not present

## 2017-08-11 DIAGNOSIS — M5431 Sciatica, right side: Secondary | ICD-10-CM | POA: Diagnosis not present

## 2017-08-11 DIAGNOSIS — M9903 Segmental and somatic dysfunction of lumbar region: Secondary | ICD-10-CM | POA: Diagnosis not present

## 2017-08-11 DIAGNOSIS — M9902 Segmental and somatic dysfunction of thoracic region: Secondary | ICD-10-CM | POA: Diagnosis not present

## 2017-08-12 ENCOUNTER — Ambulatory Visit: Payer: Medicare Other | Attending: Family Medicine

## 2017-08-12 DIAGNOSIS — R279 Unspecified lack of coordination: Secondary | ICD-10-CM | POA: Insufficient documentation

## 2017-08-12 DIAGNOSIS — M6281 Muscle weakness (generalized): Secondary | ICD-10-CM

## 2017-08-12 DIAGNOSIS — R2689 Other abnormalities of gait and mobility: Secondary | ICD-10-CM | POA: Diagnosis not present

## 2017-08-12 DIAGNOSIS — R2681 Unsteadiness on feet: Secondary | ICD-10-CM | POA: Diagnosis not present

## 2017-08-12 NOTE — Therapy (Signed)
[facility] [address] [place], [place], [address] Phone: [phone]   Fax:  [phone]  Physical Therapy Treatment  Patient Details  [name] MRN: [id number] Date of Birth: [birth year] Referring Provider: Dr. Betty Martinique  Encounter Date: 08/12/2017      PT End of Session - 08/12/17 1012    Visit Number 30   Number of Visits 40   Date for PT Re-Evaluation 08/17/17   Authorization Type  G code; KX MODIFIER NEEDED   PT Start Time 0931   PT Stop Time 1011   PT Time Calculation (min) 40 min   Activity Tolerance Patient tolerated treatment well   Behavior During Therapy [facility] for tasks assessed/performed      Past Medical History:  Diagnosis Date  . Allergy   . Arthritis   . 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 Cataracts, bilateral   . Chicken pox   . Dyslipidemia   . Hyperlipidemia   . Hypertension   . Sinus bradycardia     Past Surgical History:  Procedure Laterality Date  . BYPASS GRAFT  1996  . CATARACT EXTRACTION Right   . REFRACTIVE SURGERY Right   . SKIN BIOPSY    . UMBILICAL HERNIA REPAIR  2003    There were no vitals filed for this visit.      Subjective Assessment - 08/12/17 0936    Subjective No pain today. I'm feeling really good.  I can walk 1/4 of a mile now.     Currently in Pain? Yes   Pain Score 1    Pain Location Back   Pain Orientation Right;Left   Pain Descriptors / Indicators Sore   Pain Type Acute pain   Pain Onset More than a month ago   Pain Frequency Intermittent   Aggravating Factors  lifting, walking   Pain Relieving Factors ice            [facility] PT Assessment - 08/12/17 0001      Observation/Other Assessments   Activities of Balance Confidence Scale (ABC Scale)  89%     Timed Up and Go Test   TUG Normal TUG   Normal TUG (seconds) 16  with use of cane                     [facility] Adult PT Treatment/Exercise -  08/12/17 0001      Lumbar Exercises: Aerobic   Stationary Bike NuStep: Level 5 x 10 minutes  PT present to discuss progress     Knee/Hip Exercises: Standing   Heel Raises 20 reps;3 seconds   Hip Flexion Right;Left;Knee straight;10 reps   Hip Flexion Limitations 4#    Hip Abduction Right;Left;10 reps;Knee straight   Abduction Limitations 4#    Hip Extension Right;Left;Knee straight;10 reps   Extension Limitations 4#    Functional Squat 15 reps;3 seconds;2 sets   Functional Squat Limitations cues for LE spacing and upright posture                  PT Short Term Goals - 05/25/17 1025      PT SHORT TERM GOAL #1   Title The patient will express understanding of best assistive device to decrease risk of falls as well as other safety considerations (lighting, throw rugs)  04/28/17   Time 4   Period Weeks   Status Achieved     PT SHORT TERM GOAL #2  Title The patient will have improved Timed up and Go test to 25 sec indicating greater ease with sit to stand   Time 4   Period Weeks   Status Achieved     PT SHORT TERM GOAL #3   Title BERG balance test improved from 29/56 to 33/56 indicating decreased risk of falls   Time 4   Period Weeks   Status Achieved           PT Long Term Goals - August 29, 2017 2836      PT LONG TERM GOAL #1   Title The patient will be independent with safe self progession of HEP needed for further improvements in balance and strength  05/26/17   Time 4   Status On-going     PT LONG TERM GOAL #5   Title Timed up and Go score improved to 12 sec indicating improved gait speed, mobility with sit to stand and decreased fall risk with a cane   Baseline 16 seconds   Status Not Met               Plan - Aug 29, 2017 0950    Clinical Impression Statement Pt continues to demonstrate wide base of support and slow mobility with all transitions and movement.  Pt reports that he is now able to walk 1/4 mile with minimal use of his cane.  Pt performed  TUG in 16 seconds and scored  89% on ABC scale today.  Pt will attend 1 more session and will be discharged to HEP.   Rehab Potential Good   PT Frequency 2x / week   PT Duration 4 weeks   PT Treatment/Interventions ADLs/Self Care Home Management;Therapeutic activities;Therapeutic exercise;Neuromuscular re-education;Stair training;Functional mobility training;Patient/family education;Manual techniques;Gait training   PT Next Visit Plan g-codes next, retest Berg and LE strength to assess LTGs   Consulted and Agree with Plan of Care Patient      Patient will benefit from skilled therapeutic intervention in order to improve the following deficits and impairments:  Decreased balance, Decreased activity tolerance, Decreased strength, Decreased mobility, Difficulty walking, Postural dysfunction  Visit Diagnosis: Other abnormalities of gait and mobility  Muscle weakness (generalized)  Unsteadiness on feet       G-Codes - 2017-08-29 0959    Functional Assessment Tool Used (Outpatient Only) TUG, ABC and clinical judgement   Functional Limitation Mobility: Walking and moving around   Mobility: Walking and Moving Around Current Status [phone]) At least 1 percent but less than 20 percent impaired, limited or restricted   Mobility: Walking and Moving Around Goal Status [phone]) At least 1 percent but less than 20 percent impaired, limited or restricted      Problem List Patient Active Problem List   Diagnosis Date Noted  . Urinary hesitancy 02/10/2017  . Primary hypercoagulable state ([place]) [D68.59] 02/09/2017  . Encounter for therapeutic drug monitoring 02/09/2017  . CAD (coronary artery disease) 02/09/2017  . Anticoagulated 02/09/2017    [name], PT 08-29-2017 10:14 AM   [facility] [address], [place], [place], [address] Phone: [phone]   Fax:  [phone]  [name] MRN: [id number] Date of Birth:  28-Aug-1946

## 2017-08-14 ENCOUNTER — Ambulatory Visit: Payer: Medicare Other | Admitting: Physical Therapy

## 2017-08-14 DIAGNOSIS — R279 Unspecified lack of coordination: Secondary | ICD-10-CM | POA: Diagnosis not present

## 2017-08-14 DIAGNOSIS — R2681 Unsteadiness on feet: Secondary | ICD-10-CM | POA: Diagnosis not present

## 2017-08-14 DIAGNOSIS — M6281 Muscle weakness (generalized): Secondary | ICD-10-CM | POA: Diagnosis not present

## 2017-08-14 DIAGNOSIS — R2689 Other abnormalities of gait and mobility: Secondary | ICD-10-CM
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
## 2017-08-14 NOTE — Therapy (Signed)
[facility] [address] [place], [place], [address] Phone: [phone]   Fax:  [phone]  Physical Therapy Treatment/Discharge  Patient Details  [name] MRN: [id number] Date of Birth: 07-Jul-1946 Referring Provider: Betty Martinique, MD   Encounter Date: 08/14/2017      PT End of Session - 08/14/17 1005    Visit Number 31   Number of Visits 40   Date for PT Re-Evaluation 08/17/17   Authorization Type  G code; KX MODIFIER NEEDED   PT Start Time 0931   PT Stop Time 1015   PT Time Calculation (min) 44 min   Activity Tolerance Patient tolerated treatment well;No increased pain   Behavior During Therapy WFL for tasks assessed/performed      Past Medical History:  Diagnosis Date  . Allergy   . Arthritis   . 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 Cataracts, bilateral   . Chicken pox   . Dyslipidemia   . Hyperlipidemia   . Hypertension   . Sinus bradycardia     Past Surgical History:  Procedure Laterality Date  . BYPASS GRAFT  1996  . CATARACT EXTRACTION Right   . REFRACTIVE SURGERY Right   . SKIN BIOPSY    . UMBILICAL HERNIA REPAIR  2003    There were no vitals filed for this visit.      Subjective Assessment - 08/14/17 0935    Subjective Pt reports that he is keeping up with his exercises at home, and he is trying to walk about .25 miles with his cane. He feels that his strength and balance have improved significantly.    Pertinent History heart bypass surgery 20 years ago;  hasn't driven since 1583   How long can you walk comfortably? up to 1/4 mile   Diagnostic tests MRI head in 2 weeks;  MRI thoracic to lumbar in past shows scoliosis and arthritis;  very low Vit D then too much; hi calcium   Pain Onset More than a month ago            [facility] PT Assessment - 08/14/17 0001      Assessment   Medical Diagnosis unstable gait   Referring Provider Betty Martinique, MD     Prior Therapy sees chiro 2x/week for traction lumbar, massage and spinal manipulation seems to be helping     Precautions   Precautions Fall     Restrictions   Weight Bearing Restrictions No     Balance Screen   Has the patient fallen in the past 6 months No   Has the patient had a decrease in activity level because of a fear of falling?  Yes   Is the patient reluctant to leave their home because of a fear of falling?  No     Home Ecologist residence     Prior Function   Level of Independence Independent     Cognition   Overall Cognitive Status Within Functional Limits for tasks assessed     Observation/Other Assessments   Activities of Balance Confidence Scale (ABC Scale)  89%     Posture/Postural Control   Posture/Postural Control Postural limitations   Postural Limitations Rounded Shoulders;Forward head;Increased thoracic kyphosis     ROM / Strength   AROM / PROM / Strength Strength     Strength   Overall Strength Comments LE strength 5/5 MMT   Right/Left Hip --  Transfers   Transfers --   Five time sit to stand comments  16.9 sec     Ambulation/Gait   Ambulation/Gait Yes   Ambulation/Gait Assistance 6: Modified independent (Device/Increase time)   Assistive device Straight cane   Gait Pattern --  wide base of support     Berg Balance Test   Sit to Stand Able to stand without using hands and stabilize independently   Standing Unsupported Able to stand safely 2 minutes   Sitting with Back Unsupported but Feet Supported on Floor or Stool Able to sit safely and securely 2 minutes   Stand to Sit Sits safely with minimal use of hands   Transfers Able to transfer safely, minor use of hands   Standing Unsupported with Eyes Closed Able to stand 10 seconds safely   Standing Ubsupported with Feet Together Able to place feet together independently and stand for 1 minute with supervision   From Standing, Reach Forward with Outstretched  Arm Can reach confidently >25 cm (10")   From Standing Position, Pick up Object from Floor Able to pick up shoe safely and easily   From Standing Position, Turn to Look Behind Over each Shoulder Looks behind from both sides and weight shifts well   Turn 360 Degrees Able to turn 360 degrees safely but slowly  in 7-8 sec    Standing Unsupported, Alternately Place Feet on Step/Stool Able to stand independently and safely and complete 8 steps in 20 seconds   Standing Unsupported, One Foot in Front Able to plae foot ahead of the other independently and hold 30 seconds  LLE forward    Standing on One Leg Able to lift leg independently and hold 5-10 seconds  Rt 10-15 sec, Lt 2-3 sec    Total Score 51   Berg comment: moderate risk of falling      Timed Up and Go Test   TUG Normal TUG   Normal TUG (seconds) 14  used cane                      [facility] Adult PT Treatment/Exercise - 08/14/17 0001      Lumbar Exercises: Aerobic   Stationary Bike Nustep L5 x6 min  PT present to gradually increase resistance and discuss goals met, etc.                 PT Education - 08/14/17 1012    Education provided Yes   Education Details discussed results of Berg balance and other estabished goals; reviewed HEP and discussed ways to progress pt's HEP and walking for further improvements in his strength and endurance; discussed benefits of joining a local gym to improve fitness    Person(s) Educated Patient   Methods Explanation   Comprehension Verbalized understanding          PT Short Term Goals - 08/14/17 0956      PT SHORT TERM GOAL #1   Title The patient will express understanding of best assistive device to decrease risk of falls as well as other safety considerations (lighting, throw rugs)  04/28/17   Time 4   Period Weeks   Status Achieved     PT SHORT TERM GOAL #2   Title The patient will have improved Timed up and Go test to 25 sec indicating greater ease with sit to  stand   Time 4   Period Weeks   Status Achieved     PT SHORT TERM GOAL #3  Title BERG balance test improved from 29/56 to 33/56 indicating decreased risk of falls   Baseline 51/56   Time 4   Period Weeks   Status Achieved           PT Long Term Goals - August 26, 2017 0957      PT LONG TERM GOAL #1   Title The patient will be independent with safe self progession of HEP needed for further improvements in balance and strength  05/26/17   Time 4   Status Achieved     PT LONG TERM GOAL #2   Title The patient will have improved LE strength to grossly 4+/5 needed for greater ease with sit to stand and up and down steps   Baseline 5/5   Time 4   Period Weeks   Status Achieved     PT LONG TERM GOAL #3   Title The patient will have improved Activities-Specific Balance Scale score from 78% to 85% confidence with ADLss   Baseline not tested   Time 4   Period Weeks   Status Achieved     PT LONG TERM GOAL #4   Title BERG balance score improved to 50/56 indicating decreased fall risk    Baseline 51/56   Period Weeks   Status Achieved   Target Date 08/17/17     PT LONG TERM GOAL #5   Title Timed up and Go score improved to 12 sec indicating improved gait speed, mobility with sit to stand and decreased fall risk with a cane   Baseline 16 seconds   Status Not Met               Plan - 08/26/2017 1126    Clinical Impression Statement Pt was discharged this visit having met all of his short and long term established goals. He scored 51/56 on the Berg balance test, his LE strength is grossly 5/5 MMT, and he performed well with functional testing. He has been working consistently on his HEP and attempting to walk more during the week, which he reports is now up to 1/4 of a mile. At this time, he is agreeable with discharge and feels comfortable completing his advanced HEP and other activities at home to further progress his balance, strength and endurance. Therapist reviewed  recommendations for HEP adjustments and progressions and pt verbalized understanding of this.    Rehab Potential Good   PT Frequency 2x / week   PT Duration 4 weeks   PT Treatment/Interventions ADLs/Self Care Home Management;Therapeutic activities;Therapeutic exercise;Neuromuscular re-education;Stair training;Functional mobility training;Patient/family education;Manual techniques;Gait training   PT Next Visit Plan d/c home    Consulted and Agree with Plan of Care Patient      Patient will benefit from skilled therapeutic intervention in order to improve the following deficits and impairments:  Decreased balance, Decreased activity tolerance, Decreased strength, Decreased mobility, Difficulty walking, Postural dysfunction  Visit Diagnosis: Other abnormalities of gait and mobility  Muscle weakness (generalized)  Unsteadiness on feet  Unspecified lack of coordination       G-Codes - August 26, 2017 1130    Functional Assessment Tool Used (Outpatient Only) TUG, ABC and clinical judgement   Functional Limitation Mobility: Walking and moving around   Mobility: Walking and Moving Around Goal Status [phone]) At least 1 percent but less than 20 percent impaired, limited or restricted   Mobility: Walking and Moving Around Discharge Status [phone]) At least 1 percent but less than 20 percent impaired, limited or restricted  Problem List Patient Active Problem List   Diagnosis Date Noted  . Urinary hesitancy 02/10/2017  . Primary hypercoagulable state ([place]) [D68.59] 02/09/2017  . Encounter for therapeutic drug monitoring 02/09/2017  . CAD (coronary artery disease) 02/09/2017  . Anticoagulated 02/09/2017     PHYSICAL THERAPY DISCHARGE SUMMARY  Visits from Start of Care: 31  Current functional level related to goals / functional outcomes: See above for more details    Remaining deficits: See above for more details    Education / Equipment: See above for more  details  Plan: Patient agrees to discharge.  Patient goals were met. Patient is being discharged due to meeting the stated rehab goals.  ?????     11:34 AM,08/14/17 Elly Modena PT, [facility] [address] [place], [place], [address] Phone: [phone]   Fax:  [phone]  Name: FERLANDO LIA MRN: [id number] Date of Birth: 1946-10-12

## 2017-08-17 ENCOUNTER — Encounter: Payer: Self-pay | Admitting: Neurology

## 2017-08-17 ENCOUNTER — Ambulatory Visit (INDEPENDENT_AMBULATORY_CARE_PROVIDER_SITE_OTHER): Payer: Medicare Other | Admitting: Neurology

## 2017-08-17 VITALS — BP 150/84 | HR 70 | Ht 75.0 in | Wt 201.9 lb

## 2017-08-17 DIAGNOSIS — G609 Hereditary and idiopathic neuropathy, unspecified: Secondary | ICD-10-CM | POA: Diagnosis not present

## 2017-08-17 DIAGNOSIS — I2510 Atherosclerotic heart disease of native coronary artery without angina pectoris: Secondary | ICD-10-CM | POA: Diagnosis not present

## 2017-08-17 DIAGNOSIS — G603 Idiopathic progressive neuropathy: Secondary | ICD-10-CM | POA: Diagnosis not present

## 2017-08-17 DIAGNOSIS — M62838 Other muscle spasm: Secondary | ICD-10-CM | POA: Diagnosis not present

## 2017-08-17 DIAGNOSIS — M546 Pain in thoracic spine: Secondary | ICD-10-CM | POA: Diagnosis not present

## 2017-08-17 DIAGNOSIS — R2681 Unsteadiness on feet: Secondary | ICD-10-CM | POA: Diagnosis not present

## 2017-08-17 DIAGNOSIS — R200 Anesthesia of skin: Secondary | ICD-10-CM | POA: Diagnosis not present

## 2017-08-17 NOTE — Patient Instructions (Signed)
Continue home exercises and walking Follow up as needed.
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
## 2017-08-18 ENCOUNTER — Encounter: Payer: Medicare Other | Admitting: Physical Therapy

## 2017-08-19 DIAGNOSIS — M5431 Sciatica, right side: Secondary | ICD-10-CM | POA: Diagnosis not present

## 2017-08-19 DIAGNOSIS — M9905 Segmental and somatic dysfunction of pelvic region: Secondary | ICD-10-CM | POA: Diagnosis not present

## 2017-08-19 DIAGNOSIS — M5136 Other intervertebral disc degeneration, lumbar region: Secondary | ICD-10-CM | POA: Diagnosis not present

## 2017-08-19 DIAGNOSIS — M5134 Other intervertebral disc degeneration, thoracic region: Secondary | ICD-10-CM | POA: Diagnosis not present

## 2017-08-19 DIAGNOSIS — M9903 Segmental and somatic dysfunction of lumbar region: Secondary | ICD-10-CM | POA: Diagnosis not present

## 2017-08-19 DIAGNOSIS — M9902 Segmental and somatic dysfunction of thoracic region: Secondary | ICD-10-CM | POA: Diagnosis not present

## 2017-08-20 ENCOUNTER — Encounter: Payer: Medicare Other | Admitting: Physical Therapy

## 2017-08-24 ENCOUNTER — Encounter: Payer: Medicare Other | Admitting: Physical Therapy

## 2017-08-26 ENCOUNTER — Encounter: Payer: Medicare Other | Admitting: Physical Therapy

## 2017-08-26 DIAGNOSIS — M9902 Segmental and somatic dysfunction of thoracic region: Secondary | ICD-10-CM | POA: Diagnosis not present

## 2017-08-26 DIAGNOSIS — M9903 Segmental and somatic dysfunction of lumbar region: Secondary | ICD-10-CM | POA: Diagnosis not present

## 2017-08-26 DIAGNOSIS — M5134 Other intervertebral disc degeneration, thoracic region: Secondary | ICD-10-CM | POA: Diagnosis not present

## 2017-08-26 DIAGNOSIS — M5431 Sciatica, right side: Secondary | ICD-10-CM | POA: Diagnosis not present

## 2017-08-26 DIAGNOSIS — M5136 Other intervertebral disc degeneration, lumbar region: Secondary | ICD-10-CM | POA: Diagnosis not present

## 2017-08-26 DIAGNOSIS — M9905 Segmental and somatic dysfunction of pelvic region: Secondary | ICD-10-CM | POA: Diagnosis not present

## 2017-08-27 ENCOUNTER — Encounter: Payer: Self-pay | Admitting: Family Medicine

## 2017-08-31 ENCOUNTER — Encounter: Payer: Medicare Other | Admitting: Physical Therapy

## 2017-09-02 ENCOUNTER — Encounter: Payer: Medicare Other | Admitting: Physical Therapy

## 2017-09-02 DIAGNOSIS — M9905 Segmental and somatic dysfunction of pelvic region: Secondary | ICD-10-CM | POA: Diagnosis not present

## 2017-09-02 DIAGNOSIS — M9902 Segmental and somatic dysfunction of thoracic region: Secondary | ICD-10-CM | POA: Diagnosis not present

## 2017-09-02 DIAGNOSIS — M5431 Sciatica, right side: Secondary | ICD-10-CM | POA: Diagnosis not present

## 2017-09-02 DIAGNOSIS — M5134 Other intervertebral disc degeneration, thoracic region: Secondary | ICD-10-CM | POA: Diagnosis not present

## 2017-09-02 DIAGNOSIS — M5136 Other intervertebral disc degeneration, lumbar region: Secondary | ICD-10-CM | POA: Diagnosis not present

## 2017-09-02 DIAGNOSIS — M9903 Segmental and somatic dysfunction of lumbar region: Secondary | ICD-10-CM | POA: Diagnosis not present

## 2017-09-04 ENCOUNTER — Ambulatory Visit (INDEPENDENT_AMBULATORY_CARE_PROVIDER_SITE_OTHER): Payer: Medicare Other | Admitting: *Deleted

## 2017-09-04 DIAGNOSIS — D6859 Other primary thrombophilia: Secondary | ICD-10-CM | POA: Diagnosis not present

## 2017-09-04 DIAGNOSIS — Z5181 Encounter for therapeutic drug level monitoring: Secondary | ICD-10-CM

## 2017-09-04 LAB — POCT INR: INR: 3.4

## 2017-09-09 DIAGNOSIS — M9903 Segmental and somatic dysfunction of lumbar region: Secondary | ICD-10-CM | POA: Diagnosis not present

## 2017-09-09 DIAGNOSIS — M5136 Other intervertebral disc degeneration, lumbar region: Secondary | ICD-10-CM | POA: Diagnosis not present

## 2017-09-09 DIAGNOSIS — M5134 Other intervertebral disc degeneration, thoracic region: Secondary | ICD-10-CM | POA: Diagnosis not present

## 2017-09-09 DIAGNOSIS — M9902 Segmental and somatic dysfunction of thoracic region: Secondary | ICD-10-CM | POA: Diagnosis not present

## 2017-09-09 DIAGNOSIS — M9905 Segmental and somatic dysfunction of pelvic region: Secondary | ICD-10-CM | POA: Diagnosis not present

## 2017-09-09 DIAGNOSIS — M5431 Sciatica, right side: Secondary | ICD-10-CM | POA: Diagnosis not present

## 2017-09-10 ENCOUNTER — Ambulatory Visit: Payer: Medicare Other | Admitting: Neurology

## 2017-09-16 DIAGNOSIS — G629 Polyneuropathy, unspecified: Secondary | ICD-10-CM | POA: Diagnosis not present

## 2017-09-16 DIAGNOSIS — R7302 Impaired glucose tolerance (oral): Secondary | ICD-10-CM | POA: Diagnosis not present

## 2017-09-16 DIAGNOSIS — R200 Anesthesia of skin: Secondary | ICD-10-CM | POA: Diagnosis not present

## 2017-09-16 DIAGNOSIS — Z87891 Personal history of nicotine dependence: Secondary | ICD-10-CM | POA: Diagnosis not present

## 2017-09-16 DIAGNOSIS — G603 Idiopathic progressive neuropathy: Secondary | ICD-10-CM | POA: Diagnosis not present

## 2017-09-16 DIAGNOSIS — R2681 Unsteadiness on feet: Secondary | ICD-10-CM | POA: Diagnosis not present

## 2017-09-16 DIAGNOSIS — R269 Unspecified abnormalities of gait and mobility: Secondary | ICD-10-CM | POA: Diagnosis not present

## 2017-09-16 DIAGNOSIS — I10 Essential (primary) hypertension: Secondary | ICD-10-CM | POA: Diagnosis not present

## 2017-09-16 DIAGNOSIS — M5030 Other cervical disc degeneration, unspecified cervical region: Secondary | ICD-10-CM | POA: Diagnosis not present

## 2017-09-16 DIAGNOSIS — E672 Megavitamin-B6 syndrome: Secondary | ICD-10-CM | POA: Diagnosis not present

## 2017-09-17 DIAGNOSIS — M9902 Segmental and somatic dysfunction of thoracic region: Secondary | ICD-10-CM | POA: Diagnosis not present

## 2017-09-17 DIAGNOSIS — M9903 Segmental and somatic dysfunction of lumbar region: Secondary | ICD-10-CM | POA: Diagnosis not present

## 2017-09-17 DIAGNOSIS — M5134 Other intervertebral disc degeneration, thoracic region: Secondary | ICD-10-CM | POA: Diagnosis not present

## 2017-09-17 DIAGNOSIS — M5136 Other intervertebral disc degeneration, lumbar region: Secondary | ICD-10-CM | POA: Diagnosis not present

## 2017-09-17 DIAGNOSIS — M9905 Segmental and somatic dysfunction of pelvic region: Secondary | ICD-10-CM | POA: Diagnosis not present

## 2017-09-17 DIAGNOSIS — M5431 Sciatica, right side: Secondary | ICD-10-CM | POA: Diagnosis not present

## 2017-09-21 ENCOUNTER — Ambulatory Visit (INDEPENDENT_AMBULATORY_CARE_PROVIDER_SITE_OTHER): Payer: Medicare Other | Admitting: Pharmacist Clinician (PhC)/ Clinical Pharmacy Specialist

## 2017-09-21 DIAGNOSIS — D6859 Other primary thrombophilia: Secondary | ICD-10-CM

## 2017-09-21 DIAGNOSIS — Z5181 Encounter for therapeutic drug level monitoring: Secondary | ICD-10-CM | POA: Diagnosis not present

## 2017-09-21 LAB — POCT INR: INR: 2.3

## 2017-09-23 DIAGNOSIS — M5431 Sciatica, right side: Secondary | ICD-10-CM | POA: Diagnosis not present

## 2017-09-23 DIAGNOSIS — M9905 Segmental and somatic dysfunction of pelvic region: Secondary | ICD-10-CM | POA: Diagnosis not present

## 2017-09-23 DIAGNOSIS — M9903 Segmental and somatic dysfunction of lumbar region: Secondary | ICD-10-CM | POA: Diagnosis not present

## 2017-09-23 DIAGNOSIS — M5136 Other intervertebral disc degeneration, lumbar region: Secondary | ICD-10-CM | POA: Diagnosis not present

## 2017-09-23 DIAGNOSIS — M9902 Segmental and somatic dysfunction of thoracic region: Secondary | ICD-10-CM | POA: Diagnosis not present

## 2017-09-23 DIAGNOSIS — M5134 Other intervertebral disc degeneration, thoracic region: Secondary | ICD-10-CM | POA: Diagnosis not present

## 2017-10-07 DIAGNOSIS — M9903 Segmental and somatic dysfunction of lumbar region: Secondary | ICD-10-CM | POA: Diagnosis not present

## 2017-10-07 DIAGNOSIS — M9902 Segmental and somatic dysfunction of thoracic region: Secondary | ICD-10-CM | POA: Diagnosis not present

## 2017-10-07 DIAGNOSIS — M5134 Other intervertebral disc degeneration, thoracic region: Secondary | ICD-10-CM | POA: Diagnosis not present

## 2017-10-07 DIAGNOSIS — M5136 Other intervertebral disc degeneration, lumbar region: Secondary | ICD-10-CM | POA: Diagnosis not present

## 2017-10-07 DIAGNOSIS — M5431 Sciatica, right side: Secondary | ICD-10-CM | POA: Diagnosis not present

## 2017-10-07 DIAGNOSIS — M9905 Segmental and somatic dysfunction of pelvic region: Secondary | ICD-10-CM | POA: Diagnosis not present

## 2017-10-14 DIAGNOSIS — M9902 Segmental and somatic dysfunction of thoracic region: Secondary | ICD-10-CM | POA: Diagnosis not present

## 2017-10-14 DIAGNOSIS — M9903 Segmental and somatic dysfunction of lumbar region: Secondary | ICD-10-CM | POA: Diagnosis not present

## 2017-10-14 DIAGNOSIS — M9905 Segmental and somatic dysfunction of pelvic region: Secondary | ICD-10-CM | POA: Diagnosis not present

## 2017-10-14 DIAGNOSIS — M5136 Other intervertebral disc degeneration, lumbar region: Secondary | ICD-10-CM | POA: Diagnosis not present

## 2017-10-14 DIAGNOSIS — M5431 Sciatica, right side: Secondary | ICD-10-CM | POA: Diagnosis not present

## 2017-10-14 DIAGNOSIS — M5134 Other intervertebral disc degeneration, thoracic region: Secondary | ICD-10-CM | POA: Diagnosis not present

## 2017-10-16 ENCOUNTER — Ambulatory Visit (INDEPENDENT_AMBULATORY_CARE_PROVIDER_SITE_OTHER): Payer: Medicare Other

## 2017-10-16 DIAGNOSIS — Z23 Encounter for immunization: Secondary | ICD-10-CM

## 2017-10-19 ENCOUNTER — Ambulatory Visit (INDEPENDENT_AMBULATORY_CARE_PROVIDER_SITE_OTHER): Payer: Medicare Other | Admitting: *Deleted

## 2017-10-19 DIAGNOSIS — Z5181 Encounter for therapeutic drug level monitoring: Secondary | ICD-10-CM | POA: Diagnosis not present

## 2017-10-19 DIAGNOSIS — D6859 Other primary thrombophilia: Secondary | ICD-10-CM

## 2017-10-19 LAB — POCT INR: INR: 3.6

## 2017-10-19 NOTE — Progress Notes (Signed)
Do not  take coumadin today Nov 12th then continue with  1 tablet daily.  Recheck in 2 weeks.  Call us with any new medications or concerns [phone] [facility], Main # [phone].

## 2017-10-21 DIAGNOSIS — M9903 Segmental and somatic dysfunction of lumbar region: Secondary | ICD-10-CM | POA: Diagnosis not present

## 2017-10-21 DIAGNOSIS — M5136 Other intervertebral disc degeneration, lumbar region: Secondary | ICD-10-CM | POA: Diagnosis not present

## 2017-10-21 DIAGNOSIS — M5431 Sciatica, right side: Secondary | ICD-10-CM | POA: Diagnosis not present

## 2017-10-21 DIAGNOSIS — M5134 Other intervertebral disc degeneration, thoracic region: Secondary | ICD-10-CM | POA: Diagnosis not present

## 2017-10-21 DIAGNOSIS — M9905 Segmental and somatic dysfunction of pelvic region: Secondary | ICD-10-CM | POA: Diagnosis not present

## 2017-10-21 DIAGNOSIS — M9902 Segmental and somatic dysfunction of thoracic region: Secondary | ICD-10-CM | POA: Diagnosis not present

## 2017-10-21 NOTE — Progress Notes (Signed)
Cardiology Office Note   Date:  10/22/2017   ID:  Timothy, Lloyd [birth year], MRN [id number]  PCP:  Martinique, Betty G, MD    No chief complaint on file. CAD   Wt Readings from Last 3 Encounters:  10/22/17 208 lb 12.8 oz (94.7 kg)  08/17/17 201 lb 14.4 oz (91.6 kg)  05/29/17 197 lb (89.4 kg)       History of Present Illness: [name] is a 71 y.o. male  who had CABG in 1996.  He was being treated for HTN at the time.  He was on vacation and was cleaning and felt severe chest pain with activity.  He failed an ETT.  He had a cath showing 3 vessel disease and had CABG x 3 in 1996 ( LIMA was used.).  Since then, he has not had a cath.  He continued rehab for an extended period of time.  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  He moved to [place] in Jan 2018 from [place].   He uses a cane and walker to walk due to instability.  No recent falls.   Quit smoking 35 years ago.    He was diagnosed with a hypercoagulable state at the time of CABG and he has been on Coumadin since that time.  Nose bleed when INR increased.   He was found to have a markedly elevated fibrinopeptide A level, mild resistance to activated protein C and relative aspirin resistance with respect to ADP-induced platelet aggregation.  Since the last visit, he finished PT to help improve balance.  He continues to do the exercises.   Denies : Chest pain. Dizziness. Leg edema. Nitroglycerin use. Orthopnea. Palpitations. Paroxysmal nocturnal dyspnea. Shortness of breath. Syncope.   He walks some in the better weather.    He was seen at [facility] for his neuropathy.  He has DJD of the spine.  He goes to the chiropracter.       Past Medical History:  Diagnosis Date  . Allergy   . Arthritis   . 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 Cataracts, bilateral   . Chicken pox   . Dyslipidemia   . Hyperlipidemia   . Hypertension   .  Sinus bradycardia     Past Surgical History:  Procedure Laterality Date  . BYPASS GRAFT  1996  . CATARACT EXTRACTION Right   . REFRACTIVE SURGERY Right   . SKIN BIOPSY    . UMBILICAL HERNIA REPAIR  2003     Current Outpatient Medications  Medication Sig Dispense Refill  . acetaminophen (TYLENOL) 500 MG tablet Take 500 mg as needed by mouth.    Marland Kitchen amLODipine (NORVASC) 5 MG tablet Take 5 mg by mouth daily.    Marland Kitchen ezetimibe (ZETIA) 10 MG tablet Take 10 mg by mouth at bedtime.    . fenofibrate (TRICOR) 145 MG tablet Take 145 mg by mouth at bedtime.    Marland Kitchen losartan (COZAAR) 100 MG tablet Take 100 mg by mouth daily.    . rosuvastatin (CRESTOR) 40 MG tablet Take 1 tablet (40 mg total) by mouth at bedtime. 90 tablet 3  . warfarin (COUMADIN) 5 MG tablet Take 5 mg by mouth daily at 6 PM.     No current facility-administered medications for this visit.     Allergies:   Patient has no known allergies.    Social History:  The patient  reports that  he quit smoking about 35 years ago. he has never used smokeless tobacco. He reports that he drinks alcohol. He reports that he does not use drugs.   Family History:  The patient's family history includes AAA (abdominal aortic aneurysm) in his sister; Alzheimer's disease in his mother; Cancer in his father; Hypertension in his maternal grandmother and paternal grandmother; Prostate cancer in his father.    ROS:  Please see the history of present illness.   Otherwise, review of systems are positive for back pain, decreased balance.   All other systems are reviewed and negative.    PHYSICAL EXAM: VS:  BP (!) 144/80   Pulse 80   Ht 6\' 3"  (1.905 m)   Wt 208 lb 12.8 oz (94.7 kg)   SpO2 97%   BMI 26.10 kg/m  , BMI Body mass index is 26.1 kg/m. GEN: Well nourished, well developed, in no acute distress  HEENT: scar on forehead Neck: no JVD, carotid bruits, or masses Cardiac: RRR; no murmurs, rubs, or gallops,no edema  Respiratory:  clear to  auscultation bilaterally, normal work of breathing GI: soft, nontender, nondistended, + BS MS: no deformity or atrophy  Skin: warm and dry, no rash Neuro:  Strength and sensation are intact; walks with a cane Psych: euthymic mood, full affect    Recent Labs: 02/10/2017: ALT 21; BUN 11; Creatinine, Ser 0.93; Hemoglobin 14.3; Platelets 220; Potassium 4.4; Sodium 145 07/21/2017: TSH 1.05   Lipid Panel    Component Value Date/Time   CHOL 118 02/10/2017 0835   TRIG 84 02/10/2017 0835   HDL 56 02/10/2017 0835   CHOLHDL 2.1 02/10/2017 0835   LDLCALC 45 02/10/2017 0835     Other studies Reviewed: Additional studies/ records that were reviewed today with results demonstrating: labs reviewed .   ASSESSMENT AND PLAN:  1. CAD : No angina on current meds.  Continue medical therapy.  CABG in 1996. Rx for SL NTG given just in case of problems.  2. Hyperlipidemia: LDL 45 in 3/18. Continue lipid lowering therapy.   3. Anticoagulated with Coumadin:  Plan for lifetime anticoagulation due to hypercoaguable state. Rare nose bleeds. 4. HTN: Check BP at home. May be lower than it is in the MDs office.  Continue current meds.   Current medicines are reviewed at length with the patient today.  The patient concerns regarding his medicines were addressed.  The following changes have been made:  No change.  Will renew Coumadin prescription for 90 days  Labs/ tests ordered today include:  No orders of the defined types were placed in this encounter.   Recommend 150 minutes/week of aerobic exercise Low fat, low carb, high fiber diet recommended  Disposition:   FU in 1 year   Signed, Larae Grooms, MD  10/22/2017 9:38 AM    [facility] [address], [place], [place]  [address] Phone: [phone]; Fax: [phone]

## 2017-10-22 ENCOUNTER — Encounter: Payer: Self-pay | Admitting: Interventional Cardiology

## 2017-10-22 ENCOUNTER — Ambulatory Visit (INDEPENDENT_AMBULATORY_CARE_PROVIDER_SITE_OTHER): Payer: Medicare Other | Admitting: Interventional Cardiology

## 2017-10-22 VITALS — BP 144/80 | HR 80 | Ht 75.0 in | Wt 208.8 lb

## 2017-10-22 DIAGNOSIS — I10 Essential (primary) hypertension: Secondary | ICD-10-CM

## 2017-10-22 DIAGNOSIS — I25119 Atherosclerotic heart disease of native coronary artery with unspecified angina pectoris: Secondary | ICD-10-CM

## 2017-10-22 DIAGNOSIS — Z7901 Long term (current) use of anticoagulants: Secondary | ICD-10-CM | POA: Diagnosis not present

## 2017-10-22 DIAGNOSIS — I209 Angina pectoris, unspecified: Secondary | ICD-10-CM

## 2017-10-22 DIAGNOSIS — D6859 Other primary thrombophilia: Secondary | ICD-10-CM

## 2017-10-22 DIAGNOSIS — E782 Mixed hyperlipidemia: Secondary | ICD-10-CM

## 2017-10-22 DIAGNOSIS — E785 Hyperlipidemia, unspecified: Secondary | ICD-10-CM | POA: Insufficient documentation

## 2017-10-22 MED ORDER — NITROGLYCERIN 0.4 MG SL SUBL: 0.4000 mg | SUBLINGUAL_TABLET | SUBLINGUAL | 3 refills | Status: DC | PRN

## 2017-10-22 MED ORDER — WARFARIN SODIUM 5 MG PO TABS: 5.0000 mg | ORAL_TABLET | Freq: Every day | ORAL | 1 refills | Status: DC

## 2017-10-22 NOTE — Patient Instructions (Signed)
Medication Instructions:  Your physician recommends that you continue on your current medications as directed. Please refer to the Current Medication list given to you today.  Prescription given for Sublingual Nitroglycerin: Take as directed  90 day supply of Coumadin sent in: Confirmed with [facility]   Labwork: None ordered  Testing/Procedures: None ordered  Follow-Up: Your physician wants you to follow-up in: 1 year with Dr. Irish Lack. You will receive a reminder letter in the mail two months in advance. If you don't receive a letter, please call our office to schedule the follow-up appointment.   Any Other Special Instructions Will Be Listed Below (If Applicable).  Continue to check your Blood Pressure at Home.  Nitroglycerin sublingual tablets What is this medicine? NITROGLYCERIN (nye troe GLI ser in) is a type of vasodilator. It relaxes blood vessels, increasing the blood and oxygen supply to your heart. This medicine is used to relieve chest pain caused by angina. It is also used to prevent chest pain before activities like climbing stairs, going outdoors in cold weather, or sexual activity. This medicine may be used for other purposes; ask your health care provider or pharmacist if you have questions. COMMON BRAND NAME(S): Nitroquick, Nitrostat, Nitrotab What should I tell my health care provider before I take this medicine? They need to know if you have any of these conditions: -anemia -head injury, recent stroke, or bleeding in the brain -liver disease -previous heart attack -an unusual or allergic reaction to nitroglycerin, other medicines, foods, dyes, or preservatives -pregnant or trying to get pregnant -breast-feeding How should I use this medicine? Take this medicine by mouth as needed. At the first sign of an angina attack (chest pain or tightness) place one tablet under your tongue. You can also take this medicine 5 to 10 minutes before an event likely to  produce chest pain. Follow the directions on the prescription label. Let the tablet dissolve under the tongue. Do not swallow whole. Replace the dose if you accidentally swallow it. It will help if your mouth is not dry. Saliva around the tablet will help it to dissolve more quickly. Do not eat or drink, smoke or chew tobacco while a tablet is dissolving. If you are not better within 5 minutes after taking ONE dose of nitroglycerin, call 9-1-1 immediately to seek emergency medical care. Do not take more than 3 nitroglycerin tablets over 15 minutes. If you take this medicine often to relieve symptoms of angina, your doctor or health care professional may provide you with different instructions to manage your symptoms. If symptoms do not go away after following these instructions, it is important to call 9-1-1 immediately. Do not take more than 3 nitroglycerin tablets over 15 minutes. Talk to your pediatrician regarding the use of this medicine in children. Special care may be needed. Overdosage: If you think you have taken too much of this medicine contact a poison control center or emergency room at once. NOTE: This medicine is only for you. Do not share this medicine with others. What if I miss a dose? This does not apply. This medicine is only used as needed. What may interact with this medicine? Do not take this medicine with any of the following medications: -certain migraine medicines like ergotamine and dihydroergotamine (DHE) -medicines used to treat erectile dysfunction like sildenafil, tadalafil, and vardenafil -riociguat This medicine may also interact with the following medications: -alteplase -aspirin -heparin -medicines for high blood pressure -medicines for mental depression -other medicines used to treat angina -phenothiazines  like chlorpromazine, mesoridazine, prochlorperazine, thioridazine This list may not describe all possible interactions. Give your health care provider a list  of all the medicines, herbs, non-prescription drugs, or dietary supplements you use. Also tell them if you smoke, drink alcohol, or use illegal drugs. Some items may interact with your medicine. What should I watch for while using this medicine? Tell your doctor or health care professional if you feel your medicine is no longer working. Keep this medicine with you at all times. Sit or lie down when you take your medicine to prevent falling if you feel dizzy or faint after using it. Try to remain calm. This will help you to feel better faster. If you feel dizzy, take several deep breaths and lie down with your feet propped up, or bend forward with your head resting between your knees. You may get drowsy or dizzy. Do not drive, use machinery, or do anything that needs mental alertness until you know how this drug affects you. Do not stand or sit up quickly, especially if you are an older patient. This reduces the risk of dizzy or fainting spells. Alcohol can make you more drowsy and dizzy. Avoid alcoholic drinks. Do not treat yourself for coughs, colds, or pain while you are taking this medicine without asking your doctor or health care professional for advice. Some ingredients may increase your blood pressure. What side effects may I notice from receiving this medicine? Side effects that you should report to your doctor or health care professional as soon as possible: -blurred vision -dry mouth -skin rash -sweating -the feeling of extreme pressure in the head -unusually weak or tired Side effects that usually do not require medical attention (report to your doctor or health care professional if they continue or are bothersome): -flushing of the face or neck -headache -irregular heartbeat, palpitations -nausea, vomiting This list may not describe all possible side effects. Call your doctor for medical advice about side effects. You may report side effects to FDA at 1-800-FDA-1088. Where should I  keep my medicine? Keep out of the reach of children. Store at room temperature between 20 and 25 degrees C (68 and 77 degrees F). Store in [facility] of Staff. Protect from light and moisture. Keep tightly closed. Throw away any unused medicine after the expiration date. NOTE: This sheet is a summary. It may not cover all possible information. If you have questions about this medicine, talk to your doctor, pharmacist, or health care provider.  2018 Elsevier/Gold Standard (2013-09-22 17:57:36)    If you need a refill on your cardiac medications before your next appointment, please call your pharmacy.

## 2017-10-27 DIAGNOSIS — M9903 Segmental and somatic dysfunction of lumbar region: Secondary | ICD-10-CM | POA: Diagnosis not present

## 2017-10-27 DIAGNOSIS — M5431 Sciatica, right side: Secondary | ICD-10-CM | POA: Diagnosis not present

## 2017-10-27 DIAGNOSIS — M9905 Segmental and somatic dysfunction of pelvic region: Secondary | ICD-10-CM | POA: Diagnosis not present

## 2017-10-27 DIAGNOSIS — M5134 Other intervertebral disc degeneration, thoracic region: Secondary | ICD-10-CM | POA: Diagnosis not present

## 2017-10-27 DIAGNOSIS — M9902 Segmental and somatic dysfunction of thoracic region: Secondary | ICD-10-CM | POA: Diagnosis not present

## 2017-10-27 DIAGNOSIS — M5136 Other intervertebral disc degeneration, lumbar region: Secondary | ICD-10-CM | POA: Diagnosis not present

## 2017-11-02 ENCOUNTER — Ambulatory Visit (INDEPENDENT_AMBULATORY_CARE_PROVIDER_SITE_OTHER): Payer: Medicare Other | Admitting: Pharmacist

## 2017-11-02 DIAGNOSIS — Z5181 Encounter for therapeutic drug level monitoring: Secondary | ICD-10-CM

## 2017-11-02 DIAGNOSIS — D6859 Other primary thrombophilia: Secondary | ICD-10-CM | POA: Diagnosis not present

## 2017-11-02 LAB — POCT INR: INR: 1.7

## 2017-11-02 NOTE — Patient Instructions (Signed)
Take an extra 1/2 tablet today, then continue taking 1 tablet daily. Recheck in 2 weeks. Call us with any new medications or concerns [phone] [facility], Main # [phone].

## 2017-11-06 DIAGNOSIS — Z961 Presence of intraocular lens: Secondary | ICD-10-CM | POA: Diagnosis not present

## 2017-11-06 DIAGNOSIS — H35033 Hypertensive retinopathy, bilateral: Secondary | ICD-10-CM | POA: Diagnosis not present

## 2017-11-06 DIAGNOSIS — H1852 Epithelial (juvenile) corneal dystrophy: Secondary | ICD-10-CM | POA: Diagnosis not present

## 2017-11-06 DIAGNOSIS — H25012 Cortical age-related cataract, left eye: Secondary | ICD-10-CM | POA: Diagnosis not present

## 2017-11-06 DIAGNOSIS — H04123 Dry eye syndrome of bilateral lacrimal glands: Secondary | ICD-10-CM | POA: Diagnosis not present

## 2017-11-06 LAB — HM DIABETES EYE EXAM

## 2017-11-13 ENCOUNTER — Telehealth: Payer: Self-pay | Admitting: *Deleted

## 2017-11-13 NOTE — Telephone Encounter (Signed)
Patient's wife returned call. States he saw his cardiologist last month and was told BP was okay and to continue current meds, so they do not wish to schedule follow-up w/ Dr. Martinique at this time. Explained that he is also due for medical follow-up for vitamin D, gait, and weight. They decline to schedule at this time and will call back at another time to set up a follow-up appointment.

## 2017-11-13 NOTE — Telephone Encounter (Signed)
Records received from [facility]. Recommended follow-up w/ PCP for blood pressure. Called patient and he did not have his calendar with him but stated he would call back to schedule. Notes forwarded to PCP.

## 2017-11-27 ENCOUNTER — Ambulatory Visit (INDEPENDENT_AMBULATORY_CARE_PROVIDER_SITE_OTHER): Payer: Medicare Other | Admitting: Pharmacist

## 2017-11-27 DIAGNOSIS — D6859 Other primary thrombophilia: Secondary | ICD-10-CM | POA: Diagnosis not present

## 2017-11-27 DIAGNOSIS — Z5181 Encounter for therapeutic drug level monitoring: Secondary | ICD-10-CM | POA: Diagnosis not present

## 2017-11-27 LAB — POCT INR: INR: 1.5

## 2017-11-27 NOTE — Patient Instructions (Signed)
Description   Continue taking 1 tablet daily. Recheck in 2 weeks. Call us with any new medications or concerns [phone] [facility], Main # [phone].

## 2017-12-11 ENCOUNTER — Ambulatory Visit (INDEPENDENT_AMBULATORY_CARE_PROVIDER_SITE_OTHER): Payer: Medicare Other | Admitting: Pharmacist

## 2017-12-11 DIAGNOSIS — D6859 Other primary thrombophilia: Secondary | ICD-10-CM | POA: Diagnosis not present

## 2017-12-11 DIAGNOSIS — Z5181 Encounter for therapeutic drug level monitoring: Secondary | ICD-10-CM

## 2017-12-11 LAB — POCT INR: INR: 3.5

## 2017-12-11 NOTE — Patient Instructions (Signed)
Description   Skip your Coumadin tomorrow, then continue taking 1 tablet daily. Recheck in 2 weeks. Call us with any new medications or concerns [phone] [facility], Main # [phone].

## 2017-12-28 ENCOUNTER — Ambulatory Visit (INDEPENDENT_AMBULATORY_CARE_PROVIDER_SITE_OTHER): Payer: Medicare Other | Admitting: *Deleted

## 2017-12-28 DIAGNOSIS — Z5181 Encounter for therapeutic drug level monitoring: Secondary | ICD-10-CM

## 2017-12-28 DIAGNOSIS — D6859 Other primary thrombophilia: Secondary | ICD-10-CM | POA: Diagnosis not present

## 2017-12-28 LAB — POCT INR: INR: 4.7

## 2017-12-28 NOTE — Patient Instructions (Signed)
Description   Skip your Coumadin today and tomorrow then start taking 1 tablet daily except 1/2 tablet on Sundays. Recheck in 10 days. Call us with any new medications or concerns [phone] [facility], Main # [phone].

## 2018-01-07 ENCOUNTER — Ambulatory Visit (INDEPENDENT_AMBULATORY_CARE_PROVIDER_SITE_OTHER): Payer: Medicare Other | Admitting: *Deleted

## 2018-01-07 DIAGNOSIS — Z5181 Encounter for therapeutic drug level monitoring: Secondary | ICD-10-CM

## 2018-01-07 DIAGNOSIS — D6859 Other primary thrombophilia: Secondary | ICD-10-CM

## 2018-01-07 LAB — POCT INR: INR: 3.6

## 2018-01-07 NOTE — Patient Instructions (Signed)
Description   Skip your Coumadin today's dose,  then start taking 1 tablet daily except 1/2 tablet on Mondays and Fridays. Recheck in 2 weeks. Call us with any new medications or concerns [phone] [facility], Main # [phone].

## 2018-01-22 ENCOUNTER — Ambulatory Visit (INDEPENDENT_AMBULATORY_CARE_PROVIDER_SITE_OTHER): Payer: Medicare Other | Admitting: Pharmacist

## 2018-01-22 DIAGNOSIS — D6859 Other primary thrombophilia: Secondary | ICD-10-CM

## 2018-01-22 DIAGNOSIS — Z5181 Encounter for therapeutic drug level monitoring: Secondary | ICD-10-CM

## 2018-01-22 LAB — POCT INR: INR: 2.1

## 2018-01-22 NOTE — Patient Instructions (Signed)
Description   Continue taking 1 tablet daily except 1/2 tablet on Mondays and Fridays. Recheck in 3 weeks. Call us with any new medications or concerns [phone] [facility], Main # [phone].

## 2018-02-15 ENCOUNTER — Ambulatory Visit (INDEPENDENT_AMBULATORY_CARE_PROVIDER_SITE_OTHER): Payer: Medicare Other | Admitting: *Deleted

## 2018-02-15 DIAGNOSIS — D6859 Other primary thrombophilia: Secondary | ICD-10-CM

## 2018-02-15 DIAGNOSIS — Z5181 Encounter for therapeutic drug level monitoring: Secondary | ICD-10-CM

## 2018-02-15 LAB — POCT INR: INR: 3.4

## 2018-02-15 NOTE — Patient Instructions (Signed)
Description   Skip today's dose, then Continue taking 1 tablet daily except 1/2 tablet on Mondays and Fridays. Recheck in 2 weeks. Call us with any new medications or concerns [phone] [facility], Main # [phone].

## 2018-03-01 ENCOUNTER — Ambulatory Visit (INDEPENDENT_AMBULATORY_CARE_PROVIDER_SITE_OTHER): Payer: Medicare Other | Admitting: Pharmacist

## 2018-03-01 DIAGNOSIS — D6859 Other primary thrombophilia: Secondary | ICD-10-CM

## 2018-03-01 DIAGNOSIS — Z5181 Encounter for therapeutic drug level monitoring: Secondary | ICD-10-CM

## 2018-03-01 LAB — POCT INR: INR: 2.5

## 2018-03-01 NOTE — Patient Instructions (Signed)
Continue taking 1 tablet daily except 1/2 tablet on Mondays and Fridays. Recheck in 3 weeks. Call us with any new medications or concerns [phone] [facility], Main # [phone].

## 2018-03-14 ENCOUNTER — Other Ambulatory Visit: Payer: Self-pay | Admitting: Interventional Cardiology

## 2018-03-22 ENCOUNTER — Ambulatory Visit (INDEPENDENT_AMBULATORY_CARE_PROVIDER_SITE_OTHER): Payer: Medicare Other | Admitting: *Deleted

## 2018-03-22 DIAGNOSIS — D6859 Other primary thrombophilia: Secondary | ICD-10-CM

## 2018-03-22 DIAGNOSIS — Z5181 Encounter for therapeutic drug level monitoring: Secondary | ICD-10-CM | POA: Diagnosis not present

## 2018-03-22 LAB — POCT INR: INR: 3.5
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
## 2018-03-22 NOTE — Patient Instructions (Signed)
Description   Today April 15th do not take coumadin then tomorrow April 16th take 1/2 tablet then continue taking 1 tablet daily except 1/2 tablet on Mondays and Fridays. Recheck in 2 weeks. Call us with any new medications or concerns [phone] [facility], Main # [phone].

## 2018-03-23 ENCOUNTER — Ambulatory Visit (INDEPENDENT_AMBULATORY_CARE_PROVIDER_SITE_OTHER): Payer: Medicare Other

## 2018-03-23 VITALS — BP 136/70 | HR 64 | Ht 74.0 in | Wt 207.5 lb

## 2018-03-23 DIAGNOSIS — Z0000 Encounter for general adult medical examination without abnormal findings: Secondary | ICD-10-CM | POA: Diagnosis not present

## 2018-03-23 DIAGNOSIS — Z1159 Encounter for screening for other viral diseases: Secondary | ICD-10-CM

## 2018-03-23 NOTE — Patient Instructions (Addendum)
[name] , Thank you for taking time to come for your Medicare Wellness Visit. I appreciate your ongoing commitment to your health goals. Please review the following plan we discussed and let me know if I can assist you in the future.   Please make an apt for next year. You can schedule this with Dr. Doug Sou visit  Will make an apt with Dr. Martinique   We need your records from [place]; Colonoscopy report or film We need to know if you received your pneumonia vaccines  You get 2 of these at 65; pneumovax 23 and the other is the prevnar 13  You would have had the prevnar at 66 and PSV 23 at 67;   We need to know the last tetanus  As an adult, you should take the tetanus with whooping cough; (pertussis) TDAP   Medicare now request all "baby boomers" test for possible exposure to Hepatitis C. Many may have been exposed due to dental work, tatoo's, vaccinations when young. The Hepatitis C virus is dormant for many years and then sometimes will cause liver cancer. If you gave blood in the past 15 years, you were most likely checked for Hep C. If you rec'd blood; you may want to consider testing or if you are high risk for any other reason.   Deaf & Hard of Hearing Division Services - can assist with hearing aid x 1  No reviews  [employer] Office  [phone]  [phone] - [place]   http://clienthiadev.devcloud.acquia-sites.com/sites/default/files/hearingpedia/Guide_How_to_Buy_Hearing_Aids.pdf   These are the goals we discussed: Goals    . Patient Stated     Get back on the walker to walk more  Can walk in the neighborhood        This is a list of the screening recommended for you and due dates:  Health Maintenance  Topic Date Due  .  Hepatitis C: One time screening is recommended by Center for Disease Control  (CDC) for  adults born from [birth year] through 1964.   [birth year]  . Tetanus Vaccine  10/31/1966  . Colon Cancer Screening  10/31/1997  . Pneumonia vaccines  (1 of 2 - PCV13) 10/31/2012  . Flu Shot  07/08/2018    Health Maintenance, Male A healthy lifestyle and preventive care is important for your health and wellness. Ask your health care provider about what schedule of regular examinations is right for you. What should I know about weight and diet? Eat a Healthy Diet  Eat plenty of vegetables, fruits, whole grains, low-fat dairy products, and lean protein.  Do not eat a lot of foods high in solid fats, added sugars, or salt.  Maintain a Healthy Weight Regular exercise can help you achieve or maintain a healthy weight. You should:  Do at least 150 minutes of exercise each week. The exercise should increase your heart rate and make you sweat (moderate-intensity exercise).  Do strength-training exercises at least twice a week.  Watch Your Levels of Cholesterol and Blood Lipids  Have your blood tested for lipids and cholesterol every 5 years starting at 71 years of age. If you are at high risk for heart disease, you should start having your blood tested when you are 71 years old. You may need to have your cholesterol levels checked more often if: ? Your lipid or cholesterol levels are high. ? You are older than 71 years of age. ? You are at high risk for heart disease.  What should I know  about cancer screening? Many types of cancers can be detected early and may often be prevented. Lung Cancer  You should be screened every year for lung cancer if: ? You are a current smoker who has smoked for at least 30 years. ? You are a former smoker who has quit within the past 15 years.  Talk to your health care provider about your screening options, when you should start screening, and how often you should be screened.  Colorectal Cancer  Routine colorectal cancer screening usually begins at 71 years of age and should be repeated every 5-10 years until you are 71 years old. You may need to be screened more often if early forms of precancerous  polyps or small growths are found. Your health care provider may recommend screening at an earlier age if you have risk factors for colon cancer.  Your health care provider may recommend using home test kits to check for hidden blood in the stool.  A small camera at the end of a tube can be used to examine your colon (sigmoidoscopy or colonoscopy). This checks for the earliest forms of colorectal cancer.  Prostate and Testicular Cancer  Depending on your age and overall health, your health care provider may do certain tests to screen for prostate and testicular cancer.  Talk to your health care provider about any symptoms or concerns you have about testicular or prostate cancer.  Skin Cancer  Check your skin from head to toe regularly.  Tell your health care provider about any new moles or changes in moles, especially if: ? There is a change in a mole's size, shape, or color. ? You have a mole that is larger than a pencil eraser.  Always use sunscreen. Apply sunscreen liberally and repeat throughout the day.  Protect yourself by wearing long sleeves, pants, a wide-brimmed hat, and sunglasses when outside.  What should I know about heart disease, diabetes, and high blood pressure?  If you are 70-41 years of age, have your blood pressure checked every 3-5 years. If you are 29 years of age or older, have your blood pressure checked every year. You should have your blood pressure measured twice-once when you are at a hospital or clinic, and once when you are not at a hospital or clinic. Record the average of the two measurements. To check your blood pressure when you are not at a hospital or clinic, you can use: ? An automated blood pressure machine at a pharmacy. ? A home blood pressure monitor.  Talk to your health care provider about your target blood pressure.  If you are between 47-42 years old, ask your health care provider if you should take aspirin to prevent heart  disease.  Have regular diabetes screenings by checking your fasting blood sugar level. ? If you are at a normal weight and have a low risk for diabetes, have this test once every three years after the age of 15. ? If you are overweight and have a high risk for diabetes, consider being tested at a younger age or more often.  A one-time screening for abdominal aortic aneurysm (AAA) by ultrasound is recommended for men aged 35-75 years who are current or former smokers. What should I know about preventing infection? Hepatitis B If you have a higher risk for hepatitis B, you should be screened for this virus. Talk with your health care provider to find out if you are at risk for hepatitis B infection. Hepatitis C Blood testing is  recommended for:  Everyone born from [birth year] through 1965.  Anyone with known risk factors for hepatitis C.  Sexually Transmitted Diseases (STDs)  You should be screened each year for STDs including gonorrhea and chlamydia if: ? You are sexually active and are younger than 71 years of age. ? You are older than 71 years of age and your health care provider tells you that you are at risk for this type of infection. ? Your sexual activity has changed since you were last screened and you are at an increased risk for chlamydia or gonorrhea. Ask your health care provider if you are at risk.  Talk with your health care provider about whether you are at high risk of being infected with HIV. Your health care provider may recommend a prescription medicine to help prevent HIV infection.  What else can I do?  Schedule regular health, dental, and eye exams.  Stay current with your vaccines (immunizations).  Do not use any tobacco products, such as cigarettes, chewing tobacco, and e-cigarettes. If you need help quitting, ask your health care provider.  Limit alcohol intake to no more than 2 drinks per day. One drink equals 12 ounces of beer, 5 ounces of wine, or 1 ounces of  hard liquor.  Do not use street drugs.  Do not share needles.  Ask your health care provider for help if you need support or information about quitting drugs.  Tell your health care provider if you often feel depressed.  Tell your health care provider if you have ever been abused or do not feel safe at home. This information is not intended to replace advice given to you by your health care provider. Make sure you discuss any questions you have with your health care provider. Document Released: 05/22/2008 Document Revised: 07/23/2016 Document Reviewed: 08/28/2015 Elsevier Interactive Patient Education  2018 [place] in the Home Falls can cause injuries and can affect people from all age groups. There are many simple things that you can do to make your home safe and to help prevent falls. What can I do on the outside of my home?  Regularly repair the edges of walkways and driveways and fix any cracks.  Remove high doorway thresholds.  Trim any shrubbery on the main path into your home.  Use bright outdoor lighting.  Clear walkways of debris and clutter, including tools and rocks.  Regularly check that handrails are securely fastened and in good repair. Both sides of any steps should have handrails.  Install guardrails along the edges of any raised decks or porches.  Have leaves, snow, and ice cleared regularly.  Use sand or salt on walkways during winter months.  In the garage, clean up any spills right away, including grease or oil spills. What can I do in the bathroom?  Use night lights.  Install grab bars by the toilet and in the tub and shower. Do not use towel bars as grab bars.  Use non-skid mats or decals on the floor of the tub or shower.  If you need to sit down while you are in the shower, use a plastic, non-slip stool.  Keep the floor dry. Immediately clean up any water that spills on the floor.  Remove soap buildup in the tub or  shower on a regular basis.  Attach bath mats securely with double-sided non-slip rug tape.  Remove throw rugs and other tripping hazards from the floor. What can I do in the bedroom?  Use night lights.  Make sure that a bedside light is easy to reach.  Do not use oversized bedding that drapes onto the floor.  Have a firm chair that has side arms to use for getting dressed.  Remove throw rugs and other tripping hazards from the floor. What can I do in the kitchen?  Clean up any spills right away.  Avoid walking on wet floors.  Place frequently used items in easy-to-reach places.  If you need to reach for something above you, use a sturdy step stool that has a grab bar.  Keep electrical cables out of the way.  Do not use floor polish or wax that makes floors slippery. If you have to use wax, make sure that it is non-skid floor wax.  Remove throw rugs and other tripping hazards from the floor. What can I do in the stairways?  Do not leave any items on the stairs.  Make sure that there are handrails on both sides of the stairs. Fix handrails that are broken or loose. Make sure that handrails are as long as the stairways.  Check any carpeting to make sure that it is firmly attached to the stairs. Fix any carpet that is loose or worn.  Avoid having throw rugs at the top or bottom of stairways, or secure the rugs with carpet tape to prevent them from moving.  Make sure that you have a light switch at the top of the stairs and the bottom of the stairs. If you do not have them, have them installed. What are some other fall prevention tips?  Wear closed-toe shoes that fit well and support your feet. Wear shoes that have rubber soles or low heels.  When you use a stepladder, make sure that it is completely opened and that the sides are firmly locked. Have someone hold the ladder while you are using it. Do not climb a closed stepladder.  Add color or contrast paint or tape to grab  bars and handrails in your home. Place contrasting color strips on the first and last steps.  Use mobility aids as needed, such as canes, walkers, scooters, and crutches.  Turn on lights if it is dark. Replace any light bulbs that burn out.  Set up furniture so that there are clear paths. Keep the furniture in the same spot.  Fix any uneven floor surfaces.  Choose a carpet design that does not hide the edge of steps of a stairway.  Be aware of any and all pets.  Review your medicines with your healthcare provider. Some medicines can cause dizziness or changes in blood pressure, which increase your risk of falling. Talk with your health care provider about other ways that you can decrease your risk of falls. This may include working with a physical therapist or trainer to improve your strength, balance, and endurance. This information is not intended to replace advice given to you by your health care provider. Make sure you discuss any questions you have with your health care provider. Document Released: 11/14/2002 Document Revised: 04/22/2016 Document Reviewed: 12/29/2014 Elsevier Interactive Patient Education  [employer].

## 2018-03-24 NOTE — Progress Notes (Signed)
I have reviewed documentation from this visit and I agree with recommendations given.  Betty G. Jordan, MD   [facility]. [place] office.   

## 2018-03-28 NOTE — Progress Notes (Signed)
HPI:   [name] is a 72 y.o. male, who is here today with his wife for follow up.   He was last seen in 03/2017.  Since his last OV he has seen cardiologist.  He follows with cardiologist about once per year, Coumadin clinic every 3 to 4 weeks depending off INR.  He is still following with Dr Lawson Radar, chiropractor. Frontal scalp SCC removed.   Hypertension:   Currently on Losartan 100 mg and Norvasc 5 mg daily.   Home BP's: Checking occasionally, 130's/72, 140/80's. He is taking medications as instructed, no side effects reported.  He has not noted unusual headache, visual changes, exertional chest pain, dyspnea,  focal weakness, or edema. Last eye exam 4-5 months ago.   Lab Results  Component Value Date   CREATININE 0.93 02/10/2017   BUN 11 02/10/2017   NA 145 (H) 02/10/2017   K 4.4 02/10/2017   CL 103 02/10/2017   CO2 25 02/10/2017     Hyperlipidemia:  Currently on Crestor 40 mg daily,Zetia 10 mg, and Tricor 145 mg daily.  Following a low fat diet: No fried food, fat free milk,and cheese.  He has not noted side effects with medication.  Lab Results  Component Value Date   CHOL 118 02/10/2017   HDL 56 02/10/2017   LDLCALC 45 02/10/2017   TRIG 84 02/10/2017   CHOLHDL 2.1 02/10/2017   Vitamin D deficiency: Currently he is not on vitamin D supplementation. A low calcium rate of 50,000 units was discontinue about a year ago because of elevated vitamin D levels.    Review of Systems  Constitutional: Negative for activity change, appetite change, fatigue and fever.  HENT: Negative for nosebleeds, sore throat and trouble swallowing.   Eyes: Negative for redness and visual disturbance.  Respiratory: Negative for cough, shortness of breath and wheezing.   Cardiovascular: Negative for chest pain, palpitations and leg swelling.  Gastrointestinal: Negative for abdominal pain, nausea and vomiting.  Endocrine: Negative for cold intolerance and  heat intolerance.  Genitourinary: Negative for decreased urine volume and hematuria.  Musculoskeletal: Positive for gait problem.  Neurological: Negative for syncope, weakness and headaches.  Psychiatric/Behavioral: Negative for confusion. The patient is not nervous/anxious.       Current Outpatient Medications on File Prior to Visit  Medication Sig Dispense Refill  . acetaminophen (TYLENOL) 500 MG tablet Take 500 mg as needed by mouth.    . ezetimibe (ZETIA) 10 MG tablet Take 10 mg by mouth at bedtime.    . fenofibrate (TRICOR) 145 MG tablet Take 145 mg by mouth at bedtime.    . rosuvastatin (CRESTOR) 40 MG tablet TAKE 1 TABLET AT BEDTIME 90 tablet 2  . warfarin (COUMADIN) 5 MG tablet Take 1 tablet (5 mg total) daily at 6 PM by mouth. 90 tablet 1  . nitroGLYCERIN (NITROSTAT) 0.4 MG SL tablet Place 1 tablet (0.4 mg total) every 5 (five) minutes as needed under the tongue for chest pain. 25 tablet 3   No current facility-administered medications on file prior to visit.      Past Medical History:  Diagnosis Date  . Allergy   . Arthritis   . 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 Cataracts, bilateral   . Chicken pox   . Dyslipidemia   . Hyperlipidemia   . Hypertension   . Sinus bradycardia    Allergies  Allergen Reactions  . Other     Environmental allergies  Social History   Socioeconomic History  . Marital status: Married    Spouse [name]  . Number of children: 2  . Years of education: COLLEGE  . Highest education level: Not on file  Occupational History  . Occupation: RETIRED  Social Needs  . Financial resource strain: Not on file  . Food insecurity:    Worry: Not on file    Inability: Not on file  . Transportation needs:    Medical: Not on file    Non-medical: Not on file  Tobacco Use  . Smoking status: Former Smoker    Last attempt to quit: 05/29/1982    Years since quitting: 35.8  . Smokeless tobacco: Never Used  Substance and Sexual  Activity  . Alcohol use: Yes    Comment: 0-2 drinks a night  . Drug use: No  . Sexual activity: Not on file  Lifestyle  . Physical activity:    Days per week: Not on file    Minutes per session: Not on file  . Stress: Not on file  Relationships  . Social connections:    Talks on phone: Not on file    Gets together: Not on file    Attends religious service: Not on file    Active member of club or organization: Not on file    Attends meetings of clubs or organizations: Not on file    Relationship status: Not on file  Other Topics Concern  . Not on file  Social History Narrative  . Not on file    Vitals:   03/29/18 0851  BP: 140/83  Pulse: 63  Resp: 12  Temp: 97.8 F (36.6 C)  SpO2: 97%   Body mass index is 26.77 kg/m.   Physical Exam  Nursing note and vitals reviewed. Constitutional: He is oriented to person, place, and time. He appears well-developed and well-nourished. No distress.  HENT:  Head: Normocephalic and atraumatic.  Mouth/Throat: Oropharynx is clear and moist and mucous membranes are normal.  Eyes: Pupils are equal, round, and reactive to light. Conjunctivae are normal.  Cardiovascular: Normal rate and regular rhythm.  No murmur heard. Pulses:      Dorsalis pedis pulses are 2+ on the right side, and 2+ on the left side.  Respiratory: Effort normal and breath sounds normal. No respiratory distress.  GI: Soft. He exhibits no mass. There is no hepatomegaly. There is no tenderness.  Musculoskeletal: He exhibits no edema.  Lymphadenopathy:    He has no cervical adenopathy.  Neurological: He is alert and oriented to person, place, and time. He has normal strength.  Unstable gait assisted with a cane.  Skin: Skin is warm. No rash noted. No erythema.  Psychiatric: He has a normal mood and affect.  Well groomed, good eye contact.       ASSESSMENT AND PLAN:   Mr. [name] was seen today for follow-up.  Orders Placed This Encounter    Procedures  . Comprehensive metabolic panel  . Lipid panel  . VITAMIN D 25 Hydroxy (Vit-D Deficiency, Fractures)   Lab Results  Component Value Date   CHOL 130 03/29/2018   HDL 47.10 03/29/2018   LDLCALC 66 03/29/2018   TRIG 84.0 03/29/2018   CHOLHDL 3 03/29/2018   Lab Results  Component Value Date   CREATININE 0.91 03/29/2018   BUN 16 03/29/2018   NA 143 03/29/2018   K 4.2 03/29/2018   CL 108 03/29/2018   CO2 27 03/29/2018   Lab Results  Component Value Date   ALT 19 03/29/2018   AST 19 03/29/2018   ALKPHOS 41 03/29/2018   BILITOT 0.6 03/29/2018    Essential hypertension BP still mildly elevated, so we agreed on increasing amlodipine from 5 mg to 10 mg daily. Continue losartan 100 mg daily. Continue low-salt diet. Eye exam is current. 6 weeks follow-up, nurse visit for BP check, I will see him back in 6 months.  Hyperlipidemia No changes in current management. Further recommendation will be given according to lipid panel results. Low-fat diet also recommended. Follow-up in 6 to 12 months.  Vitamin D deficiency, unspecified For now we will hold on vitamin D supplementation I will make recommendations according to labs.    -Mr. [name] was advised to return sooner than planned today if new concerns arise.       Shailynn Fong G. Martinique, MD  [facility]. [place] office.

## 2018-03-29 ENCOUNTER — Encounter: Payer: Self-pay | Admitting: Family Medicine

## 2018-03-29 ENCOUNTER — Ambulatory Visit (INDEPENDENT_AMBULATORY_CARE_PROVIDER_SITE_OTHER): Payer: Medicare Other | Admitting: Family Medicine

## 2018-03-29 ENCOUNTER — Other Ambulatory Visit: Payer: Self-pay | Admitting: *Deleted

## 2018-03-29 ENCOUNTER — Telehealth: Payer: Self-pay | Admitting: Family Medicine

## 2018-03-29 VITALS — BP 140/83 | HR 63 | Temp 97.8°F | Resp 12 | Ht 74.0 in | Wt 208.5 lb

## 2018-03-29 DIAGNOSIS — E782 Mixed hyperlipidemia: Secondary | ICD-10-CM | POA: Diagnosis not present

## 2018-03-29 DIAGNOSIS — E559 Vitamin D deficiency, unspecified: Secondary | ICD-10-CM

## 2018-03-29 DIAGNOSIS — Z1159 Encounter for screening for other viral diseases: Secondary | ICD-10-CM

## 2018-03-29 DIAGNOSIS — I10 Essential (primary) hypertension: Secondary | ICD-10-CM | POA: Diagnosis not present

## 2018-03-29 LAB — COMPREHENSIVE METABOLIC PANEL
ALBUMIN: 4.5 g/dL (ref 3.5–5.2)
ALK PHOS: 41 U/L (ref 39–117)
ALT: 19 U/L (ref 0–53)
AST: 19 U/L (ref 0–37)
BILIRUBIN TOTAL: 0.6 mg/dL (ref 0.2–1.2)
BUN: 16 mg/dL (ref 6–23)
CO2: 27 mEq/L (ref 19–32)
CREATININE: 0.91 mg/dL (ref 0.40–1.50)
Calcium: 10.3 mg/dL (ref 8.4–10.5)
Chloride: 108 mEq/L (ref 96–112)
GFR: 87.44 mL/min (ref >60.00)
GLUCOSE: 102 mg/dL — AB (ref 70–99)
POTASSIUM: 4.2 meq/L (ref 3.5–5.1)
SODIUM: 143 meq/L (ref 135–145)
TOTAL PROTEIN: 6.5 g/dL (ref 6.0–8.3)

## 2018-03-29 LAB — LIPID PANEL
CHOLESTEROL: 130 mg/dL (ref 0–200)
HDL: 47.1 mg/dL (ref >39.00)
LDL Cholesterol: 66 mg/dL (ref 0–99)
NONHDL: 83.1
Total CHOL/HDL Ratio: 3
Triglycerides: 84 mg/dL (ref 0.0–149.0)
VLDL: 16.8 mg/dL (ref 0.0–40.0)

## 2018-03-29 LAB — VITAMIN D 25 HYDROXY (VIT D DEFICIENCY, FRACTURES): VITD: 44.01 ng/mL (ref 30.00–100.00)

## 2018-03-29 MED ORDER — LOSARTAN POTASSIUM 100 MG PO TABS: 100.0000 mg | ORAL_TABLET | Freq: Every day | ORAL | 1 refills | Status: DC

## 2018-03-29 MED ORDER — AMLODIPINE BESYLATE 10 MG PO TABS: 10.0000 mg | ORAL_TABLET | Freq: Every day | ORAL | 1 refills | Status: DC

## 2018-03-29 NOTE — Assessment & Plan Note (Signed)
For now we will hold on vitamin D supplementation I will make recommendations according to labs.

## 2018-03-29 NOTE — Addendum Note (Signed)
Addended by: Tomi Likens on: 03/29/2018 10:22 AM   Modules accepted: Orders

## 2018-03-29 NOTE — Telephone Encounter (Signed)
Copied from [place] [id number]. Topic: Quick Communication - Rx Refill/Question >> Mar 29, 2018  3:38 PM Scherrie Gerlach wrote: Medication: losartan (COZAAR) 100 MG tablet Has the patient contacted their pharmacy? yes [facility] request Dr Martinique has never filled this Rx for the pt. [place], [place] - [address] [phone] (Phone) [phone] (Fax)

## 2018-03-29 NOTE — Patient Instructions (Signed)
A few things to remember from today's visit:   Essential hypertension - Plan: Comprehensive metabolic panel, amLODipine (NORVASC) 10 MG tablet  Mixed hyperlipidemia - Plan: Comprehensive metabolic panel, Lipid panel  Vitamin D deficiency, unspecified - Plan: VITAMIN D 25 Hydroxy (Vit-D Deficiency, Fractures)  Amlodipine increased from 5 mg to 10 mg. Rest unchanged.   Please be sure medication list is accurate. If a new problem present, please set up appointment sooner than planned today.

## 2018-03-29 NOTE — Telephone Encounter (Signed)
Message routed to Dr. Martinique for review and approval.

## 2018-03-29 NOTE — Assessment & Plan Note (Signed)
No changes in current management. Further recommendation will be given according to lipid panel results. Low-fat diet also recommended. Follow-up in 6 to 12 months.

## 2018-03-29 NOTE — Assessment & Plan Note (Signed)
BP still mildly elevated, so we agreed on increasing amlodipine from 5 mg to 10 mg daily. Continue losartan 100 mg daily. Continue low-salt diet. Eye exam is current. 6 weeks follow-up, nurse visit for BP check, I will see him back in 6 months.

## 2018-03-30 LAB — HEPATITIS C ANTIBODY
HEP C AB: NONREACTIVE
SIGNAL TO CUT-OFF: 0.01 (ref <1.00)

## 2018-03-30 NOTE — Telephone Encounter (Signed)
No further actions at this time.

## 2018-03-30 NOTE — Telephone Encounter (Signed)
This Rx was sent yesterday. Thanks, BJ

## 2018-04-05 ENCOUNTER — Ambulatory Visit (INDEPENDENT_AMBULATORY_CARE_PROVIDER_SITE_OTHER): Payer: Medicare Other | Admitting: Pharmacist

## 2018-04-05 DIAGNOSIS — D6859 Other primary thrombophilia: Secondary | ICD-10-CM

## 2018-04-05 DIAGNOSIS — Z5181 Encounter for therapeutic drug level monitoring: Secondary | ICD-10-CM

## 2018-04-05 LAB — POCT INR: INR: 3.1

## 2018-04-05 NOTE — Patient Instructions (Addendum)
Today, take a 1/2 tablet and tomorrow take 1/2 tablet. Then start taking 1/2 tablet on Monday, Wednesday, Fridays and 1 tablet all other days. Recheck in 2 weeks. Call us with any new medications or concerns [phone] [facility], Main # [phone].

## 2018-04-06 ENCOUNTER — Other Ambulatory Visit: Payer: Self-pay | Admitting: Interventional Cardiology

## 2018-04-19 ENCOUNTER — Ambulatory Visit (INDEPENDENT_AMBULATORY_CARE_PROVIDER_SITE_OTHER): Payer: Medicare Other | Admitting: *Deleted

## 2018-04-19 DIAGNOSIS — D6859 Other primary thrombophilia: Secondary | ICD-10-CM

## 2018-04-19 DIAGNOSIS — Z5181 Encounter for therapeutic drug level monitoring: Secondary | ICD-10-CM | POA: Diagnosis not present

## 2018-04-19 LAB — POCT INR: INR: 2.4

## 2018-04-19 NOTE — Patient Instructions (Signed)
Description   Continue taking 1/2 tablet on Monday, Wednesday, Fridays and 1 tablet on all other days. Recheck in 3 weeks. Call us with any new medications or concerns [phone] [facility], Main # [phone].

## 2018-05-10 ENCOUNTER — Ambulatory Visit (INDEPENDENT_AMBULATORY_CARE_PROVIDER_SITE_OTHER): Payer: Medicare Other | Admitting: Family Medicine

## 2018-05-10 VITALS — BP 137/79 | HR 71

## 2018-05-10 DIAGNOSIS — R030 Elevated blood-pressure reading, without diagnosis of hypertension: Secondary | ICD-10-CM

## 2018-05-10 DIAGNOSIS — I10 Essential (primary) hypertension: Secondary | ICD-10-CM

## 2018-05-10 NOTE — Progress Notes (Signed)
Patient here for nurse visit BP check per order from Dr. Martinique, reading 137/79 and pulse 71. Patient was concerned about pulse dropping at home range 50-68. Per Dr. Martinique continue to monitor if pulse drops below 50 please contact our office.   Patient reports compliance with prescribed BP medications: yes  Last dose of BP medication: took Amlodipine this morning and will take Losartan at night.   BP Readings from Last 3 Encounters:  03/29/18 140/83  03/23/18 136/70  10/22/17 (!) 144/80   Pulse Readings from Last 3 Encounters:  03/29/18 63  03/23/18 64  10/22/17 80    Per Dr. Martinique, continue with both b/p mediations, monitor b/p at home and follow up as needed, he already has appointment scheduled.    Patient verbalized understanding of instructions.   NIMMONS, SYLVIA ANN, RN

## 2018-05-11 ENCOUNTER — Ambulatory Visit (INDEPENDENT_AMBULATORY_CARE_PROVIDER_SITE_OTHER): Payer: Medicare Other | Admitting: *Deleted

## 2018-05-11 DIAGNOSIS — D6859 Other primary thrombophilia: Secondary | ICD-10-CM | POA: Diagnosis not present

## 2018-05-11 DIAGNOSIS — Z5181 Encounter for therapeutic drug level monitoring: Secondary | ICD-10-CM

## 2018-05-11 LAB — POCT INR: INR: 2.7 (ref 2.0–3.0)

## 2018-05-11 NOTE — Patient Instructions (Signed)
Description   Continue taking 1/2 tablet on Monday, Wednesday, Fridays and 1 tablet on all other days. Recheck in 4 weeks. Call us with any new medications or concerns [phone] [facility] Main # [phone].

## 2018-06-01 ENCOUNTER — Ambulatory Visit: Payer: Self-pay

## 2018-06-01 NOTE — Telephone Encounter (Signed)
Pt. Reports he has had swelling to both lower legs x 2 weeks. Feels like this is from his amlodipine. It was increased 3-4 weeks ago. Swelling goes up the knee and goes down at night while he is in bed. Denies any chest pain or shortness of breath. Also has red and brown "blotches to the skin on both legs." States BP is "better - 121/77" while taking the medicine. Offered appointment for today. Does not have a ride until Friday. Instructed if his symptoms worsen or he develops chest pain or shortness of breath to call 911. Verbalizes understanding. Reason for Disposition . [1] MODERATE leg swelling (e.g., swelling extends up to knees) AND [2] new onset or worsening  Answer Assessment - Initial Assessment Questions 1. ONSET: "When did the swelling start?" (e.g., minutes, hours, days)     2 weeks ago 2. LOCATION: "What part of the leg is swollen?"  "Are both legs swollen or just one leg?"     Both legs - swelling up to the knee 3. SEVERITY: "How bad is the swelling?" (e.g., localized; mild, moderate, severe)  - Localized - small area of swelling localized to one leg  - MILD pedal edema - swelling limited to foot and ankle, pitting edema < 1/4 inch (6 mm) deep, rest and elevation eliminate most or all swelling  - MODERATE edema - swelling of lower leg to knee, pitting edema > 1/4 inch (6 mm) deep, rest and elevation only partially reduce swelling  - SEVERE edema - swelling extends above knee, facial or hand swelling present      Moderate 4. REDNESS: "Does the swelling look red or infected?"     Red and brown blotches to both legs 5. PAIN: "Is the swelling painful to touch?" If so, ask: "How painful is it?"   (Scale 1-10; mild, moderate or severe)     To touch 6. FEVER: "Do you have a fever?" If so, ask: "What is it, how was it measured, and when did it start?"      No 7. CAUSE: "What do you think is causing the leg swelling?"     Reaction to amlodopine 8. MEDICAL HISTORY: "Do you have a history  of heart failure, kidney disease, liver failure, or cancer?"     CABG [id number] 9. RECURRENT SYMPTOM: "Have you had leg swelling before?" If so, ask: "When was the last time?" "What happened that time?"     Yes - on another medication 10. OTHER SYMPTOMS: "Do you have any other symptoms?" (e.g., chest pain, difficulty breathing)       No 11. PREGNANCY: "Is there any chance you are pregnant?" "When was your last menstrual period?"       n/a  Protocols used: LEG SWELLING AND EDEMA-A-AH

## 2018-06-01 NOTE — Telephone Encounter (Signed)
Called patient to follow up for appt that was scheduled 06/04/18 Pt states that it was scheduled out so far because transportation is an issue - he does not have a ride to come until Friday. Pt aware of rec's per [facility] Triage, if symptoms worsen call 911 Pt aware also that if something with his transportation changes to call the office and we will get him a sooner appt.  Nothing further needed.

## 2018-06-02 ENCOUNTER — Other Ambulatory Visit: Payer: Self-pay

## 2018-06-02 MED ORDER — EZETIMIBE 10 MG PO TABS: 10.0000 mg | ORAL_TABLET | Freq: Every day | ORAL | 1 refills | Status: DC

## 2018-06-02 MED ORDER — FENOFIBRATE 145 MG PO TABS: 145.0000 mg | ORAL_TABLET | Freq: Every day | ORAL | 1 refills | Status: DC

## 2018-06-04 ENCOUNTER — Encounter: Payer: Self-pay | Admitting: Family Medicine

## 2018-06-04 ENCOUNTER — Ambulatory Visit (INDEPENDENT_AMBULATORY_CARE_PROVIDER_SITE_OTHER): Payer: Medicare Other | Admitting: Family Medicine

## 2018-06-04 VITALS — BP 140/80 | HR 76 | Temp 98.7°F | Resp 16 | Ht 74.0 in | Wt 205.4 lb

## 2018-06-04 DIAGNOSIS — I10 Essential (primary) hypertension: Secondary | ICD-10-CM | POA: Diagnosis not present

## 2018-06-04 DIAGNOSIS — I8393 Asymptomatic varicose veins of bilateral lower extremities: Secondary | ICD-10-CM | POA: Insufficient documentation

## 2018-06-04 DIAGNOSIS — R600 Localized edema: Secondary | ICD-10-CM | POA: Diagnosis not present

## 2018-06-04 MED ORDER — AMLODIPINE BESYLATE 5 MG PO TABS: 5.0000 mg | ORAL_TABLET | Freq: Every day | ORAL | 1 refills | Status: DC

## 2018-06-04 NOTE — Assessment & Plan Note (Signed)
It seems like BP slowly going up after discontinuation of amlodipine. We discussed some side effects of amlodipine, which include edema. He will continue amlodipine 5 mg and Cozaar 100 mg. He will check BP daily, different times. Continue low-salt diet. If BP >+ 140/90, we can consider changing Cozaar for Benicar.

## 2018-06-04 NOTE — Patient Instructions (Addendum)
A few things to remember from today's visit:   Essential hypertension  Bilateral lower extremity edema  Varicose veins of both lower extremities, unspecified whether complicated  For now we will continue amlodipine 5 mg and Cozaar 100 mg. Check blood pressure daily, different times. Continue low-salt diet. Please let me know in a week about blood pressure numbers, if 140/90 or higher we may need to consider changing Cozaar for Benicar.   Lower extremity elevation and compression stockings may help with edema.  Please be sure medication list is accurate. If a new problem present, please set up appointment sooner than planned today.

## 2018-06-04 NOTE — Progress Notes (Signed)
ACUTE VISIT   HPI:  Chief Complaint  Patient presents with  . Leg Swelling    lower legs, feet and ankles for 2 weeks, started after increase dosage of Amlodipine    [name] is a 72 y.o. male, who is here with his wife today complaining of lower extremity edema, which he noted after increasing dose of amlodipine from 5 mg to 10 mg. He discontinued amlodipine about 4 days ago and noted decrease in edema.  He seems to be better in the morning and with lower extremity elevation. Exacerbated by prolonged standing and worse at the end of the day. He has not try OTC treatments.  He also noted some "red spots" scattered on lower extremities. Negative for leg pain, erythema, or wounds. No history of recent travel or surgery. Negative for palpitations, chest pain, dyspnea.  History of hypercoagulable state, currently he is on Coumadin 5 mg daily.   Hypertension: He brought in some BP readings: 133/74, 138/75, 128/75, 121/77, 130/73, 139/79, and 140/80. History of intermittent bradycardia, pulse has been between 58 and 71. He is currently on losartan 100 mg daily.    Review of Systems  Constitutional: Positive for fatigue. Negative for activity change, appetite change and fever.  HENT: Negative for facial swelling, nosebleeds and sore throat.   Respiratory: Negative for cough, shortness of breath and wheezing.   Cardiovascular: Positive for leg swelling. Negative for chest pain and palpitations.  Gastrointestinal: Negative for abdominal pain, nausea and vomiting.  Genitourinary: Negative for decreased urine volume, dysuria and hematuria.  Musculoskeletal: Positive for arthralgias and gait problem.  Skin: Positive for rash. Negative for wound.  Neurological: Negative for syncope and headaches.  Hematological: Negative for adenopathy. Does not bruise/bleed easily.      Current Outpatient Medications on File Prior to Visit  Medication Sig Dispense Refill  .  acetaminophen (TYLENOL) 500 MG tablet Take 500 mg as needed by mouth.    . ezetimibe (ZETIA) 10 MG tablet Take 1 tablet (10 mg total) by mouth at bedtime. 90 tablet 1  . fenofibrate (TRICOR) 145 MG tablet Take 1 tablet (145 mg total) by mouth at bedtime. 90 tablet 1  . losartan (COZAAR) 100 MG tablet Take 1 tablet (100 mg total) by mouth daily. 90 tablet 1  . rosuvastatin (CRESTOR) 40 MG tablet TAKE 1 TABLET AT BEDTIME 90 tablet 2  . warfarin (COUMADIN) 5 MG tablet TAKE 1 TABLET DAILY AT 6 P.M. 90 tablet 1  . nitroGLYCERIN (NITROSTAT) 0.4 MG SL tablet Place 1 tablet (0.4 mg total) every 5 (five) minutes as needed under the tongue for chest pain. 25 tablet 3   No current facility-administered medications on file prior to visit.      Past Medical History:  Diagnosis Date  . Allergy   . Arthritis   . 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 Cataracts, bilateral   . Chicken pox   . Dyslipidemia   . Hyperlipidemia   . Hypertension   . Sinus bradycardia    Allergies  Allergen Reactions  . Other     Environmental allergies    Social History   Socioeconomic History  . Marital status: Married    Spouse name: [name]  . Number of children: 2  . Years of education: COLLEGE  . Highest education level: Not on file  Occupational History  . Occupation: RETIRED  Social Needs  . Financial resource strain: Not on file  .  Food insecurity:    Worry: Not on file    Inability: Not on file  . Transportation needs:    Medical: Not on file    Non-medical: Not on file  Tobacco Use  . Smoking status: Former Smoker    Last attempt to quit: 05/29/1982    Years since quitting: 36.0  . Smokeless tobacco: Never Used  Substance and Sexual Activity  . Alcohol use: Yes    Comment: 0-2 drinks a night  . Drug use: No  . Sexual activity: Not on file  Lifestyle  . Physical activity:    Days per week: Not on file    Minutes per session: Not on file  . Stress: Not on file  Relationships    . Social connections:    Talks on phone: Not on file    Gets together: Not on file    Attends religious service: Not on file    Active member of club or organization: Not on file    Attends meetings of clubs or organizations: Not on file    Relationship status: Not on file  Other Topics Concern  . Not on file  Social History Narrative  . Not on file    Vitals:   06/04/18 1201  BP: 140/80  Pulse: 76  Resp: 16  Temp: 98.7 F (37.1 C)  SpO2: 97%   Body mass index is 26.37 kg/m.    Physical Exam  Nursing note and vitals reviewed. Constitutional: He is oriented to person, place, and time. He appears well-developed. No distress.  HENT:  Head: Normocephalic and atraumatic.  Mouth/Throat: Oropharynx is clear and moist and mucous membranes are normal.  Eyes: Pupils are equal, round, and reactive to light. Conjunctivae are normal.  Cardiovascular: Normal rate and regular rhythm.  No murmur heard. A few varicose veins on distal lower extremities, bilateral. DP pulses present bilaterally. There is no calf tenderness upon palpation, bilateral.  Respiratory: Effort normal and breath sounds normal. No respiratory distress.  Musculoskeletal: He exhibits edema (Periankle 2+ pittign LE edema,bilateral.Pretibial 1+ pitting edema bilateral.). He exhibits no tenderness.  Lymphadenopathy:    He has no cervical adenopathy.  Neurological: He is alert and oriented to person, place, and time. He has normal strength.  Unstable gait assisted with a cane.  Skin: Skin is warm. Petechiae noted. No ecchymosis and no purpura noted. No erythema.  Scattered petechia rash on pretibial area LE,bilateral.   Psychiatric: He has a normal mood and affect.  Well groomed, good eye contact.      ASSESSMENT AND PLAN:   [name] was seen today for leg swelling.  Diagnoses and all orders for this visit:  Bilateral lower extremity edema  We decreased possible etiologies, including varicose veins  as well as medication. Since problem has improved after discontinuation of amlodipine, I do not think further work-up is necessary today. He agrees with resuming amlodipine but lower dose, 5 mg. He will monitor for new symptoms. Lower extremity elevation above waist level may help. Follow-up as needed.  Essential hypertension It seems like BP slowly going up after discontinuation of amlodipine. We discussed some side effects of amlodipine, which include edema. He will continue amlodipine 5 mg and Cozaar 100 mg. He will check BP daily, different times. Continue low-salt diet. If BP >+ 140/90, we can consider changing Cozaar for Benicar.   Varicose veins of both lower extremities Discussed symptoms and treatment options. Compression stockings will help. Lower extremity elevation above waist level a few  times per day may also help.  Hopefully lower extremity edema will resolve after decreasing amlodipine dose.     Return if symptoms worsen or fail to improve, for Keep next appointment..     Betty G. Martinique, MD  [facility]. [place] office.

## 2018-06-04 NOTE — Assessment & Plan Note (Addendum)
Discussed symptoms and treatment options. Compression stockings will help. Lower extremity elevation above waist level a few times per day may also help.  Hopefully lower extremity edema will resolve after decreasing amlodipine dose.

## 2018-06-08 ENCOUNTER — Ambulatory Visit (INDEPENDENT_AMBULATORY_CARE_PROVIDER_SITE_OTHER): Payer: Medicare Other | Admitting: *Deleted

## 2018-06-08 DIAGNOSIS — D6859 Other primary thrombophilia: Secondary | ICD-10-CM | POA: Diagnosis not present

## 2018-06-08 DIAGNOSIS — Z5181 Encounter for therapeutic drug level monitoring: Secondary | ICD-10-CM | POA: Diagnosis not present

## 2018-06-08 LAB — POCT INR: INR: 2.8 (ref 2.0–3.0)

## 2018-06-08 NOTE — Patient Instructions (Addendum)
Description   Continue taking 1/2 tablet on Monday, Wednesday, Fridays and 1 tablet on all other days. Recheck in 5 weeks. Call us with any new medications or concerns [phone] [facility] Main # [phone].

## 2018-06-10 NOTE — Progress Notes (Signed)
I have reviewed documentation from this visit and I agree with recommendations given.  Betty G. Jordan, MD  [facility]. [place] office.   

## 2018-07-13 ENCOUNTER — Ambulatory Visit (INDEPENDENT_AMBULATORY_CARE_PROVIDER_SITE_OTHER): Payer: Medicare Other | Admitting: *Deleted

## 2018-07-13 DIAGNOSIS — Z5181 Encounter for therapeutic drug level monitoring: Secondary | ICD-10-CM | POA: Diagnosis not present

## 2018-07-13 DIAGNOSIS — D6859 Other primary thrombophilia: Secondary | ICD-10-CM | POA: Diagnosis not present

## 2018-07-13 LAB — POCT INR: INR: 2 (ref 2.0–3.0)

## 2018-07-13 NOTE — Patient Instructions (Signed)
Description   Continue taking 1/2 tablet on Monday, Wednesday, Fridays and 1 tablet on all other days. Recheck in 6 weeks. Call us with any new medications or concerns [phone] [facility] Main # [phone].

## 2018-08-24 ENCOUNTER — Ambulatory Visit (INDEPENDENT_AMBULATORY_CARE_PROVIDER_SITE_OTHER): Payer: Medicare Other | Admitting: *Deleted

## 2018-08-24 DIAGNOSIS — Z5181 Encounter for therapeutic drug level monitoring: Secondary | ICD-10-CM

## 2018-08-24 DIAGNOSIS — D6859 Other primary thrombophilia: Secondary | ICD-10-CM | POA: Diagnosis not present

## 2018-08-24 LAB — POCT INR: INR: 2.7 (ref 2.0–3.0)

## 2018-08-24 NOTE — Patient Instructions (Signed)
Description   Continue taking 1/2 tablet on Monday, Wednesday, Fridays and 1 tablet on all other days. Recheck in 6 weeks. Call us with any new medications or concerns [phone] [facility], Main # [phone].

## 2018-09-20 ENCOUNTER — Encounter: Payer: Self-pay | Admitting: Family Medicine

## 2018-09-20 ENCOUNTER — Ambulatory Visit (INDEPENDENT_AMBULATORY_CARE_PROVIDER_SITE_OTHER): Payer: Medicare Other | Admitting: Family Medicine

## 2018-09-20 ENCOUNTER — Ambulatory Visit: Payer: Medicare Other | Admitting: Family Medicine

## 2018-09-20 VITALS — BP 132/84 | HR 60 | Temp 98.0°F | Resp 16 | Ht 74.0 in | Wt 210.4 lb

## 2018-09-20 DIAGNOSIS — M2550 Pain in unspecified joint: Secondary | ICD-10-CM

## 2018-09-20 DIAGNOSIS — Z23 Encounter for immunization: Secondary | ICD-10-CM | POA: Diagnosis not present

## 2018-09-20 DIAGNOSIS — M159 Polyosteoarthritis, unspecified: Secondary | ICD-10-CM | POA: Insufficient documentation

## 2018-09-20 DIAGNOSIS — I10 Essential (primary) hypertension: Secondary | ICD-10-CM

## 2018-09-20 DIAGNOSIS — E559 Vitamin D deficiency, unspecified: Secondary | ICD-10-CM | POA: Diagnosis not present

## 2018-09-20 DIAGNOSIS — R2681 Unsteadiness on feet: Secondary | ICD-10-CM

## 2018-09-20 LAB — BASIC METABOLIC PANEL
BUN: 22 mg/dL (ref 6–23)
CHLORIDE: 105 meq/L (ref 96–112)
CO2: 31 mEq/L (ref 19–32)
CREATININE: 0.98 mg/dL (ref 0.40–1.50)
Calcium: 11 mg/dL — ABNORMAL HIGH (ref 8.4–10.5)
GFR: 80.16 mL/min (ref >60.00)
Glucose, Bld: 93 mg/dL (ref 70–99)
Potassium: 4.3 mEq/L (ref 3.5–5.1)
Sodium: 140 mEq/L (ref 135–145)

## 2018-09-20 LAB — C-REACTIVE PROTEIN: CRP: 0.1 mg/dL — ABNORMAL LOW (ref 0.5–20.0)

## 2018-09-20 LAB — VITAMIN D 25 HYDROXY (VIT D DEFICIENCY, FRACTURES): VITD: 50.77 ng/mL (ref 30.00–100.00)

## 2018-09-20 LAB — SEDIMENTATION RATE: SED RATE: 2 mm/h (ref 0–20)

## 2018-09-20 NOTE — Progress Notes (Signed)
HPI:   [name] is a 72 y.o. male, who is here today with his wife for 6 months follow up.   He was last seen on 06/04/18 for acute visit, LE edema.   Since his last visit he has followed with coumadin clinic.   Hypertension:   Currently on losartan 100 mg and amlodipine 5 mg daily, meds were adjusted last visit due to edema. Home BP readings 130's/70's and HR 60-70's.  He is taking medications as instructed, no side effects reported.  He has not noted unusual headache, visual changes, exertional chest pain, dyspnea,  focal weakness, or edema.   Lab Results  Component Value Date   CREATININE 0.91 03/29/2018   BUN 16 03/29/2018   NA 143 03/29/2018   K 4.2 03/29/2018   CL 108 03/29/2018   CO2 27 03/29/2018    Bilateral lower extremity edema, has improved greatly.LLE edema results 3-4 days after last visit, still having mild edema of right peri ankle area. Edema seems to be better in the morning and exacerbated by prolonged standing/walking.  Denies pain on LE's,,local edema or erythema.  He has Hx of unstable gait, his wife think it may be getting worse. He is holding walls and furniture more often, still using his cane. No recent falls.  Bilateral hip pain and lower back pain worsening for the past 2 months.  He follows with chiropractor, treatments help some.  Generalized OA:  Lower and upper back pain "from neck to waist." Also pain in hips knees and ankle. No joint edema or erythema. Back pain is intermittent,8/10. exacerbated by walking and prolonged standing.  His wife wonders if he needs to have a DEXA to evaluate for osteoporosis.   Vit D deficiency and hyperCa++ he resumed vit D supplementation, OTC Vit D 1000 U daily.  Review of Systems  Constitutional: Negative for activity change, appetite change, fatigue and fever.  HENT: Negative for nosebleeds, sore throat and trouble swallowing.   Eyes: Negative for redness and visual  disturbance.  Respiratory: Negative for cough, shortness of breath and wheezing.   Cardiovascular: Positive for leg swelling. Negative for chest pain and palpitations.  Gastrointestinal: Negative for abdominal pain, nausea and vomiting.  Genitourinary: Negative for decreased urine volume, dysuria and hematuria.  Musculoskeletal: Positive for arthralgias, back pain, gait problem and neck pain. Negative for joint swelling.  Skin: Negative for rash and wound.  Neurological: Negative for syncope, weakness and headaches.  Psychiatric/Behavioral: Negative for confusion. The patient is nervous/anxious.      Current Outpatient Medications on File Prior to Visit  Medication Sig Dispense Refill  . acetaminophen (TYLENOL) 500 MG tablet Take 500 mg as needed by mouth.    Marland Kitchen amLODipine (NORVASC) 5 MG tablet Take 1 tablet (5 mg total) by mouth daily. 90 tablet 1  . cholecalciferol (VITAMIN D) 1000 units tablet 1,000 Units daily.     . Cyanocobalamin (VITAMIN B12) 500 MCG TABS 2 (two) times daily.     Marland Kitchen ezetimibe (ZETIA) 10 MG tablet Take 1 tablet (10 mg total) by mouth at bedtime. 90 tablet 1  . fenofibrate (TRICOR) 145 MG tablet Take 1 tablet (145 mg total) by mouth at bedtime. 90 tablet 1  . losartan (COZAAR) 100 MG tablet Take 1 tablet (100 mg total) by mouth daily. 90 tablet 1  . rosuvastatin (CRESTOR) 40 MG tablet TAKE 1 TABLET AT BEDTIME 90 tablet 2  . warfarin (COUMADIN) 5 MG tablet TAKE 1 TABLET DAILY  AT 6 P.M. 90 tablet 1  . nitroGLYCERIN (NITROSTAT) 0.4 MG SL tablet Place 1 tablet (0.4 mg total) every 5 (five) minutes as needed under the tongue for chest pain. 25 tablet 3   No current facility-administered medications on file prior to visit.      Past Medical History:  Diagnosis Date  . Allergy   . Arthritis   . 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 Cataracts, bilateral   . Chicken pox   . Dyslipidemia   . Hyperlipidemia   . Hypertension   . Sinus bradycardia     Allergies  Allergen Reactions  . Other     Environmental allergies    Social History   Socioeconomic History  . Marital status: Married    Spouse name: [name]  . Number of children: 2  . Years of education: COLLEGE  . Highest education level: Not on file  Occupational History  . Occupation: RETIRED  Social Needs  . Financial resource strain: Not on file  . Food insecurity:    Worry: Not on file    Inability: Not on file  . Transportation needs:    Medical: Not on file    Non-medical: Not on file  Tobacco Use  . Smoking status: Former Smoker    Last attempt to quit: 05/29/1982    Years since quitting: 36.3  . Smokeless tobacco: Never Used  Substance and Sexual Activity  . Alcohol use: Yes    Comment: 0-2 drinks a night  . Drug use: No  . Sexual activity: Not on file  Lifestyle  . Physical activity:    Days per week: Not on file    Minutes per session: Not on file  . Stress: Not on file  Relationships  . Social connections:    Talks on phone: Not on file    Gets together: Not on file    Attends religious service: Not on file    Active member of club or organization: Not on file    Attends meetings of clubs or organizations: Not on file    Relationship status: Not on file  Other Topics Concern  . Not on file  Social History Narrative  . Not on file    Vitals:   09/20/18 1418  BP: 132/84  Pulse: 60  Resp: 16  Temp: 98 F (36.7 C)  SpO2: 97%   Body mass index is 27.01 kg/m.   Physical Exam  Nursing note and vitals reviewed. Constitutional: He is oriented to person, place, and time. He appears well-developed. No distress.  HENT:  Head: Normocephalic and atraumatic.  Mouth/Throat: Oropharynx is clear and moist and mucous membranes are normal.  Eyes: Pupils are equal, round, and reactive to light. Conjunctivae are normal.  Cardiovascular: Normal rate and regular rhythm.  No murmur heard. Pulses:      Dorsalis pedis pulses are 2+ on the right side,  and 2+ on the left side.  Respiratory: Effort normal and breath sounds normal. No respiratory distress.  GI: Soft. He exhibits no mass. There is no hepatomegaly. There is no tenderness.  Musculoskeletal: He exhibits no edema.       Cervical back: He exhibits no tenderness and no bony tenderness.       Thoracic back: He exhibits tenderness. He exhibits no bony tenderness.       Lumbar back: He exhibits tenderness. He exhibits no bony tenderness.       Back:  Lymphadenopathy:  He has no cervical adenopathy.  Neurological: He is alert and oriented to person, place, and time. He has normal strength. No cranial nerve deficit. Gait abnormal.  Unstable gait assisted with a cane.  Skin: Skin is warm. No rash noted. No erythema.  Psychiatric: He has a normal mood and affect. Cognition and memory are normal.  Well groomed, good eye contact.     ASSESSMENT AND PLAN:   Mr. BOGDAN VIVONA was seen today for 6 months follow-up.  Orders Placed This Encounter  Procedures  . Flu Vaccine QUAD 36+ mos IM  . Basic metabolic panel  . VITAMIN D 25 Hydroxy (Vit-D Deficiency, Fractures)  . Sedimentation rate  . C-reactive protein   Lab Results  Component Value Date   ESRSEDRATE 2 09/20/2018   Lab Results  Component Value Date   CRP 0.1 (L) 09/20/2018   Lab Results  Component Value Date   CREATININE 0.98 09/20/2018   BUN 22 09/20/2018   NA 140 09/20/2018   K 4.3 09/20/2018   CL 105 09/20/2018   CO2 31 09/20/2018    Polyarthralgia We discussed possible etiologies, most likely OA. ESR and CRP ordered. Fall prevention. Further recommendations will be given according to lab results. He is not interested in ortho evaluation for now,he will let me know if he decides to do so.  We discussed current recommendations in regard to osteoporosis screening in man, he decides to hold on DEXA for now.  Continue following with chiropractor for back and hip pain.  Generalized osteoarthrosis,  involving multiple sites Educated about Dx,prognosis,and treatment options. Tylenol 500 mg tid may help. Tumeric with back pepper.   Essential hypertension Adequately controlled. No changes in current management. DASH and low salt diet recommended. Eye exam recommended annually. F/U in 6 months, before   Vitamin D deficiency, unspecified No changes in current management, will follow labs done today and will give further recommendations accordingly.   Unstable gait Fall precautions discussed. Continue using his cane.      Torii Royse G. Martinique, MD  [facility]. [place] office.

## 2018-09-20 NOTE — Assessment & Plan Note (Signed)
Educated about Dx,prognosis,and treatment options. Tylenol 500 mg tid may help. Tumeric with back pepper.

## 2018-09-20 NOTE — Assessment & Plan Note (Signed)
Adequately controlled. No changes in current management. DASH and low salt diet recommended. Eye exam recommended annually. F/U in 6 months, before

## 2018-09-20 NOTE — Assessment & Plan Note (Signed)
Fall precautions discussed. Continue using his cane.

## 2018-09-20 NOTE — Assessment & Plan Note (Signed)
No changes in current management, will follow labs done today and will give further recommendations accordingly.  

## 2018-09-20 NOTE — Patient Instructions (Addendum)
A few things to remember from today's visit:   Essential hypertension - Plan: Basic metabolic panel  Generalized osteoarthrosis, involving multiple sites  Vitamin D deficiency, unspecified - Plan: VITAMIN D 25 Hydroxy (Vit-D Deficiency, Fractures)  Unstable gait  Polyarthralgia - Plan: Sedimentation rate, C-reactive protein  Osteoarthritis is a chronic condition and gets worse with age.  The following may help:  Over the counter topical medications: Icy Hot or Asper cream with Lidocaine. Tai Chi or PT. Fall prevention. Avoid weight gain. Fish oil, over the counter Megared for example, 2 capsules daily.    Please be sure medication list is accurate. If a new problem present, please set up appointment sooner than planned today.        

## 2018-09-23 ENCOUNTER — Encounter: Payer: Self-pay | Admitting: Family Medicine

## 2018-09-25 ENCOUNTER — Other Ambulatory Visit: Payer: Self-pay | Admitting: Family Medicine

## 2018-10-05 ENCOUNTER — Ambulatory Visit (INDEPENDENT_AMBULATORY_CARE_PROVIDER_SITE_OTHER): Payer: Medicare Other | Admitting: Pharmacist

## 2018-10-05 DIAGNOSIS — Z5181 Encounter for therapeutic drug level monitoring: Secondary | ICD-10-CM

## 2018-10-05 DIAGNOSIS — D6859 Other primary thrombophilia: Secondary | ICD-10-CM | POA: Diagnosis not present

## 2018-10-05 LAB — POCT INR: INR: 3 (ref 2.0–3.0)

## 2018-10-05 NOTE — Patient Instructions (Signed)
Description   Continue taking 1/2 tablet on Monday, Wednesday, Fridays and 1 tablet on all other days. Recheck in 6 weeks. Call us with any new medications or concerns [phone] [facility], Main # [phone].

## 2018-11-12 DIAGNOSIS — H25012 Cortical age-related cataract, left eye: Secondary | ICD-10-CM | POA: Diagnosis not present

## 2018-11-12 DIAGNOSIS — H2512 Age-related nuclear cataract, left eye: Secondary | ICD-10-CM | POA: Diagnosis not present

## 2018-11-12 DIAGNOSIS — Z961 Presence of intraocular lens: Secondary | ICD-10-CM | POA: Diagnosis not present

## 2018-11-12 DIAGNOSIS — H35033 Hypertensive retinopathy, bilateral: Secondary | ICD-10-CM | POA: Diagnosis not present

## 2018-11-16 ENCOUNTER — Ambulatory Visit (INDEPENDENT_AMBULATORY_CARE_PROVIDER_SITE_OTHER): Payer: Medicare Other | Admitting: *Deleted

## 2018-11-16 DIAGNOSIS — D6859 Other primary thrombophilia: Secondary | ICD-10-CM

## 2018-11-16 DIAGNOSIS — Z5181 Encounter for therapeutic drug level monitoring: Secondary | ICD-10-CM

## 2018-11-16 LAB — POCT INR: INR: 1.7 — AB (ref 2.0–3.0)

## 2018-11-16 NOTE — Patient Instructions (Signed)
Description   Today take 1.5 tablets then continue taking 1/2 tablet on Monday, Wednesday, Fridays and 1 tablet on all other days. Recheck in 3-4 weeks. Call us with any new medications or concerns [phone] [facility], Main # [phone].

## 2018-11-24 ENCOUNTER — Other Ambulatory Visit: Payer: Self-pay | Admitting: Interventional Cardiology

## 2018-11-29 ENCOUNTER — Other Ambulatory Visit: Payer: Self-pay | Admitting: Interventional Cardiology

## 2018-12-01 ENCOUNTER — Other Ambulatory Visit: Payer: Self-pay | Admitting: Family Medicine

## 2018-12-01 DIAGNOSIS — I10 Essential (primary) hypertension: Secondary | ICD-10-CM

## 2018-12-10 ENCOUNTER — Other Ambulatory Visit: Payer: Self-pay | Admitting: Interventional Cardiology

## 2018-12-10 NOTE — Progress Notes (Signed)
Cardiology Office Note   Date:  12/13/2018   ID:  [name], [name] December 16, 1945, MRN [id number]  PCP:  Martinique, Betty G, MD    No chief complaint on file.  CAD  Wt Readings from Last 3 Encounters:  12/13/18 95.4 kg  09/20/18 95.4 kg  06/04/18 93.2 kg       History of Present Illness: [name] is a 73 y.o. male   who had CABG in 1996. He was being treated for HTN at the time. He was on vacation and was cleaning and felt severe chest pain with activity. He failed an ETT. He had a cath showing 3 vessel disease and had CABG x 3 in 1996 ( LIMA was used.). Since then, he has not had a cath. He continued rehab for an extended period of time. 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  He moved to [place] in Jan 2018 from [place].   He uses a cane and walker to walk due to instability. He has completed a physical therapy program.  Quit smoking 35 years ago.   He was diagnosed with a hypercoagulable state at the time of CABG and he has been on Coumadin since that time. Nose bleed when INR increased. He was found to have a markedly elevated fibrinopeptideAlevel, mild resistance to activated protein C and relative aspirin resistance with respect to ADP-induced platelet aggregation.  He was seen at [facility] for his neuropathy.  He has DJD of the spine.  He goes to the chiropracter.  His biggest issue is his back.  He was referred to a massage therapist.   Denies : Chest pain. Dizziness. Leg edema. Nitroglycerin use. Orthopnea. Palpitations. Paroxysmal nocturnal dyspnea. Shortness of breath. Syncope.   Occasional nose bleed in the right nostril. No trips to ER for bleeding.  INR has been stable, except for recently.  Had a 3.1 reading today.  He has been walking regularly.  He feels well with walking.  Has some neck pain.  Joint pains have been getting worse and limiting activity.     Past Medical History:    Diagnosis Date  . Allergy   . Arthritis   . 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 Cataracts, bilateral   . Chicken pox   . Dyslipidemia   . Hyperlipidemia   . Hypertension   . Sinus bradycardia     Past Surgical History:  Procedure Laterality Date  . BYPASS GRAFT  1996  . CATARACT EXTRACTION Right   . REFRACTIVE SURGERY Right   . SKIN BIOPSY    . UMBILICAL HERNIA REPAIR  2003     Current Outpatient Medications  Medication Sig Dispense Refill  . acetaminophen (TYLENOL) 500 MG tablet Take 500 mg as needed by mouth.    Marland Kitchen amLODipine (NORVASC) 5 MG tablet TAKE 1 TABLET DAILY 90 tablet 4  . cholecalciferol (VITAMIN D) 1000 units tablet 1,000 Units daily.     . Cyanocobalamin (VITAMIN B12) 500 MCG TABS 2 (two) times daily.     Marland Kitchen ezetimibe (ZETIA) 10 MG tablet Take 1 tablet (10 mg total) by mouth at bedtime. 90 tablet 1  . fenofibrate (TRICOR) 145 MG tablet TAKE 1 TABLET AT BEDTIME 90 tablet 1  . losartan (COZAAR) 100 MG tablet TAKE 1 TABLET DAILY 90 tablet 1  . nitroGLYCERIN (NITROSTAT) 0.4 MG SL tablet Place 1 tablet (0.4 mg total) every 5 (five) minutes as needed  under the tongue for chest pain. 25 tablet 3  . rosuvastatin (CRESTOR) 40 MG tablet Take 1 tablet (40 mg total) by mouth at bedtime. Please keep upcoming appt for future refills. Thank you 90 tablet 0  . warfarin (COUMADIN) 5 MG tablet TAKE 1 TABLET DAILY AT 6 P.M. (Patient taking differently: Take 5 mg by mouth daily. Take half tablet 2.5 mg Mon, wed, and Fri. Take whole tab remaining days.) 90 tablet 1   No current facility-administered medications for this visit.     Allergies:   Other    Social History:  The patient  reports that he quit smoking about 36 years ago. He has never used smokeless tobacco. He reports current alcohol use. He reports that he does not use drugs.   Family History:  The patient's family history includes AAA (abdominal aortic aneurysm) in his sister; Alzheimer's disease in his  mother; Cancer in his father; Hypertension in his maternal grandmother and paternal grandmother; Prostate cancer in his father.    ROS:  Please see the history of present illness.   Otherwise, review of systems are positive for back pain/joint pain.   All other systems are reviewed and negative.    PHYSICAL EXAM: VS:  BP 136/72   Pulse 62   Ht 6\' 2"  (1.88 m)   Wt 95.4 kg   SpO2 98%   BMI 27.01 kg/m  , BMI Body mass index is 27.01 kg/m. GEN: Well nourished, well developed, in no acute distress  HEENT: normal  Neck: no JVD, carotid bruits, or masses Cardiac: RRR; no murmurs, rubs, or gallops,no edema  Respiratory:  clear to auscultation bilaterally, normal work of breathing GI: soft, nontender, nondistended, + BS MS: no deformity or atrophy  Skin: warm and dry, no rash Neuro:  Strength and sensation are intact Psych: euthymic mood, full affect   EKG:   The ekg ordered today demonstrates NSR, no ST changes, QRS slightly wider than previous   Recent Labs: 03/29/2018: ALT 19 09/20/2018: BUN 22; Creatinine, Ser 0.98; Potassium 4.3; Sodium 140   Lipid Panel    Component Value Date/Time   CHOL 130 03/29/2018 0929   CHOL 118 02/10/2017 0835   TRIG 84.0 03/29/2018 0929   HDL 47.10 03/29/2018 0929   HDL 56 02/10/2017 0835   CHOLHDL 3 03/29/2018 0929   VLDL 16.8 03/29/2018 0929   LDLCALC 66 03/29/2018 0929   LDLCALC 45 02/10/2017 0835     Other studies Reviewed: Additional studies/ records that were reviewed today with results demonstrating: Labs reviewed.   ASSESSMENT AND PLAN:  1. CAD: No angina.  Continue aggressive secondary prevention. 2. Hyperlipidemia: Lipids well controlled at last check in April 2019.  Continue current lipid-lowering therapy. 3. Anticoagulated: Tolerating warfarin.  Managed in the Coumadin clinic.  Occasional nosebleeds can be stopped at home without any trip to the emergency room thus far. 4. HTN: The current medical regimen is effective;   continue present plan and medications. 5. We spoke about eating a healthy diet.  He does tend to eat carbs regularly.  We talked about trying to decrease carbohydrate intake.   Current medicines are reviewed at length with the patient today.  The patient concerns regarding his medicines were addressed.  The following changes have been made:  No change  Labs/ tests ordered today include:  No orders of the defined types were placed in this encounter.   Recommend 150 minutes/week of aerobic exercise Low fat, low carb, high fiber diet recommended  Disposition:   FU in 1 year   Signed, Larae Grooms, MD  12/13/2018 9:29 AM    [facility] [address], [place], [place]  [address] Phone: [phone]; Fax: [phone]

## 2018-12-13 ENCOUNTER — Ambulatory Visit (INDEPENDENT_AMBULATORY_CARE_PROVIDER_SITE_OTHER): Payer: Medicare Other | Admitting: Interventional Cardiology

## 2018-12-13 ENCOUNTER — Other Ambulatory Visit: Payer: Self-pay | Admitting: Interventional Cardiology

## 2018-12-13 ENCOUNTER — Encounter: Payer: Self-pay | Admitting: Interventional Cardiology

## 2018-12-13 ENCOUNTER — Ambulatory Visit (INDEPENDENT_AMBULATORY_CARE_PROVIDER_SITE_OTHER): Payer: Medicare Other | Admitting: *Deleted

## 2018-12-13 VITALS — BP 136/72 | HR 62 | Ht 74.0 in | Wt 210.4 lb

## 2018-12-13 DIAGNOSIS — Z7901 Long term (current) use of anticoagulants: Secondary | ICD-10-CM

## 2018-12-13 DIAGNOSIS — I25119 Atherosclerotic heart disease of native coronary artery with unspecified angina pectoris: Secondary | ICD-10-CM | POA: Diagnosis not present

## 2018-12-13 DIAGNOSIS — D6859 Other primary thrombophilia: Secondary | ICD-10-CM | POA: Diagnosis not present

## 2018-12-13 DIAGNOSIS — E782 Mixed hyperlipidemia: Secondary | ICD-10-CM | POA: Diagnosis not present

## 2018-12-13 DIAGNOSIS — Z5181 Encounter for therapeutic drug level monitoring: Secondary | ICD-10-CM

## 2018-12-13 DIAGNOSIS — I10 Essential (primary) hypertension: Secondary | ICD-10-CM | POA: Diagnosis not present

## 2018-12-13 LAB — POCT INR: INR: 3.1 — AB (ref 2.0–3.0)

## 2018-12-13 NOTE — Patient Instructions (Signed)

## 2018-12-13 NOTE — Patient Instructions (Signed)
Description   Tomorrow take 1/2 tablet then continue taking 1/2 tablet on Monday, Wednesday, Fridays and 1 tablet on all other days. Recheck in 3 weeks. Call us with any new medications or concerns [phone] [facility], Main # [phone].

## 2019-01-03 ENCOUNTER — Ambulatory Visit (INDEPENDENT_AMBULATORY_CARE_PROVIDER_SITE_OTHER): Payer: Medicare Other

## 2019-01-03 DIAGNOSIS — D6859 Other primary thrombophilia: Secondary | ICD-10-CM

## 2019-01-03 DIAGNOSIS — Z5181 Encounter for therapeutic drug level monitoring: Secondary | ICD-10-CM | POA: Diagnosis not present

## 2019-01-03 LAB — POCT INR: INR: 2.3 (ref 2.0–3.0)

## 2019-01-03 NOTE — Patient Instructions (Signed)
Description   Continue on same dosage 1 tablet daily except 1/2 tablet on Mondays, Wednesdays, and Fridays. Recheck in 4 weeks. Call us with any new medications or concerns [phone] [facility], Main # [phone].

## 2019-01-31 ENCOUNTER — Ambulatory Visit (INDEPENDENT_AMBULATORY_CARE_PROVIDER_SITE_OTHER): Payer: Medicare Other | Admitting: Pharmacist

## 2019-01-31 DIAGNOSIS — Z5181 Encounter for therapeutic drug level monitoring: Secondary | ICD-10-CM | POA: Diagnosis not present

## 2019-01-31 DIAGNOSIS — D6859 Other primary thrombophilia: Secondary | ICD-10-CM | POA: Diagnosis not present

## 2019-01-31 LAB — POCT INR: INR: 2.5 (ref 2.0–3.0)

## 2019-01-31 NOTE — Patient Instructions (Signed)
Description   Continue on same dosage 1 tablet daily except 1/2 tablet on Mondays, Wednesdays, and Fridays. Recheck in 6 weeks. Call us with any new medications or concerns [phone] [facility], Main # [phone].

## 2019-03-03 ENCOUNTER — Telehealth: Payer: Self-pay

## 2019-03-03 NOTE — Telephone Encounter (Signed)
Author phoned pt. to offer virtual awv. Pt. spoke with wife, who stated that pt. Would be open to converting already scheduled CPE with PCP to virtual wellness visit, enquiring need to come into clinic still for labs. Author told wife that she would route concern to Dr. Martinique to clarify her preference. In meantime, AWV appointment changed; pt. Will need webex invite and instructions as appointment approaches. E-mail confirmed.

## 2019-03-04 NOTE — Telephone Encounter (Signed)
Virtual visit scheduled for 03/29/2019 at 9 am.

## 2019-03-04 NOTE — Telephone Encounter (Signed)
Keep appt in 03/2019. We can do a AWV and f/u through WebEx.  She needs to monitor BP's and we can arrange lab appt later if needed.  Thanks, BJ

## 2019-03-10 ENCOUNTER — Telehealth: Payer: Self-pay

## 2019-03-10 NOTE — Telephone Encounter (Signed)
lmom for prescreen/drive thru 

## 2019-03-11 DIAGNOSIS — L570 Actinic keratosis: Secondary | ICD-10-CM | POA: Diagnosis not present

## 2019-03-11 DIAGNOSIS — C4442 Squamous cell carcinoma of skin of scalp and neck: Secondary | ICD-10-CM | POA: Diagnosis not present

## 2019-03-11 DIAGNOSIS — Z85828 Personal history of other malignant neoplasm of skin: Secondary | ICD-10-CM | POA: Diagnosis not present

## 2019-03-11 DIAGNOSIS — D1801 Hemangioma of skin and subcutaneous tissue: Secondary | ICD-10-CM | POA: Diagnosis not present

## 2019-03-11 DIAGNOSIS — D229 Melanocytic nevi, unspecified: Secondary | ICD-10-CM | POA: Diagnosis not present

## 2019-03-11 DIAGNOSIS — L814 Other melanin hyperpigmentation: Secondary | ICD-10-CM | POA: Diagnosis not present

## 2019-03-11 DIAGNOSIS — D485 Neoplasm of uncertain behavior of skin: Secondary | ICD-10-CM | POA: Diagnosis not present

## 2019-03-11 DIAGNOSIS — L718 Other rosacea: Secondary | ICD-10-CM | POA: Diagnosis not present

## 2019-03-11 NOTE — Telephone Encounter (Signed)

## 2019-03-11 NOTE — Telephone Encounter (Signed)
2nd msg for prescreen/drive thru

## 2019-03-12 ENCOUNTER — Other Ambulatory Visit: Payer: Self-pay | Admitting: Interventional Cardiology

## 2019-03-14 ENCOUNTER — Ambulatory Visit (INDEPENDENT_AMBULATORY_CARE_PROVIDER_SITE_OTHER): Payer: Medicare Other | Admitting: Pharmacist Clinician (PhC)/ Clinical Pharmacy Specialist

## 2019-03-14 ENCOUNTER — Other Ambulatory Visit: Payer: Self-pay

## 2019-03-14 DIAGNOSIS — Z5181 Encounter for therapeutic drug level monitoring: Secondary | ICD-10-CM

## 2019-03-14 DIAGNOSIS — D6859 Other primary thrombophilia: Secondary | ICD-10-CM

## 2019-03-14 LAB — POCT INR: INR: 2.5 (ref 2.0–3.0)

## 2019-03-17 DIAGNOSIS — C4442 Squamous cell carcinoma of skin of scalp and neck: Secondary | ICD-10-CM | POA: Diagnosis not present

## 2019-03-26 ENCOUNTER — Other Ambulatory Visit: Payer: Self-pay | Admitting: Family Medicine

## 2019-03-29 ENCOUNTER — Ambulatory Visit (INDEPENDENT_AMBULATORY_CARE_PROVIDER_SITE_OTHER): Payer: Medicare Other | Admitting: Family Medicine

## 2019-03-29 ENCOUNTER — Ambulatory Visit: Payer: Medicare Other

## 2019-03-29 ENCOUNTER — Other Ambulatory Visit: Payer: Self-pay

## 2019-03-29 ENCOUNTER — Encounter: Payer: Self-pay | Admitting: Family Medicine

## 2019-03-29 VITALS — BP 124/75 | HR 66 | Resp 12

## 2019-03-29 DIAGNOSIS — Z0000 Encounter for general adult medical examination without abnormal findings: Secondary | ICD-10-CM

## 2019-03-29 DIAGNOSIS — I10 Essential (primary) hypertension: Secondary | ICD-10-CM

## 2019-03-29 DIAGNOSIS — R351 Nocturia: Secondary | ICD-10-CM | POA: Diagnosis not present

## 2019-03-29 DIAGNOSIS — E782 Mixed hyperlipidemia: Secondary | ICD-10-CM

## 2019-03-29 DIAGNOSIS — Z125 Encounter for screening for malignant neoplasm of prostate: Secondary | ICD-10-CM | POA: Diagnosis not present

## 2019-03-29 DIAGNOSIS — Z136 Encounter for screening for cardiovascular disorders: Secondary | ICD-10-CM

## 2019-03-29 DIAGNOSIS — Z1211 Encounter for screening for malignant neoplasm of colon: Secondary | ICD-10-CM | POA: Diagnosis not present

## 2019-03-29 DIAGNOSIS — Z87891 Personal history of nicotine dependence: Secondary | ICD-10-CM | POA: Diagnosis not present

## 2019-03-29 DIAGNOSIS — E559 Vitamin D deficiency, unspecified: Secondary | ICD-10-CM | POA: Diagnosis not present

## 2019-03-29 DIAGNOSIS — I25119 Atherosclerotic heart disease of native coronary artery with unspecified angina pectoris: Secondary | ICD-10-CM | POA: Diagnosis not present

## 2019-03-29 MED ORDER — LOSARTAN POTASSIUM 100 MG PO TABS: 100.0000 mg | ORAL_TABLET | Freq: Every day | ORAL | 2 refills | Status: DC

## 2019-03-29 NOTE — Assessment & Plan Note (Signed)
No changes in current management. We will plan on checking lipid panel once it is safe to bring him to the clinic. Continue low-fat diet.

## 2019-03-29 NOTE — Assessment & Plan Note (Signed)
Continue vitamin D 1000 units daily. We will plan on checking 25 OH vitamin D with next labs.

## 2019-03-29 NOTE — Assessment & Plan Note (Signed)
AAA screening will be arranged.

## 2019-03-29 NOTE — Progress Notes (Addendum)
Virtual Visit via Video Note   I connected with Mr Ferrick and his wife on 03/29/19 at  9:00 AM EDT by a video enabled telemedicine application and verified that I am speaking with the correct person using two identifiers.  Location patient: home Location provider:home office Persons participating in the virtual visit: patient, provider  I discussed the limitations of evaluation and management by telemedicine and the availability of in person appointments. He expressed understanding and agreed to proceed.  Chief Complaint  Patient presents with  . Medicare Wellness  . Follow-up    HPI:  Mr Daughdrill is a 73 yo male with Hx of HLD,HTN,unstable gait,Vit D deficiency,OA,peripheral neuropathy,and CAD among some. He is being seen today for Medicare preventive visit and chronic disease management.  He lives with his wife.. Independent for most ADL's and IADL's. He uses a cane and walker intermittently for transferee.  No falls in the past year and denies depression symptoms.  Functional Status Survey: Is the patient deaf or have difficulty hearing?: No Does the patient have difficulty seeing, even when wearing glasses/contacts?: No Does the patient have difficulty concentrating, remembering, or making decisions?: No Does the patient have difficulty walking or climbing stairs?: Yes Does the patient have difficulty dressing or bathing?: No Does the patient have difficulty doing errands alone such as visiting a doctor's office or shopping?: Yes  Fall Risk  03/29/2019 03/23/2018 08/17/2017 05/29/2017  Falls in the past year? 0 No No No  Number falls in past yr: 0 - - -  Injury with Fall? 0 - - -  Risk for fall due to : Impaired balance/gait - - -  Follow up Education provided - - -     Providers he sees regularly:  Eye care provider: Dr Herbert Deaner Cardiologist, Dr Irish Lack. Chiropractor,Dr Saloma.Last OV 5-6 weeks ago. Dermatologist, annual follow ups. Neurologist,not longer  following.  Depression screen [facility] 2/9 03/29/2019  Decreased Interest 0  Down, Depressed, Hopeless 0  PHQ - 2 Score 0    Mini-Cog - 03/29/19 0953    Normal clock drawing test?  yes    How many words correct?  3      Colonoscopy is due in 11/2019. Lab Results  Component Value Date   PSA 0.50 03/27/2017  Nocturia x 1. Denies dysuria,increased urinary frequency, gross hematuria,or decreased urine output.   Vision Screening Comments: Virtual visit   Hyperlipidemia: Currently on Crestor 40 mg daily,tricor 145 mg,and Zetia 10 mg daily. Following a low fat diet: Yes..  He has not noted side effects with medication.  Lab Results  Component Value Date   CHOL 130 03/29/2018   HDL 47.10 03/29/2018   LDLCALC 66 03/29/2018   TRIG 84.0 03/29/2018   CHOLHDL 3 03/29/2018    Hypertension:  Currently on Amlodipine 5 mg daily and Losartan 1000 mg daily. He is taking medications as instructed, no side effects reported. He has not noted unusual headache, visual changes, exertional chest pain, dyspnea,  focal weakness, or edema.  CAD, s/p CABG in 1996. Hypercoagulable states and has been on Coumadin since 2016. INR q 8 weeks,last one 2.5.  Lab Results  Component Value Date   CREATININE 0.98 09/20/2018   BUN 22 09/20/2018   NA 140 09/20/2018   K 4.3 09/20/2018   CL 105 09/20/2018   CO2 31 09/20/2018    Vit D deficiency,he is on Vit D 1000 U daily. He takes tylenol 500 mg bid.  Since his last OV he had skin lesion removed,SCC.  He is also applying topical treatment for scalp AC lesions.  ROS: See pertinent positives and negatives per HPI. COVID-19 screening questions: Denies new fever,cough,sore throat,or possible exposure to COVID-19. Negative for loss in the sense of smell or taste.   Past Medical History:  Diagnosis Date  . Allergy   . Arthritis   . 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 Cataracts, bilateral   . Chicken pox   . Dyslipidemia   .  Hyperlipidemia   . Hypertension   . Sinus bradycardia     Past Surgical History:  Procedure Laterality Date  . BYPASS GRAFT  1996  . CATARACT EXTRACTION Right   . REFRACTIVE SURGERY Right   . SKIN BIOPSY    . UMBILICAL HERNIA REPAIR  2003    Family History  Problem Relation Age of Onset  . Alzheimer's disease Mother   . Cancer Father        prostate  . Prostate cancer Father   . AAA (abdominal aortic aneurysm) Sister   . Hypertension Maternal Grandmother   . Hypertension Paternal Grandmother     Social History   Socioeconomic History  . Marital status: Married    Spouse name: [name]  . Number of children: 2  . Years of education: COLLEGE  . Highest education level: Not on file  Occupational History  . Occupation: RETIRED  Social Needs  . Financial resource strain: Not on file  . Food insecurity:    Worry: Not on file    Inability: Not on file  . Transportation needs:    Medical: Not on file    Non-medical: Not on file  Tobacco Use  . Smoking status: Former Smoker    Last attempt to quit: 05/29/1982    Years since quitting: 36.8  . Smokeless tobacco: Never Used  Substance and Sexual Activity  . Alcohol use: Yes    Comment: 0-2 drinks a night  . Drug use: No  . Sexual activity: Not on file  Lifestyle  . Physical activity:    Days per week: Not on file    Minutes per session: Not on file  . Stress: Not on file  Relationships  . Social connections:    Talks on phone: Not on file    Gets together: Not on file    Attends religious service: Not on file    Active member of club or organization: Not on file    Attends meetings of clubs or organizations: Not on file    Relationship status: Not on file  . Intimate partner violence:    Fear of current or ex partner: Not on file    Emotionally abused: Not on file    Physically abused: Not on file    Forced sexual activity: Not on file  Other Topics Concern  . Not on file  Social History Narrative  . Not on  file      Current Outpatient Medications:  .  acetaminophen (TYLENOL) 500 MG tablet, Take 500 mg as needed by mouth., Disp: , Rfl:  .  amLODipine (NORVASC) 5 MG tablet, TAKE 1 TABLET DAILY, Disp: 90 tablet, Rfl: 4 .  cholecalciferol (VITAMIN D) 1000 units tablet, 1,000 Units daily. , Disp: , Rfl:  .  Cyanocobalamin (VITAMIN B12) 500 MCG TABS, 2 (two) times daily. , Disp: , Rfl:  .  ezetimibe (ZETIA) 10 MG tablet, TAKE 1 TABLET AT BEDTIME, Disp: 90 tablet, Rfl: 3 .  fenofibrate (TRICOR) 145 MG tablet,  TAKE 1 TABLET AT BEDTIME, Disp: 90 tablet, Rfl: 1 .  losartan (COZAAR) 100 MG tablet, Take 1 tablet (100 mg total) by mouth daily., Disp: 90 tablet, Rfl: 2 .  nitroGLYCERIN (NITROSTAT) 0.4 MG SL tablet, Place 1 tablet (0.4 mg total) every 5 (five) minutes as needed under the tongue for chest pain., Disp: 25 tablet, Rfl: 3 .  rosuvastatin (CRESTOR) 40 MG tablet, TAKE 1 TABLET AT BEDTIME (PLEASE KEEP UPCOMING APPOINTMENT FOR FUTURE REFILLS), Disp: 90 tablet, Rfl: 2 .  warfarin (COUMADIN) 5 MG tablet, TAKE 1 TABLET DAILY AT 6 P.M. (Patient taking differently: Take 5 mg by mouth daily. Take half tablet 2.5 mg Mon, wed, and Fri. Take whole tab remaining days.), Disp: 90 tablet, Rfl: 1  EXAM:  VITALS per patient if applicable:BP 831/51   Pulse 66   Resp 12   GENERAL: alert, oriented, appears well and in no acute distress  HEENT: atraumatic, conjunctiva clear, no obvious facial abnormalities on inspection.  LUNGS: on inspection no signs of respiratory distress, breathing rate appears normal, no obvious gross SOB, gasping or wheezing  CV: no obvious cyanosis  MS: moves all visible extremities without noticeable abnormality  PSYCH/NEURO: pleasant and cooperative, no obvious depression or anxiety, speech and thought processing grossly intact  ASSESSMENT AND PLAN:  Discussed the following assessment and plan: Orders Placed This Encounter  Procedures  . US Aorta  . Lipid panel  .  Comprehensive metabolic panel  . PSA(Must document that pt has been informed of limitations of PSA testing.)  . CBC  . Parathyroid hormone, intact (no Ca)  . Ambulatory referral to Gastroenterology    Medicare annual wellness visit, subsequent We discussed the importance of staying active, physically and mentally, as well as the benefits of a healthy/balance diet. Low impact exercise that involve stretching and strengthing are ideal. Vaccines: Prevnar 13 to be given during lab appt. We discussed preventive screening for the next 5-10 years, summery of recommendations discussed. Colon cancer screening due late 2020,per pt report.GI referral placed. Annual eye exam and glaucoma screening. Abdominal US for AAA will be arranged. Fall prevention.  Advance directives and end of life discussed, has POA and living will.   Colon cancer screening - Plan: Ambulatory referral to Gastroenterology  Screening for AAA (abdominal aortic aneurysm) - Plan: US Aorta  Prostate cancer screening - Plan: PSA(Must document that pt has been informed of limitations of PSA testing.)  Nocturia Most likely related to BPH. Stable.  Essential hypertension BP adequately controlled. No changes in losartan 100 mg daily or amlodipine 5 mg daily. Continue low-salt diet. Continue monitoring BP regularly. He has appointment with his cardiologist in 05/2019.  Hyperlipidemia No changes in current management. We will plan on checking lipid panel once it is safe to bring him to the clinic. Continue low-fat diet.  Vitamin D deficiency, unspecified Continue vitamin D 1000 units daily. We will plan on checking 25 OH vitamin D with next labs.  Former smoker AAA screening will be arranged.  He will continue following with Dr Irish Lack for hypercoagulation state and CAD.  We are opting to hold on blood work due to COVID-19 pandemia, future lab orders placed to have them done once it is safe to go to the office.   I  discussed the assessment and treatment plan with the patient. He was provided an opportunity to ask questions and all were answered. The patient agreed with the plan and demonstrated an understanding of the instructions.  Return in 6 months (on 09/28/2019) for f/u.    Betty Martinique, MD

## 2019-03-29 NOTE — Assessment & Plan Note (Signed)
BP adequately controlled. No changes in losartan 100 mg daily or amlodipine 5 mg daily. Continue low-salt diet. Continue monitoring BP regularly. He has appointment with his cardiologist in 05/2019.

## 2019-04-13 ENCOUNTER — Telehealth: Payer: Self-pay | Admitting: Pharmacist

## 2019-04-13 NOTE — Telephone Encounter (Signed)
Patient's wife called to report pt has started using topical 5FU cream and metronidazole topical 1% gel. Advised her that neither topical will interact with patient's warfarin, she is aware to keep f/u INR appt as scheduled.

## 2019-05-06 ENCOUNTER — Telehealth: Payer: Self-pay

## 2019-05-06 NOTE — Telephone Encounter (Signed)
lmom for prescreen  

## 2019-05-06 NOTE — Telephone Encounter (Signed)

## 2019-05-09 ENCOUNTER — Ambulatory Visit (INDEPENDENT_AMBULATORY_CARE_PROVIDER_SITE_OTHER): Payer: Medicare Other | Admitting: *Deleted

## 2019-05-09 ENCOUNTER — Other Ambulatory Visit: Payer: Self-pay

## 2019-05-09 DIAGNOSIS — Z5181 Encounter for therapeutic drug level monitoring: Secondary | ICD-10-CM

## 2019-05-09 DIAGNOSIS — D6859 Other primary thrombophilia: Secondary | ICD-10-CM

## 2019-05-09 LAB — POCT INR: INR: 3.5 — AB (ref 2.0–3.0)

## 2019-05-09 NOTE — Patient Instructions (Signed)
Description    Hold tonight's dose and continue on same dosage 1 tablet daily except 1/2 tablet on Mondays, Wednesdays, and Fridays. Recheck in 3 weeks. Call us with any new medications or concerns [phone] Cou[facility], Main # [phone].

## 2019-05-11 ENCOUNTER — Ambulatory Visit
Admission: RE | Admit: 2019-05-11 | Discharge: 2019-05-11 | Disposition: A | Discharge status: Home / Self Care | Payer: Medicare Other | Source: Ambulatory Visit | Attending: Family Medicine | Admitting: Family Medicine

## 2019-05-11 ENCOUNTER — Other Ambulatory Visit: Payer: Self-pay

## 2019-05-11 ENCOUNTER — Encounter: Payer: Self-pay | Admitting: Family Medicine

## 2019-05-11 DIAGNOSIS — Z136 Encounter for screening for cardiovascular disorders: Secondary | ICD-10-CM

## 2019-05-11 DIAGNOSIS — Z87891 Personal history of nicotine dependence: Secondary | ICD-10-CM | POA: Diagnosis not present

## 2019-05-20 ENCOUNTER — Encounter: Payer: Self-pay | Admitting: *Deleted

## 2019-05-23 ENCOUNTER — Telehealth: Payer: Self-pay

## 2019-05-23 ENCOUNTER — Telehealth: Payer: Self-pay | Admitting: *Deleted

## 2019-05-23 ENCOUNTER — Other Ambulatory Visit: Payer: Self-pay

## 2019-05-23 ENCOUNTER — Ambulatory Visit (INDEPENDENT_AMBULATORY_CARE_PROVIDER_SITE_OTHER): Payer: Medicare Other | Admitting: Internal Medicine

## 2019-05-23 ENCOUNTER — Telehealth: Payer: Self-pay | Admitting: General Surgery

## 2019-05-23 ENCOUNTER — Other Ambulatory Visit: Payer: Self-pay | Admitting: Interventional Cardiology

## 2019-05-23 DIAGNOSIS — D6859 Other primary thrombophilia: Secondary | ICD-10-CM

## 2019-05-23 DIAGNOSIS — Z1211 Encounter for screening for malignant neoplasm of colon: Secondary | ICD-10-CM | POA: Diagnosis not present

## 2019-05-23 DIAGNOSIS — I25119 Atherosclerotic heart disease of native coronary artery with unspecified angina pectoris: Secondary | ICD-10-CM

## 2019-05-23 DIAGNOSIS — Z7901 Long term (current) use of anticoagulants: Secondary | ICD-10-CM

## 2019-05-23 MED ORDER — SUPREP BOWEL PREP KIT 17.5-3.13-1.6 GM/177ML PO SOLN: 1.0000 | ORAL | 0 refills | Status: DC

## 2019-05-23 NOTE — Progress Notes (Signed)
Patient ID: DONATO STUDLEY, male   DOB: [birth year], 73 y.o.   MRN: [id number]  This service was provided via telemedicine.  Telephone encounter The patient was located at home The provider was located in provider's GI office. The patient did consent to this telephone visit and is aware of possible charges through their insurance for this visit.   The persons participating in this telemedicine service were the patient and I. Time spent on call: 14 min  HPI: Jaston Havens is a 73 year old male with a past medical history of CAD, hypercoagulable state on warfarin, hypertension, hyperlipidemia who is seen in consult at the request of Dr. Martinique to discuss screening colonoscopy.  He is seen by virtual visit today in the setting of COVID-19.  He reports that he is doing well.  He has no specific GI complaint.  He will occasionally have heartburn if he eats particularly spicy [place] food.  He uses Tums on occasion with good relief.  No dysphagia or odynophagia.  No upper GI symptoms which are troublesome to him.  No nausea or vomiting.  Stable weight.  Bowel movements are regular without change.  No blood in his stool or melena.  No complaints of diarrhea or constipation.  He had a colonoscopy performed 10 years ago in [place] which he recalls being normal.  No personal history of colon polyps.  No family history of colon cancer.  It sounds like his initial colon cancer screening test was a flexible sigmoidoscopy around age 65.  He recalls being awake for this exam but also recalls it being normal.  He takes chronic warfarin for hypercoagulable state and follows with Dr. Irish Lack for this issue along with this CAD.  He reports I performed his wife's colonoscopy recently.   Past Medical History:  Diagnosis Date  . Allergy   . Arthritis   . 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 Cataracts, bilateral   . Chicken pox   . Dyslipidemia   . Hyperlipidemia   . Hypertension    . Sinus bradycardia     Past Surgical History:  Procedure Laterality Date  . BYPASS GRAFT  1996  . CATARACT EXTRACTION Right   . REFRACTIVE SURGERY Right   . SKIN BIOPSY     head x 2  . UMBILICAL HERNIA REPAIR  2003    Outpatient Medications Prior to Visit  Medication Sig Dispense Refill  . acetaminophen (TYLENOL) 500 MG tablet Take 500 mg as needed by mouth.    Marland Kitchen amLODipine (NORVASC) 5 MG tablet TAKE 1 TABLET DAILY 90 tablet 4  . cholecalciferol (VITAMIN D) 1000 units tablet 1,000 Units daily.     . Cyanocobalamin (VITAMIN B12) 500 MCG TABS daily.     Marland Kitchen ezetimibe (ZETIA) 10 MG tablet TAKE 1 TABLET AT BEDTIME 90 tablet 3  . fenofibrate (TRICOR) 145 MG tablet TAKE 1 TABLET AT BEDTIME 90 tablet 1  . losartan (COZAAR) 100 MG tablet Take 1 tablet (100 mg total) by mouth daily. 90 tablet 2  . nitroGLYCERIN (NITROSTAT) 0.4 MG SL tablet Place 1 tablet (0.4 mg total) every 5 (five) minutes as needed under the tongue for chest pain. 25 tablet 3  . rosuvastatin (CRESTOR) 40 MG tablet TAKE 1 TABLET AT BEDTIME (PLEASE KEEP UPCOMING APPOINTMENT FOR FUTURE REFILLS) 90 tablet 2  . warfarin (COUMADIN) 5 MG tablet Take 1 tab daily except 1/2 tab on Mon, Wed, Fri or as directed by [facility]. 90 tablet 1  No facility-administered medications prior to visit.     Allergies  Allergen Reactions  . Other     Environmental allergies    Family History  Problem Relation Age of Onset  . Alzheimer's disease Mother   . Cancer Father        prostate  . Prostate cancer Father   . AAA (abdominal aortic aneurysm) Sister   . Hypertension Maternal Grandmother   . Hypertension Paternal Grandmother   . Colon cancer Neg Hx   . Esophageal cancer Neg Hx   . Pancreatic cancer Neg Hx   . Liver disease Neg Hx     Social History   Tobacco Use  . Smoking status: Former Smoker    Quit date: 05/29/1982    Years since quitting: 37.0  . Smokeless tobacco: Never Used  Substance Use Topics  . Alcohol  use: Yes    Comment: 0-2 drinks a night  . Drug use: No    ROS: As per history of present illness, otherwise negative  There were no vitals taken for this visit. No PE, virtual visit  RELEVANT LABS AND IMAGING: CBC    Component Value Date/Time   WBC 3.4 02/10/2017 0835   RBC 4.56 02/10/2017 0835   HGB 14.3 02/10/2017 0835   HCT 42.7 02/10/2017 0835   PLT 220 02/10/2017 0835   MCV 94 02/10/2017 0835   MCH 31.4 02/10/2017 0835   MCHC 33.5 02/10/2017 0835   RDW 14.4 02/10/2017 0835   LYMPHSABS 1.1 02/10/2017 0835   EOSABS 0.1 02/10/2017 0835   BASOSABS 0.0 02/10/2017 0835    CMP     Component Value Date/Time   NA 140 09/20/2018 1505   NA 145 (H) 02/10/2017 0835   K 4.3 09/20/2018 1505   CL 105 09/20/2018 1505   CO2 31 09/20/2018 1505   GLUCOSE 93 09/20/2018 1505   BUN 22 09/20/2018 1505   BUN 11 02/10/2017 0835   CREATININE 0.98 09/20/2018 1505   CALCIUM 11.0 (H) 09/20/2018 1505   PROT 6.5 03/29/2018 0929   PROT 7.2 02/10/2017 0835   ALBUMIN 4.5 03/29/2018 0929   ALBUMIN 4.9 (H) 02/10/2017 0835   AST 19 03/29/2018 0929   ALT 19 03/29/2018 0929   ALKPHOS 41 03/29/2018 0929   BILITOT 0.6 03/29/2018 0929   BILITOT 0.6 02/10/2017 0835   GFRNONAA 83 02/10/2017 0835   GFRAA 96 02/10/2017 0835    ASSESSMENT/PLAN: 73 year old male with a past medical history of CAD, hypercoagulable state on warfarin, hypertension, hyperlipidemia who is seen in consult at the request of Dr. Martinique to discuss screening colonoscopy.  1. CRC screening/chronic anticoagulation --average risk screening colonoscopy due at this time.  We discussed the risk, benefits and alternatives and he is agreeable and wishes to proceed.  We also discussed holding warfarin for 5 days before the procedure.  He reports for prior procedures such as dental work, he has not used a Lovenox bridge.  I will ultimately leave this decision to his managing provider, Dr. Irish Lack.  Will hold warfarin 5 days prior to  endoscopic procedures - will instruct when and how to resume after procedure. Benefits and risks of procedure explained including risks of bleeding, perforation, infection, missed lesions, reactions to medications and possible need for hospitalization and surgery for complications. Additional rare but real risk of stroke or other vascular clotting events off warfarin also explained and need to seek urgent help if any signs of these problems occur. Will communicate by phone or EMR with  patient's  prescribing provider to confirm that holding warfarin is reasonable in this case.   2.  Hypercoagulable state --on chronic warfarin, as above   RA:FOADLK, Malka So, [place],  [place] [id number]

## 2019-05-23 NOTE — Telephone Encounter (Signed)
Ok to hold warfarin for 5 days prior to procedure, does not need bridging.

## 2019-05-23 NOTE — Telephone Encounter (Signed)
Request for surgical clearance:     Endoscopy Procedure  What type of surgery is being performed?     colonoscopy  When is this surgery scheduled?     TBD  What type of clearance is required ?   Pharmacy  Are there any medications that need to be held prior to surgery and how long? Coumadin, 5 days  Practice name and name of physician performing surgery?      [facility]  What is your office phone and fax number?      Phone- [phone]  [phone]  Anesthesia type (None, local, MAC, general) ?       MAC

## 2019-05-23 NOTE — Telephone Encounter (Signed)

## 2019-05-23 NOTE — Telephone Encounter (Signed)
Patient called back. He has scheduled colonoscopy for 06/14/19 at 1 pm. I have spoken to him about time/date/location/prep for procedure and have mailed written instructions to his home address. He is advised that he should have a care partner 68 or older to bring him to his appointment, stay for procedure and take him home due to sedation. In addition, I have advised that if he has questions regarding his prep instructions, he should call back and ask for me and I will be happy to go over them with him. He is advised that Dr Irish Lack has okayed him to hold coumadin 5 days prior to procedure with no need for lovenox bridge. Patient verbalizes understanding.

## 2019-05-23 NOTE — Telephone Encounter (Signed)
Pt takes warfarin for hypercoagulable state that was diagnosed at the time of his CABG in 1996. He was found to have a markedly elevated fibrinopeptideAlevel, mild resistance to activated protein C and relative aspirin resistance with respect to ADP-induced platelet aggregation.  Per note from Dr Hilarie Fredrickson today with GI, pt reported that he has not used Lovenox shots in the past when he has held his warfarin. Will route to Dr Irish Lack to see if pt is at acceptable risk for holding warfarin for 5 days without Lovenox bridge.

## 2019-05-23 NOTE — Telephone Encounter (Signed)
OK to not use Lovenox as bridge.

## 2019-05-23 NOTE — Telephone Encounter (Signed)
I have spoken to patient to advise that per Dr Irish Lack, he may hold warfarin 5 days prior to procedure without need for lovenox bridging. Patient verbalizes understanding.

## 2019-05-23 NOTE — Telephone Encounter (Signed)
Left voicemail for patient to call back. He needs to schedule colonoscopy. We have also sent a letter to Dr Lamount Cohen for coumadin clearance and are awaiting response.

## 2019-05-23 NOTE — Patient Instructions (Addendum)
If you are age 72 or older, your body mass index should be between 23-30. Your There is no height or weight on file to calculate BMI. If this is out of the aforementioned range listed, please consider follow up with your Primary Care Provider.  If you are age 59 or younger, your body mass index should be between 19-25. Your There is no height or weight on file to calculate BMI. If this is out of the aformentioned range listed, please consider follow up with your Primary Care Provider.  Please contact our office to schedule a Colonoscopy.

## 2019-05-30 ENCOUNTER — Other Ambulatory Visit: Payer: Self-pay

## 2019-05-30 ENCOUNTER — Ambulatory Visit (INDEPENDENT_AMBULATORY_CARE_PROVIDER_SITE_OTHER): Payer: Medicare Other

## 2019-05-30 DIAGNOSIS — Z5181 Encounter for therapeutic drug level monitoring: Secondary | ICD-10-CM | POA: Diagnosis not present

## 2019-05-30 DIAGNOSIS — D6859 Other primary thrombophilia: Secondary | ICD-10-CM | POA: Diagnosis not present

## 2019-05-30 LAB — POCT INR: INR: 2.9 (ref 2.0–3.0)

## 2019-05-30 NOTE — Patient Instructions (Signed)
Description   Continue on same dosage 1 tablet daily except 1/2 tablet on Mondays, Wednesdays, and Fridays. Take your last dosage of Warfarin on 06/08/19 prior to colonoscopy on 06/14/19. Will hold Warfarin x 5 days prior to procedure.  Resume previous dosage regimen.  Recheck in 7-10 days after colonoscopy. Call us with any new medications or concerns [phone] [facility], Main # [phone].

## 2019-06-13 ENCOUNTER — Telehealth: Payer: Self-pay | Admitting: Internal Medicine

## 2019-06-13 NOTE — Telephone Encounter (Signed)
Spoke with patient regarding Covid-19 screening questions. °Covid-19 Screening Questions: ° °Do you now or have you had a fever in the last 14 days?  ° °Do you have any respiratory symptoms of shortness of breath or cough now or in the last 14 days?  ° °Do you have any family members or close contacts with diagnosed or suspected Covid-19 in the past 14 days?  ° °Have you been tested for Covid-19 and found to be positive?  ° °Pt made aware of that care partner may wait in the car or come up to the lobby during the procedure but will need to provide their own mask. °

## 2019-06-14 ENCOUNTER — Encounter: Payer: Self-pay | Admitting: Internal Medicine

## 2019-06-14 ENCOUNTER — Other Ambulatory Visit: Payer: Self-pay

## 2019-06-14 ENCOUNTER — Ambulatory Visit (AMBULATORY_SURGERY_CENTER): Payer: Medicare Other | Admitting: Internal Medicine

## 2019-06-14 VITALS — BP 106/64 | HR 52 | Temp 98.4°F | Resp 15 | Ht 75.0 in | Wt 220.0 lb

## 2019-06-14 DIAGNOSIS — K621 Rectal polyp: Secondary | ICD-10-CM | POA: Diagnosis not present

## 2019-06-14 DIAGNOSIS — D123 Benign neoplasm of transverse colon: Secondary | ICD-10-CM

## 2019-06-14 DIAGNOSIS — D122 Benign neoplasm of ascending colon: Secondary | ICD-10-CM | POA: Diagnosis not present

## 2019-06-14 DIAGNOSIS — D120 Benign neoplasm of cecum: Secondary | ICD-10-CM

## 2019-06-14 DIAGNOSIS — Z1211 Encounter for screening for malignant neoplasm of colon: Secondary | ICD-10-CM

## 2019-06-14 DIAGNOSIS — D124 Benign neoplasm of descending colon: Secondary | ICD-10-CM | POA: Diagnosis not present

## 2019-06-14 DIAGNOSIS — D128 Benign neoplasm of rectum: Secondary | ICD-10-CM

## 2019-06-14 MED ORDER — SODIUM CHLORIDE 0.9 % IV SOLN: 500.0000 mL | Freq: Once | INTRAVENOUS | Status: DC

## 2019-06-14 NOTE — Progress Notes (Signed)
Pt's states no medical or surgical changes since previsit or office visit. 

## 2019-06-14 NOTE — Patient Instructions (Signed)
Information on polyps, hemorrhoids and diverticulosis given to you today.    Await pathology results.  Resume Coumadin (Warfarin) at prior dose today.  YOU HAD AN ENDOSCOPIC PROCEDURE TODAY AT [facility]:   Refer to the procedure report that was given to you for any specific questions about what was found during the examination.  If the procedure report does not answer your questions, please call your gastroenterologist to clarify.  If you requested that your care partner not be given the details of your procedure findings, then the procedure report has been included in a sealed envelope for you to review at your convenience later.  YOU SHOULD EXPECT: Some feelings of bloating in the abdomen. Passage of more gas than usual.  Walking can help get rid of the air that was put into your GI tract during the procedure and reduce the bloating. If you had a lower endoscopy (such as a colonoscopy or flexible sigmoidoscopy) you may notice spotting of blood in your stool or on the toilet paper. If you underwent a bowel prep for your procedure, you may not have a normal bowel movement for a few days.  Please Note:  You might notice some irritation and congestion in your nose or some drainage.  This is from the oxygen used during your procedure.  There is no need for concern and it should clear up in a day or so.  SYMPTOMS TO REPORT IMMEDIATELY:   Following lower endoscopy (colonoscopy or flexible sigmoidoscopy):  Excessive amounts of blood in the stool  Significant tenderness or worsening of abdominal pains  Swelling of the abdomen that is new, acute  Fever of 100F or higher   For urgent or emergent issues, a gastroenterologist can be reached at any hour by calling [phone].   DIET:  We do recommend a small meal at first, but then you may proceed to your regular diet.  Drink plenty of fluids but you should avoid alcoholic beverages for 24 hours.  ACTIVITY:  You should plan to  take it easy for the rest of today and you should NOT DRIVE or use heavy machinery until tomorrow (because of the sedation medicines used during the test).    FOLLOW UP: Our staff will call the number listed on your records 48-72 hours following your procedure to check on you and address any questions or concerns that you may have regarding the information given to you following your procedure. If we do not reach you, we will leave a message.  We will attempt to reach you two times.  During this call, we will ask if you have developed any symptoms of COVID 19. If you develop any symptoms (ie: fever, flu-like symptoms, shortness of breath, cough etc.) before then, please call [phone].  If you test positive for Covid 19 in the 2 weeks post procedure, please call and report this information to [place].    If any biopsies were taken you will be contacted by phone or by letter within the next 1-3 weeks.  Please call us at [phone] if you have not heard about the biopsies in 3 weeks.    SIGNATURES/CONFIDENTIALITY: You and/or your care partner have signed paperwork which will be entered into your electronic medical record.  These signatures attest to the fact that that the information above on your After Visit Summary has been reviewed and is understood.  Full responsibility of the confidentiality of this discharge information lies with you and/or your care-partner.

## 2019-06-14 NOTE — Op Note (Signed)
[facility] Patient [name] Procedure Date: 06/14/2019 12:58 PM MRN: [id number] Endoscopist: Jerene Bears , MD Age: 73 Referring MD:  Date of Birth: 07-21-1946 Gender: Male Account #: [id number] Procedure:                Colonoscopy Indications:              Screening for colorectal malignant neoplasm, Last                            colonoscopy 10 years ago Medicines:                Monitored Anesthesia Care Procedure:                Pre-Anesthesia Assessment:                           - Prior to the procedure, a History and Physical                            was performed, and patient medications and                            allergies were reviewed. The patient's tolerance of                            previous anesthesia was also reviewed. The risks                            and benefits of the procedure and the sedation                            options and risks were discussed with the patient.                            All questions were answered, and informed consent                            was obtained. Prior Anticoagulants: The patient has                            taken Coumadin (warfarin), last dose was 5 days                            prior to procedure. ASA Grade Assessment: II - A                            patient with mild systemic disease. After reviewing                            the risks and benefits, the patient was deemed in                            satisfactory condition to undergo the procedure.  After obtaining informed consent, the colonoscope                            was passed under direct vision. Throughout the                            procedure, the patient's blood pressure, pulse, and                            oxygen saturations were monitored continuously. The                            Colonoscope was introduced through the anus and                            advanced to the cecum,  identified by appendiceal                            orifice and ileocecal valve. The colonoscopy was                            performed without difficulty. The patient tolerated                            the procedure well. The quality of the bowel                            preparation was good. The ileocecal valve,                            appendiceal orifice, and rectum were photographed. Scope In: 1:12:44 PM Scope Out: 1:38:46 PM Scope Withdrawal Time: 0 hours 17 minutes 7 seconds  Total Procedure Duration: 0 hours 26 minutes 2 seconds  Findings:                 The digital rectal exam was normal.                           A 4 mm polyp was found in the cecum. The polyp was                            sessile. The polyp was removed with a cold snare.                            Resection and retrieval were complete.                           A 6 mm polyp was found in the ascending colon. The                            polyp was sessile. The polyp was removed with a  cold snare. Resection and retrieval were complete.                           A 5 mm polyp was found in the transverse colon. The                            polyp was sessile. The polyp was removed with a                            cold snare. Resection and retrieval were complete.                           A 5 mm polyp was found in the descending colon. The                            polyp was sessile. The polyp was removed with a                            cold snare. Resection and retrieval were complete.                           A 4 mm polyp was found in the rectum. The polyp was                            sessile. The polyp was removed with a cold snare.                            Resection and retrieval were complete.                           Multiple small and large-mouthed diverticula were                            found in the sigmoid colon, descending colon and                             ascending colon.                           Internal hemorrhoids were found during                            retroflexion. The hemorrhoids were large. Complications:            No immediate complications. Estimated Blood Loss:     Estimated blood loss was minimal. Impression:               - One 4 mm polyp in the cecum, removed with a cold                            snare. Resected and retrieved.                           - One  6 mm polyp in the ascending colon, removed                            with a cold snare. Resected and retrieved.                           - One 5 mm polyp in the transverse colon, removed                            with a cold snare. Resected and retrieved.                           - One 5 mm polyp in the descending colon, removed                            with a cold snare. Resected and retrieved.                           - One 4 mm polyp in the rectum, removed with a cold                            snare. Resected and retrieved.                           - Severe diverticulosis in the sigmoid colon, in                            the descending colon and in the ascending colon.                           - Internal hemorrhoids. Recommendation:           - Patient has a contact number available for                            emergencies. The signs and symptoms of potential                            delayed complications were discussed with the                            patient. Return to normal activities tomorrow.                            Written discharge instructions were provided to the                            patient.                           - Resume previous diet.                           - Continue present medications.                           -  Await pathology results.                           - Repeat colonoscopy is recommended for                            surveillance. The colonoscopy date will be                             determined after pathology results from today's                            exam become available for review.                           - Resume Coumadin (warfarin) at prior dose today.                            Refer to managing physician for further adjustment                            of therapy. Jerene Bears, MD 06/14/2019 1:45:23 PM This report has been signed electronically.

## 2019-06-14 NOTE — Progress Notes (Signed)
A/ox3, pleased with MAC, report to RN 

## 2019-06-14 NOTE — Progress Notes (Signed)
Called to room to assist during endoscopic procedure.  Patient ID and intended procedure confirmed with present staff. Received instructions for my participation in the procedure from the performing physician.  

## 2019-06-16 ENCOUNTER — Telehealth: Payer: Self-pay | Admitting: *Deleted

## 2019-06-16 NOTE — Telephone Encounter (Signed)
  Follow up Call-  Call back number 06/14/2019  Post procedure Call Back phone  # [phone]  Permission to leave phone message Yes  Some recent data might be hidden     Patient questions:  Do you have a fever, pain , or abdominal swelling? No. Pain Score  0 *  Have you tolerated food without any problems? Yes.    Have you been able to return to your normal activities? Yes.    Do you have any questions about your discharge instructions: Diet   No. Medications  No. Follow up visit  No.  Do you have questions or concerns about your Care? No.  Actions: * If pain score is 4 or above: No action needed, pain <4.  1. Have you developed a fever since your procedure? NO  2.   Have you had an respiratory symptoms (SOB or cough) since your procedure? NO  3.   Have you tested positive for COVID 19 since your procedure NO  4.   Have you had any family members/close contacts diagnosed with the COVID 19 since your procedure?  NO   If yes to any of these questions please route to Joylene John, RN and Alphonsa Gin, RN.

## 2019-06-21 ENCOUNTER — Encounter: Payer: Self-pay | Admitting: Internal Medicine

## 2019-06-21 ENCOUNTER — Telehealth: Payer: Self-pay

## 2019-06-21 NOTE — Telephone Encounter (Signed)

## 2019-06-24 ENCOUNTER — Other Ambulatory Visit: Payer: Self-pay

## 2019-06-24 ENCOUNTER — Ambulatory Visit (INDEPENDENT_AMBULATORY_CARE_PROVIDER_SITE_OTHER): Payer: Medicare Other | Admitting: *Deleted

## 2019-06-24 DIAGNOSIS — Z5181 Encounter for therapeutic drug level monitoring: Secondary | ICD-10-CM

## 2019-06-24 DIAGNOSIS — D6859 Other primary thrombophilia: Secondary | ICD-10-CM | POA: Diagnosis not present

## 2019-06-24 LAB — POCT INR: INR: 3.3 — AB (ref 2.0–3.0)

## 2019-06-24 NOTE — Patient Instructions (Signed)
Description   Hold coumadin today, then continue on same dosage 1 tablet daily except 1/2 tablet on Mondays, Wednesdays, and Fridays. Call us with any new medications or concerns [phone] [facility], Main # [phone].

## 2019-07-15 ENCOUNTER — Ambulatory Visit (INDEPENDENT_AMBULATORY_CARE_PROVIDER_SITE_OTHER): Payer: Medicare Other | Admitting: *Deleted

## 2019-07-15 ENCOUNTER — Other Ambulatory Visit: Payer: Self-pay

## 2019-07-15 DIAGNOSIS — D6859 Other primary thrombophilia: Secondary | ICD-10-CM

## 2019-07-15 DIAGNOSIS — Z5181 Encounter for therapeutic drug level monitoring: Secondary | ICD-10-CM | POA: Diagnosis not present

## 2019-07-15 LAB — POCT INR: INR: 3.5 — AB (ref 2.0–3.0)

## 2019-07-15 NOTE — Patient Instructions (Signed)
Description   Hold coumadin today, then start taking 1/2 tablet daily except 1 tablet on Tuesdays, Thursdays, and Saturdays. Call us with any new medications or concerns [phone] [facility], Main # [phone]. Recheck INR in 3 weeks.

## 2019-07-18 ENCOUNTER — Other Ambulatory Visit: Payer: Self-pay

## 2019-07-18 MED ORDER — FENOFIBRATE 145 MG PO TABS: 145.0000 mg | ORAL_TABLET | Freq: Every day | ORAL | 1 refills | Status: DC

## 2019-08-05 ENCOUNTER — Ambulatory Visit (INDEPENDENT_AMBULATORY_CARE_PROVIDER_SITE_OTHER): Payer: Medicare Other

## 2019-08-05 ENCOUNTER — Other Ambulatory Visit: Payer: Self-pay

## 2019-08-05 DIAGNOSIS — Z5181 Encounter for therapeutic drug level monitoring: Secondary | ICD-10-CM

## 2019-08-05 DIAGNOSIS — D6859 Other primary thrombophilia: Secondary | ICD-10-CM

## 2019-08-05 LAB — POCT INR: INR: 4 — AB (ref 2.0–3.0)

## 2019-08-05 NOTE — Patient Instructions (Signed)
Description   Hold coumadin today, then start taking 1/2 tablet daily except 1 tablet on Tuesdays and Saturdays. Call us with any new medications or concerns [phone] [facility], Main # [phone]. Recheck INR in 3 weeks.

## 2019-08-26 ENCOUNTER — Ambulatory Visit (INDEPENDENT_AMBULATORY_CARE_PROVIDER_SITE_OTHER): Payer: Medicare Other

## 2019-08-26 ENCOUNTER — Other Ambulatory Visit: Payer: Self-pay

## 2019-08-26 DIAGNOSIS — Z5181 Encounter for therapeutic drug level monitoring: Secondary | ICD-10-CM

## 2019-08-26 DIAGNOSIS — D6859 Other primary thrombophilia: Secondary | ICD-10-CM | POA: Diagnosis not present

## 2019-08-26 LAB — POCT INR: INR: 2.4 (ref 2.0–3.0)

## 2019-08-26 NOTE — Patient Instructions (Signed)
Description   Continue on same dosage 1/2 tablet daily except 1 tablet on Tuesdays and Saturdays. Call us with any new medications or concerns [phone] [facility], Main # [phone]. Recheck INR in 4 weeks.

## 2019-09-06 ENCOUNTER — Other Ambulatory Visit: Payer: Self-pay

## 2019-09-06 ENCOUNTER — Ambulatory Visit (INDEPENDENT_AMBULATORY_CARE_PROVIDER_SITE_OTHER): Payer: Medicare Other

## 2019-09-06 DIAGNOSIS — Z23 Encounter for immunization: Secondary | ICD-10-CM

## 2019-09-19 DIAGNOSIS — M5126 Other intervertebral disc displacement, lumbar region: Secondary | ICD-10-CM | POA: Diagnosis not present

## 2019-09-19 DIAGNOSIS — M5124 Other intervertebral disc displacement, thoracic region: Secondary | ICD-10-CM | POA: Diagnosis not present

## 2019-09-19 DIAGNOSIS — M9902 Segmental and somatic dysfunction of thoracic region: Secondary | ICD-10-CM | POA: Diagnosis not present

## 2019-09-19 DIAGNOSIS — M9901 Segmental and somatic dysfunction of cervical region: Secondary | ICD-10-CM | POA: Diagnosis not present

## 2019-09-19 DIAGNOSIS — M9903 Segmental and somatic dysfunction of lumbar region: Secondary | ICD-10-CM | POA: Diagnosis not present

## 2019-09-19 DIAGNOSIS — M5022 Other cervical disc displacement, mid-cervical region, unspecified level: Secondary | ICD-10-CM | POA: Diagnosis not present

## 2019-09-23 ENCOUNTER — Other Ambulatory Visit: Payer: Self-pay

## 2019-09-23 ENCOUNTER — Ambulatory Visit (INDEPENDENT_AMBULATORY_CARE_PROVIDER_SITE_OTHER): Payer: Medicare Other | Admitting: *Deleted

## 2019-09-23 DIAGNOSIS — Z5181 Encounter for therapeutic drug level monitoring: Secondary | ICD-10-CM

## 2019-09-23 DIAGNOSIS — D6859 Other primary thrombophilia: Secondary | ICD-10-CM

## 2019-09-23 LAB — POCT INR: INR: 3.2 — AB (ref 2.0–3.0)

## 2019-09-23 NOTE — Patient Instructions (Signed)
Description   Hold today, then continue on same dosage 1/2 tablet daily except 1 tablet on Tuesdays and Saturdays. Call us with any new medications or concerns [phone] [facility], Main # [phone]. Recheck INR in 3 weeks.

## 2019-09-27 DIAGNOSIS — M5124 Other intervertebral disc displacement, thoracic region: Secondary | ICD-10-CM | POA: Diagnosis not present

## 2019-09-27 DIAGNOSIS — M9901 Segmental and somatic dysfunction of cervical region: Secondary | ICD-10-CM | POA: Diagnosis not present

## 2019-09-27 DIAGNOSIS — M9902 Segmental and somatic dysfunction of thoracic region: Secondary | ICD-10-CM | POA: Diagnosis not present

## 2019-09-27 DIAGNOSIS — M9903 Segmental and somatic dysfunction of lumbar region: Secondary | ICD-10-CM | POA: Diagnosis not present

## 2019-09-27 DIAGNOSIS — M5022 Other cervical disc displacement, mid-cervical region, unspecified level: Secondary | ICD-10-CM | POA: Diagnosis not present

## 2019-09-27 DIAGNOSIS — M5126 Other intervertebral disc displacement, lumbar region: Secondary | ICD-10-CM | POA: Diagnosis not present

## 2019-10-04 DIAGNOSIS — M9901 Segmental and somatic dysfunction of cervical region: Secondary | ICD-10-CM | POA: Diagnosis not present

## 2019-10-04 DIAGNOSIS — M5124 Other intervertebral disc displacement, thoracic region: Secondary | ICD-10-CM | POA: Diagnosis not present

## 2019-10-04 DIAGNOSIS — M5022 Other cervical disc displacement, mid-cervical region, unspecified level: Secondary | ICD-10-CM | POA: Diagnosis not present

## 2019-10-04 DIAGNOSIS — M9903 Segmental and somatic dysfunction of lumbar region: Secondary | ICD-10-CM | POA: Diagnosis not present

## 2019-10-04 DIAGNOSIS — M9902 Segmental and somatic dysfunction of thoracic region: Secondary | ICD-10-CM | POA: Diagnosis not present

## 2019-10-04 DIAGNOSIS — M5126 Other intervertebral disc displacement, lumbar region: Secondary | ICD-10-CM | POA: Diagnosis not present

## 2019-10-11 DIAGNOSIS — M9901 Segmental and somatic dysfunction of cervical region: Secondary | ICD-10-CM | POA: Diagnosis not present

## 2019-10-11 DIAGNOSIS — M9903 Segmental and somatic dysfunction of lumbar region: Secondary | ICD-10-CM | POA: Diagnosis not present

## 2019-10-11 DIAGNOSIS — M5022 Other cervical disc displacement, mid-cervical region, unspecified level: Secondary | ICD-10-CM | POA: Diagnosis not present

## 2019-10-11 DIAGNOSIS — M5126 Other intervertebral disc displacement, lumbar region: Secondary | ICD-10-CM | POA: Diagnosis not present

## 2019-10-11 DIAGNOSIS — M9902 Segmental and somatic dysfunction of thoracic region: Secondary | ICD-10-CM | POA: Diagnosis not present

## 2019-10-11 DIAGNOSIS — M5124 Other intervertebral disc displacement, thoracic region: Secondary | ICD-10-CM | POA: Diagnosis not present

## 2019-10-14 ENCOUNTER — Other Ambulatory Visit: Payer: Self-pay

## 2019-10-14 ENCOUNTER — Ambulatory Visit (INDEPENDENT_AMBULATORY_CARE_PROVIDER_SITE_OTHER): Payer: Medicare Other | Admitting: *Deleted

## 2019-10-14 DIAGNOSIS — D6859 Other primary thrombophilia: Secondary | ICD-10-CM

## 2019-10-14 DIAGNOSIS — Z5181 Encounter for therapeutic drug level monitoring: Secondary | ICD-10-CM

## 2019-10-14 LAB — POCT INR: INR: 2.6 (ref 2.0–3.0)

## 2019-10-14 NOTE — Patient Instructions (Signed)
Description   Continue taking 1/2 tablet daily except 1 tablet on Tuesdays and Saturdays. Call us with any new medications or concerns [phone] [facility], Main # [phone]. Recheck INR in 4 weeks.

## 2019-10-18 DIAGNOSIS — M5124 Other intervertebral disc displacement, thoracic region: Secondary | ICD-10-CM | POA: Diagnosis not present

## 2019-10-18 DIAGNOSIS — M5126 Other intervertebral disc displacement, lumbar region: Secondary | ICD-10-CM | POA: Diagnosis not present

## 2019-10-18 DIAGNOSIS — M9903 Segmental and somatic dysfunction of lumbar region: Secondary | ICD-10-CM | POA: Diagnosis not present

## 2019-10-18 DIAGNOSIS — M9902 Segmental and somatic dysfunction of thoracic region: Secondary | ICD-10-CM | POA: Diagnosis not present

## 2019-10-18 DIAGNOSIS — M9901 Segmental and somatic dysfunction of cervical region: Secondary | ICD-10-CM | POA: Diagnosis not present

## 2019-10-18 DIAGNOSIS — M5022 Other cervical disc displacement, mid-cervical region, unspecified level: Secondary | ICD-10-CM | POA: Diagnosis not present

## 2019-10-25 DIAGNOSIS — M9903 Segmental and somatic dysfunction of lumbar region: Secondary | ICD-10-CM | POA: Diagnosis not present

## 2019-10-25 DIAGNOSIS — M9902 Segmental and somatic dysfunction of thoracic region: Secondary | ICD-10-CM | POA: Diagnosis not present

## 2019-10-25 DIAGNOSIS — M5124 Other intervertebral disc displacement, thoracic region: Secondary | ICD-10-CM | POA: Diagnosis not present

## 2019-10-25 DIAGNOSIS — M5126 Other intervertebral disc displacement, lumbar region: Secondary | ICD-10-CM | POA: Diagnosis not present

## 2019-10-25 DIAGNOSIS — M9901 Segmental and somatic dysfunction of cervical region: Secondary | ICD-10-CM | POA: Diagnosis not present

## 2019-10-25 DIAGNOSIS — M5022 Other cervical disc displacement, mid-cervical region, unspecified level: Secondary | ICD-10-CM | POA: Diagnosis not present

## 2019-11-08 DIAGNOSIS — M9903 Segmental and somatic dysfunction of lumbar region: Secondary | ICD-10-CM | POA: Diagnosis not present

## 2019-11-08 DIAGNOSIS — M9902 Segmental and somatic dysfunction of thoracic region: Secondary | ICD-10-CM | POA: Diagnosis not present

## 2019-11-08 DIAGNOSIS — M5124 Other intervertebral disc displacement, thoracic region: Secondary | ICD-10-CM | POA: Diagnosis not present

## 2019-11-08 DIAGNOSIS — M9901 Segmental and somatic dysfunction of cervical region: Secondary | ICD-10-CM | POA: Diagnosis not present

## 2019-11-08 DIAGNOSIS — M5126 Other intervertebral disc displacement, lumbar region: Secondary | ICD-10-CM | POA: Diagnosis not present

## 2019-11-08 DIAGNOSIS — M5022 Other cervical disc displacement, mid-cervical region, unspecified level: Secondary | ICD-10-CM | POA: Diagnosis not present

## 2019-11-11 ENCOUNTER — Other Ambulatory Visit: Payer: Self-pay

## 2019-11-11 ENCOUNTER — Ambulatory Visit (INDEPENDENT_AMBULATORY_CARE_PROVIDER_SITE_OTHER): Payer: Medicare Other | Admitting: *Deleted

## 2019-11-11 DIAGNOSIS — Z5181 Encounter for therapeutic drug level monitoring: Secondary | ICD-10-CM | POA: Diagnosis not present

## 2019-11-11 DIAGNOSIS — D6859 Other primary thrombophilia: Secondary | ICD-10-CM | POA: Diagnosis not present

## 2019-11-11 LAB — POCT INR: INR: 3 (ref 2.0–3.0)

## 2019-11-11 NOTE — Patient Instructions (Signed)
Description   Continue taking 1/2 tablet daily except 1 tablet on Tuesdays and Saturdays. Call us with any new medications or concerns [phone] [facility], Main # [phone]. Recheck INR in 5 weeks.

## 2019-11-14 DIAGNOSIS — H35033 Hypertensive retinopathy, bilateral: Secondary | ICD-10-CM | POA: Diagnosis not present

## 2019-11-14 DIAGNOSIS — H2512 Age-related nuclear cataract, left eye: Secondary | ICD-10-CM | POA: Diagnosis not present

## 2019-11-14 DIAGNOSIS — H43812 Vitreous degeneration, left eye: Secondary | ICD-10-CM | POA: Diagnosis not present

## 2019-11-14 DIAGNOSIS — H04123 Dry eye syndrome of bilateral lacrimal glands: Secondary | ICD-10-CM | POA: Diagnosis not present

## 2019-12-05 ENCOUNTER — Other Ambulatory Visit: Payer: Self-pay | Admitting: Interventional Cardiology

## 2019-12-09 ENCOUNTER — Other Ambulatory Visit: Payer: Self-pay | Admitting: Interventional Cardiology

## 2019-12-13 DIAGNOSIS — M5022 Other cervical disc displacement, mid-cervical region, unspecified level: Secondary | ICD-10-CM | POA: Diagnosis not present

## 2019-12-13 DIAGNOSIS — M5124 Other intervertebral disc displacement, thoracic region: Secondary | ICD-10-CM | POA: Diagnosis not present

## 2019-12-13 DIAGNOSIS — M5126 Other intervertebral disc displacement, lumbar region: Secondary | ICD-10-CM | POA: Diagnosis not present

## 2019-12-13 DIAGNOSIS — M9901 Segmental and somatic dysfunction of cervical region: Secondary | ICD-10-CM | POA: Diagnosis not present

## 2019-12-13 DIAGNOSIS — M9902 Segmental and somatic dysfunction of thoracic region: Secondary | ICD-10-CM | POA: Diagnosis not present

## 2019-12-13 DIAGNOSIS — M9903 Segmental and somatic dysfunction of lumbar region: Secondary | ICD-10-CM | POA: Diagnosis not present

## 2019-12-16 ENCOUNTER — Other Ambulatory Visit: Payer: Self-pay

## 2019-12-16 ENCOUNTER — Ambulatory Visit (INDEPENDENT_AMBULATORY_CARE_PROVIDER_SITE_OTHER): Payer: Medicare Other | Admitting: *Deleted

## 2019-12-16 DIAGNOSIS — D6859 Other primary thrombophilia: Secondary | ICD-10-CM

## 2019-12-16 DIAGNOSIS — Z5181 Encounter for therapeutic drug level monitoring: Secondary | ICD-10-CM | POA: Diagnosis not present

## 2019-12-16 LAB — POCT INR: INR: 2.7 (ref 2.0–3.0)

## 2019-12-16 NOTE — Patient Instructions (Addendum)
Description   Continue taking 1/2 tablet daily except 1 tablet on Tuesdays and Saturdays. Call us with any new medications or concerns [phone] [facility], Main # [phone]. Recheck INR in 6 weeks.

## 2019-12-20 DIAGNOSIS — M5022 Other cervical disc displacement, mid-cervical region, unspecified level: Secondary | ICD-10-CM | POA: Diagnosis not present

## 2019-12-20 DIAGNOSIS — M9903 Segmental and somatic dysfunction of lumbar region: Secondary | ICD-10-CM | POA: Diagnosis not present

## 2019-12-20 DIAGNOSIS — M9901 Segmental and somatic dysfunction of cervical region: Secondary | ICD-10-CM | POA: Diagnosis not present

## 2019-12-20 DIAGNOSIS — M5126 Other intervertebral disc displacement, lumbar region: Secondary | ICD-10-CM | POA: Diagnosis not present

## 2019-12-20 DIAGNOSIS — M5124 Other intervertebral disc displacement, thoracic region: Secondary | ICD-10-CM | POA: Diagnosis not present

## 2019-12-20 DIAGNOSIS — M9902 Segmental and somatic dysfunction of thoracic region: Secondary | ICD-10-CM | POA: Diagnosis not present

## 2019-12-24 ENCOUNTER — Other Ambulatory Visit: Payer: Self-pay | Admitting: Family Medicine

## 2019-12-27 ENCOUNTER — Telehealth: Payer: Self-pay | Admitting: Interventional Cardiology

## 2019-12-27 DIAGNOSIS — M9902 Segmental and somatic dysfunction of thoracic region: Secondary | ICD-10-CM | POA: Diagnosis not present

## 2019-12-27 DIAGNOSIS — M5022 Other cervical disc displacement, mid-cervical region, unspecified level: Secondary | ICD-10-CM | POA: Diagnosis not present

## 2019-12-27 DIAGNOSIS — M5126 Other intervertebral disc displacement, lumbar region: Secondary | ICD-10-CM | POA: Diagnosis not present

## 2019-12-27 DIAGNOSIS — M9901 Segmental and somatic dysfunction of cervical region: Secondary | ICD-10-CM | POA: Diagnosis not present

## 2019-12-27 DIAGNOSIS — M9903 Segmental and somatic dysfunction of lumbar region: Secondary | ICD-10-CM | POA: Diagnosis not present

## 2019-12-27 DIAGNOSIS — M5124 Other intervertebral disc displacement, thoracic region: Secondary | ICD-10-CM | POA: Diagnosis not present

## 2019-12-27 NOTE — Telephone Encounter (Signed)
New Message  Pt's wife called and said that she will be accompanying him on his visit on 12/29/19. She said that he is unsteady on his feet and that old people should not go to the doctor's office by themself.  Please call to discuss.

## 2019-12-27 NOTE — Progress Notes (Signed)
Cardiology Office Note   Date:  12/29/2019   ID:  [name], [name] May 05, 1946, MRN [id number]  PCP:  Martinique, Betty G, MD    No chief complaint on file.  CAD  Wt Readings from Last 3 Encounters:  12/29/19 204 lb (92.5 kg)  06/14/19 220 lb (99.8 kg)  05/20/19 220 lb (99.8 kg)       History of Present Illness: [name] is a 74 y.o. male  who had CABG in 1996. He was being treated for HTN at the time. He was on vacation and was cleaning and felt severe chest pain with activity. He failed an ETT. He had a cath showing 3 vessel disease and had CABG x 3 in 1996 ( LIMA was used.). Since then, he has not had a cath. He continued rehab for an extended period of time. 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  He moved to [place] in Jan 2018 from [place].  He uses a cane and walker to walk due to instability. He has completed a physical therapy program.  Quit smoking 35 years ago.   He was diagnosed with a hypercoagulable state at the time of CABG and he has been on Coumadin since that time. Nose bleed when INR increased. He was found to have a markedly elevated fibrinopeptideAlevel, mild resistance to activated protein C and relative aspirin resistance with respect to ADP-induced platelet aggregation.  He was seen at [facility] for his neuropathy. He has DJD of the spine. He goes to the chiropracter.  His biggest issue is his back.  He was referred to a massage therapist. He has had some improvement, but still has some balance problems.  He has not fallen and uses a cane.    Denies :  Dizziness. Leg edema. Nitroglycerin use. Orthopnea. Palpitations. Paroxysmal nocturnal dyspnea. Shortness of breath. Syncope.   No sx like his prior angina.   Has some muscle soreness after activity like carrying Christmas decorations.    Home BP readings are well controlled.  130/70s.    No recent lab work.          Past Medical History:  Diagnosis Date  . Allergy   . Arthritis   . 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 Cataracts, bilateral   . Chicken pox   . Dyslipidemia   . Hyperlipidemia   . Hypertension   . Sinus bradycardia     Past Surgical History:  Procedure Laterality Date  . BYPASS GRAFT  1996  . CATARACT EXTRACTION Right   . REFRACTIVE SURGERY Right   . SKIN BIOPSY     head x 2  . UMBILICAL HERNIA REPAIR  2003     Current Outpatient Medications  Medication Sig Dispense Refill  . acetaminophen (TYLENOL) 500 MG tablet Take 500 mg as needed by mouth.    Marland Kitchen amLODipine (NORVASC) 5 MG tablet TAKE 1 TABLET DAILY 90 tablet 4  . cholecalciferol (VITAMIN D) 1000 units tablet 1,000 Units daily.     . Cyanocobalamin (VITAMIN B12) 500 MCG TABS daily.     Marland Kitchen ezetimibe (ZETIA) 10 MG tablet TAKE 1 TABLET AT BEDTIME 90 tablet 3  . fenofibrate (TRICOR) 145 MG tablet Take 1 tablet (145 mg total) by mouth at bedtime. 90 tablet 1  . losartan (COZAAR) 100 MG tablet TAKE 1 TABLET DAILY 90 tablet 3  . nitroGLYCERIN (NITROSTAT) 0.4 MG SL tablet Place 1 tablet (  0.4 mg total) every 5 (five) minutes as needed under the tongue for chest pain. 25 tablet 3  . rosuvastatin (CRESTOR) 40 MG tablet TAKE 1 TABLET AT BEDTIME (PLEASE KEEP UPCOMING APPOINTMENT FOR FUTURE REFILLS) 90 tablet 0  . warfarin (COUMADIN) 5 MG tablet TAKE 1/2 TABLET DAILY EXCEPT TAKE 1 TABLET ON Tuesday and Saturday OR AS DIRECTED BY COUMADIN CLINIC 80 tablet 1   No current facility-administered medications for this visit.    Allergies:   Other    Social History:  The patient  reports that he quit smoking about 37 years ago. He has never used smokeless tobacco. He reports current alcohol use. He reports that he does not use drugs.   Family History:  The patient's family history includes AAA (abdominal aortic aneurysm) in his sister; Alzheimer's disease in his mother; Cancer in his father; Hypertension in his  maternal grandmother and paternal grandmother; Prostate cancer in his father.    ROS:  Please see the history of present illness.   Otherwise, review of systems are positive for balance issues.   All other systems are reviewed and negative.    PHYSICAL EXAM: VS:  BP (!) 144/80   Pulse 72   Ht 6\' 3"  (1.905 m)   Wt 204 lb (92.5 kg)   SpO2 97%   BMI 25.50 kg/m  , BMI Body mass index is 25.5 kg/m. GEN: Well nourished, well developed, in no acute distress  HEENT: normal  Neck: no JVD, carotid bruits, or masses Cardiac: RRR; no murmurs, rubs, or gallops,; 1+ LE edema bilaterally Respiratory:  clear to auscultation bilaterally, normal work of breathing GI: soft, nontender, nondistended, + BS MS: no deformity or atrophy  Skin: warm and dry, no rash Neuro:  Strength and sensation are intact Psych: euthymic mood, full affect   EKG:   The ekg ordered today demonstrates NSR, LAD, nonspecific ST changes   Recent Labs: No results found for requested labs within last 8760 hours.   Lipid Panel    Component Value Date/Time   CHOL 130 03/29/2018 0929   CHOL 118 02/10/2017 0835   TRIG 84.0 03/29/2018 0929   HDL 47.10 03/29/2018 0929   HDL 56 02/10/2017 0835   CHOLHDL 3 03/29/2018 0929   VLDL 16.8 03/29/2018 0929   LDLCALC 66 03/29/2018 0929   LDLCALC 45 02/10/2017 0835     Other studies Reviewed: Additional studies/ records that were reviewed today with results demonstrating: 2019 labs reviewed.   ASSESSMENT AND PLAN:  1. CAD: COntinue aggressive secondary prevention. CABG in 1996.  No testing since that time. Some atypical chest pain that sounds more like muscle soreness.  Not like prior angina.  We discussed a lexiscan myoview, but they want to think about it.  He would not be able to walk the treadmill as he uses a cane for balance.  2. Hyperlipidemia: Repeat labs 3. Anticoagulated: Known hypercoagulable state. 4. HTN: The current medical regimen is effective;  continue  present plan and medications.    Current medicines are reviewed at length with the patient today.  The patient concerns regarding his medicines were addressed.  The following changes have been made:  No change  Labs/ tests ordered today include: CMet, CBC lipids  Orders Placed This Encounter  Procedures  . Comprehensive metabolic panel  . CBC  . Lipid panel  . EKG 12-Lead    Recommend 150 minutes/week of aerobic exercise Low fat, low carb, high fiber diet recommended  Disposition:   FU  in 1 year, or sooner if they decide on stress test- they will  [employer] message   Signed, Larae Grooms, MD  12/29/2019 11:53 AM    [facility] [address], [place],   [address] Phone: [phone]; Fax: [phone]

## 2019-12-27 NOTE — Telephone Encounter (Signed)
Pts wife is calling in to state she will need to accompany the pt to his OV with Dr. Irish Lack on 12/29/19, due to pt being very unsteady with his gait. Noted that pt does have a diagnosis of "unsteady gait," and this is acceptable for his wife to accommodate him to his OV on 1/21.  Updated this in appt notes.  Advised the pts wife that they both must wear their mask to this appt, and during the entire duration of this appt. Both verbalized understanding and agrees with this plan.

## 2019-12-29 ENCOUNTER — Encounter: Payer: Self-pay | Admitting: Interventional Cardiology

## 2019-12-29 ENCOUNTER — Ambulatory Visit (INDEPENDENT_AMBULATORY_CARE_PROVIDER_SITE_OTHER): Payer: Medicare Other | Admitting: Interventional Cardiology

## 2019-12-29 ENCOUNTER — Other Ambulatory Visit: Payer: Self-pay

## 2019-12-29 VITALS — BP 144/80 | HR 72 | Ht 75.0 in | Wt 204.0 lb

## 2019-12-29 DIAGNOSIS — I25119 Atherosclerotic heart disease of native coronary artery with unspecified angina pectoris: Secondary | ICD-10-CM | POA: Diagnosis not present

## 2019-12-29 DIAGNOSIS — R0789 Other chest pain: Secondary | ICD-10-CM

## 2019-12-29 DIAGNOSIS — E782 Mixed hyperlipidemia: Secondary | ICD-10-CM

## 2019-12-29 DIAGNOSIS — Z7901 Long term (current) use of anticoagulants: Secondary | ICD-10-CM

## 2019-12-29 DIAGNOSIS — I10 Essential (primary) hypertension: Secondary | ICD-10-CM | POA: Diagnosis not present

## 2019-12-29 NOTE — Patient Instructions (Addendum)
Medication Instructions:  Your physician recommends that you continue on your current medications as directed. Please refer to the Current Medication list given to you today.  *If you need a refill on your cardiac medications before your next appointment, please call your pharmacy*  Lab Work: TOMORROW: CBC, CMET, LIPIDS  If you have labs (blood work) drawn today and your tests are completely normal, you will receive your results only by: Marland Kitchen MyChart Message (if you have MyChart) OR . A paper copy in the mail If you have any lab test that is abnormal or we need to change your treatment, we will call you to review the results.  Testing/Procedures: None ordered  Follow-Up: At [facility], you and your health needs are our priority.  As part of our continuing mission to provide you with exceptional heart care, we have created designated Provider Care Teams.  These Care Teams include your primary Cardiologist (physician) and Advanced Practice Providers (APPs -  Physician Assistants and Nurse Practitioners) who all work together to provide you with the care you need, when you need it.  Your next appointment:   12 month(s)  The format for your next appointment:   In Person  Provider:   You may see Larae Grooms, MD or one of the following Advanced Practice Providers on your designated Care Team:    Melina Copa, PA-C  Ermalinda Barrios, PA-C   Other Instructions

## 2019-12-30 ENCOUNTER — Other Ambulatory Visit: Payer: Medicare Other | Admitting: *Deleted

## 2019-12-30 DIAGNOSIS — Z7901 Long term (current) use of anticoagulants: Secondary | ICD-10-CM | POA: Diagnosis not present

## 2019-12-30 DIAGNOSIS — I25119 Atherosclerotic heart disease of native coronary artery with unspecified angina pectoris: Secondary | ICD-10-CM

## 2019-12-30 DIAGNOSIS — E782 Mixed hyperlipidemia: Secondary | ICD-10-CM | POA: Diagnosis not present

## 2019-12-30 DIAGNOSIS — I10 Essential (primary) hypertension: Secondary | ICD-10-CM

## 2019-12-30 LAB — COMPREHENSIVE METABOLIC PANEL
ALT: 19 IU/L (ref 0–44)
AST: 25 IU/L (ref 0–40)
Albumin/Globulin Ratio: 3 — ABNORMAL HIGH (ref 1.2–2.2)
Albumin: 5.1 g/dL — ABNORMAL HIGH (ref 3.7–4.7)
Alkaline Phosphatase: 46 IU/L (ref 39–117)
BUN/Creatinine Ratio: 17 (ref 10–24)
BUN: 17 mg/dL (ref 8–27)
Bilirubin Total: 0.5 mg/dL (ref 0.0–1.2)
CO2: 23 mmol/L (ref 20–29)
Calcium: 10.5 mg/dL — ABNORMAL HIGH (ref 8.6–10.2)
Chloride: 107 mmol/L — ABNORMAL HIGH (ref 96–106)
Creatinine, Ser: 0.99 mg/dL (ref 0.76–1.27)
GFR calc Af Amer: 88 mL/min/{1.73_m2} (ref >59)
GFR calc non Af Amer: 76 mL/min/{1.73_m2} (ref >59)
Globulin, Total: 1.7 g/dL (ref 1.5–4.5)
Glucose: 101 mg/dL — ABNORMAL HIGH (ref 65–99)
Potassium: 4.5 mmol/L (ref 3.5–5.2)
Sodium: 144 mmol/L (ref 134–144)
Total Protein: 6.8 g/dL (ref 6.0–8.5)

## 2019-12-30 LAB — LIPID PANEL
Chol/HDL Ratio: 2.1 ratio (ref 0.0–5.0)
Cholesterol, Total: 115 mg/dL (ref 100–199)
HDL: 54 mg/dL (ref >39)
LDL Chol Calc (NIH): 49 mg/dL (ref 0–99)
Triglycerides: 48 mg/dL (ref 0–149)
VLDL Cholesterol Cal: 12 mg/dL (ref 5–40)

## 2019-12-30 LAB — CBC
Hematocrit: 43.2 % (ref 37.5–51.0)
Hemoglobin: 14.4 g/dL (ref 13.0–17.7)
MCH: 31.4 pg (ref 26.6–33.0)
MCHC: 33.3 g/dL (ref 31.5–35.7)
MCV: 94 fL (ref 79–97)
Platelets: 202 10*3/uL (ref 150–450)
RBC: 4.58 x10E6/uL (ref 4.14–5.80)
RDW: 13.6 % (ref 11.6–15.4)
WBC: 3.9 10*3/uL (ref 3.4–10.8)

## 2020-01-03 DIAGNOSIS — M5126 Other intervertebral disc displacement, lumbar region: Secondary | ICD-10-CM | POA: Diagnosis not present

## 2020-01-03 DIAGNOSIS — M9902 Segmental and somatic dysfunction of thoracic region: Secondary | ICD-10-CM | POA: Diagnosis not present

## 2020-01-03 DIAGNOSIS — M9901 Segmental and somatic dysfunction of cervical region: Secondary | ICD-10-CM | POA: Diagnosis not present

## 2020-01-03 DIAGNOSIS — M5124 Other intervertebral disc displacement, thoracic region: Secondary | ICD-10-CM | POA: Diagnosis not present

## 2020-01-03 DIAGNOSIS — M9903 Segmental and somatic dysfunction of lumbar region: Secondary | ICD-10-CM | POA: Diagnosis not present

## 2020-01-03 DIAGNOSIS — M5022 Other cervical disc displacement, mid-cervical region, unspecified level: Secondary | ICD-10-CM | POA: Diagnosis not present

## 2020-01-14 ENCOUNTER — Other Ambulatory Visit: Payer: Self-pay | Admitting: Interventional Cardiology

## 2020-01-15 ENCOUNTER — Ambulatory Visit: Payer: Medicare Other | Attending: Internal Medicine

## 2020-01-15 DIAGNOSIS — Z23 Encounter for immunization: Secondary | ICD-10-CM

## 2020-01-15 NOTE — Progress Notes (Signed)
   Covid-19 Vaccination Clinic  Name:  [name]    MRN: [id number] DOB: [birth year]  01/15/2020  [name] was observed post Covid-19 immunization for 15 minutes without incidence. He was provided with Vaccine Information Sheet and instruction to access the V-Safe system.   [name] was instructed to call 911 with any severe reactions post vaccine: Marland Kitchen Difficulty breathing  . Swelling of your face and throat  . A fast heartbeat  . A bad rash all over your body  . Dizziness and weakness    Immunizations Administered    Name Date Dose VIS Date Route   Pfizer COVID-19 Vaccine 01/15/2020  2:52 PM 0.3 mL 11/18/2019 Intramuscular   Manufacturer: [place]   Lot: [id number]   [place]: [id number]

## 2020-01-17 DIAGNOSIS — M9902 Segmental and somatic dysfunction of thoracic region: Secondary | ICD-10-CM | POA: Diagnosis not present

## 2020-01-17 DIAGNOSIS — M9901 Segmental and somatic dysfunction of cervical region: Secondary | ICD-10-CM | POA: Diagnosis not present

## 2020-01-17 DIAGNOSIS — M5124 Other intervertebral disc displacement, thoracic region: Secondary | ICD-10-CM | POA: Diagnosis not present

## 2020-01-17 DIAGNOSIS — M5022 Other cervical disc displacement, mid-cervical region, unspecified level: Secondary | ICD-10-CM | POA: Diagnosis not present

## 2020-01-17 DIAGNOSIS — M5126 Other intervertebral disc displacement, lumbar region: Secondary | ICD-10-CM | POA: Diagnosis not present

## 2020-01-17 DIAGNOSIS — M9903 Segmental and somatic dysfunction of lumbar region: Secondary | ICD-10-CM | POA: Diagnosis not present

## 2020-01-26 DIAGNOSIS — H2512 Age-related nuclear cataract, left eye: Secondary | ICD-10-CM | POA: Diagnosis not present

## 2020-01-26 DIAGNOSIS — T1512XA Foreign body in conjunctival sac, left eye, initial encounter: Secondary | ICD-10-CM | POA: Diagnosis not present

## 2020-01-30 ENCOUNTER — Ambulatory Visit (INDEPENDENT_AMBULATORY_CARE_PROVIDER_SITE_OTHER): Payer: Medicare Other | Admitting: Pharmacist

## 2020-01-30 ENCOUNTER — Other Ambulatory Visit: Payer: Self-pay

## 2020-01-30 DIAGNOSIS — D6859 Other primary thrombophilia: Secondary | ICD-10-CM

## 2020-01-30 DIAGNOSIS — Z5181 Encounter for therapeutic drug level monitoring: Secondary | ICD-10-CM

## 2020-01-30 LAB — POCT INR: INR: 2.1 (ref 2.0–3.0)

## 2020-01-30 NOTE — Patient Instructions (Signed)
Continue taking 1/2 tablet daily except 1 tablet on Tuesdays and Saturdays. Call us with any new medications or concerns [phone] [facility], Main # [phone]. Recheck INR in 6 weeks.

## 2020-01-31 DIAGNOSIS — M9901 Segmental and somatic dysfunction of cervical region: Secondary | ICD-10-CM | POA: Diagnosis not present

## 2020-01-31 DIAGNOSIS — M5022 Other cervical disc displacement, mid-cervical region, unspecified level: Secondary | ICD-10-CM | POA: Diagnosis not present

## 2020-01-31 DIAGNOSIS — M9903 Segmental and somatic dysfunction of lumbar region: Secondary | ICD-10-CM | POA: Diagnosis not present

## 2020-01-31 DIAGNOSIS — M9902 Segmental and somatic dysfunction of thoracic region: Secondary | ICD-10-CM | POA: Diagnosis not present

## 2020-01-31 DIAGNOSIS — M5124 Other intervertebral disc displacement, thoracic region: Secondary | ICD-10-CM | POA: Diagnosis not present

## 2020-01-31 DIAGNOSIS — M5126 Other intervertebral disc displacement, lumbar region: Secondary | ICD-10-CM | POA: Diagnosis not present

## 2020-02-09 ENCOUNTER — Ambulatory Visit: Payer: Medicare Other | Attending: Internal Medicine

## 2020-02-09 DIAGNOSIS — Z23 Encounter for immunization: Secondary | ICD-10-CM

## 2020-02-09 NOTE — Progress Notes (Signed)
   Covid-19 Vaccination Clinic  Name:  EMERSYN OSTERGARD    MRN: [id number] DOB: 1946/01/30  02/09/2020  Mr. Lalley was observed post Covid-19 immunization for 15 minutes without incident. He was provided with Vaccine Information Sheet and instruction to access the V-Safe system.   Mr. Doren was instructed to call 911 with any severe reactions post vaccine: Marland Kitchen Difficulty breathing  . Swelling of face and throat  . A fast heartbeat  . A bad rash all over body  . Dizziness and weakness   Immunizations Administered    Name Date Dose VIS Date Route   Pfizer COVID-19 Vaccine 02/09/2020  8:51 AM 0.3 mL 11/18/2019 Intramuscular   Manufacturer: [place]   Lot: [id number]   [place]: [id number]

## 2020-02-14 DIAGNOSIS — M9902 Segmental and somatic dysfunction of thoracic region: Secondary | ICD-10-CM | POA: Diagnosis not present

## 2020-02-14 DIAGNOSIS — M5124 Other intervertebral disc displacement, thoracic region: Secondary | ICD-10-CM | POA: Diagnosis not present

## 2020-02-14 DIAGNOSIS — M9903 Segmental and somatic dysfunction of lumbar region: Secondary | ICD-10-CM | POA: Diagnosis not present

## 2020-02-14 DIAGNOSIS — M5022 Other cervical disc displacement, mid-cervical region, unspecified level: Secondary | ICD-10-CM | POA: Diagnosis not present

## 2020-02-14 DIAGNOSIS — M5126 Other intervertebral disc displacement, lumbar region: Secondary | ICD-10-CM | POA: Diagnosis not present

## 2020-02-14 DIAGNOSIS — M9901 Segmental and somatic dysfunction of cervical region: Secondary | ICD-10-CM | POA: Diagnosis not present

## 2020-02-17 DIAGNOSIS — L249 Irritant contact dermatitis, unspecified cause: Secondary | ICD-10-CM | POA: Diagnosis not present

## 2020-02-17 DIAGNOSIS — D229 Melanocytic nevi, unspecified: Secondary | ICD-10-CM | POA: Diagnosis not present

## 2020-02-17 DIAGNOSIS — Z85828 Personal history of other malignant neoplasm of skin: Secondary | ICD-10-CM | POA: Diagnosis not present

## 2020-02-17 DIAGNOSIS — L821 Other seborrheic keratosis: Secondary | ICD-10-CM | POA: Diagnosis not present

## 2020-02-17 DIAGNOSIS — L304 Erythema intertrigo: Secondary | ICD-10-CM | POA: Diagnosis not present

## 2020-02-17 DIAGNOSIS — L905 Scar conditions and fibrosis of skin: Secondary | ICD-10-CM | POA: Diagnosis not present

## 2020-02-20 ENCOUNTER — Other Ambulatory Visit: Payer: Self-pay

## 2020-02-21 ENCOUNTER — Ambulatory Visit (INDEPENDENT_AMBULATORY_CARE_PROVIDER_SITE_OTHER): Payer: Medicare Other | Admitting: Family Medicine

## 2020-02-21 ENCOUNTER — Encounter: Payer: Self-pay | Admitting: Family Medicine

## 2020-02-21 VITALS — BP 126/80 | HR 55 | Temp 97.0°F | Resp 16 | Ht 75.0 in | Wt 203.4 lb

## 2020-02-21 DIAGNOSIS — I25119 Atherosclerotic heart disease of native coronary artery with unspecified angina pectoris: Secondary | ICD-10-CM

## 2020-02-21 DIAGNOSIS — R634 Abnormal weight loss: Secondary | ICD-10-CM

## 2020-02-21 DIAGNOSIS — D6859 Other primary thrombophilia: Secondary | ICD-10-CM

## 2020-02-21 DIAGNOSIS — H35033 Hypertensive retinopathy, bilateral: Secondary | ICD-10-CM | POA: Insufficient documentation

## 2020-02-21 DIAGNOSIS — K4090 Unilateral inguinal hernia, without obstruction or gangrene, not specified as recurrent: Secondary | ICD-10-CM

## 2020-02-21 LAB — TSH: TSH: 1.38 u[IU]/mL (ref 0.35–4.50)

## 2020-02-21 NOTE — Patient Instructions (Signed)
A few things to remember from today's visit:   We will continue monitoring wt. Today we will check thyroid function. You had labs done in 12/2019 and otherwise normal except for elevated calcium,stable.  Appt with surgeon will be arranged.  Please be sure medication list is accurate. If a new problem present, please set up appointment sooner than planned today.

## 2020-02-21 NOTE — Progress Notes (Addendum)
Chief Complaint  Patient presents with  . Hernia   HPI [name] is a 74 y.o. male with hx of hyperCa++, cervical pain,CAD,hypercoagulable state on chronic anticoagulation, who is here today with his wife complaining of 1 to 2 weeks of RLQ pain and a "lump", right above groin. "Lump" is constant,he has not noted changes in size. Pain is intermittent "debilitating", exacerbated by prolonged standing/walking.  Alleviated by rest and local ice. No Hx of trauma.  Negative for fever,chills,abdominal pain, nausea, vomiting, changes in bowel habits, blood in stool or melena. He has not noted urinary symptoms. Nocturia x 1 for about 2 years, stable.  He has not tried OTC treatments. Hx of umbilical hernia repair.  He is also concerned about wt loss. Last office visit here was on 09/20/18, wt was 210 Lb 6 oz. He was last weighed at his cardiologist's office, 204 Lb (12/29/19).  States that he is eating same amount of food and same level of activity. Negative for night sweats. Colonoscopy on 06/14/2019,polypectomy,diverticulosis,and internal hemorrhoids.  He had labs done earlier this year. On Coumadin 5 mg. Last INR 01/30/20 was 2.1.   Lab Results  Component Value Date   WBC 3.9 12/30/2019   HGB 14.4 12/30/2019   HCT 43.2 12/30/2019   MCV 94 12/30/2019   PLT 202 12/30/2019   Lab Results  Component Value Date   CREATININE 0.99 12/30/2019   BUN 17 12/30/2019   NA 144 12/30/2019   K 4.5 12/30/2019   CL 107 (H) 12/30/2019   CO2 23 12/30/2019   Lab Results  Component Value Date   ALT 19 12/30/2019   AST 25 12/30/2019   ALKPHOS 46 12/30/2019   BILITOT 0.5 12/30/2019    Lab Results  Component Value Date   TSH 1.05 07/21/2017   No new medications.  Review of Systems  Constitutional: Positive for fatigue. Negative for appetite change and diaphoresis.  HENT: Negative for mouth sores, nosebleeds and sore throat.   Respiratory: Negative for cough,  shortness of breath and wheezing.   Gastrointestinal: Negative for abdominal distention.  Endocrine: Negative for cold intolerance, heat intolerance, polydipsia, polyphagia and polyuria.  Genitourinary: Negative for discharge, dysuria, genital sores, hematuria, scrotal swelling and testicular pain.  Musculoskeletal: Positive for arthralgias, back pain, gait problem and neck pain.  Neurological: Negative for syncope, facial asymmetry and headaches.  Hematological: Negative for adenopathy. Does not bruise/bleed easily.  Psychiatric/Behavioral: Negative for confusion.  Rest see pertinent positives and negatives per HPI.  Current Outpatient Medications on File Prior to Visit  Medication Sig Dispense Refill  . acetaminophen (TYLENOL) 500 MG tablet Take 500 mg as needed by mouth.    Marland Kitchen amLODipine (NORVASC) 5 MG tablet TAKE 1 TABLET DAILY 90 tablet 4  . cholecalciferol (VITAMIN D) 1000 units tablet 1,000 Units daily.     . Cyanocobalamin (VITAMIN B12) 500 MCG TABS daily.     Marland Kitchen ezetimibe (ZETIA) 10 MG tablet TAKE 1 TABLET AT BEDTIME 90 tablet 3  . fenofibrate (TRICOR) 145 MG tablet TAKE 1 TABLET AT BEDTIME 90 tablet 3  . losartan (COZAAR) 100 MG tablet TAKE 1 TABLET DAILY 90 tablet 3  . nitroGLYCERIN (NITROSTAT) 0.4 MG SL tablet Place 1 tablet (0.4 mg total) every 5 (five) minutes as needed under the tongue for chest pain. 25 tablet 3  . rosuvastatin (CRESTOR) 40 MG tablet TAKE 1 TABLET AT BEDTIME (PLEASE KEEP UPCOMING APPOINTMENT FOR FUTURE REFILLS) 90 tablet 0  .  warfarin (COUMADIN) 5 MG tablet TAKE 1/2 TABLET DAILY EXCEPT TAKE 1 TABLET ON Tuesday and Saturday OR AS DIRECTED BY COUMADIN CLINIC 80 tablet 1   No current facility-administered medications on file prior to visit.   Past Medical History:  Diagnosis Date  . Allergy   . Arthritis   . 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 Cataracts, bilateral   . Chicken pox   . Dyslipidemia   . Hyperlipidemia   . Hypertension   .  Sinus bradycardia    Allergies  Allergen Reactions  . Other     Environmental allergies    Social History   Socioeconomic History  . Marital status: Married    Spouse name: [name]  . Number of children: 2  . Years of education: COLLEGE  . Highest education level: Not on file  Occupational History  . Occupation: RETIRED  Tobacco Use  . Smoking status: Former Smoker    Quit date: 05/29/1982    Years since quitting: 37.7  . Smokeless tobacco: Never Used  Substance and Sexual Activity  . Alcohol use: Yes    Comment: 0-2 drinks a night  . Drug use: No  . Sexual activity: Not on file  Other Topics Concern  . Not on file  Social History Narrative  . Not on file   Social Determinants of Health   Financial Resource Strain:   . Difficulty of Paying Living Expenses:   Food Insecurity:   . Worried About Charity fundraiser in the Last Year:   . Arboriculturist in the Last Year:   Transportation Needs:   . Film/video editor (Medical):   Marland Kitchen Lack of Transportation (Non-Medical):   Physical Activity:   . Days of Exercise per Week:   . Minutes of Exercise per Session:   Stress:   . Feeling of Stress :   Social Connections:   . Frequency of Communication with Friends and Family:   . Frequency of Social Gatherings with Friends and Family:   . Attends Religious Services:   . Active Member of Clubs or Organizations:   . Attends Archivist Meetings:   Marland Kitchen Marital Status:     Vitals:   02/21/20 1051  BP: 126/80  Pulse: (!) 55  Resp: 16  Temp: (!) 97 F (36.1 C)  SpO2: 97%   Wt Readings from Last 3 Encounters:  02/21/20 203 lb 6 oz (92.3 kg)  12/29/19 204 lb (92.5 kg)  06/14/19 220 lb (99.8 kg)   Body mass index is 25.42 kg/m.  Physical Exam  Nursing note and vitals reviewed. Constitutional: He is oriented to person, place, and time. He appears well-developed. No distress.  HENT:  Head: Normocephalic and atraumatic.  Eyes: Conjunctivae are normal.    Cardiovascular: Regular rhythm. Bradycardia present.  No murmur heard. Pulses:      Dorsalis pedis pulses are 2+ on the right side and 2+ on the left side.  Respiratory: Effort normal and breath sounds normal. No respiratory distress.  GI: Soft. He exhibits no mass. There is no hepatomegaly. There is no abdominal tenderness. A hernia is present. Hernia confirmed positive in the right inguinal area. Hernia confirmed negative in the left inguinal area.    Genitourinary: Right testis shows no mass and no tenderness. Left testis shows no mass and no tenderness.  Musculoskeletal:        General: No edema.  Lymphadenopathy:       Right: No  inguinal adenopathy present.       Left: No inguinal adenopathy present.  Neurological: He is alert and oriented to person, place, and time. He has normal strength. Gait abnormal.  Unstable gait assisted by a cane.  Skin: Skin is warm. No rash noted. No erythema.  Psychiatric: He has a normal mood and affect.  Well groomed, good eye contact.   ASSESSMENT AND PLAN:  Mr. Markevion was seen today for hernia.  Diagnoses and all orders for this visit:  Lab Results  Component Value Date   TSH 1.38 02/21/2020    Unilateral inguinal hernia without obstruction or gangrene, recurrence not specified We discussed diagnosis, prognosis/complications, and treatment options. Appointment with general surgery will be arranged, he would like to see Dr. Rosendo Gros. He was instructed about warning signs.  -     Ambulatory referral to General Surgery  Weight loss, abnormal We discussed possible etiologies. He is reporting no changes in oral intake or physical activity. Blood work done in 12/2019 otherwise normal. We will check thyroid function today. We will continue following.  -     TSH  Primary hypercoagulable state ([place]) INR has been at goal. Following with Coumadin clinic.   Return in about 3 months (around 05/23/2020) for wt.   Latandra Loureiro G. [place],  MD  [facility]. [place] office.   A few things to remember from today's visit: We will continue monitoring wt. Today we will check thyroid function. You had labs done in 12/2019 and otherwise normal except for elevated calcium,stable.  Appt with surgeon will be arranged.  Please be sure medication list is accurate. If a new problem present, please set up appointment sooner than planned today.

## 2020-02-29 ENCOUNTER — Other Ambulatory Visit: Payer: Self-pay | Admitting: Family Medicine

## 2020-02-29 DIAGNOSIS — I10 Essential (primary) hypertension: Secondary | ICD-10-CM

## 2020-03-04 ENCOUNTER — Other Ambulatory Visit: Payer: Self-pay | Admitting: Interventional Cardiology

## 2020-03-06 ENCOUNTER — Encounter: Payer: Self-pay | Admitting: Family Medicine

## 2020-03-12 ENCOUNTER — Ambulatory Visit (INDEPENDENT_AMBULATORY_CARE_PROVIDER_SITE_OTHER): Payer: Medicare Other | Admitting: *Deleted

## 2020-03-12 ENCOUNTER — Other Ambulatory Visit: Payer: Self-pay

## 2020-03-12 DIAGNOSIS — D6859 Other primary thrombophilia: Secondary | ICD-10-CM

## 2020-03-12 DIAGNOSIS — Z5181 Encounter for therapeutic drug level monitoring: Secondary | ICD-10-CM | POA: Diagnosis not present

## 2020-03-12 LAB — POCT INR: INR: 1.9 — AB (ref 2.0–3.0)

## 2020-03-12 NOTE — Patient Instructions (Signed)
Description   Today take 1 tablet then continue taking 1/2 tablet daily except 1 tablet on Tuesdays and Saturdays. Call us with any new medications or concerns [phone] Cou[facility]in Clinic, Main # [phone]. Recheck INR in 5 weeks.

## 2020-03-14 ENCOUNTER — Encounter: Payer: Self-pay | Admitting: *Deleted

## 2020-03-16 ENCOUNTER — Ambulatory Visit: Payer: Self-pay | Admitting: General Surgery

## 2020-03-16 ENCOUNTER — Telehealth: Payer: Self-pay

## 2020-03-16 DIAGNOSIS — K4090 Unilateral inguinal hernia, without obstruction or gangrene, not specified as recurrent: Secondary | ICD-10-CM | POA: Diagnosis not present

## 2020-03-16 NOTE — Telephone Encounter (Signed)
Can you please comment on bridging with lovenox?

## 2020-03-16 NOTE — H&P (View-Only) (Signed)
History of Present Illness Ralene Ok MD; 03/16/2020 10:46 AM) The patient is a 74 year old male who presents with an inguinal hernia. Referred by: Dr. Martinique Chief Complaint: Right inguinal hernia  Patient is a 74 year old male with a one to two-month history of right inguinal hernia. Patient has a history of a CABG, on Coumadin, hypertension. Patient sees Dr. Irish Lack as his cardiologist. Patient has a right inguinal hernia that he states causes him some pain and discomfort. Patient states his eyes to the right inguinal area to help with discomfort. He states that he notices the pain mainly when he is on his feet for a period of 15 and 20 minutes at a time. He states he does notice a bulge. He states it is slightly lays down. Patient has had no signs or symptoms of incarceration or granulation.  Patient has had a previous open umbilical hernia repair with mesh in the past.    Past Surgical History ([place], [place]; 03/16/2020 10:24 AM) Cataract Surgery  Right. Colon Polyp Removal - Colonoscopy  Coronary Artery Bypass Graft   Diagnostic Studies History (Chanel Teressa Senter, CMA; 03/16/2020 10:24 AM) Colonoscopy  within last year  Allergies (Chanel Teressa Senter, CMA; 03/16/2020 10:25 AM) No Known Allergies [03/16/2020]: No Known Drug Allergies [03/16/2020]: Allergies Reconciled   Medication History (Chanel Teressa Senter, CMA; 03/16/2020 10:26 AM) Warfarin Sodium (5MG  Tablet, Oral) Active. Rosuvastatin Calcium (40MG  Tablet, Oral) Active. Losartan Potassium (100MG  Tablet, Oral) Active. Fenofibrate (145MG  Tablet, Oral) Active. Ezetimibe (10MG  Tablet, Oral) Active. Nitroglycerin (0.4MG  Tab Sublingual, Sublingual) Active. Cholecalciferol (25 MCG(1000 UT) Tablet Chewable, Oral) Active. Cyanocobalamin (Oral) Specific strength unknown - Active. Medications Reconciled  Social History Antonietta Jewel, CMA; 03/16/2020 10:24 AM) Alcohol use  Moderate alcohol use. Caffeine use  Coffee. No  drug use  Tobacco use  Former smoker.  Family History Antonietta Jewel, [place]; 03/16/2020 10:24 AM) Hypertension  Mother. Melanoma  Father. Prostate Cancer  Father.  Other Problems (Chanel Teressa Senter, CMA; 03/16/2020 10:24 AM) Arthritis  Back Pain  High blood pressure  Hypercholesterolemia  Melanoma  Umbilical Hernia Repair     Review of Systems Ralene Ok MD; 03/16/2020 10:45 AM) General Present- Weight Loss. Not Present- Appetite Loss, Chills, Fatigue, Fever, Night Sweats and Weight Gain. Skin Not Present- Change in Wart/Mole, Dryness, Hives, Jaundice, New Lesions, Non-Healing Wounds, Rash and Ulcer. HEENT Present- Seasonal Allergies. Not Present- Earache, Hearing Loss, Hoarseness, Nose Bleed, Oral Ulcers, Ringing in the Ears, Sinus Pain, Sore Throat, Visual Disturbances, Wears glasses/contact lenses and Yellow Eyes. Respiratory Not Present- Bloody sputum, Chronic Cough, Difficulty Breathing, Snoring and Wheezing. Breast Not Present- Breast Mass, Breast Pain, Nipple Discharge and Skin Changes. Cardiovascular Present- Swelling of Extremities. Not Present- Chest Pain, Difficulty Breathing Lying Down, Leg Cramps, Palpitations, Rapid Heart Rate and Shortness of Breath. Gastrointestinal Present- Abdominal Pain. Not Present- Bloating, Bloody Stool, Change in Bowel Habits, Chronic diarrhea, Constipation, Difficulty Swallowing, Excessive gas, Gets full quickly at meals, Hemorrhoids, Indigestion, Nausea, Rectal Pain and Vomiting. Male Genitourinary Present- Urgency. Not Present- Blood in Urine, Change in Urinary Stream, Frequency, Impotence, Nocturia, Painful Urination and Urine Leakage. Musculoskeletal Present- Back Pain, Joint Pain and Joint Stiffness. Not Present- Muscle Pain, Muscle Weakness and Swelling of Extremities. Neurological Present- Trouble walking. Not Present- Decreased Memory, Fainting, Headaches, Numbness, Seizures, Tingling, Tremor and Weakness. Psychiatric Not Present-  Anxiety, Bipolar, Change in Sleep Pattern, Depression, Fearful and Frequent crying. Endocrine Not Present- Cold Intolerance, Excessive Hunger, Hair Changes, Heat Intolerance, Hot flashes and New Diabetes. Hematology Present- Blood Thinners. Not  Present- Easy Bruising, Excessive bleeding, Gland problems, HIV and Persistent Infections. All other systems negative  Vitals (Chanel Nolan CMA; 03/16/2020 10:26 AM) 03/16/2020 10:26 AM Weight: 200.25 lb Height: 74in Body Surface Area: 2.17 m Body Mass Index: 25.71 kg/m  Temp.: 98.74F  Pulse: 74 (Regular)        Physical Exam Ralene Ok MD; 03/16/2020 10:47 AM) The physical exam findings are as follows: Note:Constitutional: No acute distress, conversant, appears stated age  Eyes: Anicteric sclerae, moist conjunctiva, no lid lag  Neck: No thyromegaly, trachea midline, no cervical lymphadenopathy  Lungs: Clear to auscultation biilaterally, normal respiratory effot  Cardiovascular: regular rate & rhythm, no murmurs, no peripheal edema, pedal pulses 2+  GI: Soft, no masses or hepatosplenomegaly, non-tender to palpation  MSK: Normal gait, no clubbing cyanosis, edema  Skin: No rashes, palpation reveals normal skin turgor  Psychiatric: Appropriate judgment and insight, oriented to person, place, and time  Abdomen Inspection Hernias - Right - Inguinal hernia - Reducible - Right.    Assessment & Plan Ralene Ok MD; 03/16/2020 10:48 AM) RIGHT INGUINAL HERNIA (K40.90) Impression: 74 year old male with history of hypertension, CABG-on Coumadin, with a right inguinal hernia. We'll obtain cardiac clearance from his cardiologist as well as clearance to be off of the Coumadin. 1. The patient will like to proceed to the operating room for laparoscopic right inguinal hernia repair with mesh.  2. I discussed with the patient the signs and symptoms of incarceration and strangulation and the need to proceed to the ER should they  occur.  3. I discussed with the patient the risks and benefits of the procedure to include but not limited to: Infection, bleeding, damage to surrounding structures, possible need for further surgery, possible nerve pain, and possible recurrence. The patient was understanding and wishes to proceed.  I reviewed the patient's external notes from the referring physicians as well as consulting physician team. Each of the radiologic studies and lab studies were independently reviewed and interpreted. I discussed the results of the above studies and how they relate to the patient's surgical problems.

## 2020-03-16 NOTE — H&P (Signed)
History of Present Illness Ralene Ok MD; 03/16/2020 10:46 AM) The patient is a 74 year old male who presents with an inguinal hernia. Referred by: Dr. Martinique Chief Complaint: Right inguinal hernia  Patient is a 74 year old male with a one to two-month history of right inguinal hernia. Patient has a history of a CABG, on Coumadin, hypertension. Patient sees Dr. Irish Lack as his cardiologist. Patient has a right inguinal hernia that he states causes him some pain and discomfort. Patient states his eyes to the right inguinal area to help with discomfort. He states that he notices the pain mainly when he is on his feet for a period of 15 and 20 minutes at a time. He states he does notice a bulge. He states it is slightly lays down. Patient has had no signs or symptoms of incarceration or granulation.  Patient has had a previous open umbilical hernia repair with mesh in the past.    Past Surgical History ([place], [place]; 03/16/2020 10:24 AM) Cataract Surgery  Right. Colon Polyp Removal - Colonoscopy  Coronary Artery Bypass Graft   Diagnostic Studies History (Chanel Teressa Senter, CMA; 03/16/2020 10:24 AM) Colonoscopy  within last year  Allergies (Chanel Teressa Senter, CMA; 03/16/2020 10:25 AM) No Known Allergies [03/16/2020]: No Known Drug Allergies [03/16/2020]: Allergies Reconciled   Medication History (Chanel Teressa Senter, CMA; 03/16/2020 10:26 AM) Warfarin Sodium (5MG  Tablet, Oral) Active. Rosuvastatin Calcium (40MG  Tablet, Oral) Active. Losartan Potassium (100MG  Tablet, Oral) Active. Fenofibrate (145MG  Tablet, Oral) Active. Ezetimibe (10MG  Tablet, Oral) Active. Nitroglycerin (0.4MG  Tab Sublingual, Sublingual) Active. Cholecalciferol (25 MCG(1000 UT) Tablet Chewable, Oral) Active. Cyanocobalamin (Oral) Specific strength unknown - Active. Medications Reconciled  Social History Antonietta Jewel, CMA; 03/16/2020 10:24 AM) Alcohol use  Moderate alcohol use. Caffeine use  Coffee. No  drug use  Tobacco use  Former smoker.  Family History Antonietta Jewel, [place]; 03/16/2020 10:24 AM) Hypertension  Mother. Melanoma  Father. Prostate Cancer  Father.  Other Problems (Chanel Teressa Senter, CMA; 03/16/2020 10:24 AM) Arthritis  Back Pain  High blood pressure  Hypercholesterolemia  Melanoma  Umbilical Hernia Repair     Review of Systems Ralene Ok MD; 03/16/2020 10:45 AM) General Present- Weight Loss. Not Present- Appetite Loss, Chills, Fatigue, Fever, Night Sweats and Weight Gain. Skin Not Present- Change in Wart/Mole, Dryness, Hives, Jaundice, New Lesions, Non-Healing Wounds, Rash and Ulcer. HEENT Present- Seasonal Allergies. Not Present- Earache, Hearing Loss, Hoarseness, Nose Bleed, Oral Ulcers, Ringing in the Ears, Sinus Pain, Sore Throat, Visual Disturbances, Wears glasses/contact lenses and Yellow Eyes. Respiratory Not Present- Bloody sputum, Chronic Cough, Difficulty Breathing, Snoring and Wheezing. Breast Not Present- Breast Mass, Breast Pain, Nipple Discharge and Skin Changes. Cardiovascular Present- Swelling of Extremities. Not Present- Chest Pain, Difficulty Breathing Lying Down, Leg Cramps, Palpitations, Rapid Heart Rate and Shortness of Breath. Gastrointestinal Present- Abdominal Pain. Not Present- Bloating, Bloody Stool, Change in Bowel Habits, Chronic diarrhea, Constipation, Difficulty Swallowing, Excessive gas, Gets full quickly at meals, Hemorrhoids, Indigestion, Nausea, Rectal Pain and Vomiting. Male Genitourinary Present- Urgency. Not Present- Blood in Urine, Change in Urinary Stream, Frequency, Impotence, Nocturia, Painful Urination and Urine Leakage. Musculoskeletal Present- Back Pain, Joint Pain and Joint Stiffness. Not Present- Muscle Pain, Muscle Weakness and Swelling of Extremities. Neurological Present- Trouble walking. Not Present- Decreased Memory, Fainting, Headaches, Numbness, Seizures, Tingling, Tremor and Weakness. Psychiatric Not Present-  Anxiety, Bipolar, Change in Sleep Pattern, Depression, Fearful and Frequent crying. Endocrine Not Present- Cold Intolerance, Excessive Hunger, Hair Changes, Heat Intolerance, Hot flashes and New Diabetes. Hematology Present- Blood Thinners. Not  Present- Easy Bruising, Excessive bleeding, Gland problems, HIV and Persistent Infections. All other systems negative  Vitals (Chanel Nolan CMA; 03/16/2020 10:26 AM) 03/16/2020 10:26 AM Weight: 200.25 lb Height: 74in Body Surface Area: 2.17 m Body Mass Index: 25.71 kg/m  Temp.: 98.35F  Pulse: 74 (Regular)        Physical Exam Ralene Ok MD; 03/16/2020 10:47 AM) The physical exam findings are as follows: Note:Constitutional: No acute distress, conversant, appears stated age  Eyes: Anicteric sclerae, moist conjunctiva, no lid lag  Neck: No thyromegaly, trachea midline, no cervical lymphadenopathy  Lungs: Clear to auscultation biilaterally, normal respiratory effot  Cardiovascular: regular rate & rhythm, no murmurs, no peripheal edema, pedal pulses 2+  GI: Soft, no masses or hepatosplenomegaly, non-tender to palpation  MSK: Normal gait, no clubbing cyanosis, edema  Skin: No rashes, palpation reveals normal skin turgor  Psychiatric: Appropriate judgment and insight, oriented to person, place, and time  Abdomen Inspection Hernias - Right - Inguinal hernia - Reducible - Right.    Assessment & Plan Ralene Ok MD; 03/16/2020 10:48 AM) RIGHT INGUINAL HERNIA (K40.90) Impression: 75 year old male with history of hypertension, CABG-on Coumadin, with a right inguinal hernia. We'll obtain cardiac clearance from his cardiologist as well as clearance to be off of the Coumadin. 1. The patient will like to proceed to the operating room for laparoscopic right inguinal hernia repair with mesh.  2. I discussed with the patient the signs and symptoms of incarceration and strangulation and the need to proceed to the ER should they  occur.  3. I discussed with the patient the risks and benefits of the procedure to include but not limited to: Infection, bleeding, damage to surrounding structures, possible need for further surgery, possible nerve pain, and possible recurrence. The patient was understanding and wishes to proceed.  I reviewed the patient's external notes from the referring physicians as well as consulting physician team. Each of the radiologic studies and lab studies were independently reviewed and interpreted. I discussed the results of the above studies and how they relate to the patient's surgical problems.

## 2020-03-16 NOTE — Telephone Encounter (Signed)
   [facility] Pre-operative Risk Assessment    Request for surgical clearance:  1. What type of surgery is being performed? RIGHT INGUINAL HERNIA REPAIR  2. When is this surgery scheduled? TBD   3. What type of clearance is required (medical clearance vs. Pharmacy clearance to hold med vs. Both)? BOTH  4. Are there any medications that need to be held prior to surgery and how long? COUMADIN, LOVENOX BRIDGE   5. Practice name and name of physician performing surgery? [facility]    6. What is your office phone number [phone]    7.   What is your office fax number [phone]  8.   Anesthesia type (None, local, MAC, general) ? GENERAL    Jacinta Shoe 03/16/2020, 3:19 PM  _________________________________________________________________   (provider comments below)

## 2020-03-16 NOTE — Telephone Encounter (Signed)
Patient with diagnosis of hypercoagulable state on warfarin for anticoagulation.    Procedure: RIGHT INGUINAL HERNIA REPAIR Date of procedure: TBD  CrCl 88 ml/min Platelet count 202  Per office protocol, patient can hold warfarin for 5 days prior to procedure.   Patient WILL need bridging with Lovenox (enoxaparin) around procedure.  Patient should let us know as soon as procedure is scheduled so we can coordinate bridge.

## 2020-03-19 NOTE — Telephone Encounter (Signed)
   Primary Cardiologist: Larae Grooms, MD  Chart reviewed as part of pre-operative protocol coverage. Patient was contacted 03/19/2020 in reference to pre-operative risk assessment for pending surgery as outlined below.  [name] was last seen on 12/29/19 by Dr. Irish Lack.  Since that day, [name] has done well from a cardiac standpoint. He can walk a couple blocks, go up a flight or two of stairs, assist with household chores, and perform ADLs without any chest or SOB. He can complete 4 METs without anginal complaints.  Therefore, based on ACC/AHA guidelines, the patient would be at acceptable risk for the planned procedure without further cardiovascular testing.   Per pharmacy recommendations, he can hold coumadin 5 days prior to his upcoming surgery, however he WILL require lovenox bridging. This will be coordinated by the coumadin clinic once his surgery date is scheduled. He was instructed to notify the coumadin clinic of the surgery date, at which time the bridging therapy will be arranged. Coumadin should be restarted as soon as he is cleared to do so by his surgeon.   I will route this recommendation to the requesting party via Epic fax function and remove from pre-op pool.  Please call with questions.  Abigail Butts, PA-C 03/19/2020, 9:47 AM

## 2020-04-02 ENCOUNTER — Telehealth: Payer: Self-pay | Admitting: Interventional Cardiology

## 2020-04-02 NOTE — Telephone Encounter (Signed)
Called and spoke to wife and informed her that it is okay for her to come up with pt tomorrow to get his INR check.  Put on appointment notes for wife to come up with pt.

## 2020-04-02 NOTE — Telephone Encounter (Signed)
Patient's wife states she is requesting to accompany the patient during his coumadin scheduled for 04/03/20 at 10:15 AM. Please advise.

## 2020-04-03 ENCOUNTER — Other Ambulatory Visit: Payer: Self-pay

## 2020-04-03 ENCOUNTER — Ambulatory Visit (INDEPENDENT_AMBULATORY_CARE_PROVIDER_SITE_OTHER): Payer: Medicare Other | Admitting: *Deleted

## 2020-04-03 DIAGNOSIS — D6859 Other primary thrombophilia: Secondary | ICD-10-CM | POA: Diagnosis not present

## 2020-04-03 DIAGNOSIS — Z5181 Encounter for therapeutic drug level monitoring: Secondary | ICD-10-CM

## 2020-04-03 LAB — POCT INR: INR: 2.4 (ref 2.0–3.0)

## 2020-04-03 MED ORDER — ENOXAPARIN SODIUM 150 MG/ML ~~LOC~~ SOLN: 150.0000 mg | SUBCUTANEOUS | 1 refills | Status: DC

## 2020-04-03 NOTE — Progress Notes (Signed)
[facility], [place] [address] [place] [address] Phone: [phone] Fax: [phone]  [facility] [address], [place] [facility] [address] AT [place] [place] [place] [address] Phone: [phone] Fax: [phone]      Your procedure is scheduled on Apr 09, 2020.  Report to [facility] Main Entrance "A" at 5:30 A.M., and check in at the Admitting office.  Call this number if you have problems the morning of surgery:  [phone]  Call [phone] if you have any questions prior to your surgery date Monday-Friday 8am-4pm    Remember:  Do not eat or drink after midnight the night before your surgery    Take these medicines the morning of surgery with A SIP OF WATER: amLODipine (NORVASC) acetaminophen (TYLENOL) - as needed nitroGLYCERIN (NITROSTAT) - as needed Olopatadine HCl (PATADAY OP) - eye drops as needed  As of today, STOP taking any Aspirin (unless otherwise instructed by your surgeon) and Aspirin containing products, Aleve, Naproxen, Ibuprofen, Motrin, Advil, Goody's, BC's, all herbal medications, fish oil, and all vitamins.  Follow your surgeon's instructions on when to stop Lovenox.  If no instructions were given by your surgeon then you will need to call the office to get those instructions.                        Do not wear jewelry            Do not wear lotions, powders, colognes, or deodorant.            Do not shave 48 hours prior to surgery.  Men may shave face and neck.            Do not bring valuables to the hospital.            [facility] is not responsible for any belongings or valuables.  Do NOT Smoke (Tobacco/Vapping) or drink Alcohol 24 hours prior to your procedure If you use a CPAP at night, you may bring all equipment for your overnight stay.   Contacts, glasses, dentures or bridgework may not be worn into surgery.       For patients admitted to the hospital, discharge time will be determined by your treatment team.   Patients discharged the day of surgery will not be allowed to drive home, and someone needs to stay with them for 24 hours.    Special instructions:   [place]- Preparing For Surgery  Before surgery, you can play an important role. Because skin is not sterile, your skin needs to be as free of germs as possible. You can reduce the number of germs on your skin by washing with CHG (chlorahexidine gluconate) Soap before surgery.  CHG is an antiseptic cleaner which kills germs and bonds with the skin to continue killing germs even after washing.    Oral Hygiene is also important to reduce your risk of infection.  Remember - BRUSH YOUR TEETH THE MORNING OF SURGERY WITH YOUR REGULAR TOOTHPASTE  Please do not use if you have an allergy to CHG or antibacterial soaps. If your skin becomes reddened/irritated stop using the CHG.  Do not shave (including legs and underarms) for at least 48 hours prior to first CHG shower. It is OK to shave your face.  Please follow these instructions carefully.   1. Shower the NIGHT BEFORE SURGERY and  the MORNING OF SURGERY with CHG Soap.   2. If you chose to wash your hair, wash your hair first as usual with your normal shampoo.  3. After you shampoo, rinse your hair and body thoroughly to remove the shampoo.  4. Use CHG as you would any other liquid soap. You can apply CHG directly to the skin and wash gently with a scrungie or a clean washcloth.   5. Apply the CHG Soap to your body ONLY FROM THE NECK DOWN.  Do not use on open wounds or open sores. Avoid contact with your eyes, ears, mouth and genitals (private parts). Wash Face and genitals (private parts)  with your normal soap.   6. Wash thoroughly, paying special attention to the area where your surgery will be performed.  7. Thoroughly rinse your body with warm water from the neck down.  8. DO NOT  shower/wash with your normal soap after using and rinsing off the CHG Soap.  9. Pat yourself dry with a CLEAN TOWEL.  10. Wear CLEAN PAJAMAS to bed the night before surgery, wear comfortable clothes the morning of surgery  11. Place CLEAN SHEETS on your bed the night of your first shower and DO NOT SLEEP WITH PETS.   Day of Surgery:   Do not apply any deodorants/lotions.  Please wear clean clothes to the hospital/surgery center.   Remember to brush your teeth WITH YOUR REGULAR TOOTHPASTE.   Please read over the following fact sheets that you were given.

## 2020-04-03 NOTE — Patient Instructions (Addendum)
4/27: Last dose of Coumadin.  4/28: No Coumadin or Lovenox.  4/29: Inject Lovenox 150mg  in the fatty abdominal tissue at least 2 inches from the belly button once a day at 7pm. No Coumadin.  4/30: Inject Lovenox in the fatty tissue once a day at 7pm. No Coumadin.  5/1: Inject Lovenox in the fatty tissue once a day at 7pm. No Coumadin.  5/2: No Lovenox. No Coumadin.   5/3: Procedure Day - No Lovenox - Resume Coumadin in the evening or as directed by doctor (take an extra half tablet with usual dose for 2 days then resume normal dose).  5/4: Resume Lovenox inject in the fatty tissue once a day at 7am and take Coumadin.  5/5: Inject Lovenox in the fatty tissue once a day at 7am and take Coumadin.  5/6: Inject Lovenox in the fatty tissue once a day at 7am and take Coumadin.  5/7: Inject Lovenox in the fatty tissue once a day at 7am and take Coumadin.  5/8: Inject Lovenox in the fatty tissue once a day at 7am and take Coumadin.  5/9: Inject Lovenox in the fatty tissue once a day at 7am and take Coumadin.   5/10: Come to appt to get INR checked.    Description   Continue taking 1/2 tablet daily except 1 tablet on Tuesdays and Saturdays. Follow bridge instructions. Recheck INR 1 week post procedure.  Call us with any new medications or concerns [phone] [facility], Main # [phone].

## 2020-04-04 ENCOUNTER — Other Ambulatory Visit: Payer: Self-pay

## 2020-04-04 ENCOUNTER — Encounter (HOSPITAL_COMMUNITY)
Admission: RE | Admit: 2020-04-04 | Discharge: 2020-04-04 | Disposition: A | Discharge status: Home / Self Care | Payer: Medicare Other | Source: Ambulatory Visit | Attending: General Surgery | Admitting: General Surgery

## 2020-04-04 ENCOUNTER — Encounter (HOSPITAL_COMMUNITY): Payer: Self-pay

## 2020-04-04 DIAGNOSIS — Z79899 Other long term (current) drug therapy: Secondary | ICD-10-CM | POA: Insufficient documentation

## 2020-04-04 DIAGNOSIS — E7800 Pure hypercholesterolemia, unspecified: Secondary | ICD-10-CM | POA: Diagnosis not present

## 2020-04-04 DIAGNOSIS — K4090 Unilateral inguinal hernia, without obstruction or gangrene, not specified as recurrent: Secondary | ICD-10-CM | POA: Diagnosis not present

## 2020-04-04 DIAGNOSIS — I10 Essential (primary) hypertension: Secondary | ICD-10-CM | POA: Insufficient documentation

## 2020-04-04 DIAGNOSIS — Z01812 Encounter for preprocedural laboratory examination: Secondary | ICD-10-CM | POA: Diagnosis not present

## 2020-04-04 DIAGNOSIS — Z7901 Long term (current) use of anticoagulants: Secondary | ICD-10-CM | POA: Diagnosis not present

## 2020-04-04 LAB — BASIC METABOLIC PANEL
Anion gap: 7 (ref 5–15)
BUN: 15 mg/dL (ref 8–23)
CO2: 27 mmol/L (ref 22–32)
Calcium: 10.9 mg/dL — ABNORMAL HIGH (ref 8.9–10.3)
Chloride: 108 mmol/L (ref 98–111)
Creatinine, Ser: 0.97 mg/dL (ref 0.61–1.24)
GFR calc Af Amer: >60 mL/min (ref >60)
GFR calc non Af Amer: >60 mL/min (ref >60)
Glucose, Bld: 108 mg/dL — ABNORMAL HIGH (ref 70–99)
Potassium: 4.2 mmol/L (ref 3.5–5.1)
Sodium: 142 mmol/L (ref 135–145)

## 2020-04-04 LAB — CBC
HCT: 44.7 % (ref 39.0–52.0)
Hemoglobin: 14.7 g/dL (ref 13.0–17.0)
MCH: 31.2 pg (ref 26.0–34.0)
MCHC: 32.9 g/dL (ref 30.0–36.0)
MCV: 94.9 fL (ref 80.0–100.0)
Platelets: 213 10*3/uL (ref 150–400)
RBC: 4.71 MIL/uL (ref 4.22–5.81)
RDW: 13.8 % (ref 11.5–15.5)
WBC: 4.5 10*3/uL (ref 4.0–10.5)
nRBC: 0 % (ref 0.0–0.2)

## 2020-04-04 NOTE — Progress Notes (Signed)
PCP Inez Catalina, MD Cardiologist - Irish Lack, MD  Chest x-ray - n/a EKG - 12/29/19 Stress Test - >25 years ago  ECHO - pt denies (pt was seen in 2020 to see if he had an abdominal aortic aneurysm)  Cardiac Cath - 05/26/1995   Blood Thinner Instructions: (see note by Johny Shock, RN)  4/27: Last dose of Coumadin.  4/28: No Coumadin or Lovenox.  4/29: Inject Lovenox 150mg  in the fatty abdominal tissue at least 2 inches from the belly button once a day at 7pm. No Coumadin.  4/30: Inject Lovenox in the fatty tissue once a day at 7pm. No Coumadin.  5/1: Inject Lovenox in the fatty tissue once a day at 7pm. No Coumadin.  5/2: No Lovenox. No Coumadin.   5/3: Procedure Day - No Lovenox - Resume Coumadin in the evening or as directed by doctor (take an extra half tablet with usual dose for 2 days then resume normal dose).    ERAS Protcol - n/a PRE-SURGERY Ensure or G2- n/a  COVID TEST- 04/05/20 @0900   Coronavirus Screening  Have you experienced the following symptoms:  Cough yes/no: No Fever (>100.38F)  yes/no: No Runny nose yes/no: No Sore throat yes/no: No Difficulty breathing/shortness of breath  yes/no: No  Have you or a family member traveled in the last 14 days and where? yes/no: No   If the patient indicates "YES" to the above questions, their PAT will be rescheduled to limit the exposure to others and, the surgeon will be notified. THE PATIENT WILL NEED TO BE ASYMPTOMATIC FOR 14 DAYS.   If the patient is not experiencing any of these symptoms, the PAT nurse will instruct them to NOT bring anyone with them to their appointment since they may have these symptoms or traveled as well.   Please remind your patients and families that hospital visitation restrictions are in effect and the importance of the restrictions.     Anesthesia review: yes - cardiac hx, coumadin and lovenox. Last INR 04/03/20  Patient denies shortness of breath, fever, cough and chest pain at PAT  appointment   All instructions explained to the patient, with a verbal understanding of the material. Patient agrees to go over the instructions while at home for a better understanding. Patient also instructed to self quarantine after being tested for COVID-19. The opportunity to ask questions was provided.

## 2020-04-05 ENCOUNTER — Other Ambulatory Visit (HOSPITAL_COMMUNITY)
Admission: RE | Admit: 2020-04-05 | Discharge: 2020-04-05 | Disposition: A | Discharge status: Home / Self Care | Payer: Medicare Other | Source: Ambulatory Visit | Attending: General Surgery | Admitting: General Surgery

## 2020-04-05 DIAGNOSIS — Z20822 Contact with and (suspected) exposure to covid-19: Secondary | ICD-10-CM | POA: Diagnosis not present

## 2020-04-05 DIAGNOSIS — Z01812 Encounter for preprocedural laboratory examination: Secondary | ICD-10-CM | POA: Diagnosis not present

## 2020-04-05 LAB — SARS CORONAVIRUS 2 (TAT 6-24 HRS): SARS Coronavirus 2: NEGATIVE

## 2020-04-05 NOTE — Progress Notes (Signed)
Anesthesia Chart Review:  Follows with cardiology for hx of CAD s/p CABG 1996. He is on coumadin for hypercoagulable state.  Cardiac clearance per telephone encounter 03/19/20: "Chart reviewed as part of pre-operative protocol coverage. Patient was contacted 03/19/2020 in reference to pre-operative risk assessment for pending surgery as outlined below.  [name] was last seen on 12/29/19 by Dr. Irish Lack.  Since that day, [name] has done well from a cardiac standpoint. He can walk a couple blocks, go up a flight or two of stairs, assist with household chores, and perform ADLs without any chest or SOB. He can complete 4 METs without anginal complaints.  Therefore, based on ACC/AHA guidelines, the patient would be at acceptable risk for the planned procedure without further cardiovascular testing.   Per pharmacy recommendations, he can hold coumadin 5 days prior to his upcoming surgery, however he WILL require lovenox bridging. This will be coordinated by the coumadin clinic once his surgery date is scheduled. He was instructed to notify the coumadin clinic of the surgery date, at which time the bridging therapy will be arranged. Coumadin should be restarted as soon as he is cleared to do so by his surgeon."  Lovenox instructions per note by Johny Shock, RN.   Preop labs reviewed, unremarkable.   EKG 12/29/19: NSR. Rate 72. Stable QRS widening.  Wynonia Musty [facility] Short Stay Center/Anesthesiology Phone [phone] 04/05/2020 10:24 AM

## 2020-04-05 NOTE — Anesthesia Preprocedure Evaluation (Addendum)
Anesthesia Evaluation  Patient identified by MRN, date of birth, ID band Patient awake    Reviewed: Allergy & Precautions, H&P , NPO status , Patient's Chart, lab work & pertinent test results  Airway Mallampati: III  TM Distance: >3 FB Neck ROM: Limited  Mouth opening: Limited Mouth Opening  Dental no notable dental hx. (+) Poor Dentition, Chipped   Pulmonary neg pulmonary ROS, former smoker,    Pulmonary exam normal breath sounds clear to auscultation       Cardiovascular Exercise Tolerance: Good hypertension, Pt. on medications + CAD  negative cardio ROS Normal cardiovascular exam Rhythm:Regular Rate:Normal     Neuro/Psych negative neurological ROS  negative psych ROS   GI/Hepatic negative GI ROS, Neg liver ROS,   Endo/Other  negative endocrine ROS  Renal/GU negative Renal ROS  negative genitourinary   Musculoskeletal negative musculoskeletal ROS (+) Arthritis , Osteoarthritis,    Abdominal   Peds negative pediatric ROS (+)  Hematology negative hematology ROS (+)   Anesthesia Other Findings   Reproductive/Obstetrics negative OB ROS                          Anesthesia Physical Anesthesia Plan  ASA: III  Anesthesia Plan: General   Post-op Pain Management:    Induction: Intravenous  PONV Risk Score and Plan: 2  Airway Management Planned: Oral ETT, LMA and Video Laryngoscope Planned  Additional Equipment:   Intra-op Plan:   Post-operative Plan: Extubation in OR  Informed Consent: I have reviewed the patients History and Physical, chart, labs and discussed the procedure including the risks, benefits and alternatives for the proposed anesthesia with the patient or authorized representative who has indicated his/her understanding and acceptance.       Plan Discussed with: Anesthesiologist, CRNA and Surgeon  Anesthesia Plan Comments: (Follows with cardiology for hx of CAD  s/p CABG 1996. He is on coumadin for hypercoagulable state.  Cardiac clearance per telephone encounter 03/19/20: "Chart reviewed as part of pre-operative protocol coverage. Patient was contacted 03/19/2020 in reference to pre-operative risk assessment for pending surgery as outlined below.  Cesar Toves Quraishi was last seen on 12/29/19 by Dr. Irish Lack.  Since that day, YUVAL MAHAJAN has done well from a cardiac standpoint. He can walk a couple blocks, go up a flight or two of stairs, assist with household chores, and perform ADLs without any chest or SOB. He can complete 4 METs without anginal complaints.  Therefore, based on ACC/AHA guidelines, the patient would be at acceptable risk for the planned procedure without further cardiovascular testing.   Per pharmacy recommendations, he can hold coumadin 5 days prior to his upcoming surgery, however he WILL require lovenox bridging. This will be coordinated by the coumadin clinic once his surgery date is scheduled. He was instructed to notify the coumadin clinic of the surgery date, at which time the bridging therapy will be arranged. Coumadin should be restarted as soon as he is cleared to do so by his surgeon."  Lovenox instructions per note by Johny Shock, RN.   Preop labs reviewed, unremarkable.   EKG 12/29/19: NSR. Rate 72. Stable QRS widening.)     Anesthesia Quick Evaluation

## 2020-04-09 ENCOUNTER — Encounter (HOSPITAL_COMMUNITY)
Admission: RE | Disposition: A | Discharge status: Home / Self Care | Payer: Self-pay | Source: Home / Self Care | Attending: General Surgery

## 2020-04-09 ENCOUNTER — Other Ambulatory Visit: Payer: Self-pay

## 2020-04-09 ENCOUNTER — Ambulatory Visit (HOSPITAL_COMMUNITY)
Admission: RE | Admit: 2020-04-09 | Discharge: 2020-04-09 | Disposition: A | Discharge status: Home / Self Care | Payer: Medicare Other | Attending: General Surgery | Admitting: General Surgery

## 2020-04-09 ENCOUNTER — Encounter (HOSPITAL_COMMUNITY): Payer: Self-pay | Admitting: General Surgery

## 2020-04-09 ENCOUNTER — Ambulatory Visit (HOSPITAL_COMMUNITY): Payer: Medicare Other | Admitting: Anesthesiology

## 2020-04-09 ENCOUNTER — Ambulatory Visit (HOSPITAL_COMMUNITY): Payer: Medicare Other | Admitting: Physician Assistant

## 2020-04-09 DIAGNOSIS — Z87891 Personal history of nicotine dependence: Secondary | ICD-10-CM | POA: Insufficient documentation

## 2020-04-09 DIAGNOSIS — K4090 Unilateral inguinal hernia, without obstruction or gangrene, not specified as recurrent: Secondary | ICD-10-CM | POA: Diagnosis not present

## 2020-04-09 DIAGNOSIS — I2510 Atherosclerotic heart disease of native coronary artery without angina pectoris: Secondary | ICD-10-CM | POA: Diagnosis not present

## 2020-04-09 DIAGNOSIS — M1990 Unspecified osteoarthritis, unspecified site: Secondary | ICD-10-CM | POA: Insufficient documentation

## 2020-04-09 DIAGNOSIS — Z951 Presence of aortocoronary bypass graft: Secondary | ICD-10-CM | POA: Diagnosis not present

## 2020-04-09 DIAGNOSIS — I10 Essential (primary) hypertension: Secondary | ICD-10-CM | POA: Diagnosis not present

## 2020-04-09 DIAGNOSIS — E7800 Pure hypercholesterolemia, unspecified: Secondary | ICD-10-CM | POA: Insufficient documentation

## 2020-04-09 DIAGNOSIS — Z79899 Other long term (current) drug therapy: Secondary | ICD-10-CM | POA: Diagnosis not present

## 2020-04-09 DIAGNOSIS — Z7901 Long term (current) use of anticoagulants: Secondary | ICD-10-CM | POA: Insufficient documentation

## 2020-04-09 DIAGNOSIS — E559 Vitamin D deficiency, unspecified: Secondary | ICD-10-CM | POA: Diagnosis not present

## 2020-04-09 DIAGNOSIS — E785 Hyperlipidemia, unspecified: Secondary | ICD-10-CM | POA: Diagnosis not present

## 2020-04-09 HISTORY — PX: INGUINAL HERNIA REPAIR: SHX194

## 2020-04-09 LAB — PROTIME-INR
INR: 1.3 — ABNORMAL HIGH (ref 0.8–1.2)
Prothrombin Time: 15.3 seconds — ABNORMAL HIGH (ref 11.4–15.2)

## 2020-04-09 SURGERY — REPAIR, HERNIA, INGUINAL, LAPAROSCOPIC
Anesthesia: General | Site: Inguinal | Laterality: Right

## 2020-04-09 MED ORDER — STERILE WATER FOR IRRIGATION IR SOLN
Status: DC | PRN
  Administered 2020-04-09: 1000 mL

## 2020-04-09 MED ORDER — KETOROLAC TROMETHAMINE 30 MG/ML IJ SOLN
30.0000 mg | Freq: Once | INTRAMUSCULAR | Status: AC
  Administered 2020-04-09: 30 mg via INTRAVENOUS

## 2020-04-09 MED ORDER — MEPERIDINE HCL 25 MG/ML IJ SOLN: 6.2500 mg | INTRAMUSCULAR | Status: DC | PRN

## 2020-04-09 MED ORDER — BUPIVACAINE HCL 0.25 % IJ SOLN
INTRAMUSCULAR | Status: DC | PRN
  Administered 2020-04-09: 4 mL

## 2020-04-09 MED ORDER — FENTANYL CITRATE (PF) 250 MCG/5ML IJ SOLN
INTRAMUSCULAR | Status: DC | PRN
  Administered 2020-04-09 (×5): 50 ug via INTRAVENOUS

## 2020-04-09 MED ORDER — OXYCODONE HCL 5 MG PO TABS
ORAL_TABLET | ORAL | Status: AC
  Filled 2020-04-09: qty 1

## 2020-04-09 MED ORDER — ONDANSETRON HCL 4 MG/2ML IJ SOLN
INTRAMUSCULAR | Status: DC | PRN
  Administered 2020-04-09: 4 mg via INTRAVENOUS

## 2020-04-09 MED ORDER — LIDOCAINE 2% (20 MG/ML) 5 ML SYRINGE
INTRAMUSCULAR | Status: AC
  Filled 2020-04-09: qty 5

## 2020-04-09 MED ORDER — SUGAMMADEX SODIUM 200 MG/2ML IV SOLN
INTRAVENOUS | Status: DC | PRN
  Administered 2020-04-09: 200 mg via INTRAVENOUS

## 2020-04-09 MED ORDER — ONDANSETRON HCL 4 MG/2ML IJ SOLN: 4.0000 mg | Freq: Once | INTRAMUSCULAR | Status: DC | PRN

## 2020-04-09 MED ORDER — ROCURONIUM BROMIDE 10 MG/ML (PF) SYRINGE
PREFILLED_SYRINGE | INTRAVENOUS | Status: DC | PRN
  Administered 2020-04-09: 40 mg via INTRAVENOUS

## 2020-04-09 MED ORDER — ACETAMINOPHEN 500 MG PO TABS: 1000.0000 mg | ORAL_TABLET | ORAL | Status: AC

## 2020-04-09 MED ORDER — ONDANSETRON HCL 4 MG/2ML IJ SOLN
INTRAMUSCULAR | Status: AC
  Filled 2020-04-09: qty 2

## 2020-04-09 MED ORDER — FENTANYL CITRATE (PF) 100 MCG/2ML IJ SOLN
INTRAMUSCULAR | Status: AC
  Filled 2020-04-09: qty 2

## 2020-04-09 MED ORDER — TRAMADOL HCL 50 MG PO TABS: 50.0000 mg | ORAL_TABLET | Freq: Four times a day (QID) | ORAL | 0 refills | Status: DC | PRN

## 2020-04-09 MED ORDER — LIDOCAINE 2% (20 MG/ML) 5 ML SYRINGE
INTRAMUSCULAR | Status: DC | PRN
  Administered 2020-04-09: 100 mg via INTRAVENOUS

## 2020-04-09 MED ORDER — MIDAZOLAM HCL 2 MG/2ML IJ SOLN
INTRAMUSCULAR | Status: AC
  Filled 2020-04-09: qty 2

## 2020-04-09 MED ORDER — PROPOFOL 10 MG/ML IV BOLUS
INTRAVENOUS | Status: DC | PRN
  Administered 2020-04-09: 30 mg via INTRAVENOUS
  Administered 2020-04-09: 150 mg via INTRAVENOUS

## 2020-04-09 MED ORDER — ACETAMINOPHEN 10 MG/ML IV SOLN
INTRAVENOUS | Status: AC
  Filled 2020-04-09: qty 100

## 2020-04-09 MED ORDER — PROPOFOL 10 MG/ML IV BOLUS
INTRAVENOUS | Status: AC
  Filled 2020-04-09: qty 40

## 2020-04-09 MED ORDER — CHLORHEXIDINE GLUCONATE CLOTH 2 % EX PADS: 6.0000 | MEDICATED_PAD | Freq: Once | CUTANEOUS | Status: DC

## 2020-04-09 MED ORDER — SUCCINYLCHOLINE CHLORIDE 200 MG/10ML IV SOSY
PREFILLED_SYRINGE | INTRAVENOUS | Status: DC | PRN
  Administered 2020-04-09: 160 mg via INTRAVENOUS

## 2020-04-09 MED ORDER — CEFAZOLIN SODIUM-DEXTROSE 2-4 GM/100ML-% IV SOLN
INTRAVENOUS | Status: AC
  Filled 2020-04-09: qty 100

## 2020-04-09 MED ORDER — FENTANYL CITRATE (PF) 100 MCG/2ML IJ SOLN
25.0000 ug | INTRAMUSCULAR | Status: DC | PRN
  Administered 2020-04-09 (×2): 50 ug via INTRAVENOUS

## 2020-04-09 MED ORDER — ROCURONIUM BROMIDE 10 MG/ML (PF) SYRINGE
PREFILLED_SYRINGE | INTRAVENOUS | Status: AC
  Filled 2020-04-09: qty 10

## 2020-04-09 MED ORDER — CEFAZOLIN SODIUM-DEXTROSE 2-4 GM/100ML-% IV SOLN
2.0000 g | INTRAVENOUS | Status: AC
  Administered 2020-04-09: 2 g via INTRAVENOUS

## 2020-04-09 MED ORDER — DEXAMETHASONE SODIUM PHOSPHATE 10 MG/ML IJ SOLN
INTRAMUSCULAR | Status: AC
  Filled 2020-04-09: qty 1

## 2020-04-09 MED ORDER — ACETAMINOPHEN 325 MG PO TABS: 325.0000 mg | ORAL_TABLET | ORAL | Status: DC | PRN

## 2020-04-09 MED ORDER — 0.9 % SODIUM CHLORIDE (POUR BTL) OPTIME
TOPICAL | Status: DC | PRN
  Administered 2020-04-09: 1000 mL

## 2020-04-09 MED ORDER — OXYCODONE HCL 5 MG/5ML PO SOLN: 5.0000 mg | Freq: Once | ORAL | Status: AC | PRN

## 2020-04-09 MED ORDER — BUPIVACAINE HCL (PF) 0.25 % IJ SOLN
INTRAMUSCULAR | Status: AC
  Filled 2020-04-09: qty 30

## 2020-04-09 MED ORDER — FENTANYL CITRATE (PF) 250 MCG/5ML IJ SOLN
INTRAMUSCULAR | Status: AC
  Filled 2020-04-09: qty 5

## 2020-04-09 MED ORDER — ACETAMINOPHEN 10 MG/ML IV SOLN
1000.0000 mg | Freq: Once | INTRAVENOUS | Status: AC
  Administered 2020-04-09: 1000 mg via INTRAVENOUS

## 2020-04-09 MED ORDER — SUCCINYLCHOLINE CHLORIDE 200 MG/10ML IV SOSY
PREFILLED_SYRINGE | INTRAVENOUS | Status: AC
  Filled 2020-04-09: qty 10

## 2020-04-09 MED ORDER — KETOROLAC TROMETHAMINE 30 MG/ML IJ SOLN
INTRAMUSCULAR | Status: AC
  Filled 2020-04-09: qty 1

## 2020-04-09 MED ORDER — OXYCODONE HCL 5 MG PO TABS
5.0000 mg | ORAL_TABLET | Freq: Once | ORAL | Status: AC | PRN
  Administered 2020-04-09: 5 mg via ORAL

## 2020-04-09 MED ORDER — ACETAMINOPHEN 160 MG/5ML PO SOLN: 325.0000 mg | ORAL | Status: DC | PRN

## 2020-04-09 MED ORDER — LACTATED RINGERS IV SOLN: INTRAVENOUS | Status: DC | PRN

## 2020-04-09 MED ORDER — ACETAMINOPHEN 500 MG PO TABS
ORAL_TABLET | ORAL | Status: AC
  Administered 2020-04-09: 1000 mg via ORAL
  Filled 2020-04-09: qty 2

## 2020-04-09 MED ORDER — DEXAMETHASONE SODIUM PHOSPHATE 10 MG/ML IJ SOLN
INTRAMUSCULAR | Status: DC | PRN
  Administered 2020-04-09: 8 mg via INTRAVENOUS

## 2020-04-09 SURGICAL SUPPLY — 45 items
CANISTER SUCT 3000ML PPV (MISCELLANEOUS) IMPLANT
COVER SURGICAL LIGHT HANDLE (MISCELLANEOUS) ×3 IMPLANT
COVER WAND RF STERILE (DRAPES) ×1 IMPLANT
DEFOGGER SCOPE WARMER CLEARIFY (MISCELLANEOUS) IMPLANT
DERMABOND ADVANCED (GAUZE/BANDAGES/DRESSINGS) ×2
DERMABOND ADVANCED .7 DNX12 (GAUZE/BANDAGES/DRESSINGS) ×1 IMPLANT
DISSECTOR BLUNT TIP ENDO 5MM (MISCELLANEOUS) IMPLANT
ELECT REM PT RETURN 9FT ADLT (ELECTROSURGICAL) ×3
ELECTRODE REM PT RTRN 9FT ADLT (ELECTROSURGICAL) ×1 IMPLANT
ENDOLOOP SUT PDS II  0 18 (SUTURE) ×2
ENDOLOOP SUT PDS II 0 18 (SUTURE) IMPLANT
GLOVE BIO SURGEON STRL SZ7.5 (GLOVE) ×1 IMPLANT
GOWN STRL REUS W/ TWL LRG LVL3 (GOWN DISPOSABLE) ×2 IMPLANT
GOWN STRL REUS W/ TWL XL LVL3 (GOWN DISPOSABLE) ×1 IMPLANT
GOWN STRL REUS W/TWL LRG LVL3 (GOWN DISPOSABLE) ×6
GOWN STRL REUS W/TWL XL LVL3 (GOWN DISPOSABLE) ×2
KIT BASIN OR (CUSTOM PROCEDURE TRAY) ×3 IMPLANT
KIT TURNOVER KIT B (KITS) ×3 IMPLANT
MESH 3DMAX 5X7 RT XLRG (Mesh General) ×2 IMPLANT
NDL INSUFFLATION 14GA 120MM (NEEDLE) IMPLANT
NEEDLE INSUFFLATION 14GA 120MM (NEEDLE) IMPLANT
NS IRRIG 1000ML POUR BTL (IV SOLUTION) ×3 IMPLANT
PAD ARMBOARD 7.5X6 YLW CONV (MISCELLANEOUS) ×6 IMPLANT
RELOAD STAPLE 4.0 BLU F/HERNIA (INSTRUMENTS) ×1 IMPLANT
RELOAD STAPLE 4.8 BLK F/HERNIA (STAPLE) IMPLANT
RELOAD STAPLE HERNIA 4.0 BLUE (INSTRUMENTS) ×3 IMPLANT
RELOAD STAPLE HERNIA 4.8 BLK (STAPLE) IMPLANT
SCISSORS LAP 5X35 DISP (ENDOMECHANICALS) ×3 IMPLANT
SET IRRIG TUBING LAPAROSCOPIC (IRRIGATION / IRRIGATOR) IMPLANT
SET TUBE SMOKE EVAC HIGH FLOW (TUBING) ×3 IMPLANT
STAPLER HERNIA 12 8.5 360D (INSTRUMENTS) ×3 IMPLANT
SUT MNCRL AB 4-0 PS2 18 (SUTURE) ×3 IMPLANT
SUT VIC AB 1 CT1 27 (SUTURE)
SUT VIC AB 1 CT1 27XBRD ANBCTR (SUTURE) IMPLANT
SYRINGE TOOMEY DISP (SYRINGE) ×3 IMPLANT
TOWEL GREEN STERILE (TOWEL DISPOSABLE) ×3 IMPLANT
TOWEL GREEN STERILE FF (TOWEL DISPOSABLE) ×3 IMPLANT
TRAY FOL W/BAG SLVR 16FR STRL (SET/KITS/TRAYS/PACK) IMPLANT
TRAY FOLEY W/BAG SLVR 14FR (SET/KITS/TRAYS/PACK) ×1 IMPLANT
TRAY FOLEY W/BAG SLVR 16FR LF (SET/KITS/TRAYS/PACK) ×2
TRAY LAPAROSCOPIC MC (CUSTOM PROCEDURE TRAY) ×3 IMPLANT
TROCAR OPTICAL SHORT 5MM (TROCAR) ×3 IMPLANT
TROCAR OPTICAL SLV SHORT 5MM (TROCAR) ×3 IMPLANT
TROCAR XCEL 12X100 BLDLESS (ENDOMECHANICALS) ×3 IMPLANT
WATER STERILE IRR 1000ML POUR (IV SOLUTION) ×3 IMPLANT

## 2020-04-09 NOTE — Op Note (Signed)
04/09/2020  8:30 AM  PATIENT:  [name]  74 y.o. male  PRE-OPERATIVE DIAGNOSIS:  RIGHT INGUINAL HERNIA  POST-OPERATIVE DIAGNOSIS:  RIGHT PANTALOON INGUINAL HERNIA  PROCEDURE:  Procedure(s): LAPAROSCOPIC RIGHT INGUINAL HERNIA REPAIR WITH MESH (Right)  SURGEON:  Surgeon(s) and Role:    * Ralene Ok, MD - Primary  ASSISTANTS: Pryor Curia, RNFA   ANESTHESIA:   local and general  EBL:  minimal   BLOOD ADMINISTERED:none  DRAINS: none   LOCAL MEDICATIONS USED:  BUPIVICAINE   SPECIMEN:  No Specimen  DISPOSITION OF SPECIMEN:  N/A  COUNTS:  YES  TOURNIQUET:  * No tourniquets in log *  DICTATION: .Dragon Dictation   Counts: reported as correct x 2  Findings:  The patient had a small right pantaloon  hernia  Indications for procedure:  The patient is a 74 year old male with a right hernia for several months. Patient complained of symptomatology to his right inguinal area. The patient was taken back for elective inguinal hernia repair.  Details of the procedure: The patient was taken back to the operating room. The patient was placed in supine position with bilateral SCDs in place.  The patient was prepped and draped in the usual sterile fashion.  After appropriate anitbiotics were confirmed, a time-out was confirmed and all facts were verified.  0.25% Marcaine was used to infiltrate the umbilical area. A 11-blade was used to cut down the skin and blunt dissection was used to get the anterior fashion.  The anterior fascia was incised approximately 1 cm and the muscles were retracted laterally. Blunt dissection was then used to create a space in the preperitoneal area. At this time a 10 mm camera was then introduced into the space and advanced the pubic tubercle and a 12 mm trocar was placed over this and insufflation was started.  At this time and space was created from medial to laterally the preperitoneal space.  Cooper's ligament was initially cleaned off. A  direct hernia was seen and reduced and the transversalis fascia retracted spontaneously. A hernia sac was identified in the indirect space. Dissection of the hernia sac was undertaken the vas deferens was identified and protected in all parts of the case.    Once the hernia sac was taken down to approximately the umbilicus a Bard 3D Max mesh, size: Rachelle Hora, was  introduced into the preperitoneal space.  The mesh was brought over to cover the direct and indirect hernia spaces.  This was anchored into place and secured to Cooper's ligament with 4.109mm staples from a Coviden hernia stapler. It was anchored to the anterior abdominal wall with 4.8 mm staples. The hernia sac was seen lying posterior to the mesh. There was no staples placed laterally. The insufflation was evacuated and the peritoneum was seen posterior to the mesh. The trochars were removed. The anterior fascia was reapproximated using #1 Vicryl on a UR- 6.  Intra-abdominal air was evacuated and the Veress needle removed. The skin was reapproximated using 4-0 Monocryl subcuticular fashion and dressed with Dermbond.  The patient was awakened from general anesthesia and taken to recovery in stable condition.   PLAN OF CARE: Discharge to home after PACU  PATIENT DISPOSITION:  PACU - hemodynamically stable.   Delay start of Pharmacological VTE agent (>24hrs) due to surgical blood loss or risk of bleeding: not applicable

## 2020-04-09 NOTE — Discharge Instructions (Signed)
[facility] _______[facility] °INGUINAL HERNIA REPAIR: POST OP INSTRUCTIONS ° °Always review your discharge instruction sheet given to you by the facility where your surgery was performed. °IF YOU HAVE DISABILITY OR FAMILY LEAVE FORMS, YOU MUST BRING THEM TO THE OFFICE FOR PROCESSING.   °DO NOT GIVE THEM TO YOUR DOCTOR. ° °1. A  prescription for pain medication may be given to you upon discharge.  Take your pain medication as prescribed, if needed.  If narcotic pain medicine is not needed, then you may take acetaminophen (Tylenol) or ibuprofen (Advil) as needed. °2. Take your usually prescribed medications unless otherwise directed. °If you need a refill on your pain medication, please contact your pharmacy.  They will contact our office to request authorization. Prescriptions will not be filled after 5 pm or on week-ends. °3. You should follow a light diet the first 24 hours after arrival home, such as soup and crackers, etc.  Be sure to include lots of fluids daily.  Resume your normal diet the day after surgery. °4.Most patients will experience some swelling and bruising around the umbilicus or in the groin and scrotum.  Ice packs and reclining will help.  Swelling and bruising can take several days to resolve.  °6. It is common to experience some constipation if taking pain medication after surgery.  Increasing fluid intake and taking a stool softener (such as Colace) will usually help or prevent this problem from occurring.  A mild laxative (Milk of Magnesia or Miralax) should be taken according to package directions if there are no bowel movements after 48 hours. °7. Unless discharge instructions indicate otherwise, you may remove your bandages 24-48 hours after surgery, and you may shower at that time.  You may have steri-strips (small skin tapes) in place directly over the incision.  These strips should be left on the skin for 7-10 days.  If your surgeon used skin glue on the incision, you may  shower in 24 hours.  The glue will flake off over the next 2-3 weeks.  Any sutures or staples will be removed at the office during your follow-up visit. °8. ACTIVITIES:  You may resume regular (light) daily activities beginning the next day--such as daily self-care, walking, climbing stairs--gradually increasing activities as tolerated.  You may have sexual intercourse when it is comfortable.  Refrain from any heavy lifting or straining until approved by your doctor. ° °a.You may drive when you are no longer taking prescription pain medication, you can comfortably wear a seatbelt, and you can safely maneuver your car and apply brakes. °b.RETURN TO WORK:   °_____________________________________________ ° °9.You should see your doctor in the office for a follow-up appointment approximately 2-3 weeks after your surgery.  Make sure that you call for this appointment within a day or two after you arrive home to insure a convenient appointment time. °10.OTHER INSTRUCTIONS: _________________________ °   _____________________________________ ° °WHEN TO CALL YOUR DOCTOR: °1. Fever over 101.0 °2. Inability to urinate °3. Nausea and/or vomiting °4. Extreme swelling or bruising °5. Continued bleeding from incision. °6. Increased pain, redness, or drainage from the incision ° °The clinic staff is available to answer your questions during regular business hours.  Please don’t hesitate to call and ask to speak to one of the nurses for clinical concerns.  If you have a medical emergency, go to the nearest emergency room or call 911.  A surgeon from [facility] is always on call at the hospital ° ° °[address], [address], [place], [place]  [address] ? ° [address] [address], [place], [place]   [address] °[phone] ? [phone] ? FAX [phone] °Web site: [email or web address] ° °

## 2020-04-09 NOTE — Anesthesia Postprocedure Evaluation (Signed)
Anesthesia Post Note  Patient: [name]  Procedure(s) Performed: LAPAROSCOPIC RIGHT INGUINAL HERNIA REPAIR WITH MESH (Right Inguinal)     Patient location during evaluation: PACU Anesthesia Type: General Level of consciousness: awake and alert Pain management: pain level controlled Vital Signs Assessment: post-procedure vital signs reviewed and stable Respiratory status: spontaneous breathing, nonlabored ventilation, respiratory function stable and patient connected to nasal cannula oxygen Cardiovascular status: blood pressure returned to baseline and stable Postop Assessment: no apparent nausea or vomiting Anesthetic complications: no    Last Vitals:  Vitals:   04/09/20 0930 04/09/20 0942  BP: 139/77 121/71  Pulse: 63 (!) 57  Resp: 16 11  Temp: 36.5 C   SpO2: 90% 94%    Last Pain:  Vitals:   04/09/20 0607  TempSrc: Oral  PainSc: 3                  Jamonte Curfman

## 2020-04-09 NOTE — Transfer of Care (Signed)
Immediate Anesthesia Transfer of Care Note  Patient: [name]  Procedure(s) Performed: LAPAROSCOPIC RIGHT INGUINAL HERNIA REPAIR WITH MESH (Right Inguinal)  Patient Location: PACU  Anesthesia Type:General  Level of Consciousness: awake, patient cooperative and responds to stimulation  Airway & Oxygen Therapy: Patient Spontanous Breathing and Patient connected to face mask oxygen  Post-op Assessment: Report given to RN, Post -op Vital signs reviewed and stable and Patient moving all extremities X 4  Post vital signs: Reviewed and stable  Last Vitals:  Vitals Value Taken Time  BP 161/97 04/09/20 0849  Temp    Pulse 77 04/09/20 0851  Resp 17 04/09/20 0851  SpO2 93 % 04/09/20 0851  Vitals shown include unvalidated device data.  Last Pain:  Vitals:   04/09/20 0607  TempSrc: Oral  PainSc: 3       Patients Stated Pain Goal: 3 ([id number])  Complications: No apparent anesthesia complications

## 2020-04-09 NOTE — Interval H&P Note (Signed)
History and Physical Interval Note:  04/09/2020 6:53 AM  [name]  has presented today for surgery, with the diagnosis of RIGHT INGUINAL HERNIA.  The various methods of treatment have been discussed with the patient and family. After consideration of risks, benefits and other options for treatment, the patient has consented to  Procedure(s): LAPAROSCOPIC RIGHT INGUINAL HERNIA REPAIR WITH MESH (Right) as a surgical intervention.  The patient's history has been reviewed, patient examined, no change in status, stable for surgery.  I have reviewed the patient's chart and labs.  Questions were answered to the patient's satisfaction.     Ralene Ok

## 2020-04-10 ENCOUNTER — Encounter: Payer: Self-pay | Admitting: *Deleted

## 2020-04-13 ENCOUNTER — Ambulatory Visit (INDEPENDENT_AMBULATORY_CARE_PROVIDER_SITE_OTHER): Payer: Medicare Other | Admitting: Pharmacist

## 2020-04-13 ENCOUNTER — Other Ambulatory Visit: Payer: Self-pay

## 2020-04-13 ENCOUNTER — Telehealth: Payer: Self-pay | Admitting: *Deleted

## 2020-04-13 DIAGNOSIS — Z7901 Long term (current) use of anticoagulants: Secondary | ICD-10-CM

## 2020-04-13 DIAGNOSIS — Z5181 Encounter for therapeutic drug level monitoring: Secondary | ICD-10-CM | POA: Diagnosis not present

## 2020-04-13 DIAGNOSIS — D6859 Other primary thrombophilia: Secondary | ICD-10-CM

## 2020-04-13 LAB — POCT INR: INR: 1.3 — AB (ref 2.0–3.0)

## 2020-04-13 NOTE — Patient Instructions (Signed)
Description   Continue taking 1/2 tablet daily except 1 tablet on Tuesdays and Saturdays. Follow bridge instructions. Recheck INR 1 week post procedure.  Call us with any new medications or concerns [phone] [facility], Main # [phone].

## 2020-04-13 NOTE — Telephone Encounter (Signed)
Pt's wife called and stated that pt had procedure on Monday and is now taking Lovenox and Warfarin. Pt stated that pt has been having nose bleeds that has now stopped. Informed wife that I could schedule an appointment for pt to get his INR checked today but if pt develops a nose bleed that will not stop then pt should seek medical attention.  Appointment scheduled for pt get INR checked later on this afternoon.

## 2020-04-16 ENCOUNTER — Ambulatory Visit (INDEPENDENT_AMBULATORY_CARE_PROVIDER_SITE_OTHER): Payer: Medicare Other | Admitting: *Deleted

## 2020-04-16 ENCOUNTER — Other Ambulatory Visit: Payer: Self-pay

## 2020-04-16 DIAGNOSIS — D6859 Other primary thrombophilia: Secondary | ICD-10-CM

## 2020-04-16 DIAGNOSIS — Z5181 Encounter for therapeutic drug level monitoring: Secondary | ICD-10-CM | POA: Diagnosis not present

## 2020-04-16 LAB — POCT INR: INR: 1.5 — AB (ref 2.0–3.0)

## 2020-04-16 MED ORDER — ENOXAPARIN SODIUM 150 MG/ML ~~LOC~~ SOLN: 150.0000 mg | SUBCUTANEOUS | 0 refills | Status: DC

## 2020-04-16 NOTE — Patient Instructions (Signed)
Description   Take 1.5 tablets today, then continue taking 1/2 tablet daily except 1 tablet on Tuesdays and Saturdays. Continue Lovenox injections. Recheck INR on 04/19/20.  Call us with any new medications or concerns [phone] [facility], Main # [phone].

## 2020-04-17 ENCOUNTER — Other Ambulatory Visit: Payer: Self-pay | Admitting: Interventional Cardiology

## 2020-04-19 ENCOUNTER — Other Ambulatory Visit: Payer: Self-pay

## 2020-04-19 ENCOUNTER — Ambulatory Visit (INDEPENDENT_AMBULATORY_CARE_PROVIDER_SITE_OTHER): Payer: Medicare Other | Admitting: *Deleted

## 2020-04-19 DIAGNOSIS — Z5181 Encounter for therapeutic drug level monitoring: Secondary | ICD-10-CM

## 2020-04-19 DIAGNOSIS — D6859 Other primary thrombophilia: Secondary | ICD-10-CM | POA: Diagnosis not present

## 2020-04-19 LAB — POCT INR: INR: 1.9 — AB (ref 2.0–3.0)

## 2020-04-19 NOTE — Patient Instructions (Signed)
Description   Take 1 tablet today, then continue taking 1/2 tablet daily except 1 tablet on Tuesdays and Saturdays. Recheck INR in 1 week. Call us with any new medications or concerns [phone] [facility], Main # [phone].

## 2020-04-26 ENCOUNTER — Ambulatory Visit (INDEPENDENT_AMBULATORY_CARE_PROVIDER_SITE_OTHER): Payer: Medicare Other | Admitting: *Deleted

## 2020-04-26 ENCOUNTER — Other Ambulatory Visit: Payer: Self-pay

## 2020-04-26 DIAGNOSIS — D6859 Other primary thrombophilia: Secondary | ICD-10-CM | POA: Diagnosis not present

## 2020-04-26 DIAGNOSIS — Z5181 Encounter for therapeutic drug level monitoring: Secondary | ICD-10-CM | POA: Diagnosis not present

## 2020-04-26 LAB — POCT INR: INR: 3.4 — AB (ref 2.0–3.0)

## 2020-04-26 NOTE — Patient Instructions (Signed)
Description   Hold warfarin today, and then continue taking 1/2 tablet daily except 1 tablet on Tuesdays and Saturdays. Recheck INR in 2 weeks. Call us with any new medications or concerns [phone] [facility], Main # [phone].

## 2020-05-11 ENCOUNTER — Ambulatory Visit (INDEPENDENT_AMBULATORY_CARE_PROVIDER_SITE_OTHER): Payer: Medicare Other

## 2020-05-11 ENCOUNTER — Other Ambulatory Visit: Payer: Self-pay

## 2020-05-11 DIAGNOSIS — Z5181 Encounter for therapeutic drug level monitoring: Secondary | ICD-10-CM | POA: Diagnosis not present

## 2020-05-11 DIAGNOSIS — D6859 Other primary thrombophilia: Secondary | ICD-10-CM | POA: Diagnosis not present

## 2020-05-11 LAB — POCT INR: INR: 3.6 — AB (ref 2.0–3.0)

## 2020-05-11 NOTE — Patient Instructions (Signed)
Description   Hold warfarin today, then start taking 1/2 tablet daily except 1 tablet on Saturdays. Recheck INR in 2 weeks. Call us with any new medications or concerns [phone] [facility], Main # [phone].

## 2020-05-25 ENCOUNTER — Ambulatory Visit (INDEPENDENT_AMBULATORY_CARE_PROVIDER_SITE_OTHER): Payer: Medicare Other | Admitting: *Deleted

## 2020-05-25 ENCOUNTER — Other Ambulatory Visit: Payer: Self-pay

## 2020-05-25 DIAGNOSIS — D6859 Other primary thrombophilia: Secondary | ICD-10-CM | POA: Diagnosis not present

## 2020-05-25 DIAGNOSIS — Z5181 Encounter for therapeutic drug level monitoring: Secondary | ICD-10-CM | POA: Diagnosis not present

## 2020-05-25 LAB — POCT INR: INR: 2.2 (ref 2.0–3.0)

## 2020-05-25 NOTE — Patient Instructions (Signed)
Description   Continue taking 1/2 tablet daily except 1 tablet on Saturdays. Recheck INR in 3 weeks. Call us with any new medications or concerns [phone] [facility], Main # [phone].

## 2020-06-15 ENCOUNTER — Other Ambulatory Visit: Payer: Self-pay

## 2020-06-15 ENCOUNTER — Ambulatory Visit (INDEPENDENT_AMBULATORY_CARE_PROVIDER_SITE_OTHER): Payer: Medicare Other

## 2020-06-15 DIAGNOSIS — D6859 Other primary thrombophilia: Secondary | ICD-10-CM

## 2020-06-15 DIAGNOSIS — Z5181 Encounter for therapeutic drug level monitoring: Secondary | ICD-10-CM

## 2020-06-15 LAB — POCT INR: INR: 1.8 — AB (ref 2.0–3.0)

## 2020-06-15 NOTE — Patient Instructions (Signed)
Take 1 tablet today and then continue taking 1/2 tablet daily except 1 tablet on Saturdays. Recheck INR in 3 weeks. Call us with any new medications or concerns [phone] [facility], Main # [phone].

## 2020-06-18 ENCOUNTER — Telehealth: Payer: Self-pay | Admitting: Interventional Cardiology

## 2020-06-18 MED ORDER — FUROSEMIDE 20 MG PO TABS
ORAL_TABLET | ORAL | 0 refills | Status: DC
Start: 2020-06-18 — End: 2020-07-20

## 2020-06-18 NOTE — Telephone Encounter (Signed)
New message   Pt c/o swelling: STAT is pt has developed SOB within 24 hours  1) How much weight have you gained and in what time span? No   2) If swelling, where is the swelling located? Both feet and ankles   3) Are you currently taking a fluid pill? No   4) Are you currently SOB? No   5) Do you have a log of your daily weights (if so, list)? No   6) Have you gained 3 pounds in a day or 5 pounds in a week? No   7) Have you traveled recently? No

## 2020-06-18 NOTE — Telephone Encounter (Signed)
Spoke with Pt.  Pt has a history of edema, but per wife it is worse now.  Pt has taken furosemide previously, but states he didn't like it "because he was in the bathroom all day".  Explained how furosemide works to family.  Spoke with DOD.  Will plan for Pt to try furosemide 20 mg one tablet by mouth daily for 3 days.  Advised if edema better after that, discontinue.  If he continues to have edema advised to call office to discuss further with Dr. Irish Lack.  Family indicates understanding.

## 2020-07-06 ENCOUNTER — Ambulatory Visit (INDEPENDENT_AMBULATORY_CARE_PROVIDER_SITE_OTHER): Payer: Medicare Other | Admitting: *Deleted

## 2020-07-06 ENCOUNTER — Other Ambulatory Visit: Payer: Self-pay

## 2020-07-06 DIAGNOSIS — D6859 Other primary thrombophilia: Secondary | ICD-10-CM | POA: Diagnosis not present

## 2020-07-06 DIAGNOSIS — Z5181 Encounter for therapeutic drug level monitoring: Secondary | ICD-10-CM | POA: Diagnosis not present

## 2020-07-06 LAB — POCT INR: INR: 2.1 (ref 2.0–3.0)

## 2020-07-06 NOTE — Patient Instructions (Signed)
Description   Continue taking 1/2 tablet daily except 1 tablet on Saturdays. Recheck INR in 4 weeks. Call us with any new medications or concerns [phone] [facility], Main # [phone].

## 2020-07-19 ENCOUNTER — Telehealth: Payer: Self-pay | Admitting: Interventional Cardiology

## 2020-07-19 NOTE — Telephone Encounter (Signed)
Spoke with pt who reports he has right lower extremity edema up to mid calf.  Pt denies CP, SOB,  pain or redness of extremity and states edema resolves overnight after sleeping.  Pt reports he is on a low Na+ and drinks 32oz of water daily.  Pt had an episode of swelling in July and was given 3 days of Furosemide, swelling resolved but has since returned.  Pt has not weighed in about 10 days.  Will forward information to Dr Irish Lack for review and recommendation.  Pt advised to continue on low Na+ diet, resume daily weights and elevate feet and legs while sitting during the day.  ED precautions reviewed/  Pt verbalizes understanding and agrees with current plan.

## 2020-07-19 NOTE — Telephone Encounter (Signed)
Pt c/o swelling: STAT is pt has developed SOB within 24 hours  1) How much weight have you gained and in what time span?  2) If swelling, where is the swelling located? Legs and feet are swelling   3) Are you currently taking a fluid pill? no  4) Are you currently SOB? no  5) Do you have a log of your daily weights (if so, list)? no  6) Have you gained 3 pounds in a day or 5 pounds in a week? No. Wife said its and on and off fluctuation of 2-3 pounds even after elevating feet   7) Have you traveled recently? No   Patient had issues with swelling about a month back and went on a fluid pill for 3 days. The fluid pill seemed to help but now the swelling is coming back. The wife would like to get the patient on a consistent regimen of fluid pills to help reduce swelling

## 2020-07-20 MED ORDER — FUROSEMIDE 20 MG PO TABS: ORAL_TABLET | ORAL | 0 refills | Status: DC

## 2020-07-20 NOTE — Telephone Encounter (Signed)
Called and spoke to patient. He states that the swelling is on the same side that he had veins removed for prior CABG. Instructed patient to take lasix 20 mg QD x 3 days. Rx sent to preferred pharmacy.

## 2020-07-20 NOTE — Telephone Encounter (Signed)
May be related to vein removal from prior CABG. Is it on the same side?  OK to repeat the 3 day course of Lasix that he had a few weeks ago.    JV

## 2020-08-02 DIAGNOSIS — M7989 Other specified soft tissue disorders: Secondary | ICD-10-CM

## 2020-08-03 ENCOUNTER — Other Ambulatory Visit: Payer: Self-pay

## 2020-08-03 ENCOUNTER — Ambulatory Visit (INDEPENDENT_AMBULATORY_CARE_PROVIDER_SITE_OTHER): Payer: Medicare Other | Admitting: *Deleted

## 2020-08-03 DIAGNOSIS — D6859 Other primary thrombophilia: Secondary | ICD-10-CM

## 2020-08-03 DIAGNOSIS — Z5181 Encounter for therapeutic drug level monitoring: Secondary | ICD-10-CM | POA: Diagnosis not present

## 2020-08-03 LAB — POCT INR: INR: 2.8 (ref 2.0–3.0)

## 2020-08-03 MED ORDER — FUROSEMIDE 20 MG PO TABS
20.0000 mg | ORAL_TABLET | Freq: Every day | ORAL | 3 refills | Status: DC
Start: 2020-08-03 — End: 2020-12-31

## 2020-08-03 NOTE — Patient Instructions (Signed)
Description   Continue taking 1/2 tablet daily except 1 tablet on Saturdays. Recheck INR in 5 weeks. Call us with any new medications or concerns [phone] [facility], Main # [phone].

## 2020-08-03 NOTE — Telephone Encounter (Signed)
Called and spoke to patient and made him aware of Dr. Hassell Done recommendations to start lasix 20 mg QD. Made patient aware that he may take an extra 20 mg QD of lasix AS NEEDED. Instructed for the patient to eat potassium rich foods. Patient wishes to have Rx sent to [facility] and states that he will call back to schedule BMET once he receives the Rx.

## 2020-08-27 ENCOUNTER — Encounter: Payer: Self-pay | Admitting: Family Medicine

## 2020-08-27 ENCOUNTER — Telehealth: Payer: Self-pay | Admitting: Family Medicine

## 2020-08-27 NOTE — Telephone Encounter (Signed)
Pts spouse is calling in stating that the pt needs a refill on tramadol 50 MG  Pharm:  [facility] on [address]  This medication was not prescribed by Dr. Martinique pt would like to see if it can be sent in.

## 2020-08-27 NOTE — Telephone Encounter (Signed)
Appt scheduled for tomorrow at 4:30.

## 2020-08-27 NOTE — Telephone Encounter (Signed)
I sent a message back to pt's wife to see if we can set up a virtual visit since you haven't prescribed this.

## 2020-08-27 NOTE — Telephone Encounter (Signed)
We are leaving for cruise this Friday and Ken's arthritis pain has increased.  Would you please order a prn pain med for him?  He has found that Tramadol 50 mg PO q6h has given him relief in the past.  We will be gone for one week and I so want him to enjoy our trip.   Thank you, Greg Cratty

## 2020-08-28 ENCOUNTER — Encounter: Payer: Self-pay | Admitting: Family Medicine

## 2020-08-28 ENCOUNTER — Telehealth (INDEPENDENT_AMBULATORY_CARE_PROVIDER_SITE_OTHER): Payer: Medicare Other | Admitting: Family Medicine

## 2020-08-28 VITALS — BP 147/85 | HR 63 | Ht 74.0 in

## 2020-08-28 DIAGNOSIS — M159 Polyosteoarthritis, unspecified: Secondary | ICD-10-CM

## 2020-08-28 DIAGNOSIS — I25119 Atherosclerotic heart disease of native coronary artery with unspecified angina pectoris: Secondary | ICD-10-CM

## 2020-08-28 DIAGNOSIS — I10 Essential (primary) hypertension: Secondary | ICD-10-CM

## 2020-08-28 DIAGNOSIS — F40243 Fear of flying: Secondary | ICD-10-CM

## 2020-08-28 MED ORDER — TRAMADOL HCL 50 MG PO TABS: 50.0000 mg | ORAL_TABLET | Freq: Two times a day (BID) | ORAL | 0 refills | Status: DC | PRN

## 2020-08-28 MED ORDER — HYDROXYZINE HCL 25 MG PO TABS: 25.0000 mg | ORAL_TABLET | Freq: Every day | ORAL | 0 refills | Status: DC | PRN

## 2020-08-28 NOTE — Progress Notes (Signed)
Virtual Visit via Video Note I connected with [name] on 08/28/20 by a video enabled telemedicine application and verified that I am speaking with the correct person using two identifiers.  Location patient: home Location provider:work office Persons participating in the virtual visit: patient,wife,and provider  I discussed the limitations of evaluation and management by telemedicine and the availability of in person appointments. The patient expressed understanding and agreed to proceed.   HPI: [name] is a 74 yo male who is being seen for chronic disease management. He was last seen on 02/21/2020.  Since his last visit he had inguinal hernia repair,04/09/20. Also following with Coumadin clinic.  BP has been mildly elevated: 145/72, 140/73. Some DBP's 150's.-160's. Negative for severe/frequent headache, visual changes, chest pain, dyspnea, focal weakness, or edema.  Lab Results  Component Value Date   CREATININE 0.97 04/04/2020   BUN 15 04/04/2020   NA 142 04/04/2020   K 4.2 04/04/2020   CL 108 04/04/2020   CO2 27 04/04/2020   Requesting a prescription for tramadol, which he took after hernia surgery and helped with joint pain as well. Generalized arthritis involving shoulders, wrist, IP joints, lower/upper back, hips, and knees. Tylenol does not seem to help. Pain is exacerbated by movement after prolonged rest.  Planning on going on vacation in a few days, he is going to be flying, which causes anxiety.  He wonders if he can have something to help him relax during the flight. He does not get dizzy or nauseated.  ROS: See pertinent positives and negatives per HPI.  Past Medical History:  Diagnosis Date   Allergy    Arthritis    CAD (coronary artery disease) 1996   POST CABG   Cataracts, bilateral    Chicken pox    Dyslipidemia    Hyperlipidemia    Hypertension    Sinus bradycardia     Past Surgical History:  Procedure Laterality Date   BYPASS GRAFT   1996   CATARACT EXTRACTION Right    INGUINAL HERNIA REPAIR Right 04/09/2020   Procedure: LAPAROSCOPIC RIGHT INGUINAL HERNIA REPAIR WITH MESH;  Surgeon: Ralene Ok, MD;  Location: [facility] OR;  Service: General;  Laterality: Right;   REFRACTIVE SURGERY Right    SKIN BIOPSY     head x 2   UMBILICAL HERNIA REPAIR  2003    Family History  Problem Relation Age of Onset   Alzheimer's disease Mother    Cancer Father        prostate   Prostate cancer Father    AAA (abdominal aortic aneurysm) Sister    Hypertension Maternal Grandmother    Hypertension Paternal Grandmother    Colon cancer Neg Hx    Esophageal cancer Neg Hx    Pancreatic cancer Neg Hx    Liver disease Neg Hx     Social History   Socioeconomic History   Marital status: Married    Spouse [name]   Number of children: 2   Years of education: COLLEGE   Highest education level: Not on file  Occupational History   Occupation: RETIRED  Tobacco Use   Smoking status: Former Smoker    Quit date: 05/29/1982    Years since quitting: 38.2   Smokeless tobacco: Never Used  Substance and Sexual Activity   Alcohol use: Yes    Comment: 0-2 drinks a night   Drug use: No   Sexual activity: Not on file  Other Topics Concern   Not on file  Social History  Narrative   Not on file   Social Determinants of Health   Financial Resource Strain:    Difficulty of Paying Living Expenses: Not on file  Food Insecurity:    Worried About [place] in the Last Year: Not on file   Ran Out of Food in the Last Year: Not on file  Transportation Needs:    Lack of Transportation (Medical): Not on file   Lack of Transportation (Non-Medical): Not on file  Physical Activity:    Days of Exercise per Week: Not on file   Minutes of Exercise per Session: Not on file  Stress:    Feeling of Stress : Not on file  Social Connections:    Frequency of Communication with Friends and Family: Not on file    Frequency of Social Gatherings with Friends and Family: Not on file   Attends Religious Services: Not on file   Active Member of [place] or Organizations: Not on file   Attends Archivist Meetings: Not on file   Marital Status: Not on file  Intimate Partner Violence:    Fear of Current or Ex-Partner: Not on file   Emotionally Abused: Not on file   Physically Abused: Not on file   Sexually Abused: Not on file    Current Outpatient Medications:    acetaminophen (TYLENOL) 500 MG tablet, Take 1,000 mg by mouth every 6 (six) hours as needed for moderate pain or headache. , Disp: , Rfl:    amLODipine (NORVASC) 5 MG tablet, TAKE 1 TABLET DAILY (Patient taking differently: Take 5 mg by mouth daily. ), Disp: 90 tablet, Rfl: 3   cholecalciferol (VITAMIN D) 1000 units tablet, Take 1,000 Units by mouth daily. , Disp: , Rfl:    Cyanocobalamin (VITAMIN B12) 500 MCG TABS, Take 500 mcg by mouth daily. , Disp: , Rfl:    ezetimibe (ZETIA) 10 MG tablet, TAKE 1 TABLET AT BEDTIME (Patient taking differently: Take 10 mg by mouth at bedtime. ), Disp: 90 tablet, Rfl: 3   fenofibrate (TRICOR) 145 MG tablet, TAKE 1 TABLET AT BEDTIME (Patient taking differently: Take 145 mg by mouth every evening. ), Disp: 90 tablet, Rfl: 3   furosemide (LASIX) 20 MG tablet, Take 1 tablet (20 mg total) by mouth daily., Disp: 90 tablet, Rfl: 3   losartan (COZAAR) 100 MG tablet, TAKE 1 TABLET DAILY (Patient taking differently: Take 100 mg by mouth every evening. ), Disp: 90 tablet, Rfl: 3   Olopatadine HCl (PATADAY OP), Place 1 drop into both eyes daily as needed (allergies)., Disp: , Rfl:    rosuvastatin (CRESTOR) 40 MG tablet, TAKE 1 TABLET AT BEDTIME (PLEASE KEEP UPCOMING APPOINTMENT FOR FUTURE REFILLS) (Patient taking differently: Take 40 mg by mouth every evening. ), Disp: 90 tablet, Rfl: 3   warfarin (COUMADIN) 5 MG tablet, TAKE AS DIRECTED BY COUMADIN CLINIC, Disp: 80 tablet, Rfl: 1   hydrOXYzine  (ATARAX/VISTARIL) 25 MG tablet, Take 1 tablet (25 mg total) by mouth daily as needed for anxiety (daily as needed before flying.)., Disp: 30 tablet, Rfl: 0   nitroGLYCERIN (NITROSTAT) 0.4 MG SL tablet, Place 1 tablet (0.4 mg total) every 5 (five) minutes as needed under the tongue for chest pain., Disp: 25 tablet, Rfl: 3   traMADol (ULTRAM) 50 MG tablet, Take 1 tablet (50 mg total) by mouth every 12 (twelve) hours as needed., Disp: 30 tablet, Rfl: 0  EXAM:  VITALS per patient if applicable:BP (!) 073/71    Pulse 63  Ht 6\' 2"  (1.88 m)    BMI 25.94 kg/m   GENERAL: alert, oriented, appears well and in no acute distress  HEENT: atraumatic, conjunctiva clear, no obvious abnormalities on inspection.  LUNGS: on inspection no signs of respiratory distress, breathing rate appears normal, no obvious gross SOB, gasping or wheezing  CV: no obvious cyanosis.  PSYCH/NEURO: pleasant and cooperative, no obvious depression or anxiety, speech and thought processing grossly intact  ASSESSMENT AND PLAN:  Discussed the following assessment and plan:  Generalized osteoarthrosis, involving multiple sites - Plan: traMADol (ULTRAM) 50 MG tablet Problem seems to be getting worse. For now I recommend tramadol 50 mg twice daily. We discussed current guidelines in regard to pain management with opioid/controlled medication. Side effects discussed, including the risk for falls.  Anxiety with flying - Plan: hydrOXYzine (ATARAX/VISTARIL) 25 MG tablet Recommend hydroxyzine 12.5-25 mg 15 min before flying. We discussed side effects. He is going to be with his wife and transported by wheelchair.  Essential hypertension BP is not adequately controlled. He agrees with increasing dose of amlodipine from 5 mg to 10 mg. Continue low-salt diet. Continue monitoring BP regularly. Follow-up in 2 months.   I discussed the assessment and treatment plan with the patient. [name] was provided an opportunity to ask  questions and all were answered. He and his wife agreed with the plan and demonstrated an understanding of the instructions.  Return in about 2 months (around 10/28/2020) for HTN,pain.  Marquitta Persichetti [place], MD

## 2020-09-10 ENCOUNTER — Other Ambulatory Visit: Payer: Self-pay

## 2020-09-10 ENCOUNTER — Ambulatory Visit (INDEPENDENT_AMBULATORY_CARE_PROVIDER_SITE_OTHER): Payer: Medicare Other | Admitting: *Deleted

## 2020-09-10 DIAGNOSIS — D6859 Other primary thrombophilia: Secondary | ICD-10-CM | POA: Diagnosis not present

## 2020-09-10 DIAGNOSIS — Z5181 Encounter for therapeutic drug level monitoring: Secondary | ICD-10-CM | POA: Diagnosis not present

## 2020-09-10 LAB — POCT INR: INR: 2 (ref 2.0–3.0)

## 2020-09-10 NOTE — Patient Instructions (Signed)
Description   Continue taking 1/2 tablet daily except 1 tablet on Saturdays. Recheck INR in 6 weeks. Call us with any new medications or concerns [phone] [facility], Main # [phone].

## 2020-09-13 NOTE — Progress Notes (Signed)
Patient scheduled for a virtual visit on 10/29/2020 at 4:30 PM

## 2020-09-17 ENCOUNTER — Encounter: Payer: Self-pay | Admitting: Family Medicine

## 2020-09-17 ENCOUNTER — Other Ambulatory Visit: Payer: Self-pay | Admitting: Family Medicine

## 2020-09-17 DIAGNOSIS — M549 Dorsalgia, unspecified: Secondary | ICD-10-CM

## 2020-09-17 DIAGNOSIS — G8929 Other chronic pain: Secondary | ICD-10-CM

## 2020-09-22 ENCOUNTER — Other Ambulatory Visit: Payer: Self-pay

## 2020-09-22 ENCOUNTER — Ambulatory Visit: Payer: Medicare Other | Attending: Internal Medicine

## 2020-09-22 DIAGNOSIS — Z23 Encounter for immunization: Secondary | ICD-10-CM

## 2020-09-22 NOTE — Progress Notes (Signed)
   Covid-19 Vaccination Clinic  Name:  [name]    MRN: [id number] DOB: 05-Apr-1946  09/22/2020  [name] was observed post Covid-19 immunization for 15 minutes without incident. He was provided with Vaccine Information Sheet and instruction to access the V-Safe system.   [name] was instructed to call 911 with any severe reactions post vaccine: Marland Kitchen Difficulty breathing  . Swelling of face and throat  . A fast heartbeat  . A bad rash all over body  . Dizziness and weakness   Immunizations Administered    Name Date Dose VIS Date Route   Pfizer COVID-19 Vaccine 09/22/2020  1:04 PM 0.3 mL 02/01/2019 Intramuscular   Manufacturer: [place]   Lot: [id number]   [place]: [id number]

## 2020-09-27 ENCOUNTER — Encounter: Payer: Self-pay | Admitting: Family Medicine

## 2020-10-01 DIAGNOSIS — M549 Dorsalgia, unspecified: Secondary | ICD-10-CM | POA: Diagnosis not present

## 2020-10-01 DIAGNOSIS — M545 Low back pain, unspecified: Secondary | ICD-10-CM | POA: Diagnosis not present

## 2020-10-01 DIAGNOSIS — G8929 Other chronic pain: Secondary | ICD-10-CM | POA: Diagnosis not present

## 2020-10-01 DIAGNOSIS — M542 Cervicalgia: Secondary | ICD-10-CM | POA: Diagnosis not present

## 2020-10-02 DIAGNOSIS — R1031 Right lower quadrant pain: Secondary | ICD-10-CM | POA: Diagnosis not present

## 2020-10-09 DIAGNOSIS — M545 Low back pain, unspecified: Secondary | ICD-10-CM | POA: Diagnosis not present

## 2020-10-09 DIAGNOSIS — M549 Dorsalgia, unspecified: Secondary | ICD-10-CM | POA: Diagnosis not present

## 2020-10-09 DIAGNOSIS — M542 Cervicalgia: Secondary | ICD-10-CM | POA: Diagnosis not present

## 2020-10-09 DIAGNOSIS — G8929 Other chronic pain: Secondary | ICD-10-CM | POA: Diagnosis not present

## 2020-10-15 DIAGNOSIS — M542 Cervicalgia: Secondary | ICD-10-CM | POA: Diagnosis not present

## 2020-10-15 DIAGNOSIS — M545 Low back pain, unspecified: Secondary | ICD-10-CM | POA: Diagnosis not present

## 2020-10-15 DIAGNOSIS — M549 Dorsalgia, unspecified: Secondary | ICD-10-CM | POA: Diagnosis not present

## 2020-10-15 DIAGNOSIS — G8929 Other chronic pain: Secondary | ICD-10-CM | POA: Diagnosis not present

## 2020-10-17 ENCOUNTER — Other Ambulatory Visit: Payer: Self-pay

## 2020-10-17 ENCOUNTER — Ambulatory Visit: Payer: Medicare Other

## 2020-10-22 ENCOUNTER — Ambulatory Visit (INDEPENDENT_AMBULATORY_CARE_PROVIDER_SITE_OTHER): Payer: Medicare Other | Admitting: *Deleted

## 2020-10-22 ENCOUNTER — Other Ambulatory Visit: Payer: Self-pay

## 2020-10-22 DIAGNOSIS — Z5181 Encounter for therapeutic drug level monitoring: Secondary | ICD-10-CM

## 2020-10-22 DIAGNOSIS — D6859 Other primary thrombophilia: Secondary | ICD-10-CM

## 2020-10-22 LAB — POCT INR: INR: 1.9 — AB (ref 2.0–3.0)

## 2020-10-22 NOTE — Patient Instructions (Signed)
Description   Today take 1 tablet then continue taking 1/2 tablet daily except 1 tablet on Saturdays. Recheck INR in 4 weeks. Call us with any new medications or concerns [phone] [facility], Main # [phone].

## 2020-10-24 ENCOUNTER — Encounter: Payer: Self-pay | Admitting: Family Medicine

## 2020-10-24 ENCOUNTER — Telehealth (INDEPENDENT_AMBULATORY_CARE_PROVIDER_SITE_OTHER): Payer: Medicare Other | Admitting: Family Medicine

## 2020-10-24 VITALS — BP 137/71 | HR 54 | Ht 74.0 in

## 2020-10-24 DIAGNOSIS — I10 Essential (primary) hypertension: Secondary | ICD-10-CM | POA: Diagnosis not present

## 2020-10-24 DIAGNOSIS — E559 Vitamin D deficiency, unspecified: Secondary | ICD-10-CM | POA: Diagnosis not present

## 2020-10-24 DIAGNOSIS — R413 Other amnesia: Secondary | ICD-10-CM | POA: Diagnosis not present

## 2020-10-24 DIAGNOSIS — I25119 Atherosclerotic heart disease of native coronary artery with unspecified angina pectoris: Secondary | ICD-10-CM

## 2020-10-24 DIAGNOSIS — M159 Polyosteoarthritis, unspecified: Secondary | ICD-10-CM | POA: Diagnosis not present

## 2020-10-24 NOTE — Progress Notes (Addendum)
Virtual Visit via Video Note I connected with [name] on 10/24/20 by a video enabled telemedicine application and verified that I am speaking with the correct person using two identifiers.  Location patient: home Location provider:work office Persons participating in the virtual visit: patient, provider  I discussed the limitations of evaluation and management by telemedicine and the availability of in person appointments. The patient expressed understanding and agreed to proceed.  HPI: [name] is a 74 yo male with hx of HTN, generalized OA,CAD,and bradycardia being seen for follow-up. He was last seen on 08/28/2020, video visit. Since his last visit he has been doing PT, which has helped with joint pain. Hypertension: Currently he is on losartan 100 mg and amlodipine 7.5 mg daily, the latter one was adjusted last visit. He has noted improvement in BP readings: 130's/70's. He has tolerated medication well. He has not noted unusual headache, visual changes, abdominal pain, decreased urine output, gross hematuria, new neurologic focal deficit, or unusual edema.  Last visit tramadol 50 mg was also started to help with joint pain during trip. Tramadol helped with pain, he is no longer taking it.  States that sometimes he has some dizziness when doing PT exercises, he has no problem when he is not doing so even with exertion.  Negative for associated CP, dyspnea, palpitation, or diaphoresis.  New concerns today: Memory issues. His wife has been concerned about memory problems for some time, seem gradually getting worse. Problem was clearly noticeable during recent trip. He was not able to keep with schedule and activities during cruise. A few times his wife had to intervene. A few months ago she also found that he was having problems subtracting on checkbook, about $100 off.  Negative for unusual headache, night sweats, fever, sore throat, dysphagia, changes in bowel habits, cold/heat  intolerance, new tingling/numbness/burning sensation, or a skin rash. He and his wife do not think he has depression or anxiety. No known history of CVA. Strong family history for Alzheimer disease: Mother, uncles, aunts, and maternal grandmother.  Vitamin D deficiency: Currently he is on vitamin D 1000 units daily.  Last 25 OH vitamin D in 09/2018 was normal at 15.7.  He also takes B12 500 mcg daily. History of hypercalcemia.  Lab Results  Component Value Date   CREATININE 0.97 04/04/2020   BUN 15 04/04/2020   NA 142 04/04/2020   K 4.2 04/04/2020   CL 108 04/04/2020   CO2 27 04/04/2020   Lab Results  Component Value Date   WBC 4.5 04/04/2020   HGB 14.7 04/04/2020   HCT 44.7 04/04/2020   MCV 94.9 04/04/2020   PLT 213 04/04/2020   Lab Results  Component Value Date   TSH 1.38 02/21/2020   Lab Results  Component Value Date   VITAMINB12 275 03/27/2017   ROS: See pertinent positives and negatives per HPI.  Past Medical History:  Diagnosis Date  . Allergy   . Arthritis   . 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 Cataracts, bilateral   . Chicken pox   . Dyslipidemia   . Hyperlipidemia   . Hypertension   . Sinus bradycardia    Past Surgical History:  Procedure Laterality Date  . BYPASS GRAFT  1996  . CATARACT EXTRACTION Right   . INGUINAL HERNIA REPAIR Right 04/09/2020   Procedure: LAPAROSCOPIC RIGHT INGUINAL HERNIA REPAIR WITH MESH;  Surgeon: Ralene Ok, MD;  Location: [place];  Service: General;  Laterality: Right;  .  REFRACTIVE SURGERY Right   . SKIN BIOPSY     head x 2  . UMBILICAL HERNIA REPAIR  2003    Family History  Problem Relation Age of Onset  . Alzheimer's disease Mother   . Cancer Father        prostate  . Prostate cancer Father   . AAA (abdominal aortic aneurysm) Sister   . Hypertension Maternal Grandmother   . Hypertension Paternal Grandmother   . Colon cancer Neg Hx   . Esophageal cancer Neg Hx   . Pancreatic cancer Neg Hx    . Liver disease Neg Hx     Social History   Socioeconomic History  . Marital status: Married    Spouse name: [name]  . Number of children: 2  . Years of education: COLLEGE  . Highest education level: Not on file  Occupational History  . Occupation: RETIRED  Tobacco Use  . Smoking status: Former Smoker    Quit date: 05/29/1982    Years since quitting: 38.4  . Smokeless tobacco: Never Used  Substance and Sexual Activity  . Alcohol use: Yes    Comment: 0-2 drinks a night  . Drug use: No  . Sexual activity: Not on file  Other Topics Concern  . Not on file  Social History Narrative  . Not on file   Social Determinants of Health   Financial Resource Strain:   . Difficulty of Paying Living Expenses: Not on file  Food Insecurity:   . Worried About [place] [employer] in the Last Year: Not on file  . Ran Out of Food in the Last Year: Not on file  Transportation Needs:   . Lack of Transportation (Medical): Not on file  . Lack of Transportation (Non-Medical): Not on file  Physical Activity:   . Days of Exercise per Week: Not on file  . Minutes of Exercise per Session: Not on file  Stress:   . Feeling of Stress : Not on file  Social Connections:   . Frequency of Communication with Friends and Family: Not on file  . Frequency of Social Gatherings with Friends and Family: Not on file  . Attends Religious Services: Not on file  . Active Member of Clubs or Organizations: Not on file  . Attends Archivist Meetings: Not on file  . Marital Status: Not on file  Intimate Partner Violence:   . Fear of Current or Ex-Partner: Not on file  . Emotionally Abused: Not on file  . Physically Abused: Not on file  . Sexually Abused: Not on file    Current Outpatient Medications:  .  acetaminophen (TYLENOL) 500 MG tablet, Take 1,000 mg by mouth every 6 (six) hours as needed for moderate pain or headache. , Disp: , Rfl:  .  amLODipine (NORVASC) 5 MG tablet, TAKE 1 TABLET DAILY  (Patient taking differently: Take 5 mg by mouth daily. ), Disp: 90 tablet, Rfl: 3 .  cholecalciferol (VITAMIN D) 1000 units tablet, Take 1,000 Units by mouth daily. , Disp: , Rfl:  .  Cyanocobalamin (VITAMIN B12) 500 MCG TABS, Take 500 mcg by mouth daily. , Disp: , Rfl:  .  ezetimibe (ZETIA) 10 MG tablet, TAKE 1 TABLET AT BEDTIME (Patient taking differently: Take 10 mg by mouth at bedtime. ), Disp: 90 tablet, Rfl: 3 .  fenofibrate (TRICOR) 145 MG tablet, TAKE 1 TABLET AT BEDTIME (Patient taking differently: Take 145 mg by mouth every evening. ), Disp: 90 tablet, Rfl: 3 .  furosemide (LASIX) 20 MG tablet, Take 1 tablet (20 mg total) by mouth daily., Disp: 90 tablet, Rfl: 3 .  hydrOXYzine (ATARAX/VISTARIL) 25 MG tablet, Take 1 tablet (25 mg total) by mouth daily as needed for anxiety (daily as needed before flying.)., Disp: 30 tablet, Rfl: 0 .  losartan (COZAAR) 100 MG tablet, TAKE 1 TABLET DAILY (Patient taking differently: Take 100 mg by mouth every evening. ), Disp: 90 tablet, Rfl: 3 .  Olopatadine HCl (PATADAY OP), Place 1 drop into both eyes daily as needed (allergies)., Disp: , Rfl:  .  rosuvastatin (CRESTOR) 40 MG tablet, TAKE 1 TABLET AT BEDTIME (PLEASE KEEP UPCOMING APPOINTMENT FOR FUTURE REFILLS) (Patient taking differently: Take 40 mg by mouth every evening. ), Disp: 90 tablet, Rfl: 3 .  traMADol (ULTRAM) 50 MG tablet, Take 1 tablet (50 mg total) by mouth every 12 (twelve) hours as needed., Disp: 30 tablet, Rfl: 0 .  warfarin (COUMADIN) 5 MG tablet, TAKE AS DIRECTED BY COUMADIN CLINIC, Disp: 80 tablet, Rfl: 1 .  nitroGLYCERIN (NITROSTAT) 0.4 MG SL tablet, Place 1 tablet (0.4 mg total) every 5 (five) minutes as needed under the tongue for chest pain., Disp: 25 tablet, Rfl: 3  EXAM:  VITALS per patient if applicable:BP 510/25   Pulse (!) 54   Ht 6\' 2"  (1.88 m)   BMI 25.94 kg/m   GENERAL: alert, oriented in place and person,appears well and in no acute distress  HEENT: atraumatic,  conjunctiva clear, no obvious abnormalities on inspection of external nose and ears  NECK: normal movements of the head and neck  LUNGS: on inspection no signs of respiratory distress, breathing rate appears normal, no obvious gross SOB, gasping or wheezing  CV: no obvious cyanosis. Bradycardic.   MS: moves all visible extremities without noticeable abnormality  PSYCH/NEURO: pleasant and cooperative, no obvious depression or anxiety. He could remember 2 words out of 3, could not do substractions by 7's from 100, difficulty with 5' but able to do it.  ASSESSMENT AND PLAN:  Discussed the following assessment and plan: Orders Placed This Encounter  Procedures  . CBC with Differential/Platelet  . COMPLETE METABOLIC PANEL WITH GFR  . TSH  . Vitamin B12  . Parathyroid hormone, intact (no Ca)  . VITAMIN D 25 Hydroxy (Vit-D Deficiency, Fractures)  . Ambulatory referral to Neurology    Generalized osteoarthrosis, involving multiple sites Pain has improved with PT. Fall precautions. He is still has 28 tablets of tramadol 50 mg, recommend taking once daily if needed for pain.  We discussed some side effects. Tylenol 500 mg 3-4 times per day.  Essential hypertension BP adequately controlled. Continue amlodipine 7.5 mg daily and losartan 100 mg daily. Low-salt diet. Continue monitoring BP regularly.  Memory difficulties  We discussed possible etiologies. The likelihood that this could be beginning of Alzheimer's disease high. He prefers to hold on imaging. Blood work will be arranged. Neurology referral placed, he has seen Dr. Charlene Brooke.  Vitamin D deficiency, unspecified  Continue vitamin D at 1000 units daily. Further recommendation will be given according to 29 [place] vitamin D result.  Hypercalcemia Problem has been stable 10.5-11.3 Further recommendation according to lab results.   I discussed the assessment and treatment plan with the patient. [name] and his wife were  provided an opportunity to ask questions and all were answered. The patient agreed with the plan and demonstrated an understanding of the instructions.   Spent 46 minutes. During this time history was obtained and documented, prior  labs/imaging reviewed, and assessment/plan discussed.  Return for lab appt.  Tiran Sauseda [place], MD

## 2020-10-25 ENCOUNTER — Encounter: Payer: Self-pay | Admitting: Neurology

## 2020-10-25 NOTE — Progress Notes (Signed)
Patient scheduled for labs on 11/23 at 9 AM

## 2020-10-29 ENCOUNTER — Telehealth: Payer: Medicare Other | Admitting: Family Medicine

## 2020-10-30 ENCOUNTER — Other Ambulatory Visit: Payer: Medicare Other

## 2020-11-05 DIAGNOSIS — M545 Low back pain, unspecified: Secondary | ICD-10-CM | POA: Diagnosis not present

## 2020-11-05 DIAGNOSIS — M549 Dorsalgia, unspecified: Secondary | ICD-10-CM | POA: Diagnosis not present

## 2020-11-05 DIAGNOSIS — M542 Cervicalgia: Secondary | ICD-10-CM | POA: Diagnosis not present

## 2020-11-05 DIAGNOSIS — G8929 Other chronic pain: Secondary | ICD-10-CM | POA: Diagnosis not present

## 2020-11-12 DIAGNOSIS — M545 Low back pain, unspecified: Secondary | ICD-10-CM | POA: Diagnosis not present

## 2020-11-12 DIAGNOSIS — M549 Dorsalgia, unspecified: Secondary | ICD-10-CM | POA: Diagnosis not present

## 2020-11-12 DIAGNOSIS — M542 Cervicalgia: Secondary | ICD-10-CM | POA: Diagnosis not present

## 2020-11-12 DIAGNOSIS — G8929 Other chronic pain: Secondary | ICD-10-CM | POA: Diagnosis not present

## 2020-11-13 ENCOUNTER — Encounter: Payer: Self-pay | Admitting: Family Medicine

## 2020-11-13 DIAGNOSIS — H25012 Cortical age-related cataract, left eye: Secondary | ICD-10-CM | POA: Diagnosis not present

## 2020-11-13 DIAGNOSIS — D3131 Benign neoplasm of right choroid: Secondary | ICD-10-CM | POA: Diagnosis not present

## 2020-11-13 DIAGNOSIS — H2512 Age-related nuclear cataract, left eye: Secondary | ICD-10-CM | POA: Diagnosis not present

## 2020-11-13 DIAGNOSIS — H35033 Hypertensive retinopathy, bilateral: Secondary | ICD-10-CM | POA: Diagnosis not present

## 2020-11-15 ENCOUNTER — Other Ambulatory Visit: Payer: Self-pay | Admitting: Interventional Cardiology

## 2020-11-19 ENCOUNTER — Other Ambulatory Visit: Payer: Self-pay

## 2020-11-19 ENCOUNTER — Ambulatory Visit (INDEPENDENT_AMBULATORY_CARE_PROVIDER_SITE_OTHER): Payer: Medicare Other | Admitting: Pharmacy Technician

## 2020-11-19 DIAGNOSIS — M549 Dorsalgia, unspecified: Secondary | ICD-10-CM | POA: Diagnosis not present

## 2020-11-19 DIAGNOSIS — D6859 Other primary thrombophilia: Secondary | ICD-10-CM | POA: Diagnosis not present

## 2020-11-19 DIAGNOSIS — Z5181 Encounter for therapeutic drug level monitoring: Secondary | ICD-10-CM

## 2020-11-19 DIAGNOSIS — G8929 Other chronic pain: Secondary | ICD-10-CM | POA: Diagnosis not present

## 2020-11-19 DIAGNOSIS — M545 Low back pain, unspecified: Secondary | ICD-10-CM | POA: Diagnosis not present

## 2020-11-19 DIAGNOSIS — M542 Cervicalgia: Secondary | ICD-10-CM | POA: Diagnosis not present

## 2020-11-19 LAB — POCT INR: INR: 1.7 — AB (ref 2.0–3.0)

## 2020-11-19 NOTE — Patient Instructions (Addendum)
Description   Take an extra half tablet today (5mg  total), then start taking 1/2 tablet daily except 1 tablet on Wednesdays and Saturdays. Recheck INR in 3 weeks. Call us with any new medications or concerns [phone] [facility], Main # [phone].

## 2020-11-21 ENCOUNTER — Other Ambulatory Visit (INDEPENDENT_AMBULATORY_CARE_PROVIDER_SITE_OTHER): Payer: Medicare Other

## 2020-11-21 ENCOUNTER — Other Ambulatory Visit: Payer: Self-pay

## 2020-11-21 DIAGNOSIS — I10 Essential (primary) hypertension: Secondary | ICD-10-CM | POA: Diagnosis not present

## 2020-11-21 DIAGNOSIS — E782 Mixed hyperlipidemia: Secondary | ICD-10-CM

## 2020-11-21 DIAGNOSIS — R413 Other amnesia: Secondary | ICD-10-CM | POA: Diagnosis not present

## 2020-11-21 DIAGNOSIS — E559 Vitamin D deficiency, unspecified: Secondary | ICD-10-CM

## 2020-11-21 DIAGNOSIS — E538 Deficiency of other specified B group vitamins: Secondary | ICD-10-CM

## 2020-11-21 DIAGNOSIS — D72819 Decreased white blood cell count, unspecified: Secondary | ICD-10-CM

## 2020-11-21 LAB — COMPREHENSIVE METABOLIC PANEL
ALT: 21 U/L (ref 0–53)
AST: 26 U/L (ref 0–37)
Albumin: 4.7 g/dL (ref 3.5–5.2)
Alkaline Phosphatase: 32 U/L — ABNORMAL LOW (ref 39–117)
BUN: 15 mg/dL (ref 6–23)
CO2: 28 mEq/L (ref 19–32)
Calcium: 10.3 mg/dL (ref 8.4–10.5)
Chloride: 108 mEq/L (ref 96–112)
Creatinine, Ser: 0.86 mg/dL (ref 0.40–1.50)
GFR: 86.17 mL/min (ref >60.00)
Glucose, Bld: 88 mg/dL (ref 70–99)
Potassium: 4.2 mEq/L (ref 3.5–5.1)
Sodium: 142 mEq/L (ref 135–145)
Total Bilirubin: 0.8 mg/dL (ref 0.2–1.2)
Total Protein: 6.8 g/dL (ref 6.0–8.3)

## 2020-11-21 LAB — CBC WITH DIFFERENTIAL/PLATELET
Basophils Absolute: 0 10*3/uL (ref 0.0–0.1)
Basophils Relative: 0.6 % (ref 0.0–3.0)
Eosinophils Absolute: 0.1 10*3/uL (ref 0.0–0.7)
Eosinophils Relative: 4.1 % (ref 0.0–5.0)
HCT: 41.4 % (ref 39.0–52.0)
Hemoglobin: 13.9 g/dL (ref 13.0–17.0)
Lymphocytes Relative: 30.2 % (ref 12.0–46.0)
Lymphs Abs: 0.9 10*3/uL (ref 0.7–4.0)
MCHC: 33.5 g/dL (ref 30.0–36.0)
MCV: 94.1 fl (ref 78.0–100.0)
Monocytes Absolute: 0.3 10*3/uL (ref 0.1–1.0)
Monocytes Relative: 11.6 % (ref 3.0–12.0)
Neutro Abs: 1.5 10*3/uL (ref 1.4–7.7)
Neutrophils Relative %: 53.5 % (ref 43.0–77.0)
Platelets: 209 10*3/uL (ref 150.0–400.0)
RBC: 4.4 Mil/uL (ref 4.22–5.81)
RDW: 15.2 % (ref 11.5–15.5)
WBC: 2.9 10*3/uL — ABNORMAL LOW (ref 4.0–10.5)

## 2020-11-21 LAB — VITAMIN D 25 HYDROXY (VIT D DEFICIENCY, FRACTURES): VITD: 54.04 ng/mL (ref 30.00–100.00)

## 2020-11-21 LAB — TSH: TSH: 2.17 u[IU]/mL (ref 0.35–4.50)

## 2020-11-21 LAB — VITAMIN B12: Vitamin B-12: 647 pg/mL (ref 211–911)

## 2020-11-21 NOTE — Addendum Note (Signed)
Addended by: Marrion Coy on: 11/21/2020 07:20 AM   Modules accepted: Orders

## 2020-11-22 LAB — PARATHYROID HORMONE, INTACT (NO CA): PTH: 45 pg/mL (ref 14–64)

## 2020-11-26 ENCOUNTER — Ambulatory Visit (INDEPENDENT_AMBULATORY_CARE_PROVIDER_SITE_OTHER): Payer: Medicare Other

## 2020-11-26 ENCOUNTER — Other Ambulatory Visit: Payer: Self-pay

## 2020-11-26 VITALS — BP 138/74 | HR 62 | Temp 98.4°F | Ht 74.0 in | Wt 195.2 lb

## 2020-11-26 DIAGNOSIS — Z0000 Encounter for general adult medical examination without abnormal findings: Secondary | ICD-10-CM | POA: Diagnosis not present

## 2020-11-26 DIAGNOSIS — Z23 Encounter for immunization: Secondary | ICD-10-CM | POA: Diagnosis not present

## 2020-11-26 NOTE — Patient Instructions (Signed)
[name] , Thank you for taking time to come for your Medicare Wellness Visit. I appreciate your ongoing commitment to your health goals. Please review the following plan we discussed and let me know if I can assist you in the future.   Screening recommendations/referrals: Colonoscopy: Up to date, next due 06/13/2022 Recommended yearly ophthalmology/optometry visit for glaucoma screening and checkup Recommended yearly dental visit for hygiene and checkup  Vaccinations: Influenza vaccine: Up to date, next due fall 2022 Pneumococcal vaccine: Completed series Tdap vaccine: Currently due, if you wish to receive we recommend that you contact your insurance company or you may await and injury to receive Shingles vaccine: Currently due for Shingrix, if you wish to receive we recommend that you do so at your local pharmacy as it is less expensive     Advanced directives: Please bring copies of your advanced medical directives so that we may scan them into your chart  Conditions/risks identified: None   Next appointment: None   Preventive Care 65 Years and Older, Male Preventive care refers to lifestyle choices and visits with your health care provider that can promote health and wellness. What does preventive care include?  A yearly physical exam. This is also called an annual well check.  Dental exams once or twice a year.  Routine eye exams. Ask your health care provider how often you should have your eyes checked.  Personal lifestyle choices, including:  Daily care of your teeth and gums.  Regular physical activity.  Eating a healthy diet.  Avoiding tobacco and drug use.  Limiting alcohol use.  Practicing safe sex.  Taking low doses of aspirin every day.  Taking vitamin and mineral supplements as recommended by your health care provider. What happens during an annual well check? The services and screenings done by your health care provider during your annual well check  will depend on your age, overall health, lifestyle risk factors, and family history of disease. Counseling  Your health care provider may ask you questions about your:  Alcohol use.  Tobacco use.  Drug use.  Emotional well-being.  Home and relationship well-being.  Sexual activity.  Eating habits.  History of falls.  Memory and ability to understand (cognition).  Work and work Statistician. Screening  You may have the following tests or measurements:  Height, weight, and BMI.  Blood pressure.  Lipid and cholesterol levels. These may be checked every 5 years, or more frequently if you are over 25 years old.  Skin check.  Lung cancer screening. You may have this screening every year starting at age 72 if you have a 30-pack-year history of smoking and currently smoke or have quit within the past 15 years.  Fecal occult blood test (FOBT) of the stool. You may have this test every year starting at age 83.  Flexible sigmoidoscopy or colonoscopy. You may have a sigmoidoscopy every 5 years or a colonoscopy every 10 years starting at age 38.  Prostate cancer screening. Recommendations will vary depending on your family history and other risks.  Hepatitis C blood test.  Hepatitis B blood test.  Sexually transmitted disease (STD) testing.  Diabetes screening. This is done by checking your blood sugar (glucose) after you have not eaten for a while (fasting). You may have this done every 1-3 years.  Abdominal aortic aneurysm (AAA) screening. You may need this if you are a current or former smoker.  Osteoporosis. You may be screened starting at age 6 if you are at high risk.  Talk with your health care provider about your test results, treatment options, and if necessary, the need for more tests. Vaccines  Your health care provider may recommend certain vaccines, such as:  Influenza vaccine. This is recommended every year.  Tetanus, diphtheria, and acellular pertussis  (Tdap, Td) vaccine. You may need a Td booster every 10 years.  Zoster vaccine. You may need this after age 64.  Pneumococcal 13-valent conjugate (PCV13) vaccine. One dose is recommended after age 55.  Pneumococcal polysaccharide (PPSV23) vaccine. One dose is recommended after age 55. Talk to your health care provider about which screenings and vaccines you need and how often you need them. This information is not intended to replace advice given to you by your health care provider. Make sure you discuss any questions you have with your health care provider. Document Released: 12/21/2015 Document Revised: 08/13/2016 Document Reviewed: 09/25/2015 Elsevier Interactive Patient Education  2017 [place] Prevention in the Home Falls can cause injuries. They can happen to people of all ages. There are many things you can do to make your home safe and to help prevent falls. What can I do on the outside of my home?  Regularly fix the edges of walkways and driveways and fix any cracks.  Remove anything that might make you trip as you walk through a door, such as a raised step or threshold.  Trim any bushes or trees on the path to your home.  Use bright outdoor lighting.  Clear any walking paths of anything that might make someone trip, such as rocks or tools.  Regularly check to see if handrails are loose or broken. Make sure that both sides of any steps have handrails.  Any raised decks and porches should have guardrails on the edges.  Have any leaves, snow, or ice cleared regularly.  Use sand or salt on walking paths during winter.  Clean up any spills in your garage right away. This includes oil or grease spills. What can I do in the bathroom?  Use night lights.  Install grab bars by the toilet and in the tub and shower. Do not use towel bars as grab bars.  Use non-skid mats or decals in the tub or shower.  If you need to sit down in the shower, use a plastic, non-slip  stool.  Keep the floor dry. Clean up any water that spills on the floor as soon as it happens.  Remove soap buildup in the tub or shower regularly.  Attach bath mats securely with double-sided non-slip rug tape.  Do not have throw rugs and other things on the floor that can make you trip. What can I do in the bedroom?  Use night lights.  Make sure that you have a light by your bed that is easy to reach.  Do not use any sheets or blankets that are too big for your bed. They should not hang down onto the floor.  Have a firm chair that has side arms. You can use this for support while you get dressed.  Do not have throw rugs and other things on the floor that can make you trip. What can I do in the kitchen?  Clean up any spills right away.  Avoid walking on wet floors.  Keep items that you use a lot in easy-to-reach places.  If you need to reach something above you, use a strong step stool that has a grab bar.  Keep electrical cords out of the way.  Do not use floor polish or wax that makes floors slippery. If you must use wax, use non-skid floor wax.  Do not have throw rugs and other things on the floor that can make you trip. What can I do with my stairs?  Do not leave any items on the stairs.  Make sure that there are handrails on both sides of the stairs and use them. Fix handrails that are broken or loose. Make sure that handrails are as long as the stairways.  Check any carpeting to make sure that it is firmly attached to the stairs. Fix any carpet that is loose or worn.  Avoid having throw rugs at the top or bottom of the stairs. If you do have throw rugs, attach them to the floor with carpet tape.  Make sure that you have a light switch at the top of the stairs and the bottom of the stairs. If you do not have them, ask someone to add them for you. What else can I do to help prevent falls?  Wear shoes that:  Do not have high heels.  Have rubber bottoms.  Are  comfortable and fit you well.  Are closed at the toe. Do not wear sandals.  If you use a stepladder:  Make sure that it is fully opened. Do not climb a closed stepladder.  Make sure that both sides of the stepladder are locked into place.  Ask someone to hold it for you, if possible.  Clearly mark and make sure that you can see:  Any grab bars or handrails.  First and last steps.  Where the edge of each step is.  Use tools that help you move around (mobility aids) if they are needed. These include:  Canes.  Walkers.  Scooters.  Crutches.  Turn on the lights when you go into a dark area. Replace any light bulbs as soon as they burn out.  Set up your furniture so you have a clear path. Avoid moving your furniture around.  If any of your floors are uneven, fix them.  If there are any pets around you, be aware of where they are.  Review your medicines with your doctor. Some medicines can make you feel dizzy. This can increase your chance of falling. Ask your doctor what other things that you can do to help prevent falls. This information is not intended to replace advice given to you by your health care provider. Make sure you discuss any questions you have with your health care provider. Document Released: 09/20/2009 Document Revised: 05/01/2016 Document Reviewed: 12/29/2014 Elsevier Interactive Patient Education  2017 [employer].

## 2020-11-26 NOTE — Progress Notes (Signed)
Subjective:   [name] is a 74 y.o. male who presents for Medicare Annual/Subsequent preventive examination.  Review of Systems    N/A  Cardiac Risk Factors include: advanced age (>31men, >65 women);dyslipidemia;male gender;hypertension     Objective:    Today's Vitals   11/26/20 1502 11/26/20 1504  BP: 138/74   Pulse: 62   Temp: 98.4 F (36.9 C)   TempSrc: Oral   SpO2: 97%   Weight: 195 lb 4 oz (88.6 kg)   Height: 6\' 2"  (1.88 m)   PainSc:  4    Body mass index is 25.07 kg/m.  Advanced Directives 11/26/2020 04/09/2020 04/04/2020 03/23/2018 04/17/2017 03/31/2017  Does Patient Have a Medical Advance Directive? Yes Yes Yes Yes No Yes  Type of Paramedic of [place];Living will Living will;Healthcare Power of [place];Living will  Does patient want to make changes to medical advance directive? No - Patient declined No - Patient declined No - Patient declined - - No - Patient declined  Copy of [place] in Chart? No - copy requested Yes - validated most recent copy scanned in chart (See row information) No - copy requested - - Yes  Would patient like information on creating a medical advance directive? - No - Patient declined No - Patient declined - - -    Current Medications (verified) Outpatient Encounter Medications as of 11/26/2020  Medication Sig  . acetaminophen (TYLENOL) 500 MG tablet Take 1,000 mg by mouth every 6 (six) hours as needed for moderate pain or headache.   Marland Kitchen amLODipine (NORVASC) 5 MG tablet TAKE 1 TABLET DAILY (Patient taking differently: Take 5 mg by mouth daily.)  . cholecalciferol (VITAMIN D) 1000 units tablet Take 1,000 Units by mouth daily.   . Cyanocobalamin (VITAMIN B12) 500 MCG TABS Take 500 mcg by mouth daily.   Marland Kitchen ezetimibe (ZETIA) 10 MG tablet TAKE 1 TABLET AT BEDTIME  . fenofibrate (TRICOR) 145 MG tablet TAKE 1 TABLET AT BEDTIME  (Patient taking differently: Take 145 mg by mouth every evening.)  . furosemide (LASIX) 20 MG tablet Take 1 tablet (20 mg total) by mouth daily.  Marland Kitchen losartan (COZAAR) 100 MG tablet TAKE 1 TABLET DAILY (Patient taking differently: Take 100 mg by mouth every evening.)  . Olopatadine HCl (PATADAY OP) Place 1 drop into both eyes daily as needed (allergies).  . rosuvastatin (CRESTOR) 40 MG tablet TAKE 1 TABLET AT BEDTIME (PLEASE KEEP UPCOMING APPOINTMENT FOR FUTURE REFILLS) (Patient taking differently: Take 40 mg by mouth every evening.)  . traMADol (ULTRAM) 50 MG tablet Take 1 tablet (50 mg total) by mouth every 12 (twelve) hours as needed.  . warfarin (COUMADIN) 5 MG tablet TAKE AS DIRECTED BY COUMADIN CLINIC  . hydrOXYzine (ATARAX/VISTARIL) 25 MG tablet Take 1 tablet (25 mg total) by mouth daily as needed for anxiety (daily as needed before flying.). (Patient not taking: Reported on 11/26/2020)  . nitroGLYCERIN (NITROSTAT) 0.4 MG SL tablet Place 1 tablet (0.4 mg total) every 5 (five) minutes as needed under the tongue for chest pain. (Patient not taking: Reported on 11/26/2020)   No facility-administered encounter medications on file as of 11/26/2020.    Allergies (verified) Other and Latex   History: Past Medical History:  Diagnosis Date  . Allergy   . Arthritis   . 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 Cataracts, bilateral   . Chicken pox   .  Dyslipidemia   . Hyperlipidemia   . Hypertension   . Sinus bradycardia    Past Surgical History:  Procedure Laterality Date  . BYPASS GRAFT  1996  . CATARACT EXTRACTION Right   . INGUINAL HERNIA REPAIR Right 04/09/2020   Procedure: LAPAROSCOPIC RIGHT INGUINAL HERNIA REPAIR WITH MESH;  Surgeon: Ralene Ok, MD;  Location: [facility];  Service: General;  Laterality: Right;  . REFRACTIVE SURGERY Right   . SKIN BIOPSY     head x 2  . UMBILICAL HERNIA REPAIR  2003   Family History  Problem Relation Age of Onset  . Alzheimer's  disease Mother   . Cancer Father        prostate  . Prostate cancer Father   . AAA (abdominal aortic aneurysm) Sister   . Hypertension Maternal Grandmother   . Hypertension Paternal Grandmother   . Colon cancer Neg Hx   . Esophageal cancer Neg Hx   . Pancreatic cancer Neg Hx   . Liver disease Neg Hx    Social History   Socioeconomic History  . Marital status: Married    Spouse name: [name]  . Number of children: 2  . Years of education: COLLEGE  . Highest education level: Not on file  Occupational History  . Occupation: RETIRED  Tobacco Use  . Smoking status: Former Smoker    Quit date: 05/29/1982    Years since quitting: 38.5  . Smokeless tobacco: Never Used  Substance and Sexual Activity  . Alcohol use: Yes    Comment: 0-2 drinks a night  . Drug use: No  . Sexual activity: Not on file  Other Topics Concern  . Not on file  Social History Narrative  . Not on file   Social Determinants of Health   Financial Resource Strain: Low Risk   . Difficulty of Paying Living Expenses: Not hard at all  Food Insecurity: No Food Insecurity  . Worried About Charity fundraiser in the Last Year: Never true  . Ran Out of Food in the Last Year: Never true  Transportation Needs: No Transportation Needs  . Lack of Transportation (Medical): No  . Lack of Transportation (Non-Medical): No  Physical Activity: Inactive  . Days of Exercise per Week: 0 days  . Minutes of Exercise per Session: 0 min  Stress: No Stress Concern Present  . Feeling of Stress : Not at all  Social Connections: Moderately Isolated  . Frequency of Communication with Friends and Family: More than three times a week  . Frequency of Social Gatherings with Friends and Family: Once a week  . Attends Religious Services: Never  . Active Member of Clubs or Organizations: No  . Attends Archivist Meetings: Never  . Marital Status: Married    Tobacco Counseling Counseling given: Not Answered   Clinical  Intake:  Pre-visit preparation completed: Yes  Pain : 0-10 Pain Score: 4  Pain Type: Chronic pain Pain Location:  (diffused) Pain Descriptors / Indicators: Aching (electrical shocks) Pain Onset: More than a month ago Pain Frequency: Constant Pain Relieving Factors: sleep, hot and cold compresses  Pain Relieving Factors: sleep, hot and cold compresses  Nutritional Risks: None Diabetes: No  How often do you need to have someone help you when you read instructions, pamphlets, or other written materials from your doctor or pharmacy?: 1 - Never What is the last grade level you completed in school?: College  Diabetic? No   Interpreter Needed?: No  Information entered by :: SCrews,LPN  Activities of Daily Living In your present state of health, do you have any difficulty performing the following activities: 11/26/2020 04/04/2020  Hearing? N N  Vision? N N  Difficulty concentrating or making decisions? N N  Walking or climbing stairs? N N  Dressing or bathing? N N  Doing errands, shopping? N N  Preparing Food and eating ? N -  Using the Toilet? N -  In the past six months, have you accidently leaked urine? Y -  Do you have problems with loss of bowel control? N -  Managing your Medications? N -  Managing your Finances? N -  Housekeeping or managing your Housekeeping? N -  Some recent data might be hidden    Patient Care Team: [place], Betty G, MD as PCP - General (Family Medicine) Jettie Booze, MD as PCP - Cardiology (Cardiology)  Indicate any recent Medical Services you may have received from other than [facility] providers in the past year (date may be approximate).     Assessment:   This is a routine wellness examination for [place].  Hearing/Vision screen  Hearing Screening   125Hz  250Hz  500Hz  1000Hz  2000Hz  3000Hz  4000Hz  6000Hz  8000Hz   Right ear:           Left ear:           Vision Screening Comments: Patient states gets eyes examined once per year    Dietary issues and exercise activities discussed: Current Exercise Habits: The patient does not participate in regular exercise at present  Goals    . Exercise 3x per week (30 min per time)    . Patient Stated     Get back on the walker to walk more  Can walk in the neighborhood       Depression Screen PHQ 2/9 Scores 11/26/2020 08/28/2020 03/29/2019 03/23/2018  PHQ - 2 Score 0 0 0 0  PHQ- 9 Score 0 - - -    Fall Risk Fall Risk  11/26/2020 08/28/2020 03/29/2019 03/23/2018 08/17/2017  Falls in the past year? 0 0 0 No No  Number falls in past yr: 0 0 0 - -  Injury with Fall? 0 0 0 - -  Risk for fall due to : Impaired balance/gait Impaired balance/gait;Orthopedic patient Impaired balance/gait - -  Follow up Falls evaluation completed;Falls prevention discussed Education provided Education provided - -    FALL RISK PREVENTION PERTAINING TO THE HOME:  Any stairs in or around the home? Yes  If so, are there any without handrails? No  Home free of loose throw rugs in walkways, pet beds, electrical cords, etc? Yes  Adequate lighting in your home to reduce risk of falls? Yes   ASSISTIVE DEVICES UTILIZED TO PREVENT FALLS:  Life alert? No  Use of a cane, walker or w/c? Yes  Grab bars in the bathroom? Yes  Shower chair or bench in shower? No  Elevated toilet seat or a handicapped toilet? Yes   TIMED UP AND GO:  Was the test performed? Yes .  Length of time to ambulate 10 feet: 10 sec.   Gait slow and steady with assistive device  Cognitive Function: Normal cognitive status assessed by direct observation by this Nurse Health Advisor. No abnormalities found.   MMSE - Mini Mental State Exam 03/23/2018  Not completed: (No Data)        Immunizations Immunization History  Administered Date(s) Administered  . Influenza,inj,Quad PF,6+ Mos 10/16/2017, 09/20/2018, 09/06/2019, 11/26/2020  . PFIZER SARS-COV-2 Vaccination 01/15/2020, 02/09/2020, 09/22/2020  .  Pneumococcal Conjugate-13  11/15/2015  . Pneumococcal-Unspecified 11/08/2014    TDAP status: Due, Education has been provided regarding the importance of this vaccine. Advised may receive this vaccine at local pharmacy or Health Dept. Aware to provide a copy of the vaccination record if obtained from local pharmacy or Health Dept. Verbalized acceptance and understanding.  Flu Vaccine status: Completed at today's visit  Pneumococcal vaccine status: Up to date  Covid-19 vaccine status: Completed vaccines  Qualifies for Shingles Vaccine? Yes   Zostavax completed No   Shingrix Completed?: No.    Education has been provided regarding the importance of this vaccine. Patient has been advised to call insurance company to determine out of pocket expense if they have not yet received this vaccine. Advised may also receive vaccine at local pharmacy or Health Dept. Verbalized acceptance and understanding.  Screening Tests Health Maintenance  Topic Date Due  . COVID-19 Vaccine (4 - Booster for Pfizer series) 03/23/2021  . COLONOSCOPY  06/13/2022  . INFLUENZA VACCINE  Completed  . Hepatitis C Screening  Completed  . PNA vac Low Risk Adult  Completed  . TETANUS/TDAP  Discontinued    Health Maintenance  There are no preventive care reminders to display for this patient.  Colorectal cancer screening: Type of screening: Colonoscopy. Completed 06/14/2019. Repeat every 3 years  Lung Cancer Screening: (Low Dose CT Chest recommended if Age 23-80 years, 30 pack-year currently smoking OR have quit w/in 15years.) does not qualify.   Lung Cancer Screening Referral: N/A  Additional Screening:  Hepatitis C Screening: does qualify; Completed 03/29/2018   Vision Screening: Recommended annual ophthalmology exams for early detection of glaucoma and other disorders of the eye. Is the patient up to date with their annual eye exam?  Yes  Who is the provider or what is the name of the office in which the patient attends annual eye  exams? Dr. Kathlen Mody If pt is not established with a provider, would they like to be referred to a provider to establish care? No .   Dental Screening: Recommended annual dental exams for proper oral hygiene  Community Resource Referral / Chronic Care Management: CRR required this visit?  No   CCM required this visit?  No      Plan:     I have personally reviewed and noted the following in the patient's chart:   . Medical and social history . Use of alcohol, tobacco or illicit drugs  . Current medications and supplements . Functional ability and status . Nutritional status . Physical activity . Advanced directives . List of other physicians . Hospitalizations, surgeries, and ER visits in previous 12 months . Vitals . Screenings to include cognitive, depression, and falls . Referrals and appointments  In addition, I have reviewed and discussed with patient certain preventive protocols, quality metrics, and best practice recommendations. A written personalized care plan for preventive services as well as general preventive health recommendations were provided to patient.     Ofilia Neas, LPN   [id number]   Nurse Notes: None

## 2020-11-27 DIAGNOSIS — M549 Dorsalgia, unspecified: Secondary | ICD-10-CM | POA: Diagnosis not present

## 2020-11-27 DIAGNOSIS — M545 Low back pain, unspecified: Secondary | ICD-10-CM | POA: Diagnosis not present

## 2020-11-27 DIAGNOSIS — G8929 Other chronic pain: Secondary | ICD-10-CM | POA: Diagnosis not present

## 2020-11-27 DIAGNOSIS — M542 Cervicalgia: Secondary | ICD-10-CM | POA: Diagnosis not present

## 2020-11-29 DIAGNOSIS — M549 Dorsalgia, unspecified: Secondary | ICD-10-CM | POA: Diagnosis not present

## 2020-11-29 DIAGNOSIS — G8929 Other chronic pain: Secondary | ICD-10-CM | POA: Diagnosis not present

## 2020-11-29 DIAGNOSIS — M542 Cervicalgia: Secondary | ICD-10-CM | POA: Diagnosis not present

## 2020-11-29 DIAGNOSIS — M545 Low back pain, unspecified: Secondary | ICD-10-CM | POA: Diagnosis not present

## 2020-12-03 ENCOUNTER — Encounter: Payer: Self-pay | Admitting: Family Medicine

## 2020-12-04 ENCOUNTER — Other Ambulatory Visit: Payer: Self-pay | Admitting: Family Medicine

## 2020-12-04 DIAGNOSIS — I10 Essential (primary) hypertension: Secondary | ICD-10-CM

## 2020-12-04 MED ORDER — AMLODIPINE BESYLATE 5 MG PO TABS: 7.5000 mg | ORAL_TABLET | Freq: Every day | ORAL | 2 refills | Status: DC

## 2020-12-05 NOTE — Progress Notes (Signed)
NEUROLOGY FOLLOW UP OFFICE NOTE  Eliam Thilges [id number]   Subjective:  [name] is a 74 year old right-handed male with HTN, CAD, bradycardia and OA who presents for memory problems.  He is accompanied by his wife who supplements history.  His wife has noted memory problems since last year that has gradually progressed.  He started having trouble calculating the checkbook and would be off by several hundreds of dollars.  He also quickly forgets conversations and would repeat questions after just minutes.  He hasn't driven for years but as a passenger he denies disorientation on familiar routes. On a recent cruise, he had trouble keeping up with the schedule and activities.  He has a system to remember to take his medications independently.  No difficulty with performing home chores or activities.  At first, it was thought to be related to his chronic back/radicular pain but now the memory problems appear to be out of proportion to being in pain.    He has family history of Alzheimer's disease on his mother's side, including his mother, aunts, uncles and grandmother.  Recent labs include B12 647 (on oral supplement) and TSH 2.17.  PAST MEDICAL HISTORY: Past Medical History:  Diagnosis Date  . Allergy   . Arthritis   . 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 Cataracts, bilateral   . Chicken pox   . Dyslipidemia   . Hyperlipidemia   . Hypertension   . Sinus bradycardia     MEDICATIONS: Current Outpatient Medications on File Prior to Visit  Medication Sig Dispense Refill  . acetaminophen (TYLENOL) 500 MG tablet Take 1,000 mg by mouth every 6 (six) hours as needed for moderate pain or headache.     Marland Kitchen amLODipine (NORVASC) 5 MG tablet Take 1.5 tablets (7.5 mg total) by mouth daily. 135 tablet 2  . cholecalciferol (VITAMIN D) 1000 units tablet Take 1,000 Units by mouth daily.     . Cyanocobalamin (VITAMIN B12) 500 MCG TABS Take 500 mcg by mouth daily.     Marland Kitchen  ezetimibe (ZETIA) 10 MG tablet TAKE 1 TABLET AT BEDTIME 90 tablet 3  . fenofibrate (TRICOR) 145 MG tablet TAKE 1 TABLET AT BEDTIME (Patient taking differently: Take 145 mg by mouth every evening.) 90 tablet 3  . furosemide (LASIX) 20 MG tablet Take 1 tablet (20 mg total) by mouth daily. 90 tablet 3  . hydrOXYzine (ATARAX/VISTARIL) 25 MG tablet Take 1 tablet (25 mg total) by mouth daily as needed for anxiety (daily as needed before flying.). (Patient not taking: Reported on 11/26/2020) 30 tablet 0  . losartan (COZAAR) 100 MG tablet TAKE 1 TABLET DAILY (Patient taking differently: Take 100 mg by mouth every evening.) 90 tablet 3  . nitroGLYCERIN (NITROSTAT) 0.4 MG SL tablet Place 1 tablet (0.4 mg total) every 5 (five) minutes as needed under the tongue for chest pain. (Patient not taking: Reported on 11/26/2020) 25 tablet 3  . Olopatadine HCl (PATADAY OP) Place 1 drop into both eyes daily as needed (allergies).    . rosuvastatin (CRESTOR) 40 MG tablet TAKE 1 TABLET AT BEDTIME (PLEASE KEEP UPCOMING APPOINTMENT FOR FUTURE REFILLS) (Patient taking differently: Take 40 mg by mouth every evening.) 90 tablet 3  . traMADol (ULTRAM) 50 MG tablet Take 1 tablet (50 mg total) by mouth every 12 (twelve) hours as needed. 30 tablet 0  . warfarin (COUMADIN) 5 MG tablet TAKE AS DIRECTED BY COUMADIN CLINIC 80 tablet 1  No current facility-administered medications on file prior to visit.    ALLERGIES: Allergies  Allergen Reactions  . Other     Environmental allergies  . Latex     Itchy hands    FAMILY HISTORY: Family History  Problem Relation Age of Onset  . Alzheimer's disease Mother   . Cancer Father        prostate  . Prostate cancer Father   . AAA (abdominal aortic aneurysm) Sister   . Hypertension Maternal Grandmother   . Hypertension Paternal Grandmother   . Colon cancer Neg Hx   . Esophageal cancer Neg Hx   . Pancreatic cancer Neg Hx   . Liver disease Neg Hx     SOCIAL HISTORY: Social  History   Socioeconomic History  . Marital status: Married    Spouse name: [name]  . Number of children: 2  . Years of education: COLLEGE  . Highest education level: Not on file  Occupational History  . Occupation: RETIRED  Tobacco Use  . Smoking status: Former Smoker    Quit date: 05/29/1982    Years since quitting: 38.5  . Smokeless tobacco: Never Used  Substance and Sexual Activity  . Alcohol use: Yes    Comment: 0-2 drinks a night  . Drug use: No  . Sexual activity: Not on file  Other Topics Concern  . Not on file  Social History Narrative  . Not on file   Social Determinants of Health   Financial Resource Strain: Low Risk   . Difficulty of Paying Living Expenses: Not hard at all  Food Insecurity: No Food Insecurity  . Worried About Charity fundraiser in the Last Year: Never true  . Ran Out of Food in the Last Year: Never true  Transportation Needs: No Transportation Needs  . Lack of Transportation (Medical): No  . Lack of Transportation (Non-Medical): No  Physical Activity: Inactive  . Days of Exercise per Week: 0 days  . Minutes of Exercise per Session: 0 min  Stress: No Stress Concern Present  . Feeling of Stress : Not at all  Social Connections: Moderately Isolated  . Frequency of Communication with Friends and Family: More than three times a week  . Frequency of Social Gatherings with Friends and Family: Once a week  . Attends Religious Services: Never  . Active Member of Clubs or Organizations: No  . Attends Archivist Meetings: Never  . Marital Status: Married  Human resources officer Violence: Not At Risk  . Fear of Current or Ex-Partner: No  . Emotionally Abused: No  . Physically Abused: No  . Sexually Abused: No     Objective:  Blood pressure (!) 154/82, pulse 72, height 6' (1.829 m), weight 194 lb 12.8 oz (88.4 kg), SpO2 99 %. General: No acute distress.  Patient appears well-groomed.   Head:  Normocephalic/atraumatic Eyes:  Fundi examined  but not visualized Neck: supple, no paraspinal tenderness, full range of motion Heart:  Regular rate and rhythm Lungs:  Clear to auscultation bilaterally Back: No paraspinal tenderness Neurological Exam:  [employer] [facility] Exam 12/06/2020  Weekday Correct 1  Current year 1  What state are we in? 1  Amount spent 1  Amount left 0  # of Animals 2  5 objects recall 0  Number series 2  Hour markers 0  Time correct 0  Placed X in triangle correctly 1  Largest Figure 1  Name of male 2  Date back to work 2  Type  of work 2  State she lived in 2  Total score 18   CN II-XII intact. Bulk and tone normal, muscle strength 5/5 throughout.  Sensation to pinprick reduced in feet, vibration intact.  Deep tendon reflexes 1+ throughout, toes downgoing.  Finger to nose and heel to shin testing intact.  Gait wide-based.  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  Will check MRI of brain without contrast 2.  Start donepezil 5mg  at bedtime for one month, followed by 10mg  at bedtime 3.  Discussed lifestyle modification:  Mediterranean diet, social interaction, mental activities, continue exercise) 4.  Follow up 6 months.   , DO  CC:  Betty , MD

## 2020-12-06 ENCOUNTER — Other Ambulatory Visit: Payer: Self-pay

## 2020-12-06 ENCOUNTER — Ambulatory Visit (INDEPENDENT_AMBULATORY_CARE_PROVIDER_SITE_OTHER): Payer: Medicare Other | Admitting: Neurology

## 2020-12-06 ENCOUNTER — Encounter: Payer: Self-pay | Admitting: Neurology

## 2020-12-06 VITALS — BP 154/82 | HR 72 | Ht 72.0 in | Wt 194.8 lb

## 2020-12-06 DIAGNOSIS — G309 Alzheimer's disease, unspecified: Secondary | ICD-10-CM | POA: Diagnosis not present

## 2020-12-06 DIAGNOSIS — G3184 Mild cognitive impairment, so stated: Secondary | ICD-10-CM | POA: Diagnosis not present

## 2020-12-06 DIAGNOSIS — I25119 Atherosclerotic heart disease of native coronary artery with unspecified angina pectoris: Secondary | ICD-10-CM | POA: Diagnosis not present

## 2020-12-06 DIAGNOSIS — F0670 Mild neurocognitive disorder due to known physiological condition without behavioral disturbance: Secondary | ICD-10-CM

## 2020-12-06 MED ORDER — DONEPEZIL HCL 5 MG PO TABS
5.0000 mg | ORAL_TABLET | Freq: Every day | ORAL | 0 refills | Status: DC
Start: 2020-12-06 — End: 2020-12-28

## 2020-12-06 NOTE — Patient Instructions (Addendum)
1.  We will start donepezil (Aricept) 5mg  daily for four weeks.  If you are tolerating the medication, then after four weeks, we will increase the dose to 10mg  daily.  Side effects include nausea, vomiting, diarrhea, vivid dreams, and muscle cramps.  Please call the clinic if you experience any of these symptoms.  2.  Check MRI of brain without contrast Alzheimer's Disease Alzheimer's disease is a brain disease that affects memory, thinking, language, and behavior. People with Alzheimer's disease lose mental abilities, and the disease gets worse over time. Alzheimer's disease is a form of dementia. What are the causes? This condition develops when a protein called beta-amyloid forms deposits in the brain. It is not known what causes these deposits to form. Alzheimer's disease may also be caused by a gene mutation that is inherited from one parent or both parents. A gene mutation is a harmful change in a gene. Not everyone who inherits the genetic mutation will get the disease. What increases the risk? You are more likely to develop this condition if you:  Are older than age 18.  Have a family history of dementia.  Have had a brain injury.  Have heart or blood vessel disease.  Have had a stroke.  Have high blood pressure or high cholesterol.  Have diabetes. What are the signs or symptoms? Symptoms of this condition may happen in three stages, which often overlap. Early stage In this stage, you may continue to be independent. You may still be able to drive, work, and be social. Symptoms in this stage include:  Minor memory problems, such as forgetting a name or what you read.  Difficulty with: ? Paying attention. ? Communicating. ? Doing familiar tasks. ? Problem solving or doing calculations. ? Following instructions. ? Learning new things.  Anxiety.  Social withdrawal.  Loss of motivation. Moderate stage In this stage, you will start to need care. Symptoms in this stage  include:  Difficulty with expressing thoughts.  Memory loss that affects daily life. This can include forgetting: ? Your address or phone number. ? Recent events that have happened. ? Parts of your personal history, such as where you went to school.  Confusion about where you are or what time it is.  Difficulty in judging distance.  Changes in personality, mood, and behavior. You may be moody, irritable, angry, frustrated, fearful, anxious, or suspicious.  Poor reasoning and judgment.  Delusions or hallucinations.  Changes in sleep patterns.  Wandering and getting lost, even in familiar places. Severe stage In the final stage, you will need help with your personal care and daily activities. Symptoms in this stage include:  Worsening memory loss.  Personality changes.  Loss of awareness of your surroundings.  Changes in physical abilities, including the ability to walk, sit, and swallow.  Difficulty in communicating.  Inability to control your bladder and bowels.  Increasing confusion.  Increasing behavior changes. How is this diagnosed? This condition is diagnosed by a health care provider who specializes in diseases of the nervous system (neurologist). Other causes of dementia may also be ruled out. Your health care provider will talk with you and your family, friends, or caregivers about your history and symptoms. A thorough medical history will be taken, and you will have a physical exam and tests. Tests may include:  Lab tests, such as blood or urine tests.  Imaging tests, such as a CT scan, a PET scan, or an MRI.  A lumbar puncture. This test involves removing and testing  a small amount of the fluid that surrounds the brain and spinal cord.  An electroencephalogram (EEG). In this test, small metal discs are used to measure electrical activity in the brain.  Memory tests, cognitive tests, and neuropsychological tests. These tests evaluate brain  function.  Genetic testing may be done if you have early onset of the disease (before age 25) or if other family members have the disease. How is this treated? At this time, there is no treatment to cure Alzheimer's disease or stop it from getting worse. The goals of treatment are:  To slow down symptoms of the disease, if possible.  To manage behavioral changes.  To provide you with a safe environment.  To help manage daily life for you and your caregivers. The following treatment options are available:  Medicines. Medicines may help to slow down memory loss and manage behavioral symptoms.  Cognitive therapy. Cognitive therapy provides you with education, support, and memory aids. It is most helpful in the early stages of the condition.  Counseling or spiritual guidance. It is normal to have a lot of feelings, including anger, relief, fear, and isolation. Counseling and guidance can help you deal with these feelings.  Caregiving. This involves having caregivers help you with your daily activities.  Family support groups. These provide education, emotional support, and information about community resources to family members who are taking care of you. Follow these instructions at home:  Medicines  Take over-the-counter and prescription medicines only as told by your health care provider.  Use a pill organizer or pill reminder to help you manage your medicines.  Avoid taking medicines that can affect thinking, such as pain medicines or sleeping medicines. Lifestyle  Make healthy lifestyle choices: ? Be physically active as told by your health care provider. Regular exercise may help improve symptoms. ? Do not use any products that contain nicotine or tobacco, such as cigarettes, e-cigarettes, and chewing tobacco. If you need help quitting, ask your health care provider. ? Do not drink alcohol. ? Eat a healthy diet. ? Practice stress-management techniques when you get  stressed. ? Stay social.  Drink enough fluid to keep your urine pale yellow.  Make sure to get quality sleep. ? Avoid taking long naps during the day. Take short naps of 30 minutes or less if needed. ? Keep your sleeping area dark and cool. ? Avoid exercising during the few hours before you go to bed. ? Avoid caffeine products in the afternoon and evening. General instructions  Work with your health care provider to determine what you need help with and what your safety needs are.  If you were given a bracelet that identifies you as a person with memory loss or tracks your location, make sure to wear it at all times.  Talk with your health care provider about whether it is safe for you to drive.  Work with your family to make important decisions, such as advance directives, medical power of attorney, or a living will.  Keep all follow-up visits as told by your health care provider. This is important. Where to find more information  The Alzheimer's Association: Call the 24-hour helpline at 445-771-6538, or visit LimitLaws.hu Contact a health care provider if:  You have nausea, vomiting, or trouble with eating.  You have dizziness or weakness.  You or your family members become concerned for your safety. Get help right away if:  You feel depressed or sad, or feel that you want to harm yourself.  You  develop chest pain or difficulty with breathing.  You pass out. If you ever feel like you may hurt yourself or others, or have thoughts about taking your own life, get help right away. You can go to your nearest emergency department or call:  Your local emergency services (911 in the [place]  A suicide crisis helpline, such as the National Suicide Prevention Lifeline at (564)562-6236. This is open 24 hours a day. Summary  Alzheimer's disease is a brain disease that affects memory, thinking, language, and behavior. Alzheimer's disease is a form of dementia.  This condition is  diagnosed by a specialist in diseases of the nervous system (neurologist).  At this time, there is no treatment to cure Alzheimer's disease or stop it from getting worse. The goals of treatment are to slow memory loss and help you manage any symptoms.  Work with your family to make important decisions, such as advance directives, medical power of attorney, or a living will. This information is not intended to replace advice given to you by your health care provider. Make sure you discuss any questions you have with your health care provider.    Mediterranean Diet A Mediterranean diet refers to food and lifestyle choices that are based on the traditions of countries located on the [employer]. This way of eating has been shown to help prevent certain conditions and improve outcomes for people who have chronic diseases, like kidney disease and heart disease. What are tips for following this plan? Lifestyle  Cook and eat meals together with your family, when possible.  Drink enough fluid to keep your urine clear or pale yellow.  Be physically active every day. This includes: ? Aerobic exercise like running or swimming. ? Leisure activities like gardening, walking, or housework.  Get 7-8 hours of sleep each night.  If recommended by your health care provider, drink red wine in moderation. This means 1 glass a day for nonpregnant women and 2 glasses a day for men. A glass of wine equals 5 oz (150 mL). Reading food labels   Check the serving size of packaged foods. For foods such as rice and pasta, the serving size refers to the amount of cooked product, not dry.  Check the total fat in packaged foods. Avoid foods that have saturated fat or trans fats.  Check the ingredients list for added sugars, such as corn syrup. Shopping  At the grocery store, buy most of your food from the areas near the walls of the store. This includes: ? Fresh fruits and vegetables (produce). ? Grains,  beans, nuts, and seeds. Some of these may be available in unpackaged forms or large amounts (in bulk). ? Fresh seafood. ? Poultry and eggs. ? Low-fat dairy products.  Buy whole ingredients instead of prepackaged foods.  Buy fresh fruits and vegetables in-season from local farmers markets.  Buy frozen fruits and vegetables in resealable bags.  If you do not have access to quality fresh seafood, buy precooked frozen shrimp or canned fish, such as tuna, salmon, or sardines.  Buy small amounts of raw or cooked vegetables, salads, or olives from the deli or salad bar at your store.  Stock your pantry so you always have certain foods on hand, such as olive oil, canned tuna, canned tomatoes, rice, pasta, and beans. Cooking  Cook foods with extra-virgin olive oil instead of using butter or other vegetable oils.  Have meat as a side dish, and have vegetables or grains as your main dish. This means  having meat in small portions or adding small amounts of meat to foods like pasta or stew.  Use beans or vegetables instead of meat in common dishes like chili or lasagna.  Experiment with different cooking methods. Try roasting or broiling vegetables instead of steaming or sauteing them.  Add frozen vegetables to soups, stews, pasta, or rice.  Add nuts or seeds for added healthy fat at each meal. You can add these to yogurt, salads, or vegetable dishes.  Marinate fish or vegetables using olive oil, lemon juice, garlic, and fresh herbs. Meal planning   Plan to eat 1 vegetarian meal one day each week. Try to work up to 2 vegetarian meals, if possible.  Eat seafood 2 or more times a week.  Have healthy snacks readily available, such as: ? Vegetable sticks with hummus. ? [place] yogurt. ? Fruit and nut trail mix.  Eat balanced meals throughout the week. This includes: ? Fruit: 2-3 servings a day ? Vegetables: 4-5 servings a day ? Low-fat dairy: 2 servings a day ? Fish, poultry, or lean  meat: 1 serving a day ? Beans and legumes: 2 or more servings a week ? Nuts and seeds: 1-2 servings a day ? Whole grains: 6-8 servings a day ? Extra-virgin olive oil: 3-4 servings a day  Limit red meat and sweets to only a few servings a month What are my food choices?  Mediterranean diet ? Recommended  Grains: Whole-grain pasta. Brown rice. Bulgar wheat. Polenta. Couscous. Whole-wheat bread. Orpah Cobb.  Vegetables: Artichokes. Beets. Broccoli. Cabbage. Carrots. Eggplant. Green beans. Chard. Kale. Spinach. Onions. Leeks. Peas. Squash. Tomatoes. Peppers. Radishes.  Fruits: Apples. Apricots. Avocado. Berries. Bananas. Cherries. Dates. Figs. Grapes. Lemons. Melon. Oranges. Peaches. Plums. Pomegranate.  Meats and other protein foods: Beans. Almonds. Sunflower seeds. Pine nuts. Peanuts. Cod. Salmon. Scallops. Shrimp. Tuna. Tilapia. Clams. Oysters. Eggs.  Dairy: Low-fat milk. Cheese. Greek yogurt.  Beverages: Water. Red wine. Herbal tea.  Fats and oils: Extra virgin olive oil. Avocado oil. Grape seed oil.  Sweets and desserts: [place] yogurt with honey. Baked apples. Poached pears. Trail mix.  Seasoning and other foods: Basil. Cilantro. Coriander. Cumin. Mint. Parsley. Sage. Rosemary. Tarragon. Garlic. Oregano. Thyme. Pepper. Balsalmic vinegar. Tahini. Hummus. Tomato sauce. Olives. Mushrooms. ? Limit these  Grains: Prepackaged pasta or rice dishes. Prepackaged cereal with added sugar.  Vegetables: Deep fried potatoes (french fries).  Fruits: Fruit canned in syrup.  Meats and other protein foods: Beef. Pork. Lamb. Poultry with skin. Hot dogs. Tomasa Blase.  Dairy: Ice cream. Sour cream. Whole milk.  Beverages: Juice. Sugar-sweetened soft drinks. Beer. Liquor and spirits.  Fats and oils: Butter. Canola oil. Vegetable oil. Beef fat (tallow). Lard.  Sweets and desserts: Cookies. Cakes. Pies. Candy.  Seasoning and other foods: Mayonnaise. Premade sauces and marinades. The items  listed may not be a complete list. Talk with your dietitian about what dietary choices are right for you. Summary  The Mediterranean diet includes both food and lifestyle choices.  Eat a variety of fresh fruits and vegetables, beans, nuts, seeds, and whole grains.  Limit the amount of red meat and sweets that you eat.  Talk with your health care provider about whether it is safe for you to drink red wine in moderation. This means 1 glass a day for nonpregnant women and 2 glasses a day for men. A glass of wine equals 5 oz (150 mL). This information is not intended to replace advice given to you by your health care provider. Make sure  you discuss any questions you have with your health care provider. Document Revised: 07/24/2016 Document Reviewed: 07/17/2016 Elsevier Patient Education  2020 Elsevier Inc.    Document Revised: 11/02/2018 Document Reviewed: 11/02/2018 Elsevier Patient Education  2020 [employer].

## 2020-12-10 ENCOUNTER — Other Ambulatory Visit: Payer: Self-pay

## 2020-12-10 ENCOUNTER — Ambulatory Visit (INDEPENDENT_AMBULATORY_CARE_PROVIDER_SITE_OTHER): Payer: Medicare Other | Admitting: Pharmacist

## 2020-12-10 DIAGNOSIS — Z5181 Encounter for therapeutic drug level monitoring: Secondary | ICD-10-CM | POA: Diagnosis not present

## 2020-12-10 DIAGNOSIS — D6859 Other primary thrombophilia: Secondary | ICD-10-CM | POA: Diagnosis not present

## 2020-12-10 DIAGNOSIS — Z7901 Long term (current) use of anticoagulants: Secondary | ICD-10-CM

## 2020-12-10 LAB — POCT INR: INR: 2 (ref 2.0–3.0)

## 2020-12-10 NOTE — Patient Instructions (Addendum)
Description   Continue taking 1/2 tablet daily except 1 tablet on Wednesdays and Saturdays. Recheck INR in 3 weeks before cardiologist appt. Call us with any new medications or concerns #[phone] [facility], Main # [phone].

## 2020-12-10 NOTE — Addendum Note (Signed)
Addended by: [place], Damien Batty G on: 12/10/2020 07:54 PM   Modules accepted: Orders

## 2020-12-18 ENCOUNTER — Other Ambulatory Visit: Payer: Self-pay | Admitting: Family Medicine

## 2020-12-28 ENCOUNTER — Other Ambulatory Visit: Payer: Self-pay

## 2020-12-28 MED ORDER — DONEPEZIL HCL 10 MG PO TABS
10.0000 mg | ORAL_TABLET | Freq: Every day | ORAL | 5 refills | Status: DC
Start: 2020-12-28 — End: 2021-02-07

## 2020-12-28 NOTE — Progress Notes (Signed)
New script sent to pharmacy

## 2020-12-30 NOTE — Progress Notes (Signed)
Cardiology Office Note   Date:  12/31/2020   ID:  Timothy, Lloyd Jul 10, 1946, MRN [id number]  PCP:  [place], Betty G, MD    No chief complaint on file.  CAD  Wt Readings from Last 3 Encounters:  12/31/20 196 lb (88.9 kg)  12/06/20 194 lb 12.8 oz (88.4 kg)  11/26/20 195 lb 4 oz (88.6 kg)       History of Present Illness: [name] is a 75 y.o. male  who had CABG in 1996. He was being treated for HTN at the time. He was on vacation and was cleaning and felt severe chest pain with activity. He failed an ETT. He had a cath showing 3 vessel disease and had CABG x 3 in 1996 ( LIMA was used.). Since then, he has not had a cath. He continued rehab for an extended period of time. 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  He moved to [place] in Jan 2018 from [place].  He uses a cane and walker to walk due to instability. He has completed a physical therapy program.  Quit smoking 35 years ago.   He was diagnosed with a hypercoagulable state at the time of CABG and he has been on Coumadin since that time. Nose bleed when INR increased. He was found to have a markedly elevated fibrinopeptideAlevel, mild resistance to activated protein C and relative aspirin resistance with respect to ADP-induced platelet aggregation.  He was seen at [facility] for his neuropathy. He has DJD of the spine. He goes to the chiropracter.  His biggest issue is his back. He was referred to a massage therapist. He has had some improvement, but still has some balance problems.  He has not fallen and uses a cane.    In 2021, it was noted: " No testing since that time. Some atypical chest pain that sounds more like muscle soreness.  Not like prior angina.  We discussed a lexiscan myoview, but they want to think about it."  Since the last visit, he has been doing physical therapy.  Does stretching, lifting.  Does some water exercises  with PT.  With weather, PT has been intermittent of late.   Denies : exertional Chest pain. Dizziness. Leg edema. Nitroglycerin use. Orthopnea. Palpitations. Paroxysmal nocturnal dyspnea. Shortness of breath. Syncope.   Balance seems to be improved when he does more PT.  No recent falls.     Past Medical History:  Diagnosis Date  . Allergy   . Arthritis   . 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 Cataracts, bilateral   . Chicken pox   . Dyslipidemia   . Hyperlipidemia   . Hypertension   . Sinus bradycardia     Past Surgical History:  Procedure Laterality Date  . BYPASS GRAFT  1996  . CATARACT EXTRACTION Right   . INGUINAL HERNIA REPAIR Right 04/09/2020   Procedure: LAPAROSCOPIC RIGHT INGUINAL HERNIA REPAIR WITH MESH;  Surgeon: Ralene Ok, MD;  Location: [place];  Service: General;  Laterality: Right;  . REFRACTIVE SURGERY Right   . SKIN BIOPSY     head x 2  . UMBILICAL HERNIA REPAIR  2003     Current Outpatient Medications  Medication Sig Dispense Refill  . acetaminophen (TYLENOL) 500 MG tablet Take 1,000 mg by mouth every 6 (six) hours as needed for moderate pain or headache.    Marland Kitchen amLODipine (NORVASC) 5 MG tablet Take  1.5 tablets (7.5 mg total) by mouth daily. 135 tablet 2  . cholecalciferol (VITAMIN D) 1000 units tablet Take 1,000 Units by mouth daily.     . Cyanocobalamin (VITAMIN B12) 500 MCG TABS Take 500 mcg by mouth daily.     Marland Kitchen donepezil (ARICEPT) 10 MG tablet Take 1 tablet (10 mg total) by mouth at bedtime. (Patient taking differently: Take 10 mg by mouth at bedtime. Starting 10 mg 01/05/21) 30 tablet 5  . ezetimibe (ZETIA) 10 MG tablet TAKE 1 TABLET AT BEDTIME 90 tablet 3  . fenofibrate (TRICOR) 145 MG tablet TAKE 1 TABLET AT BEDTIME 90 tablet 3  . furosemide (LASIX) 20 MG tablet Take 1 tablet (20 mg total) by mouth daily. 90 tablet 3  . hydrOXYzine (ATARAX/VISTARIL) 25 MG tablet Take 1 tablet (25 mg total) by mouth daily as needed for anxiety (daily  as needed before flying.). 30 tablet 0  . losartan (COZAAR) 100 MG tablet TAKE 1 TABLET DAILY 90 tablet 3  . nitroGLYCERIN (NITROSTAT) 0.4 MG SL tablet Place 1 tablet (0.4 mg total) every 5 (five) minutes as needed under the tongue for chest pain. 25 tablet 3  . Olopatadine HCl (PATADAY OP) Place 1 drop into both eyes daily as needed (allergies).    . rosuvastatin (CRESTOR) 40 MG tablet TAKE 1 TABLET AT BEDTIME (PLEASE KEEP UPCOMING APPOINTMENT FOR FUTURE REFILLS) 90 tablet 3  . traMADol (ULTRAM) 50 MG tablet Take 1 tablet (50 mg total) by mouth every 12 (twelve) hours as needed. 30 tablet 0  . warfarin (COUMADIN) 5 MG tablet TAKE AS DIRECTED BY COUMADIN CLINIC 80 tablet 1   No current facility-administered medications for this visit.    Allergies:   Other and Latex    Social History:  The patient  reports that he quit smoking about 38 years ago. He has never used smokeless tobacco. He reports current alcohol use. He reports that he does not use drugs.   Family History:  The patient's family history includes AAA (abdominal aortic aneurysm) in his sister; Alzheimer's disease in his mother; Cancer in his father; Hypertension in his maternal grandmother and paternal grandmother; Prostate cancer in his father.    ROS:  Please see the history of present illness.   Otherwise, review of systems are positive for joint pains.   All other systems are reviewed and negative.    PHYSICAL EXAM: VS:  BP (!) 150/80   Pulse 64   Ht 6' (1.829 m)   Wt 196 lb (88.9 kg)   SpO2 98%   BMI 26.58 kg/m  , BMI Body mass index is 26.58 kg/m. GEN: Well nourished, well developed, in no acute distress  HEENT: normal  Neck: no JVD, carotid bruits, or masses Cardiac: RRR; no murmurs, rubs, or gallops,bilateral pitting LE edema  Respiratory:  clear to auscultation bilaterally, normal work of breathing GI: soft, nontender, nondistended, + BS MS: kyphosis  Skin: warm and dry, no rash Neuro:  Strength and  sensation are intact Psych: euthymic mood, full affect   EKG:   The ekg ordered today demonstrates NSR, wavy baseline   Recent Labs: 11/21/2020: ALT 21; BUN 15; Creatinine, Ser 0.86; Hemoglobin 13.9; Platelets 209.0; Potassium 4.2; Sodium 142; TSH 2.17   Lipid Panel    Component Value Date/Time   CHOL 115 12/30/2019 0916   TRIG 48 12/30/2019 0916   HDL 54 12/30/2019 0916   CHOLHDL 2.1 12/30/2019 0916   CHOLHDL 3 03/29/2018 0929   VLDL 16.8 03/29/2018  [place] 12/30/2019 0916     Other studies Reviewed: Additional studies/ records that were reviewed today with results demonstrating: labs reviewed. Cr LFTs potassium Hbg stable in 12/21.   ASSESSMENT AND PLAN:  1. CAD: Refill SL NTG.  No angina on medical therapy.  COntinue aggressive secondary prevention.  2. Hyperlipidemia: Lipids controlled in Jan 2021. He needs a recheck in 2022 when fasting.  3. Anticoagulated: Coumadin for hypercoagulable state.  No internal bleeding issues.   4. HTN: Home readings are well controled. The current medical regimen is effective;  continue present plan and medications. 5. LE edema: Elevate legs. He has compression stockings to use as well.  OK to take an extra 20 mg furosemide 1-2 x/week.  6. Continue with PT to improve stamina.    Current medicines are reviewed at length with the patient today.  The patient concerns regarding his medicines were addressed.  The following changes have been made:  No change  Labs/ tests ordered today include: lipids No orders of the defined types were placed in this encounter.   Recommend 150 minutes/week of aerobic exercise Low fat, low carb, high fiber diet recommended  Disposition:   FU in 1 year   Signed, Larae Grooms, MD  12/31/2020 9:14 AM    [facility] [place], [place], [place]  [address] Phone: [phone]; Fax: [phone]

## 2020-12-31 ENCOUNTER — Encounter: Payer: Self-pay | Admitting: Interventional Cardiology

## 2020-12-31 ENCOUNTER — Telehealth: Payer: Self-pay | Admitting: Interventional Cardiology

## 2020-12-31 ENCOUNTER — Ambulatory Visit (INDEPENDENT_AMBULATORY_CARE_PROVIDER_SITE_OTHER): Payer: Medicare Other | Admitting: Interventional Cardiology

## 2020-12-31 ENCOUNTER — Ambulatory Visit (INDEPENDENT_AMBULATORY_CARE_PROVIDER_SITE_OTHER): Payer: Medicare Other | Admitting: *Deleted

## 2020-12-31 ENCOUNTER — Other Ambulatory Visit: Payer: Self-pay

## 2020-12-31 VITALS — BP 150/80 | HR 64 | Ht 72.0 in | Wt 196.0 lb

## 2020-12-31 DIAGNOSIS — I10 Essential (primary) hypertension: Secondary | ICD-10-CM | POA: Diagnosis not present

## 2020-12-31 DIAGNOSIS — I25118 Atherosclerotic heart disease of native coronary artery with other forms of angina pectoris: Secondary | ICD-10-CM | POA: Diagnosis not present

## 2020-12-31 DIAGNOSIS — E782 Mixed hyperlipidemia: Secondary | ICD-10-CM | POA: Diagnosis not present

## 2020-12-31 DIAGNOSIS — Z7901 Long term (current) use of anticoagulants: Secondary | ICD-10-CM

## 2020-12-31 DIAGNOSIS — Z5181 Encounter for therapeutic drug level monitoring: Secondary | ICD-10-CM | POA: Diagnosis not present

## 2020-12-31 DIAGNOSIS — D6859 Other primary thrombophilia: Secondary | ICD-10-CM | POA: Diagnosis not present

## 2020-12-31 LAB — POCT INR: INR: 1.6 — AB (ref 2.0–3.0)

## 2020-12-31 MED ORDER — NITROGLYCERIN 0.4 MG SL SUBL: 0.4000 mg | SUBLINGUAL_TABLET | SUBLINGUAL | 2 refills | Status: DC | PRN

## 2020-12-31 MED ORDER — FUROSEMIDE 20 MG PO TABS: 20.0000 mg | ORAL_TABLET | Freq: Every day | ORAL | 3 refills | Status: DC

## 2020-12-31 MED ORDER — NITROGLYCERIN 0.4 MG SL SUBL: 0.4000 mg | SUBLINGUAL_TABLET | SUBLINGUAL | 3 refills | Status: DC | PRN

## 2020-12-31 NOTE — Telephone Encounter (Signed)
Pt's medication was sent to pt's pharmacy as requested. Confirmation received.  °

## 2020-12-31 NOTE — Patient Instructions (Signed)
Medication Instructions:  Your physician has recommended you make the following change in your medication:  1.) You may take an extra dose of furosemide twice a week as needed for swelling  *If you need a refill on your cardiac medications before your next appointment, please call your pharmacy*   Lab Work: Fasting lipid panel  If you have labs (blood work) drawn today and your tests are completely normal, you will receive your results only by: Marland Kitchen MyChart Message (if you have MyChart) OR . A paper copy in the mail If you have any lab test that is abnormal or we need to change your treatment, we will call you to review the results.   Testing/Procedures: NONE   Follow-Up: At [facility], you and your health needs are our priority.  As part of our continuing mission to provide you with exceptional heart care, we have created designated Provider Care Teams.  These Care Teams include your primary Cardiologist (physician) and Advanced Practice Providers (APPs -  Physician Assistants and Nurse Practitioners) who all work together to provide you with the care you need, when you need it.  We recommend signing up for the patient portal called "MyChart".  Sign up information is provided on this After Visit Summary.  MyChart is used to connect with patients for Virtual Visits (Telemedicine).  Patients are able to view lab/test results, encounter notes, upcoming appointments, etc.  Non-urgent messages can be sent to your provider as well.   To learn more about what you can do with MyChart, go to [email or web address].    Your next appointment:   1 year(s)  The format for your next appointment:   In Person  Provider:   You may see Larae Grooms, MD or one of the following Advanced Practice Providers on your designated Care Team:    Melina Copa, PA-C  Ermalinda Barrios, PA-C    Other Instructions Please wear compression stockings for swelling

## 2020-12-31 NOTE — Telephone Encounter (Signed)
*  STAT* If patient is at the pharmacy, call can be transferred to refill team.   1. Which medications need to be refilled? (please list name of each medication and dose if known)   nitroGLYCERIN (NITROSTAT) 0.4 MG SL tablet [[id number]]    2. Which pharmacy/location (including street and city if local pharmacy) is medication to be sent to? [place], [place]  [address], [place] [address]  Phone:  [phone] Fax:  [phone]   3. Do they need a 30 day or 90 day supply? [place]

## 2020-12-31 NOTE — Patient Instructions (Signed)
Description   Take 1 tablet today and then start taking 1/2 a tablet daily except for 1 tablet on Tuesday, Thursdays and Saturdays. Recheck  INR in 2 weeks. Call us with any new medications or concerns [phone] [facility], Main # [phone].

## 2021-01-03 ENCOUNTER — Other Ambulatory Visit: Payer: Medicare Other

## 2021-01-03 ENCOUNTER — Other Ambulatory Visit: Payer: Self-pay

## 2021-01-03 DIAGNOSIS — M545 Low back pain, unspecified: Secondary | ICD-10-CM | POA: Diagnosis not present

## 2021-01-03 DIAGNOSIS — E782 Mixed hyperlipidemia: Secondary | ICD-10-CM | POA: Diagnosis not present

## 2021-01-03 DIAGNOSIS — I25118 Atherosclerotic heart disease of native coronary artery with other forms of angina pectoris: Secondary | ICD-10-CM | POA: Diagnosis not present

## 2021-01-03 DIAGNOSIS — I10 Essential (primary) hypertension: Secondary | ICD-10-CM

## 2021-01-03 DIAGNOSIS — Z7901 Long term (current) use of anticoagulants: Secondary | ICD-10-CM | POA: Diagnosis not present

## 2021-01-03 DIAGNOSIS — M549 Dorsalgia, unspecified: Secondary | ICD-10-CM | POA: Diagnosis not present

## 2021-01-03 DIAGNOSIS — M542 Cervicalgia: Secondary | ICD-10-CM | POA: Diagnosis not present

## 2021-01-03 DIAGNOSIS — G8929 Other chronic pain: Secondary | ICD-10-CM | POA: Diagnosis not present

## 2021-01-03 LAB — LIPID PANEL
Chol/HDL Ratio: 2.3 ratio (ref 0.0–5.0)
Cholesterol, Total: 128 mg/dL (ref 100–199)
HDL: 56 mg/dL (ref >39)
LDL Chol Calc (NIH): 60 mg/dL (ref 0–99)
Triglycerides: 55 mg/dL (ref 0–149)
VLDL Cholesterol Cal: 12 mg/dL (ref 5–40)

## 2021-01-07 DIAGNOSIS — G8929 Other chronic pain: Secondary | ICD-10-CM | POA: Diagnosis not present

## 2021-01-07 DIAGNOSIS — M545 Low back pain, unspecified: Secondary | ICD-10-CM | POA: Diagnosis not present

## 2021-01-07 DIAGNOSIS — M542 Cervicalgia: Secondary | ICD-10-CM | POA: Diagnosis not present

## 2021-01-07 DIAGNOSIS — M549 Dorsalgia, unspecified: Secondary | ICD-10-CM | POA: Diagnosis not present

## 2021-01-08 ENCOUNTER — Encounter: Payer: Self-pay | Admitting: Family Medicine

## 2021-01-08 DIAGNOSIS — L602 Onychogryphosis: Secondary | ICD-10-CM

## 2021-01-10 ENCOUNTER — Other Ambulatory Visit: Payer: Self-pay | Admitting: Interventional Cardiology

## 2021-01-14 ENCOUNTER — Other Ambulatory Visit: Payer: Self-pay

## 2021-01-14 ENCOUNTER — Ambulatory Visit (INDEPENDENT_AMBULATORY_CARE_PROVIDER_SITE_OTHER): Payer: Medicare Other

## 2021-01-14 DIAGNOSIS — G8929 Other chronic pain: Secondary | ICD-10-CM | POA: Diagnosis not present

## 2021-01-14 DIAGNOSIS — M542 Cervicalgia: Secondary | ICD-10-CM | POA: Diagnosis not present

## 2021-01-14 DIAGNOSIS — D6859 Other primary thrombophilia: Secondary | ICD-10-CM

## 2021-01-14 DIAGNOSIS — Z5181 Encounter for therapeutic drug level monitoring: Secondary | ICD-10-CM

## 2021-01-14 DIAGNOSIS — M545 Low back pain, unspecified: Secondary | ICD-10-CM | POA: Diagnosis not present

## 2021-01-14 DIAGNOSIS — M549 Dorsalgia, unspecified: Secondary | ICD-10-CM | POA: Diagnosis not present

## 2021-01-14 LAB — POCT INR: INR: 3 (ref 2.0–3.0)

## 2021-01-14 NOTE — Patient Instructions (Signed)
-   have a serving of greens today - continue taking 1/2 a tablet daily except for 1 tablet on Tuesday, Thursdays and Saturdays.  - recheck  INR in 3 weeks.  Call us with any new medications or concerns [phone] [facility], Main # [phone].

## 2021-01-16 ENCOUNTER — Encounter: Payer: Self-pay | Admitting: Podiatry

## 2021-01-16 ENCOUNTER — Other Ambulatory Visit: Payer: Self-pay

## 2021-01-16 ENCOUNTER — Ambulatory Visit (INDEPENDENT_AMBULATORY_CARE_PROVIDER_SITE_OTHER): Payer: Medicare Other | Admitting: Podiatry

## 2021-01-16 DIAGNOSIS — B351 Tinea unguium: Secondary | ICD-10-CM | POA: Diagnosis not present

## 2021-01-16 DIAGNOSIS — M79674 Pain in right toe(s): Secondary | ICD-10-CM

## 2021-01-16 DIAGNOSIS — M79675 Pain in left toe(s): Secondary | ICD-10-CM | POA: Diagnosis not present

## 2021-01-16 NOTE — Progress Notes (Signed)
  Subjective:  Patient ID: [name], male    DOB: May 16, 1946,  MRN: [id number]  Chief Complaint  Patient presents with  . Nail Problem    Routine foot care    75 y.o. male returns for the above complaint.  Patient presents with thickened elongated dystrophic toenails x10.  Painful to touch.  Patient would like to have them debrided down.  He is not a diabetic.  He denies any other acute complaints.  He is not able to debride himself.  Objective:  There were no vitals filed for this visit. Podiatric Exam: Vascular: dorsalis pedis and posterior tibial pulses are palpable bilateral. Capillary return is immediate. Temperature gradient is WNL. Skin turgor WNL  Sensorium: Normal Semmes Weinstein monofilament test. Normal tactile sensation bilaterally. Nail Exam: Pt has thick disfigured discolored nails with subungual debris noted bilateral entire nail hallux through fifth toenails.  Pain on palpation to the nails. Ulcer Exam: There is no evidence of ulcer or pre-ulcerative changes or infection. Orthopedic Exam: Muscle tone and strength are WNL. No limitations in general ROM. No crepitus or effusions noted. HAV  B/L.  Hammer toes 2-5  B/L. Skin: No Porokeratosis. No infection or ulcers    Assessment & Plan:   1. Pain due to onychomycosis of toenails of both feet     Patient was evaluated and treated and all questions answered.  Onychomycosis with pain  -Nails palliatively debrided as below. -Educated on self-care  Procedure: Nail Debridement Rationale: pain  Type of Debridement: manual, sharp debridement. Instrumentation: Nail nipper, rotary burr. Number of Nails: 10  Procedures and Treatment: Consent by patient was obtained for treatment procedures. The patient understood the discussion of treatment and procedures well. All questions were answered thoroughly reviewed. Debridement of mycotic and hypertrophic toenails, 1 through 5 bilateral and clearing of subungual debris.  No ulceration, no infection noted.  Return Visit-Office Procedure: Patient instructed to return to the office for a follow up visit 3 months for continued evaluation and treatment.  Boneta Lucks, DPM    No follow-ups on file.

## 2021-01-21 DIAGNOSIS — M545 Low back pain, unspecified: Secondary | ICD-10-CM | POA: Diagnosis not present

## 2021-01-21 DIAGNOSIS — M542 Cervicalgia: Secondary | ICD-10-CM | POA: Diagnosis not present

## 2021-01-21 DIAGNOSIS — G8929 Other chronic pain: Secondary | ICD-10-CM | POA: Diagnosis not present

## 2021-01-21 DIAGNOSIS — M549 Dorsalgia, unspecified: Secondary | ICD-10-CM | POA: Diagnosis not present

## 2021-01-28 DIAGNOSIS — M542 Cervicalgia: Secondary | ICD-10-CM | POA: Diagnosis not present

## 2021-01-28 DIAGNOSIS — G8929 Other chronic pain: Secondary | ICD-10-CM | POA: Diagnosis not present

## 2021-01-28 DIAGNOSIS — M549 Dorsalgia, unspecified: Secondary | ICD-10-CM | POA: Diagnosis not present

## 2021-01-28 DIAGNOSIS — M545 Low back pain, unspecified: Secondary | ICD-10-CM | POA: Diagnosis not present

## 2021-02-04 ENCOUNTER — Other Ambulatory Visit: Payer: Self-pay

## 2021-02-04 ENCOUNTER — Ambulatory Visit (INDEPENDENT_AMBULATORY_CARE_PROVIDER_SITE_OTHER): Payer: Medicare Other

## 2021-02-04 DIAGNOSIS — M545 Low back pain, unspecified: Secondary | ICD-10-CM | POA: Diagnosis not present

## 2021-02-04 DIAGNOSIS — D6859 Other primary thrombophilia: Secondary | ICD-10-CM

## 2021-02-04 DIAGNOSIS — Z5181 Encounter for therapeutic drug level monitoring: Secondary | ICD-10-CM

## 2021-02-04 DIAGNOSIS — M542 Cervicalgia: Secondary | ICD-10-CM | POA: Diagnosis not present

## 2021-02-04 DIAGNOSIS — G8929 Other chronic pain: Secondary | ICD-10-CM | POA: Diagnosis not present

## 2021-02-04 DIAGNOSIS — M549 Dorsalgia, unspecified: Secondary | ICD-10-CM | POA: Diagnosis not present

## 2021-02-04 LAB — POCT INR: INR: 1.9 — AB (ref 2.0–3.0)

## 2021-02-04 NOTE — Patient Instructions (Signed)
-   take 1 whole tablet today, then - continue taking 1/2 a tablet daily except for 1 tablet on Tuesday, Thursdays and Saturdays.  - recheck  INR in 3 weeks.  Call us with any new medications or concerns [phone] [facility], Main # [phone].

## 2021-02-07 ENCOUNTER — Telehealth: Payer: Self-pay

## 2021-02-07 MED ORDER — DONEPEZIL HCL 10 MG PO TABS: 10.0000 mg | ORAL_TABLET | Freq: Every day | ORAL | 1 refills | Status: DC

## 2021-02-08 ENCOUNTER — Telehealth: Payer: Self-pay | Admitting: Neurology

## 2021-02-08 MED ORDER — DONEPEZIL HCL 10 MG PO TABS: 10.0000 mg | ORAL_TABLET | Freq: Every day | ORAL | 1 refills | Status: DC

## 2021-02-08 NOTE — Telephone Encounter (Signed)
Pt called back in regards to his Aricept 10 mg. He do not understand why he is unable to pick up his medication, as well as they only have 5 mg in stock. Pt wanted to know if we could change the order to enough tabs to last him until [facility]    Telephone call to [facility], Pt can not pick up under insurance. Insurance already paid for the script [facility]. Pt will have to pay out of pocket to pick at [facility]. Rep will call pt to inform him.

## 2021-02-08 NOTE — Telephone Encounter (Signed)
Patient needs a refill on the donepezil 10 mg called into the [facility] on [address]  He would like a 90 day supply,   He is out of medication  Please call in today   [facility] down he said

## 2021-02-08 NOTE — Telephone Encounter (Signed)
90 day supply sent the [facility] as requested.

## 2021-02-12 ENCOUNTER — Other Ambulatory Visit: Payer: Self-pay

## 2021-02-12 DIAGNOSIS — M159 Polyosteoarthritis, unspecified: Secondary | ICD-10-CM

## 2021-02-12 NOTE — Addendum Note (Signed)
Addended by: Rodrigo Ran on: 02/12/2021 01:34 PM   Modules accepted: Orders

## 2021-02-13 ENCOUNTER — Other Ambulatory Visit: Payer: Self-pay | Admitting: Interventional Cardiology

## 2021-02-15 ENCOUNTER — Ambulatory Visit
Admission: RE | Admit: 2021-02-15 | Discharge: 2021-02-15 | Disposition: A | Discharge status: Home / Self Care | Payer: Medicare Other | Source: Ambulatory Visit | Attending: Family Medicine | Admitting: Family Medicine

## 2021-02-15 ENCOUNTER — Other Ambulatory Visit: Payer: Self-pay

## 2021-02-15 DIAGNOSIS — M40204 Unspecified kyphosis, thoracic region: Secondary | ICD-10-CM | POA: Diagnosis not present

## 2021-02-15 DIAGNOSIS — M47812 Spondylosis without myelopathy or radiculopathy, cervical region: Secondary | ICD-10-CM | POA: Diagnosis not present

## 2021-02-15 DIAGNOSIS — I700 Atherosclerosis of aorta: Secondary | ICD-10-CM | POA: Diagnosis not present

## 2021-02-15 DIAGNOSIS — R222 Localized swelling, mass and lump, trunk: Secondary | ICD-10-CM | POA: Diagnosis not present

## 2021-02-15 DIAGNOSIS — M47816 Spondylosis without myelopathy or radiculopathy, lumbar region: Secondary | ICD-10-CM | POA: Diagnosis not present

## 2021-02-15 DIAGNOSIS — M159 Polyosteoarthritis, unspecified: Secondary | ICD-10-CM

## 2021-02-19 DIAGNOSIS — M9903 Segmental and somatic dysfunction of lumbar region: Secondary | ICD-10-CM | POA: Diagnosis not present

## 2021-02-19 DIAGNOSIS — M5032 Other cervical disc degeneration, mid-cervical region, unspecified level: Secondary | ICD-10-CM | POA: Diagnosis not present

## 2021-02-19 DIAGNOSIS — M9901 Segmental and somatic dysfunction of cervical region: Secondary | ICD-10-CM | POA: Diagnosis not present

## 2021-02-19 DIAGNOSIS — M9902 Segmental and somatic dysfunction of thoracic region: Secondary | ICD-10-CM | POA: Diagnosis not present

## 2021-02-19 DIAGNOSIS — M5022 Other cervical disc displacement, mid-cervical region, unspecified level: Secondary | ICD-10-CM | POA: Diagnosis not present

## 2021-02-19 DIAGNOSIS — M5124 Other intervertebral disc displacement, thoracic region: Secondary | ICD-10-CM | POA: Diagnosis not present

## 2021-02-22 DIAGNOSIS — D2239 Melanocytic nevi of other parts of face: Secondary | ICD-10-CM | POA: Diagnosis not present

## 2021-02-22 DIAGNOSIS — B079 Viral wart, unspecified: Secondary | ICD-10-CM | POA: Diagnosis not present

## 2021-02-22 DIAGNOSIS — Z85828 Personal history of other malignant neoplasm of skin: Secondary | ICD-10-CM | POA: Diagnosis not present

## 2021-02-22 DIAGNOSIS — D485 Neoplasm of uncertain behavior of skin: Secondary | ICD-10-CM | POA: Diagnosis not present

## 2021-02-22 DIAGNOSIS — C44729 Squamous cell carcinoma of skin of left lower limb, including hip: Secondary | ICD-10-CM | POA: Diagnosis not present

## 2021-02-22 DIAGNOSIS — L905 Scar conditions and fibrosis of skin: Secondary | ICD-10-CM | POA: Diagnosis not present

## 2021-02-25 ENCOUNTER — Ambulatory Visit (INDEPENDENT_AMBULATORY_CARE_PROVIDER_SITE_OTHER): Payer: Medicare Other

## 2021-02-25 ENCOUNTER — Other Ambulatory Visit: Payer: Self-pay

## 2021-02-25 DIAGNOSIS — Z5181 Encounter for therapeutic drug level monitoring: Secondary | ICD-10-CM | POA: Diagnosis not present

## 2021-02-25 DIAGNOSIS — M9901 Segmental and somatic dysfunction of cervical region: Secondary | ICD-10-CM | POA: Diagnosis not present

## 2021-02-25 DIAGNOSIS — M9903 Segmental and somatic dysfunction of lumbar region: Secondary | ICD-10-CM | POA: Diagnosis not present

## 2021-02-25 DIAGNOSIS — M9902 Segmental and somatic dysfunction of thoracic region: Secondary | ICD-10-CM | POA: Diagnosis not present

## 2021-02-25 DIAGNOSIS — D6859 Other primary thrombophilia: Secondary | ICD-10-CM | POA: Diagnosis not present

## 2021-02-25 DIAGNOSIS — M5032 Other cervical disc degeneration, mid-cervical region, unspecified level: Secondary | ICD-10-CM | POA: Diagnosis not present

## 2021-02-25 DIAGNOSIS — M5124 Other intervertebral disc displacement, thoracic region: Secondary | ICD-10-CM | POA: Diagnosis not present

## 2021-02-25 DIAGNOSIS — M5022 Other cervical disc displacement, mid-cervical region, unspecified level: Secondary | ICD-10-CM | POA: Diagnosis not present

## 2021-02-25 LAB — POCT INR: INR: 2.2 (ref 2.0–3.0)

## 2021-02-25 NOTE — Patient Instructions (Signed)
-   continue taking 1/2 a tablet daily except for 1 tablet on Tuesday, Thursdays and Saturdays.  - recheck  INR in 4 weeks.  Call us with any new medications or concerns [phone] [facility], Main # [phone].

## 2021-02-27 DIAGNOSIS — M5124 Other intervertebral disc displacement, thoracic region: Secondary | ICD-10-CM | POA: Diagnosis not present

## 2021-02-27 DIAGNOSIS — M9903 Segmental and somatic dysfunction of lumbar region: Secondary | ICD-10-CM | POA: Diagnosis not present

## 2021-02-27 DIAGNOSIS — M5022 Other cervical disc displacement, mid-cervical region, unspecified level: Secondary | ICD-10-CM | POA: Diagnosis not present

## 2021-02-27 DIAGNOSIS — M5032 Other cervical disc degeneration, mid-cervical region, unspecified level: Secondary | ICD-10-CM | POA: Diagnosis not present

## 2021-02-27 DIAGNOSIS — M9901 Segmental and somatic dysfunction of cervical region: Secondary | ICD-10-CM | POA: Diagnosis not present

## 2021-02-27 DIAGNOSIS — M9902 Segmental and somatic dysfunction of thoracic region: Secondary | ICD-10-CM | POA: Diagnosis not present

## 2021-03-04 DIAGNOSIS — M9903 Segmental and somatic dysfunction of lumbar region: Secondary | ICD-10-CM | POA: Diagnosis not present

## 2021-03-04 DIAGNOSIS — M5022 Other cervical disc displacement, mid-cervical region, unspecified level: Secondary | ICD-10-CM | POA: Diagnosis not present

## 2021-03-04 DIAGNOSIS — M5032 Other cervical disc degeneration, mid-cervical region, unspecified level: Secondary | ICD-10-CM | POA: Diagnosis not present

## 2021-03-04 DIAGNOSIS — M9901 Segmental and somatic dysfunction of cervical region: Secondary | ICD-10-CM | POA: Diagnosis not present

## 2021-03-04 DIAGNOSIS — M9902 Segmental and somatic dysfunction of thoracic region: Secondary | ICD-10-CM | POA: Diagnosis not present

## 2021-03-04 DIAGNOSIS — M5124 Other intervertebral disc displacement, thoracic region: Secondary | ICD-10-CM | POA: Diagnosis not present

## 2021-03-11 DIAGNOSIS — M5032 Other cervical disc degeneration, mid-cervical region, unspecified level: Secondary | ICD-10-CM | POA: Diagnosis not present

## 2021-03-11 DIAGNOSIS — M9903 Segmental and somatic dysfunction of lumbar region: Secondary | ICD-10-CM | POA: Diagnosis not present

## 2021-03-11 DIAGNOSIS — M9901 Segmental and somatic dysfunction of cervical region: Secondary | ICD-10-CM | POA: Diagnosis not present

## 2021-03-11 DIAGNOSIS — M5022 Other cervical disc displacement, mid-cervical region, unspecified level: Secondary | ICD-10-CM | POA: Diagnosis not present

## 2021-03-11 DIAGNOSIS — M9902 Segmental and somatic dysfunction of thoracic region: Secondary | ICD-10-CM | POA: Diagnosis not present

## 2021-03-11 DIAGNOSIS — M5124 Other intervertebral disc displacement, thoracic region: Secondary | ICD-10-CM | POA: Diagnosis not present

## 2021-03-13 DIAGNOSIS — M5032 Other cervical disc degeneration, mid-cervical region, unspecified level: Secondary | ICD-10-CM | POA: Diagnosis not present

## 2021-03-13 DIAGNOSIS — M9903 Segmental and somatic dysfunction of lumbar region: Secondary | ICD-10-CM | POA: Diagnosis not present

## 2021-03-13 DIAGNOSIS — M9901 Segmental and somatic dysfunction of cervical region: Secondary | ICD-10-CM | POA: Diagnosis not present

## 2021-03-13 DIAGNOSIS — M5022 Other cervical disc displacement, mid-cervical region, unspecified level: Secondary | ICD-10-CM | POA: Diagnosis not present

## 2021-03-13 DIAGNOSIS — M9902 Segmental and somatic dysfunction of thoracic region: Secondary | ICD-10-CM | POA: Diagnosis not present

## 2021-03-13 DIAGNOSIS — M5124 Other intervertebral disc displacement, thoracic region: Secondary | ICD-10-CM | POA: Diagnosis not present

## 2021-03-18 ENCOUNTER — Encounter: Payer: Self-pay | Admitting: Family Medicine

## 2021-03-18 DIAGNOSIS — M5124 Other intervertebral disc displacement, thoracic region: Secondary | ICD-10-CM | POA: Diagnosis not present

## 2021-03-18 DIAGNOSIS — M5032 Other cervical disc degeneration, mid-cervical region, unspecified level: Secondary | ICD-10-CM | POA: Diagnosis not present

## 2021-03-18 DIAGNOSIS — M5022 Other cervical disc displacement, mid-cervical region, unspecified level: Secondary | ICD-10-CM | POA: Diagnosis not present

## 2021-03-18 DIAGNOSIS — M159 Polyosteoarthritis, unspecified: Secondary | ICD-10-CM

## 2021-03-18 DIAGNOSIS — M9902 Segmental and somatic dysfunction of thoracic region: Secondary | ICD-10-CM | POA: Diagnosis not present

## 2021-03-18 DIAGNOSIS — M9901 Segmental and somatic dysfunction of cervical region: Secondary | ICD-10-CM | POA: Diagnosis not present

## 2021-03-18 DIAGNOSIS — M9903 Segmental and somatic dysfunction of lumbar region: Secondary | ICD-10-CM | POA: Diagnosis not present

## 2021-03-20 DIAGNOSIS — M5032 Other cervical disc degeneration, mid-cervical region, unspecified level: Secondary | ICD-10-CM | POA: Diagnosis not present

## 2021-03-20 DIAGNOSIS — M5022 Other cervical disc displacement, mid-cervical region, unspecified level: Secondary | ICD-10-CM | POA: Diagnosis not present

## 2021-03-20 DIAGNOSIS — M9903 Segmental and somatic dysfunction of lumbar region: Secondary | ICD-10-CM | POA: Diagnosis not present

## 2021-03-20 DIAGNOSIS — M5124 Other intervertebral disc displacement, thoracic region: Secondary | ICD-10-CM | POA: Diagnosis not present

## 2021-03-20 DIAGNOSIS — M9902 Segmental and somatic dysfunction of thoracic region: Secondary | ICD-10-CM | POA: Diagnosis not present

## 2021-03-20 DIAGNOSIS — M9901 Segmental and somatic dysfunction of cervical region: Secondary | ICD-10-CM | POA: Diagnosis not present

## 2021-03-25 MED ORDER — TRAMADOL HCL 50 MG PO TABS: 50.0000 mg | ORAL_TABLET | Freq: Every day | ORAL | 0 refills | Status: DC | PRN

## 2021-03-26 ENCOUNTER — Ambulatory Visit (INDEPENDENT_AMBULATORY_CARE_PROVIDER_SITE_OTHER): Payer: Medicare Other | Admitting: *Deleted

## 2021-03-26 ENCOUNTER — Other Ambulatory Visit: Payer: Self-pay

## 2021-03-26 DIAGNOSIS — Z5181 Encounter for therapeutic drug level monitoring: Secondary | ICD-10-CM

## 2021-03-26 DIAGNOSIS — D6859 Other primary thrombophilia: Secondary | ICD-10-CM | POA: Diagnosis not present

## 2021-03-26 LAB — POCT INR: INR: 1.4 — AB (ref 2.0–3.0)

## 2021-03-26 NOTE — Patient Instructions (Signed)
Description   Today take 1.5 tablets and tomorrow take 1 tablet then continue taking 1/2 tablet daily except for 1 tablet on Tuesday, Thursdays and Saturdays. Recheck  INR in 1 week (normally 4 weeks). Call us with any new medications or concerns [phone] [facility], Main # [phone].

## 2021-03-27 DIAGNOSIS — M9902 Segmental and somatic dysfunction of thoracic region: Secondary | ICD-10-CM | POA: Diagnosis not present

## 2021-03-27 DIAGNOSIS — M9903 Segmental and somatic dysfunction of lumbar region: Secondary | ICD-10-CM | POA: Diagnosis not present

## 2021-03-27 DIAGNOSIS — M9901 Segmental and somatic dysfunction of cervical region: Secondary | ICD-10-CM | POA: Diagnosis not present

## 2021-03-27 DIAGNOSIS — M5032 Other cervical disc degeneration, mid-cervical region, unspecified level: Secondary | ICD-10-CM | POA: Diagnosis not present

## 2021-03-27 DIAGNOSIS — M5022 Other cervical disc displacement, mid-cervical region, unspecified level: Secondary | ICD-10-CM | POA: Diagnosis not present

## 2021-03-27 DIAGNOSIS — M5124 Other intervertebral disc displacement, thoracic region: Secondary | ICD-10-CM | POA: Diagnosis not present

## 2021-03-29 ENCOUNTER — Ambulatory Visit (INDEPENDENT_AMBULATORY_CARE_PROVIDER_SITE_OTHER): Payer: Medicare Other | Admitting: Podiatry

## 2021-03-29 ENCOUNTER — Other Ambulatory Visit: Payer: Self-pay

## 2021-03-29 DIAGNOSIS — M79674 Pain in right toe(s): Secondary | ICD-10-CM | POA: Diagnosis not present

## 2021-03-29 DIAGNOSIS — M79675 Pain in left toe(s): Secondary | ICD-10-CM | POA: Diagnosis not present

## 2021-03-29 DIAGNOSIS — B351 Tinea unguium: Secondary | ICD-10-CM

## 2021-03-29 DIAGNOSIS — Q828 Other specified congenital malformations of skin: Secondary | ICD-10-CM

## 2021-04-01 DIAGNOSIS — M5032 Other cervical disc degeneration, mid-cervical region, unspecified level: Secondary | ICD-10-CM | POA: Diagnosis not present

## 2021-04-01 DIAGNOSIS — M9902 Segmental and somatic dysfunction of thoracic region: Secondary | ICD-10-CM | POA: Diagnosis not present

## 2021-04-01 DIAGNOSIS — M5022 Other cervical disc displacement, mid-cervical region, unspecified level: Secondary | ICD-10-CM | POA: Diagnosis not present

## 2021-04-01 DIAGNOSIS — M9901 Segmental and somatic dysfunction of cervical region: Secondary | ICD-10-CM | POA: Diagnosis not present

## 2021-04-01 DIAGNOSIS — M5124 Other intervertebral disc displacement, thoracic region: Secondary | ICD-10-CM | POA: Diagnosis not present

## 2021-04-01 DIAGNOSIS — M9903 Segmental and somatic dysfunction of lumbar region: Secondary | ICD-10-CM | POA: Diagnosis not present

## 2021-04-03 ENCOUNTER — Encounter: Payer: Self-pay | Admitting: Podiatry

## 2021-04-03 DIAGNOSIS — M5124 Other intervertebral disc displacement, thoracic region: Secondary | ICD-10-CM | POA: Diagnosis not present

## 2021-04-03 DIAGNOSIS — M9903 Segmental and somatic dysfunction of lumbar region: Secondary | ICD-10-CM | POA: Diagnosis not present

## 2021-04-03 DIAGNOSIS — M5032 Other cervical disc degeneration, mid-cervical region, unspecified level: Secondary | ICD-10-CM | POA: Diagnosis not present

## 2021-04-03 DIAGNOSIS — M5022 Other cervical disc displacement, mid-cervical region, unspecified level: Secondary | ICD-10-CM | POA: Diagnosis not present

## 2021-04-03 DIAGNOSIS — M9901 Segmental and somatic dysfunction of cervical region: Secondary | ICD-10-CM | POA: Diagnosis not present

## 2021-04-03 DIAGNOSIS — M9902 Segmental and somatic dysfunction of thoracic region: Secondary | ICD-10-CM | POA: Diagnosis not present

## 2021-04-03 NOTE — Progress Notes (Signed)
  Subjective:  Patient ID: [name], male    DOB: 1946/05/05,  MRN: [id number]  Chief Complaint  Patient presents with  . Nail Problem    Nail trim Possible callus in the middle of left foot    75 y.o. male returns for the above complaint.  Patient presents with thickened elongated dystrophic toenails x10.  Painful to touch.  Patient would like to have them debrided down.  He is not a diabetic.  He is not able to debride himself.  He also has secondary complaint of left submetatarsal 2 porokeratosis painful to touch painful when ambulating.  He has not tried offloading it.  He denies any treatment options for it.   Objective:  There were no vitals filed for this visit. Podiatric Exam: Vascular: dorsalis pedis and posterior tibial pulses are palpable bilateral. Capillary return is immediate. Temperature gradient is WNL. Skin turgor WNL  Sensorium: Normal Semmes Weinstein monofilament test. Normal tactile sensation bilaterally. Nail Exam: Pt has thick disfigured discolored nails with subungual debris noted bilateral entire nail hallux through fifth toenails.  Pain on palpation to the nails. Ulcer Exam: There is no evidence of ulcer or pre-ulcerative changes or infection. Orthopedic Exam: Muscle tone and strength are WNL. No limitations in general ROM. No crepitus or effusions noted. HAV  B/L.  Hammer toes 2-5  B/L. 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  No pinpoint bleeding noted.. No infection or ulcers    Assessment & Plan:   1. Pain due to onychomycosis of toenails of both feet   2. Porokeratosis     Patient was evaluated and treated and all questions answered.  Left submetatarsal 2 porokeratosis -I explained to the patient the etiology of porokeratosis of her extremity options were discussed.  Given the amount of pain he is having I believe patient will benefit from debridement.  Using chisel blade handle the lesion was debrided down to  healthy striated tissue followed by excision of central nucleated core.  Patient had some relief.  No complication noted.  Onychomycosis with pain  -Nails palliatively debrided as below. -Educated on self-care  Procedure: Nail Debridement Rationale: pain  Type of Debridement: manual, sharp debridement. Instrumentation: Nail nipper, rotary burr. Number of Nails: 10  Procedures and Treatment: Consent by patient was obtained for treatment procedures. The patient understood the discussion of treatment and procedures well. All questions were answered thoroughly reviewed. Debridement of mycotic and hypertrophic toenails, 1 through 5 bilateral and clearing of subungual debris. No ulceration, no infection noted.  Return Visit-Office Procedure: Patient instructed to return to the office for a follow up visit 3 months for continued evaluation and treatment.  Boneta Lucks, DPM    No follow-ups on file.

## 2021-04-04 ENCOUNTER — Other Ambulatory Visit: Payer: Self-pay

## 2021-04-04 ENCOUNTER — Ambulatory Visit (INDEPENDENT_AMBULATORY_CARE_PROVIDER_SITE_OTHER): Payer: Medicare Other

## 2021-04-04 DIAGNOSIS — Z5181 Encounter for therapeutic drug level monitoring: Secondary | ICD-10-CM

## 2021-04-04 DIAGNOSIS — D6859 Other primary thrombophilia: Secondary | ICD-10-CM

## 2021-04-04 LAB — POCT INR: INR: 2 (ref 2.0–3.0)

## 2021-04-04 NOTE — Patient Instructions (Signed)
Description   Continue on same dosage 1/2 tablet daily except for 1 tablet on Tuesdays, Thursdays and Saturdays. Recheck  INR in 3 weeks. Call us with any new medications or concerns [phone] [facility], Main # [phone].

## 2021-04-08 DIAGNOSIS — M5032 Other cervical disc degeneration, mid-cervical region, unspecified level: Secondary | ICD-10-CM | POA: Diagnosis not present

## 2021-04-08 DIAGNOSIS — M9903 Segmental and somatic dysfunction of lumbar region: Secondary | ICD-10-CM | POA: Diagnosis not present

## 2021-04-08 DIAGNOSIS — M5022 Other cervical disc displacement, mid-cervical region, unspecified level: Secondary | ICD-10-CM | POA: Diagnosis not present

## 2021-04-08 DIAGNOSIS — M5124 Other intervertebral disc displacement, thoracic region: Secondary | ICD-10-CM | POA: Diagnosis not present

## 2021-04-08 DIAGNOSIS — M9901 Segmental and somatic dysfunction of cervical region: Secondary | ICD-10-CM | POA: Diagnosis not present

## 2021-04-08 DIAGNOSIS — M9902 Segmental and somatic dysfunction of thoracic region: Secondary | ICD-10-CM | POA: Diagnosis not present

## 2021-04-09 DIAGNOSIS — D485 Neoplasm of uncertain behavior of skin: Secondary | ICD-10-CM | POA: Diagnosis not present

## 2021-04-09 DIAGNOSIS — D0439 Carcinoma in situ of skin of other parts of face: Secondary | ICD-10-CM | POA: Diagnosis not present

## 2021-04-15 IMAGING — CR DG THORACIC SPINE 3V
3 series · 3 of 3 positions shown · non-contrast
Comparison: MRI June 16, 2017

CLINICAL DATA: Arthritis.

EXAM:
THORACIC SPINE - 3 VIEWS

[w thoracic spine ap]
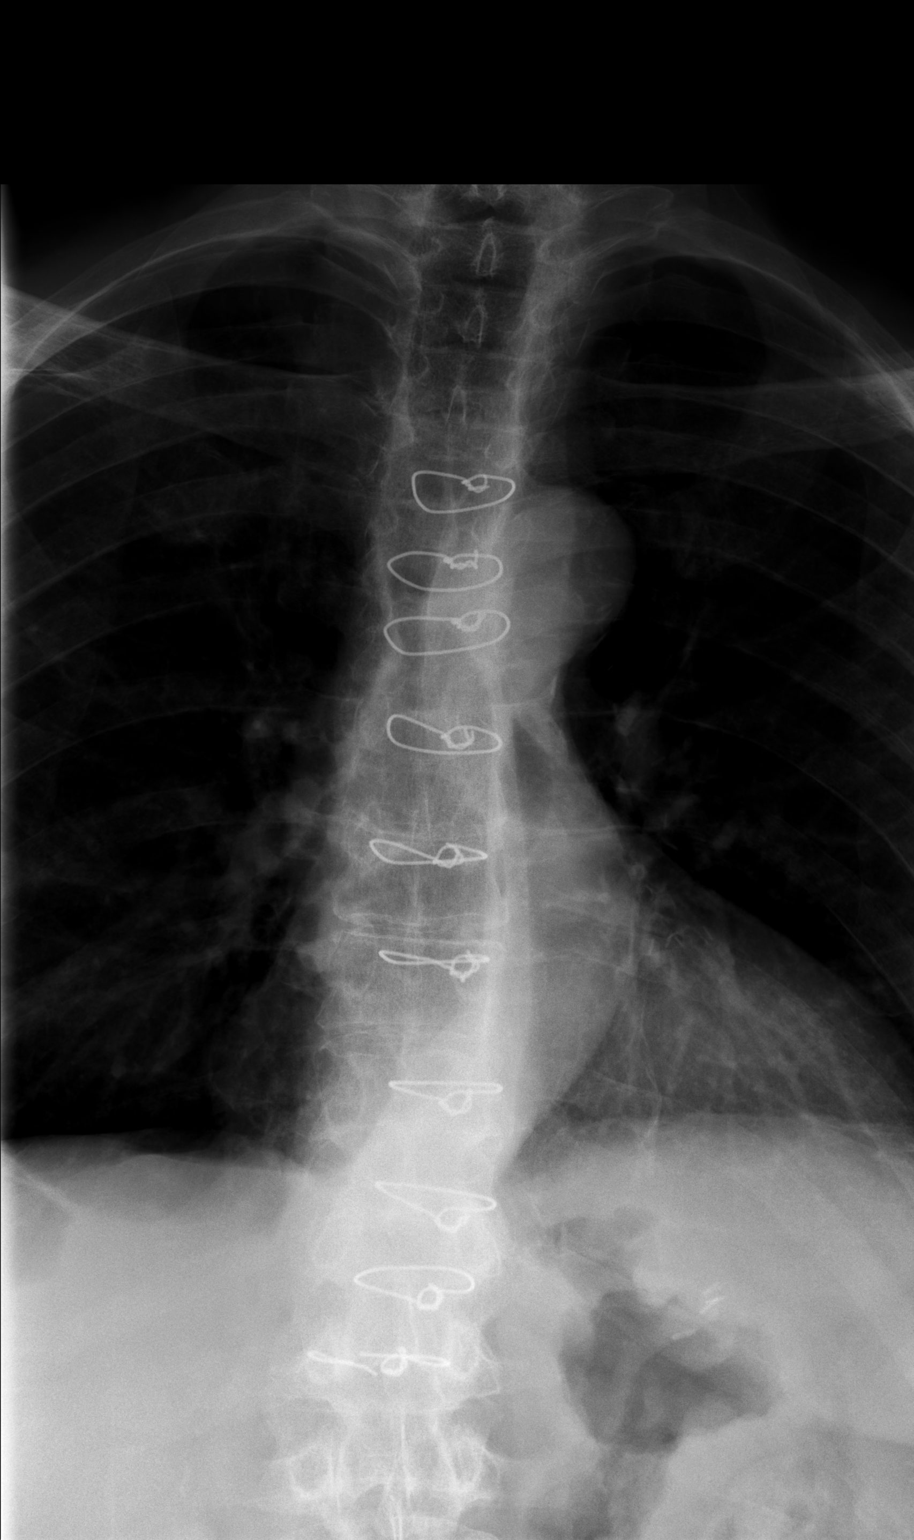

[w thoracic spine lat]
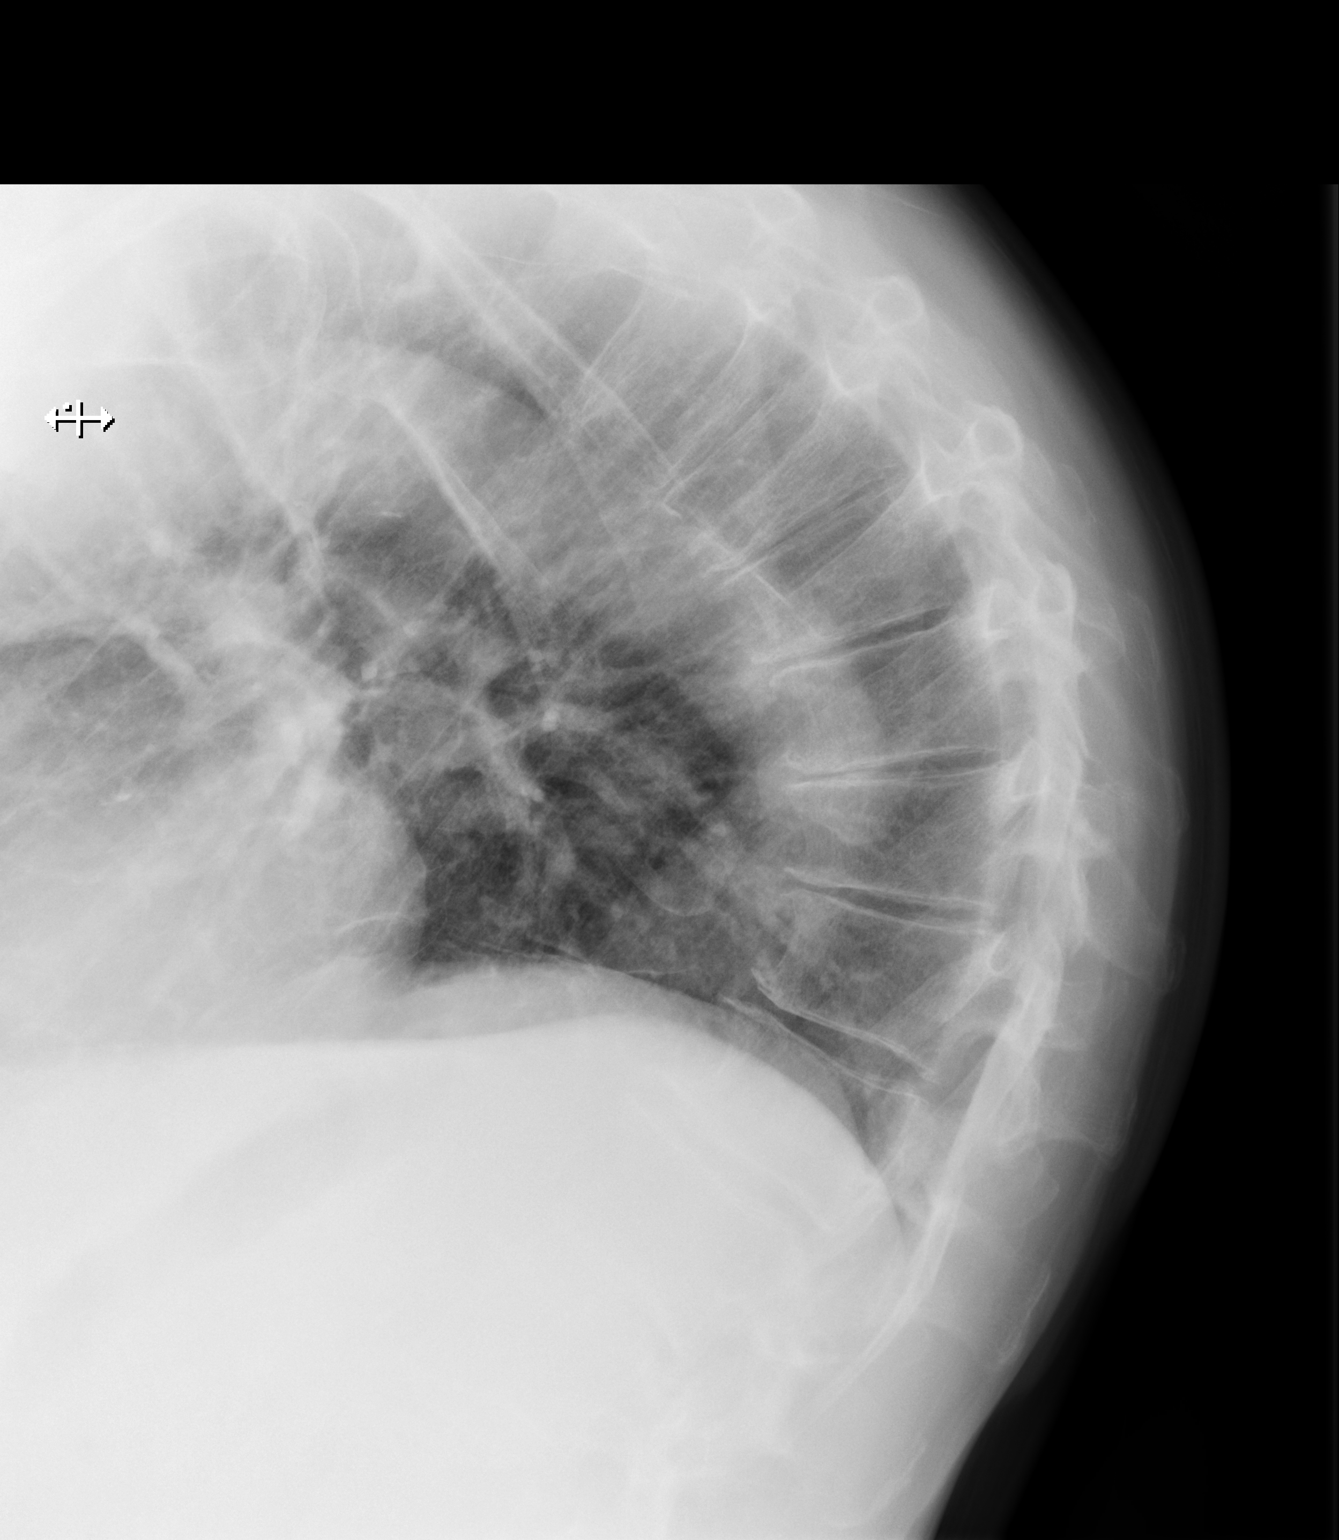

[w thoracic swimmers]
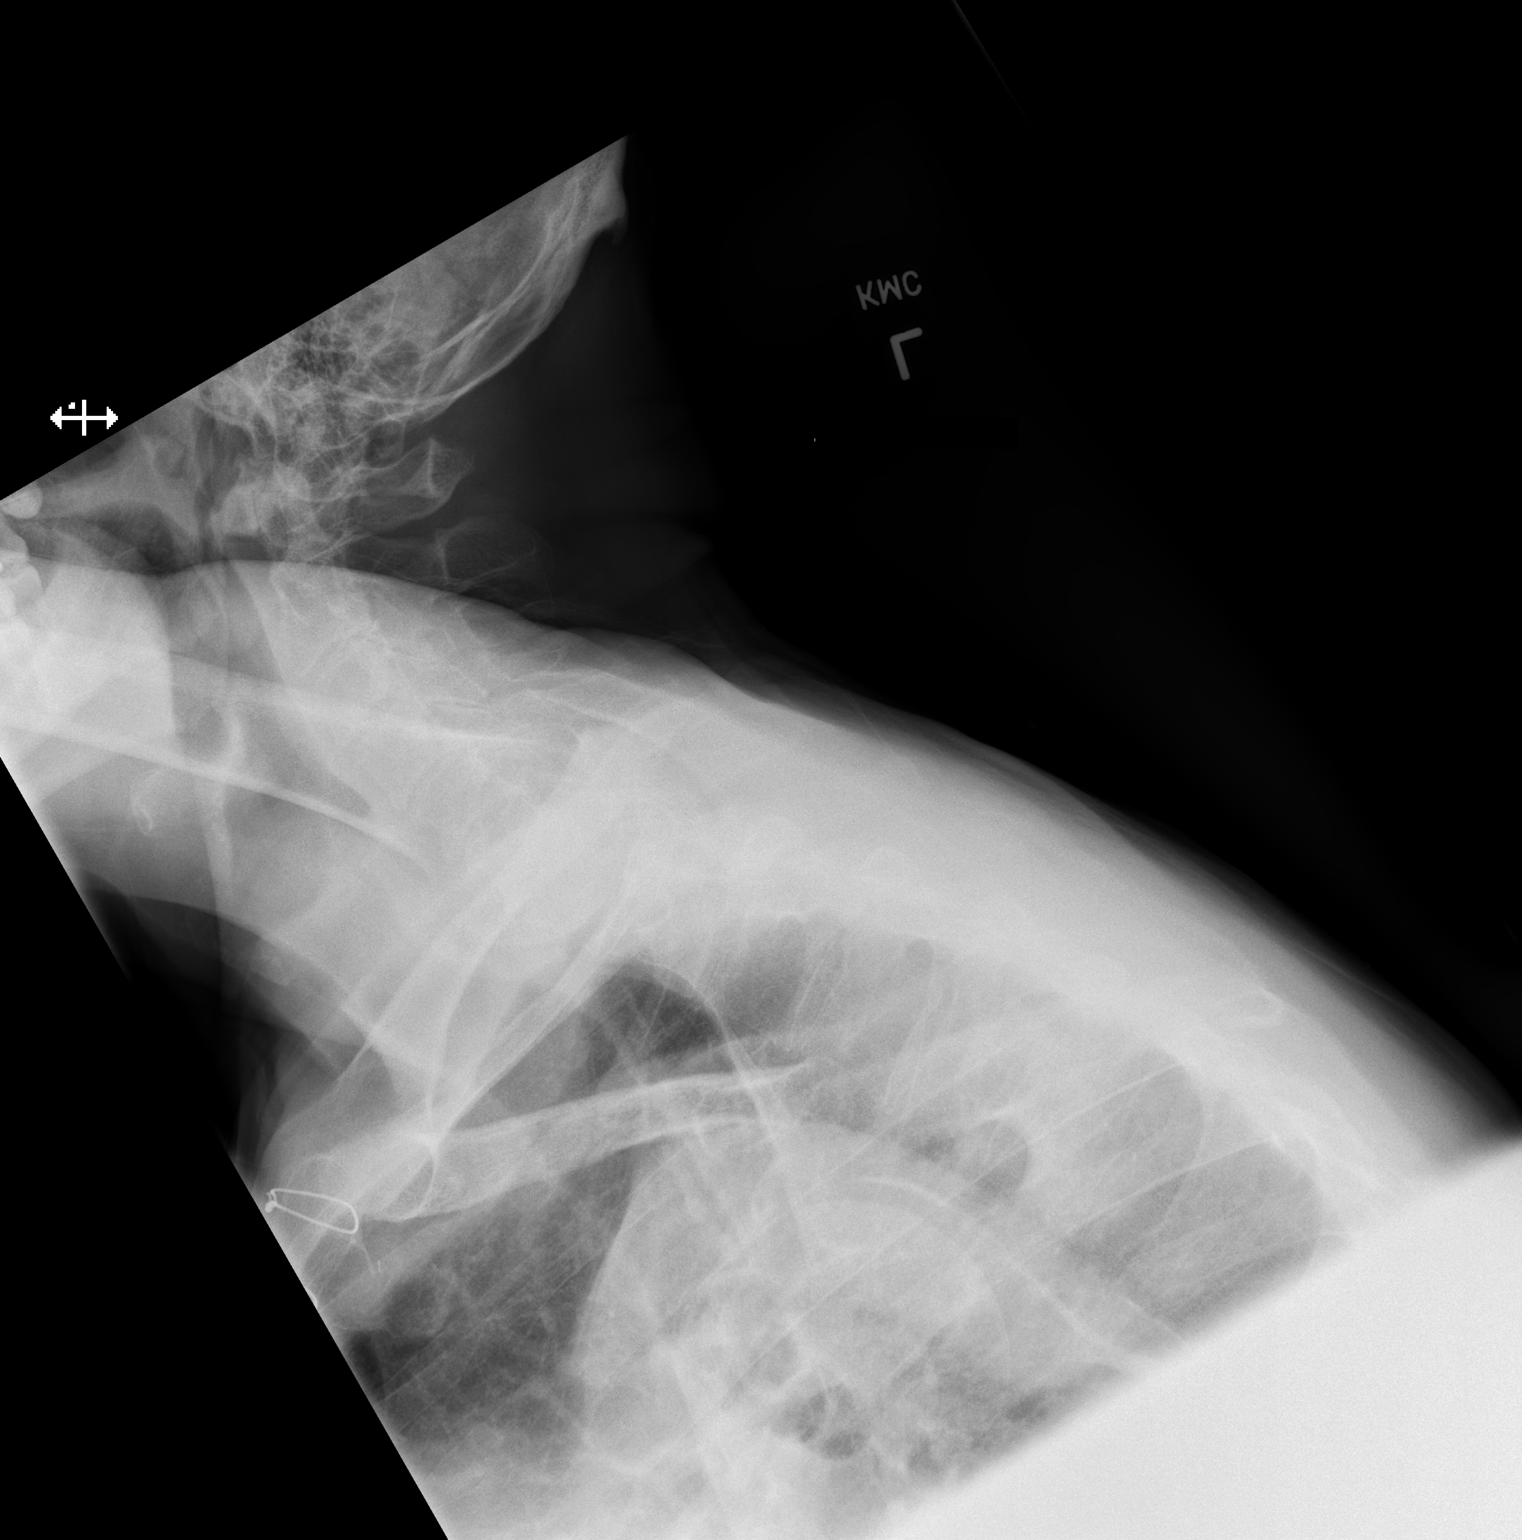

[3 of 3 positions shown; findings below may reference images not displayed]

FINDINGS: Anterior wedging of multiple thoracic vertebral bodies, unchanged
since 6186. Kyphosis identified. No other malalignment. No fractures
are identified.
IMPRESSION: Stable kyphosis.  No acute fracture or other malalignment.

## 2021-04-17 ENCOUNTER — Ambulatory Visit: Payer: Medicare Other | Admitting: Podiatry

## 2021-04-25 ENCOUNTER — Other Ambulatory Visit: Payer: Self-pay

## 2021-04-25 ENCOUNTER — Ambulatory Visit (INDEPENDENT_AMBULATORY_CARE_PROVIDER_SITE_OTHER): Payer: Medicare Other

## 2021-04-25 DIAGNOSIS — D6859 Other primary thrombophilia: Secondary | ICD-10-CM | POA: Diagnosis not present

## 2021-04-25 DIAGNOSIS — Z5181 Encounter for therapeutic drug level monitoring: Secondary | ICD-10-CM

## 2021-04-25 LAB — POCT INR: INR: 2.3 (ref 2.0–3.0)

## 2021-04-25 NOTE — Patient Instructions (Signed)
Description   Continue on same dosage 1/2 tablet daily except for 1 tablet on Tuesdays, Thursdays and Saturdays. Recheck INR in 4 weeks. Call us with any new medications or concerns [phone] [facility], Main # [phone].

## 2021-05-14 ENCOUNTER — Other Ambulatory Visit: Payer: Self-pay

## 2021-05-14 ENCOUNTER — Other Ambulatory Visit (HOSPITAL_BASED_OUTPATIENT_CLINIC_OR_DEPARTMENT_OTHER): Payer: Self-pay

## 2021-05-14 ENCOUNTER — Ambulatory Visit: Payer: Medicare Other | Attending: Internal Medicine

## 2021-05-14 DIAGNOSIS — Z23 Encounter for immunization: Secondary | ICD-10-CM

## 2021-05-14 MED ORDER — PFIZER-BIONT COVID-19 VAC-TRIS 30 MCG/0.3ML IM SUSP
INTRAMUSCULAR | 0 refills | Status: DC
  Filled 2021-05-14: qty 0.3, 1d supply, fill #0

## 2021-05-14 NOTE — Progress Notes (Signed)
   Covid-19 Vaccination Clinic  Name:  [name]    MRN: [id number] DOB: 17-May-1946  05/14/2021  [name] was observed post Covid-19 immunization for 15 minutes without incident. He was provided with Vaccine Information Sheet and instruction to access the V-Safe system.   [name] was instructed to call 911 with any severe reactions post vaccine: Marland Kitchen Difficulty breathing  . Swelling of face and throat  . A fast heartbeat  . A bad rash all over body  . Dizziness and weakness   Immunizations Administered    Name Date Dose VIS Date Route   PFIZER Comrnaty(Gray TOP) Covid-19 Vaccine 05/14/2021 10:14 AM 0.3 mL 11/15/2020 Intramuscular   Manufacturer: [employer]   Lot: [id number]   [place]: [phone]

## 2021-05-23 ENCOUNTER — Ambulatory Visit (INDEPENDENT_AMBULATORY_CARE_PROVIDER_SITE_OTHER): Payer: Medicare Other | Admitting: Pharmacist

## 2021-05-23 ENCOUNTER — Other Ambulatory Visit: Payer: Self-pay

## 2021-05-23 DIAGNOSIS — Z5181 Encounter for therapeutic drug level monitoring: Secondary | ICD-10-CM | POA: Diagnosis not present

## 2021-05-23 DIAGNOSIS — D6859 Other primary thrombophilia: Secondary | ICD-10-CM

## 2021-05-23 DIAGNOSIS — Z7901 Long term (current) use of anticoagulants: Secondary | ICD-10-CM

## 2021-05-23 LAB — POCT INR: INR: 2.1 (ref 2.0–3.0)

## 2021-05-23 NOTE — Patient Instructions (Signed)
Description   Continue on same dosage 1/2 tablet daily except for 1 tablet on Tuesdays, Thursdays and Saturdays. Recheck INR in 4 weeks. Call us with any new medications or concerns [phone] [facility], Main # [phone].

## 2021-06-03 DIAGNOSIS — M5032 Other cervical disc degeneration, mid-cervical region, unspecified level: Secondary | ICD-10-CM | POA: Diagnosis not present

## 2021-06-03 DIAGNOSIS — M5022 Other cervical disc displacement, mid-cervical region, unspecified level: Secondary | ICD-10-CM | POA: Diagnosis not present

## 2021-06-03 DIAGNOSIS — M9903 Segmental and somatic dysfunction of lumbar region: Secondary | ICD-10-CM | POA: Diagnosis not present

## 2021-06-03 DIAGNOSIS — M9901 Segmental and somatic dysfunction of cervical region: Secondary | ICD-10-CM | POA: Diagnosis not present

## 2021-06-03 DIAGNOSIS — M5124 Other intervertebral disc displacement, thoracic region: Secondary | ICD-10-CM | POA: Diagnosis not present

## 2021-06-03 DIAGNOSIS — M9902 Segmental and somatic dysfunction of thoracic region: Secondary | ICD-10-CM | POA: Diagnosis not present

## 2021-06-04 NOTE — Progress Notes (Signed)
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  [facility] have been having trouble getting refills in time before he runs out, so I will send a 7 day monthly supply at his local pharmacy as well. Encouraged mental activities and exercise (as tolerated due to arthritic pain) Follow up in 6 months.  Subjective:  [name] is a 75 year old right-handed male with HTN, CAD, bradycardia and OA who follows up for Alzheimer's disease.  He is accompanied by his wife who supplements history.  UPDATE: Taking donepezil 10mg  at bedtime. Overall, symptoms are stable.  He has trouble recalling names or recognizing actors in movies, which is unusual as he enjoys watching films daily.  He may forget a conversation by the following day.  Otherwise, he is doing well. He frequently and enjoys cooking.   HISTORY: His wife has noted memory problems since 2020 that has gradually progressed.  He started having trouble calculating the checkbook and would be off by several hundreds of dollars.  He also quickly forgets conversations and would repeat questions after just minutes.  He hasn't driven for years but as a passenger he denies disorientation on familiar routes. On a recent cruise, he had trouble keeping up with the schedule and activities.  He has a system to remember to take his medications independently.  No difficulty with performing home chores or activities.  At first, it was thought to be related to his chronic back/radicular pain but now the memory problems appear to be out of proportion to being in pain.     He has family history of Alzheimer's disease on his mother's side, including his mother, aunts, uncles and grandmother.  PAST MEDICAL HISTORY: Past Medical History:  Diagnosis Date   Allergy    Arthritis    CAD (coronary artery disease) 1996   POST  CABG   Cataracts, bilateral    Chicken pox    Dyslipidemia    Hyperlipidemia    Hypertension    Sinus bradycardia     MEDICATIONS: Current Outpatient Medications on File Prior to Visit  Medication Sig Dispense Refill   acetaminophen (TYLENOL) 500 MG tablet Take 1,000 mg by mouth every 6 (six) hours as needed for moderate pain or headache.     amLODipine (NORVASC) 5 MG tablet Take 1.5 tablets (7.5 mg total) by mouth daily. 135 tablet 2   cholecalciferol (VITAMIN D) 1000 units tablet Take 1,000 Units by mouth daily.      COVID-19 mRNA Vac-TriS, Pfizer, (PFIZER-BIONT COVID-19 VAC-TRIS) SUSP injection Inject into the muscle. 0.3 mL 0   Cyanocobalamin (VITAMIN B12) 500 MCG TABS Take 500 mcg by mouth daily.      donepezil (ARICEPT) 10 MG tablet Take 1 tablet (10 mg total) by mouth at bedtime. 90 tablet 1   ezetimibe (ZETIA) 10 MG tablet TAKE 1 TABLET AT BEDTIME 90 tablet 3   fenofibrate (TRICOR) 145 MG tablet TAKE 1 TABLET AT BEDTIME 90 tablet 3   furosemide (LASIX) 20 MG tablet Take 1 tablet (20 mg total) by mouth daily. May take an extra dose 20mg  twice a week for swelling 90 tablet 3   hydrOXYzine (ATARAX/VISTARIL) 25 MG tablet Take 1 tablet (25 mg total) by mouth daily as needed for anxiety (daily as needed before flying.). 30 tablet 0   losartan (COZAAR) 100 MG tablet TAKE 1 TABLET DAILY 90 tablet 3  nitroGLYCERIN (NITROSTAT) 0.4 MG SL tablet Place 1 tablet (0.4 mg total) under the tongue every 5 (five) minutes as needed for chest pain. 75 tablet 2   Olopatadine HCl (PATADAY OP) Place 1 drop into both eyes daily as needed (allergies).     rosuvastatin (CRESTOR) 40 MG tablet Take 1 tablet (40 mg total) by mouth daily. 90 tablet 3   traMADol (ULTRAM) 50 MG tablet Take 1 tablet (50 mg total) by mouth daily as needed. 30 tablet 0   warfarin (COUMADIN) 5 MG tablet TAKE AS DIRECTED BY COUMADIN CLINIC 80 tablet 1   No current facility-administered medications on file prior to visit.     ALLERGIES: Allergies  Allergen Reactions   Other     Environmental allergies   Latex     Itchy hands    FAMILY HISTORY: Family History  Problem Relation Age of Onset   Alzheimer's disease Mother    Cancer Father        prostate   Prostate cancer Father    AAA (abdominal aortic aneurysm) Sister    Hypertension Maternal Grandmother    Hypertension Paternal Grandmother    Colon cancer Neg Hx    Esophageal cancer Neg Hx    Pancreatic cancer Neg Hx    Liver disease Neg Hx       Objective:  Blood pressure (!) 154/75, pulse 98, height 6\' 3"  (1.905 m), weight 194 lb (88 kg), SpO2 (!) 20 %. General: No acute distress.  Patient appears well-groomed.   Head:  Normocephalic/atraumatic Eyes:  Fundi examined but not visualized Neck: supple, no paraspinal tenderness, full range of motion Heart:  Regular rate and rhythm Lungs:  Clear to auscultation bilaterally Back: No paraspinal tenderness Neurological Exam: alert and oriented to person, place, and time. CN II-XII intact. Bulk and tone normal, muscle strength 5/5 throughout.  Sensation to light touch intact.  Deep tendon reflexes 2+ throughout.  Finger to nose and heel to shin testing intact.  Antalgic gait.  Cannot appropriately evaluate Romberg.     Metta Clines, DO  CC: Timothy Martinique, MD

## 2021-06-06 ENCOUNTER — Encounter: Payer: Self-pay | Admitting: Neurology

## 2021-06-06 ENCOUNTER — Other Ambulatory Visit: Payer: Self-pay

## 2021-06-06 ENCOUNTER — Ambulatory Visit (INDEPENDENT_AMBULATORY_CARE_PROVIDER_SITE_OTHER): Payer: Medicare Other | Admitting: Neurology

## 2021-06-06 VITALS — BP 154/75 | HR 98 | Ht 75.0 in | Wt 194.0 lb

## 2021-06-06 DIAGNOSIS — G309 Alzheimer's disease, unspecified: Secondary | ICD-10-CM | POA: Diagnosis not present

## 2021-06-06 DIAGNOSIS — G3184 Mild cognitive impairment, so stated: Secondary | ICD-10-CM

## 2021-06-06 DIAGNOSIS — F0670 Mild neurocognitive disorder due to known physiological condition without behavioral disturbance: Secondary | ICD-10-CM

## 2021-06-06 DIAGNOSIS — I25118 Atherosclerotic heart disease of native coronary artery with other forms of angina pectoris: Secondary | ICD-10-CM

## 2021-06-06 MED ORDER — DONEPEZIL HCL 10 MG PO TABS: 10.0000 mg | ORAL_TABLET | Freq: Every day | ORAL | 5 refills | Status: DC

## 2021-06-06 MED ORDER — DONEPEZIL HCL 10 MG PO TABS: 10.0000 mg | ORAL_TABLET | Freq: Every day | ORAL | 1 refills | Status: DC

## 2021-06-06 NOTE — Telephone Encounter (Signed)
Error

## 2021-06-06 NOTE — Patient Instructions (Signed)
Donepezil 10mg  at bedtime refilled - [facility] - 7 day a month to [facility]

## 2021-06-11 DIAGNOSIS — D0439 Carcinoma in situ of skin of other parts of face: Secondary | ICD-10-CM | POA: Diagnosis not present

## 2021-06-17 DIAGNOSIS — M5032 Other cervical disc degeneration, mid-cervical region, unspecified level: Secondary | ICD-10-CM | POA: Diagnosis not present

## 2021-06-17 DIAGNOSIS — M9902 Segmental and somatic dysfunction of thoracic region: Secondary | ICD-10-CM | POA: Diagnosis not present

## 2021-06-17 DIAGNOSIS — M5022 Other cervical disc displacement, mid-cervical region, unspecified level: Secondary | ICD-10-CM | POA: Diagnosis not present

## 2021-06-17 DIAGNOSIS — M5124 Other intervertebral disc displacement, thoracic region: Secondary | ICD-10-CM | POA: Diagnosis not present

## 2021-06-17 DIAGNOSIS — M9903 Segmental and somatic dysfunction of lumbar region: Secondary | ICD-10-CM | POA: Diagnosis not present

## 2021-06-17 DIAGNOSIS — M9901 Segmental and somatic dysfunction of cervical region: Secondary | ICD-10-CM | POA: Diagnosis not present

## 2021-06-19 DIAGNOSIS — M5022 Other cervical disc displacement, mid-cervical region, unspecified level: Secondary | ICD-10-CM | POA: Diagnosis not present

## 2021-06-19 DIAGNOSIS — M5032 Other cervical disc degeneration, mid-cervical region, unspecified level: Secondary | ICD-10-CM | POA: Diagnosis not present

## 2021-06-19 DIAGNOSIS — M5124 Other intervertebral disc displacement, thoracic region: Secondary | ICD-10-CM | POA: Diagnosis not present

## 2021-06-19 DIAGNOSIS — M9902 Segmental and somatic dysfunction of thoracic region: Secondary | ICD-10-CM | POA: Diagnosis not present

## 2021-06-19 DIAGNOSIS — M9903 Segmental and somatic dysfunction of lumbar region: Secondary | ICD-10-CM | POA: Diagnosis not present

## 2021-06-19 DIAGNOSIS — M9901 Segmental and somatic dysfunction of cervical region: Secondary | ICD-10-CM | POA: Diagnosis not present

## 2021-06-20 ENCOUNTER — Other Ambulatory Visit: Payer: Self-pay

## 2021-06-20 ENCOUNTER — Ambulatory Visit (INDEPENDENT_AMBULATORY_CARE_PROVIDER_SITE_OTHER): Payer: Medicare Other

## 2021-06-20 DIAGNOSIS — Z5181 Encounter for therapeutic drug level monitoring: Secondary | ICD-10-CM

## 2021-06-20 DIAGNOSIS — D6859 Other primary thrombophilia: Secondary | ICD-10-CM | POA: Diagnosis not present

## 2021-06-20 LAB — POCT INR: INR: 1.8 — AB (ref 2.0–3.0)

## 2021-06-20 NOTE — Patient Instructions (Signed)
Description   Take 1.5 tablets today, then resume same dosage 1/2 tablet daily except for 1 tablet on Tuesdays, Thursdays and Saturdays. Recheck INR in 3 weeks. Call us with any new medications or concerns [phone] [facility] Main # [phone].

## 2021-06-25 ENCOUNTER — Other Ambulatory Visit: Payer: Self-pay

## 2021-06-25 ENCOUNTER — Ambulatory Visit (INDEPENDENT_AMBULATORY_CARE_PROVIDER_SITE_OTHER): Payer: Medicare Other | Admitting: Podiatry

## 2021-06-25 ENCOUNTER — Encounter: Payer: Self-pay | Admitting: Podiatry

## 2021-06-25 DIAGNOSIS — M79675 Pain in left toe(s): Secondary | ICD-10-CM | POA: Diagnosis not present

## 2021-06-25 DIAGNOSIS — Z7901 Long term (current) use of anticoagulants: Secondary | ICD-10-CM | POA: Diagnosis not present

## 2021-06-25 DIAGNOSIS — Q828 Other specified congenital malformations of skin: Secondary | ICD-10-CM | POA: Diagnosis not present

## 2021-06-25 DIAGNOSIS — M79674 Pain in right toe(s): Secondary | ICD-10-CM

## 2021-06-25 DIAGNOSIS — B351 Tinea unguium: Secondary | ICD-10-CM | POA: Diagnosis not present

## 2021-06-28 ENCOUNTER — Ambulatory Visit: Payer: Medicare Other | Admitting: Podiatry

## 2021-06-28 ENCOUNTER — Other Ambulatory Visit (HOSPITAL_BASED_OUTPATIENT_CLINIC_OR_DEPARTMENT_OTHER): Payer: Self-pay

## 2021-06-28 NOTE — Progress Notes (Signed)
  Subjective:  Patient ID: [name], male    DOB: 1946-03-26,  MRN: [id number]  Chief Complaint  Patient presents with   Nail Problem    Thick painful toenails, 3 month follow up   Plantar Warts    Left foot    75 y.o. male presents with the above complaint. History confirmed with patient.  He is on warfarin  Objective:  Physical Exam: warm, good capillary refill, no trophic changes or ulcerative lesions, and normal DP and PT pulses.  He has loss objective sensation bilaterally.  He is a segment 2 porokeratosis on the left.  Thickened elongated painful toenails with yellow discoloration and subungual debris bilaterally x10 Assessment:  No diagnosis found.   Plan:  Patient was evaluated and treated and all questions answered.    All symptomatic hyperkeratoses were safely debrided with a sterile #15 blade to patient's level of comfort without incident. We discussed preventative and palliative care of these lesions including supportive and accommodative shoegear, padding, prefabricated and custom molded accommodative orthoses, use of a pumice stone and lotions/creams daily.  Discussed the etiology and treatment options for the condition in detail with the patient. Educated patient on the topical and oral treatment options for mycotic nails. Recommended debridement of the nails today. Sharp and mechanical debridement performed of all painful and mycotic nails today. Nails debrided in length and thickness using a nail nipper to level of comfort. Discussed treatment options including appropriate shoe gear. Follow up as needed for painful nails.    Return in about 3 months (around 09/25/2021) for at risk foot care.

## 2021-07-01 DIAGNOSIS — M9903 Segmental and somatic dysfunction of lumbar region: Secondary | ICD-10-CM | POA: Diagnosis not present

## 2021-07-01 DIAGNOSIS — M5022 Other cervical disc displacement, mid-cervical region, unspecified level: Secondary | ICD-10-CM | POA: Diagnosis not present

## 2021-07-01 DIAGNOSIS — M5032 Other cervical disc degeneration, mid-cervical region, unspecified level: Secondary | ICD-10-CM | POA: Diagnosis not present

## 2021-07-01 DIAGNOSIS — M9901 Segmental and somatic dysfunction of cervical region: Secondary | ICD-10-CM | POA: Diagnosis not present

## 2021-07-01 DIAGNOSIS — M9902 Segmental and somatic dysfunction of thoracic region: Secondary | ICD-10-CM | POA: Diagnosis not present

## 2021-07-01 DIAGNOSIS — M5124 Other intervertebral disc displacement, thoracic region: Secondary | ICD-10-CM | POA: Diagnosis not present

## 2021-07-03 DIAGNOSIS — M5032 Other cervical disc degeneration, mid-cervical region, unspecified level: Secondary | ICD-10-CM | POA: Diagnosis not present

## 2021-07-03 DIAGNOSIS — M9902 Segmental and somatic dysfunction of thoracic region: Secondary | ICD-10-CM | POA: Diagnosis not present

## 2021-07-03 DIAGNOSIS — M9903 Segmental and somatic dysfunction of lumbar region: Secondary | ICD-10-CM | POA: Diagnosis not present

## 2021-07-03 DIAGNOSIS — M5022 Other cervical disc displacement, mid-cervical region, unspecified level: Secondary | ICD-10-CM | POA: Diagnosis not present

## 2021-07-03 DIAGNOSIS — M9901 Segmental and somatic dysfunction of cervical region: Secondary | ICD-10-CM | POA: Diagnosis not present

## 2021-07-03 DIAGNOSIS — M5124 Other intervertebral disc displacement, thoracic region: Secondary | ICD-10-CM | POA: Diagnosis not present

## 2021-07-06 ENCOUNTER — Other Ambulatory Visit: Payer: Self-pay | Admitting: Interventional Cardiology

## 2021-07-08 DIAGNOSIS — M9902 Segmental and somatic dysfunction of thoracic region: Secondary | ICD-10-CM | POA: Diagnosis not present

## 2021-07-08 DIAGNOSIS — M5022 Other cervical disc displacement, mid-cervical region, unspecified level: Secondary | ICD-10-CM | POA: Diagnosis not present

## 2021-07-08 DIAGNOSIS — M5032 Other cervical disc degeneration, mid-cervical region, unspecified level: Secondary | ICD-10-CM | POA: Diagnosis not present

## 2021-07-08 DIAGNOSIS — M9903 Segmental and somatic dysfunction of lumbar region: Secondary | ICD-10-CM | POA: Diagnosis not present

## 2021-07-08 DIAGNOSIS — M5124 Other intervertebral disc displacement, thoracic region: Secondary | ICD-10-CM | POA: Diagnosis not present

## 2021-07-08 DIAGNOSIS — M9901 Segmental and somatic dysfunction of cervical region: Secondary | ICD-10-CM | POA: Diagnosis not present

## 2021-07-10 DIAGNOSIS — M5124 Other intervertebral disc displacement, thoracic region: Secondary | ICD-10-CM | POA: Diagnosis not present

## 2021-07-10 DIAGNOSIS — M9902 Segmental and somatic dysfunction of thoracic region: Secondary | ICD-10-CM | POA: Diagnosis not present

## 2021-07-10 DIAGNOSIS — M5022 Other cervical disc displacement, mid-cervical region, unspecified level: Secondary | ICD-10-CM | POA: Diagnosis not present

## 2021-07-10 DIAGNOSIS — M9901 Segmental and somatic dysfunction of cervical region: Secondary | ICD-10-CM | POA: Diagnosis not present

## 2021-07-10 DIAGNOSIS — M9903 Segmental and somatic dysfunction of lumbar region: Secondary | ICD-10-CM | POA: Diagnosis not present

## 2021-07-10 DIAGNOSIS — M5032 Other cervical disc degeneration, mid-cervical region, unspecified level: Secondary | ICD-10-CM | POA: Diagnosis not present

## 2021-07-11 ENCOUNTER — Ambulatory Visit (INDEPENDENT_AMBULATORY_CARE_PROVIDER_SITE_OTHER): Payer: Medicare Other

## 2021-07-11 ENCOUNTER — Other Ambulatory Visit: Payer: Self-pay

## 2021-07-11 DIAGNOSIS — Z5181 Encounter for therapeutic drug level monitoring: Secondary | ICD-10-CM

## 2021-07-11 DIAGNOSIS — D6859 Other primary thrombophilia: Secondary | ICD-10-CM | POA: Diagnosis not present

## 2021-07-11 LAB — POCT INR: INR: 1.9 — AB (ref 2.0–3.0)

## 2021-07-11 NOTE — Patient Instructions (Signed)
Description   Start taking 1 tablet daily except for 1/2 tablet on Mondays, Wednesdays and Fridays.  Recheck INR in 3 weeks. Call us with any new medications or concerns [phone] [facility], Main # [phone].

## 2021-07-15 DIAGNOSIS — M9901 Segmental and somatic dysfunction of cervical region: Secondary | ICD-10-CM | POA: Diagnosis not present

## 2021-07-15 DIAGNOSIS — M9903 Segmental and somatic dysfunction of lumbar region: Secondary | ICD-10-CM | POA: Diagnosis not present

## 2021-07-15 DIAGNOSIS — M5022 Other cervical disc displacement, mid-cervical region, unspecified level: Secondary | ICD-10-CM | POA: Diagnosis not present

## 2021-07-15 DIAGNOSIS — M9902 Segmental and somatic dysfunction of thoracic region: Secondary | ICD-10-CM | POA: Diagnosis not present

## 2021-07-15 DIAGNOSIS — M5032 Other cervical disc degeneration, mid-cervical region, unspecified level: Secondary | ICD-10-CM | POA: Diagnosis not present

## 2021-07-15 DIAGNOSIS — M5124 Other intervertebral disc displacement, thoracic region: Secondary | ICD-10-CM | POA: Diagnosis not present

## 2021-07-17 DIAGNOSIS — M5032 Other cervical disc degeneration, mid-cervical region, unspecified level: Secondary | ICD-10-CM | POA: Diagnosis not present

## 2021-07-17 DIAGNOSIS — M9903 Segmental and somatic dysfunction of lumbar region: Secondary | ICD-10-CM | POA: Diagnosis not present

## 2021-07-17 DIAGNOSIS — M9901 Segmental and somatic dysfunction of cervical region: Secondary | ICD-10-CM | POA: Diagnosis not present

## 2021-07-17 DIAGNOSIS — M5124 Other intervertebral disc displacement, thoracic region: Secondary | ICD-10-CM | POA: Diagnosis not present

## 2021-07-17 DIAGNOSIS — M5022 Other cervical disc displacement, mid-cervical region, unspecified level: Secondary | ICD-10-CM | POA: Diagnosis not present

## 2021-07-17 DIAGNOSIS — M9902 Segmental and somatic dysfunction of thoracic region: Secondary | ICD-10-CM | POA: Diagnosis not present

## 2021-07-22 DIAGNOSIS — M9902 Segmental and somatic dysfunction of thoracic region: Secondary | ICD-10-CM | POA: Diagnosis not present

## 2021-07-22 DIAGNOSIS — M9903 Segmental and somatic dysfunction of lumbar region: Secondary | ICD-10-CM | POA: Diagnosis not present

## 2021-07-22 DIAGNOSIS — M5124 Other intervertebral disc displacement, thoracic region: Secondary | ICD-10-CM | POA: Diagnosis not present

## 2021-07-22 DIAGNOSIS — M5022 Other cervical disc displacement, mid-cervical region, unspecified level: Secondary | ICD-10-CM | POA: Diagnosis not present

## 2021-07-22 DIAGNOSIS — M9901 Segmental and somatic dysfunction of cervical region: Secondary | ICD-10-CM | POA: Diagnosis not present

## 2021-07-22 DIAGNOSIS — M5032 Other cervical disc degeneration, mid-cervical region, unspecified level: Secondary | ICD-10-CM | POA: Diagnosis not present

## 2021-07-24 DIAGNOSIS — M5022 Other cervical disc displacement, mid-cervical region, unspecified level: Secondary | ICD-10-CM | POA: Diagnosis not present

## 2021-07-24 DIAGNOSIS — M9901 Segmental and somatic dysfunction of cervical region: Secondary | ICD-10-CM | POA: Diagnosis not present

## 2021-07-24 DIAGNOSIS — M5032 Other cervical disc degeneration, mid-cervical region, unspecified level: Secondary | ICD-10-CM | POA: Diagnosis not present

## 2021-07-24 DIAGNOSIS — M9902 Segmental and somatic dysfunction of thoracic region: Secondary | ICD-10-CM | POA: Diagnosis not present

## 2021-07-24 DIAGNOSIS — M5124 Other intervertebral disc displacement, thoracic region: Secondary | ICD-10-CM | POA: Diagnosis not present

## 2021-07-24 DIAGNOSIS — M9903 Segmental and somatic dysfunction of lumbar region: Secondary | ICD-10-CM | POA: Diagnosis not present

## 2021-08-01 ENCOUNTER — Ambulatory Visit (INDEPENDENT_AMBULATORY_CARE_PROVIDER_SITE_OTHER): Payer: Medicare Other | Admitting: *Deleted

## 2021-08-01 ENCOUNTER — Other Ambulatory Visit: Payer: Self-pay

## 2021-08-01 DIAGNOSIS — Z5181 Encounter for therapeutic drug level monitoring: Secondary | ICD-10-CM | POA: Diagnosis not present

## 2021-08-01 DIAGNOSIS — D6859 Other primary thrombophilia: Secondary | ICD-10-CM | POA: Diagnosis not present

## 2021-08-01 LAB — POCT INR: INR: 2 (ref 2.0–3.0)

## 2021-08-01 NOTE — Patient Instructions (Signed)
Description   Continue taking 1 tablet daily except for 1/2 tablet on Mondays, Wednesdays and Fridays.  Recheck INR in 4 weeks. Call us with any new medications or concerns [phone] [facility], Main # [phone].

## 2021-08-05 DIAGNOSIS — M9902 Segmental and somatic dysfunction of thoracic region: Secondary | ICD-10-CM | POA: Diagnosis not present

## 2021-08-05 DIAGNOSIS — M9903 Segmental and somatic dysfunction of lumbar region: Secondary | ICD-10-CM | POA: Diagnosis not present

## 2021-08-05 DIAGNOSIS — M9901 Segmental and somatic dysfunction of cervical region: Secondary | ICD-10-CM | POA: Diagnosis not present

## 2021-08-05 DIAGNOSIS — M5022 Other cervical disc displacement, mid-cervical region, unspecified level: Secondary | ICD-10-CM | POA: Diagnosis not present

## 2021-08-05 DIAGNOSIS — M5032 Other cervical disc degeneration, mid-cervical region, unspecified level: Secondary | ICD-10-CM | POA: Diagnosis not present

## 2021-08-05 DIAGNOSIS — M5124 Other intervertebral disc displacement, thoracic region: Secondary | ICD-10-CM | POA: Diagnosis not present

## 2021-08-06 ENCOUNTER — Ambulatory Visit (INDEPENDENT_AMBULATORY_CARE_PROVIDER_SITE_OTHER): Payer: Medicare Other | Admitting: Podiatry

## 2021-08-06 ENCOUNTER — Other Ambulatory Visit: Payer: Self-pay

## 2021-08-06 DIAGNOSIS — B351 Tinea unguium: Secondary | ICD-10-CM

## 2021-08-06 DIAGNOSIS — M79675 Pain in left toe(s): Secondary | ICD-10-CM | POA: Diagnosis not present

## 2021-08-06 DIAGNOSIS — Z7901 Long term (current) use of anticoagulants: Secondary | ICD-10-CM | POA: Diagnosis not present

## 2021-08-06 DIAGNOSIS — M79674 Pain in right toe(s): Secondary | ICD-10-CM | POA: Diagnosis not present

## 2021-08-06 MED ORDER — CICLOPIROX 8 % EX SOLN: Freq: Every day | CUTANEOUS | 6 refills | Status: DC

## 2021-08-07 DIAGNOSIS — M5022 Other cervical disc displacement, mid-cervical region, unspecified level: Secondary | ICD-10-CM | POA: Diagnosis not present

## 2021-08-07 DIAGNOSIS — M9903 Segmental and somatic dysfunction of lumbar region: Secondary | ICD-10-CM | POA: Diagnosis not present

## 2021-08-07 DIAGNOSIS — M9901 Segmental and somatic dysfunction of cervical region: Secondary | ICD-10-CM | POA: Diagnosis not present

## 2021-08-07 DIAGNOSIS — M9902 Segmental and somatic dysfunction of thoracic region: Secondary | ICD-10-CM | POA: Diagnosis not present

## 2021-08-07 DIAGNOSIS — M5032 Other cervical disc degeneration, mid-cervical region, unspecified level: Secondary | ICD-10-CM | POA: Diagnosis not present

## 2021-08-07 DIAGNOSIS — M5124 Other intervertebral disc displacement, thoracic region: Secondary | ICD-10-CM | POA: Diagnosis not present

## 2021-08-07 NOTE — Progress Notes (Signed)
  Subjective:  Patient ID: [name], male    DOB: [birth year],  MRN: [id number]  Chief Complaint  Patient presents with   Plantar Warts    Painful lesion left foot   Nail Problem    Thick painful toenails    75 y.o. male returns with the above complaint. History confirmed with patient.  He is on warfarin still, the nails quickly and are very painful and  Objective:  Physical Exam: warm, good capillary refill, no trophic changes or ulcerative lesions, and normal DP and PT pulses.  He has loss of protective sensation bilaterally.  He has a submetatarsal 2 porokeratosis on the left.  Thickened elongated painful toenails with yellow discoloration and subungual debris bilaterally x10 Assessment:   1. Pain due to onychomycosis of toenails of both feet   2. Anticoagulated      Plan:  Patient was evaluated and treated and all questions answered.    All symptomatic hyperkeratoses were safely debrided with a sterile #15 blade to patient's level of comfort without incident. We discussed preventative and palliative care of these lesions including supportive and accommodative shoegear, padding, prefabricated and custom molded accommodative orthoses, use of a pumice stone and lotions/creams daily.  Discussed the etiology and treatment options for the condition in detail with the patient. Educated patient on the topical and oral treatment options for mycotic nails. Recommended debridement of the nails today. Sharp and mechanical debridement performed of all painful and mycotic nails today. Nails debrided in length and thickness using a nail nipper to level of comfort. Discussed treatment options including appropriate shoe gear. Follow up as needed for painful nails.    No follow-ups on file.

## 2021-08-14 DIAGNOSIS — M5022 Other cervical disc displacement, mid-cervical region, unspecified level: Secondary | ICD-10-CM | POA: Diagnosis not present

## 2021-08-14 DIAGNOSIS — M5032 Other cervical disc degeneration, mid-cervical region, unspecified level: Secondary | ICD-10-CM | POA: Diagnosis not present

## 2021-08-14 DIAGNOSIS — M9902 Segmental and somatic dysfunction of thoracic region: Secondary | ICD-10-CM | POA: Diagnosis not present

## 2021-08-14 DIAGNOSIS — M9903 Segmental and somatic dysfunction of lumbar region: Secondary | ICD-10-CM | POA: Diagnosis not present

## 2021-08-14 DIAGNOSIS — M9901 Segmental and somatic dysfunction of cervical region: Secondary | ICD-10-CM | POA: Diagnosis not present

## 2021-08-14 DIAGNOSIS — M5124 Other intervertebral disc displacement, thoracic region: Secondary | ICD-10-CM | POA: Diagnosis not present

## 2021-08-19 DIAGNOSIS — M5124 Other intervertebral disc displacement, thoracic region: Secondary | ICD-10-CM | POA: Diagnosis not present

## 2021-08-19 DIAGNOSIS — M9903 Segmental and somatic dysfunction of lumbar region: Secondary | ICD-10-CM | POA: Diagnosis not present

## 2021-08-19 DIAGNOSIS — M5022 Other cervical disc displacement, mid-cervical region, unspecified level: Secondary | ICD-10-CM | POA: Diagnosis not present

## 2021-08-19 DIAGNOSIS — M9901 Segmental and somatic dysfunction of cervical region: Secondary | ICD-10-CM | POA: Diagnosis not present

## 2021-08-19 DIAGNOSIS — M5032 Other cervical disc degeneration, mid-cervical region, unspecified level: Secondary | ICD-10-CM | POA: Diagnosis not present

## 2021-08-19 DIAGNOSIS — M9902 Segmental and somatic dysfunction of thoracic region: Secondary | ICD-10-CM | POA: Diagnosis not present

## 2021-08-21 DIAGNOSIS — M5124 Other intervertebral disc displacement, thoracic region: Secondary | ICD-10-CM | POA: Diagnosis not present

## 2021-08-21 DIAGNOSIS — M5032 Other cervical disc degeneration, mid-cervical region, unspecified level: Secondary | ICD-10-CM | POA: Diagnosis not present

## 2021-08-21 DIAGNOSIS — M9901 Segmental and somatic dysfunction of cervical region: Secondary | ICD-10-CM | POA: Diagnosis not present

## 2021-08-21 DIAGNOSIS — M9903 Segmental and somatic dysfunction of lumbar region: Secondary | ICD-10-CM | POA: Diagnosis not present

## 2021-08-21 DIAGNOSIS — M5022 Other cervical disc displacement, mid-cervical region, unspecified level: Secondary | ICD-10-CM | POA: Diagnosis not present

## 2021-08-21 DIAGNOSIS — M9902 Segmental and somatic dysfunction of thoracic region: Secondary | ICD-10-CM | POA: Diagnosis not present

## 2021-08-26 DIAGNOSIS — M9902 Segmental and somatic dysfunction of thoracic region: Secondary | ICD-10-CM | POA: Diagnosis not present

## 2021-08-26 DIAGNOSIS — M5124 Other intervertebral disc displacement, thoracic region: Secondary | ICD-10-CM | POA: Diagnosis not present

## 2021-08-26 DIAGNOSIS — M5032 Other cervical disc degeneration, mid-cervical region, unspecified level: Secondary | ICD-10-CM | POA: Diagnosis not present

## 2021-08-26 DIAGNOSIS — M9903 Segmental and somatic dysfunction of lumbar region: Secondary | ICD-10-CM | POA: Diagnosis not present

## 2021-08-26 DIAGNOSIS — M9901 Segmental and somatic dysfunction of cervical region: Secondary | ICD-10-CM | POA: Diagnosis not present

## 2021-08-26 DIAGNOSIS — M5022 Other cervical disc displacement, mid-cervical region, unspecified level: Secondary | ICD-10-CM | POA: Diagnosis not present

## 2021-08-27 ENCOUNTER — Other Ambulatory Visit (HOSPITAL_BASED_OUTPATIENT_CLINIC_OR_DEPARTMENT_OTHER): Payer: Self-pay

## 2021-08-28 DIAGNOSIS — M5032 Other cervical disc degeneration, mid-cervical region, unspecified level: Secondary | ICD-10-CM | POA: Diagnosis not present

## 2021-08-28 DIAGNOSIS — M9903 Segmental and somatic dysfunction of lumbar region: Secondary | ICD-10-CM | POA: Diagnosis not present

## 2021-08-28 DIAGNOSIS — M5022 Other cervical disc displacement, mid-cervical region, unspecified level: Secondary | ICD-10-CM | POA: Diagnosis not present

## 2021-08-28 DIAGNOSIS — M9902 Segmental and somatic dysfunction of thoracic region: Secondary | ICD-10-CM | POA: Diagnosis not present

## 2021-08-28 DIAGNOSIS — M9901 Segmental and somatic dysfunction of cervical region: Secondary | ICD-10-CM | POA: Diagnosis not present

## 2021-08-28 DIAGNOSIS — M5124 Other intervertebral disc displacement, thoracic region: Secondary | ICD-10-CM | POA: Diagnosis not present

## 2021-08-29 ENCOUNTER — Ambulatory Visit (INDEPENDENT_AMBULATORY_CARE_PROVIDER_SITE_OTHER): Payer: Medicare Other | Admitting: *Deleted

## 2021-08-29 ENCOUNTER — Other Ambulatory Visit: Payer: Self-pay

## 2021-08-29 DIAGNOSIS — D6859 Other primary thrombophilia: Secondary | ICD-10-CM

## 2021-08-29 DIAGNOSIS — Z5181 Encounter for therapeutic drug level monitoring: Secondary | ICD-10-CM

## 2021-08-29 LAB — POCT INR: INR: 2.5 (ref 2.0–3.0)

## 2021-08-29 NOTE — Patient Instructions (Signed)
Description   Continue taking 1 tablet daily except for 1/2 tablet on Mondays, Wednesdays and Fridays.  Recheck INR in 5 weeks. Call us with any new medications or concerns [phone] [facility], Main # [phone].

## 2021-09-02 ENCOUNTER — Other Ambulatory Visit: Payer: Self-pay | Admitting: Family Medicine

## 2021-09-02 DIAGNOSIS — I10 Essential (primary) hypertension: Secondary | ICD-10-CM

## 2021-09-03 DIAGNOSIS — M5124 Other intervertebral disc displacement, thoracic region: Secondary | ICD-10-CM | POA: Diagnosis not present

## 2021-09-03 DIAGNOSIS — M5032 Other cervical disc degeneration, mid-cervical region, unspecified level: Secondary | ICD-10-CM | POA: Diagnosis not present

## 2021-09-03 DIAGNOSIS — M9903 Segmental and somatic dysfunction of lumbar region: Secondary | ICD-10-CM | POA: Diagnosis not present

## 2021-09-03 DIAGNOSIS — M9902 Segmental and somatic dysfunction of thoracic region: Secondary | ICD-10-CM | POA: Diagnosis not present

## 2021-09-03 DIAGNOSIS — M9901 Segmental and somatic dysfunction of cervical region: Secondary | ICD-10-CM | POA: Diagnosis not present

## 2021-09-03 DIAGNOSIS — M5022 Other cervical disc displacement, mid-cervical region, unspecified level: Secondary | ICD-10-CM | POA: Diagnosis not present

## 2021-09-04 DIAGNOSIS — M9903 Segmental and somatic dysfunction of lumbar region: Secondary | ICD-10-CM | POA: Diagnosis not present

## 2021-09-04 DIAGNOSIS — M9902 Segmental and somatic dysfunction of thoracic region: Secondary | ICD-10-CM | POA: Diagnosis not present

## 2021-09-04 DIAGNOSIS — M9901 Segmental and somatic dysfunction of cervical region: Secondary | ICD-10-CM | POA: Diagnosis not present

## 2021-09-04 DIAGNOSIS — M5022 Other cervical disc displacement, mid-cervical region, unspecified level: Secondary | ICD-10-CM | POA: Diagnosis not present

## 2021-09-04 DIAGNOSIS — M5124 Other intervertebral disc displacement, thoracic region: Secondary | ICD-10-CM | POA: Diagnosis not present

## 2021-09-04 DIAGNOSIS — M5032 Other cervical disc degeneration, mid-cervical region, unspecified level: Secondary | ICD-10-CM | POA: Diagnosis not present

## 2021-09-09 DIAGNOSIS — M9903 Segmental and somatic dysfunction of lumbar region: Secondary | ICD-10-CM | POA: Diagnosis not present

## 2021-09-09 DIAGNOSIS — M5124 Other intervertebral disc displacement, thoracic region: Secondary | ICD-10-CM | POA: Diagnosis not present

## 2021-09-09 DIAGNOSIS — M5032 Other cervical disc degeneration, mid-cervical region, unspecified level: Secondary | ICD-10-CM | POA: Diagnosis not present

## 2021-09-09 DIAGNOSIS — M5022 Other cervical disc displacement, mid-cervical region, unspecified level: Secondary | ICD-10-CM | POA: Diagnosis not present

## 2021-09-09 DIAGNOSIS — M9902 Segmental and somatic dysfunction of thoracic region: Secondary | ICD-10-CM | POA: Diagnosis not present

## 2021-09-09 DIAGNOSIS — M9901 Segmental and somatic dysfunction of cervical region: Secondary | ICD-10-CM | POA: Diagnosis not present

## 2021-09-11 ENCOUNTER — Other Ambulatory Visit (HOSPITAL_BASED_OUTPATIENT_CLINIC_OR_DEPARTMENT_OTHER): Payer: Self-pay

## 2021-09-11 DIAGNOSIS — M9901 Segmental and somatic dysfunction of cervical region: Secondary | ICD-10-CM | POA: Diagnosis not present

## 2021-09-11 DIAGNOSIS — M9902 Segmental and somatic dysfunction of thoracic region: Secondary | ICD-10-CM | POA: Diagnosis not present

## 2021-09-11 DIAGNOSIS — M5022 Other cervical disc displacement, mid-cervical region, unspecified level: Secondary | ICD-10-CM | POA: Diagnosis not present

## 2021-09-11 DIAGNOSIS — M9903 Segmental and somatic dysfunction of lumbar region: Secondary | ICD-10-CM | POA: Diagnosis not present

## 2021-09-11 DIAGNOSIS — M5032 Other cervical disc degeneration, mid-cervical region, unspecified level: Secondary | ICD-10-CM | POA: Diagnosis not present

## 2021-09-11 DIAGNOSIS — M5124 Other intervertebral disc displacement, thoracic region: Secondary | ICD-10-CM | POA: Diagnosis not present

## 2021-09-16 ENCOUNTER — Ambulatory Visit (INDEPENDENT_AMBULATORY_CARE_PROVIDER_SITE_OTHER): Payer: Medicare Other | Admitting: Podiatry

## 2021-09-16 ENCOUNTER — Other Ambulatory Visit: Payer: Self-pay

## 2021-09-16 DIAGNOSIS — M79674 Pain in right toe(s): Secondary | ICD-10-CM

## 2021-09-16 DIAGNOSIS — Z7901 Long term (current) use of anticoagulants: Secondary | ICD-10-CM | POA: Diagnosis not present

## 2021-09-16 DIAGNOSIS — M79675 Pain in left toe(s): Secondary | ICD-10-CM

## 2021-09-16 DIAGNOSIS — B351 Tinea unguium: Secondary | ICD-10-CM

## 2021-09-16 NOTE — Progress Notes (Signed)
  Subjective:  Patient ID: [name], male    DOB: [birth year],  MRN: [id number]  Chief Complaint  Patient presents with   Diabetes    6 week follow up, at risk foot care    75 y.o. male returns with the above complaint. History confirmed with patient.  He is on warfarin still using the ciclopirox, the aperture pads that he got on Scissors have helped quite a bit  Objective:  Physical Exam: warm, good capillary refill, no trophic changes or ulcerative lesions, and normal DP and PT pulses.  He has loss of protective sensation bilaterally.  Submetatarsal 2 porokeratosis no longer present thickened elongated painful toenails with yellow discoloration and subungual debris bilaterally x10 Assessment:   1. Pain due to onychomycosis of toenails of both feet   2. Anticoagulated      Plan:  Patient was evaluated and treated and all questions answered.  Aperture pad seem to have alleviated his porokeratosis completely, continue using these  Discussed the etiology and treatment options for the condition in detail with the patient. Educated patient on the topical and oral treatment options for mycotic nails. Recommended debridement of the nails today. Sharp and mechanical debridement performed of all painful and mycotic nails today. Nails debrided in length and thickness using a nail nipper to level of comfort. Discussed treatment options including appropriate shoe gear. Follow up as needed for painful nails.  Continue ciclopirox.  I will see him back every 6 weeks for now    Return in about 6 weeks (around 10/28/2021) for painful nails.

## 2021-09-20 ENCOUNTER — Other Ambulatory Visit (HOSPITAL_BASED_OUTPATIENT_CLINIC_OR_DEPARTMENT_OTHER): Payer: Self-pay

## 2021-09-23 DIAGNOSIS — M9901 Segmental and somatic dysfunction of cervical region: Secondary | ICD-10-CM | POA: Diagnosis not present

## 2021-09-23 DIAGNOSIS — M9902 Segmental and somatic dysfunction of thoracic region: Secondary | ICD-10-CM | POA: Diagnosis not present

## 2021-09-23 DIAGNOSIS — M5124 Other intervertebral disc displacement, thoracic region: Secondary | ICD-10-CM | POA: Diagnosis not present

## 2021-09-23 DIAGNOSIS — M5022 Other cervical disc displacement, mid-cervical region, unspecified level: Secondary | ICD-10-CM | POA: Diagnosis not present

## 2021-09-23 DIAGNOSIS — M5032 Other cervical disc degeneration, mid-cervical region, unspecified level: Secondary | ICD-10-CM | POA: Diagnosis not present

## 2021-09-23 DIAGNOSIS — M9903 Segmental and somatic dysfunction of lumbar region: Secondary | ICD-10-CM | POA: Diagnosis not present

## 2021-10-01 ENCOUNTER — Telehealth: Payer: Self-pay | Admitting: Neurology

## 2021-10-01 NOTE — Telephone Encounter (Signed)
Pt is sch for 10-11-21 with [name]

## 2021-10-01 NOTE — Telephone Encounter (Signed)
Pt son advised of Dr.Jaffe note.  Patient should first follow up with PCP to rule out UTI.  He can make an appointment with [name] in the memory clinic for re-evaluation.

## 2021-10-01 NOTE — Telephone Encounter (Signed)
Pt son daniel called in to let jaffe know his father has developed hallucinations. He is seeing people inside the house, expressions on picture frames, and said this was quite disturbing. Would like a call to discuss

## 2021-10-01 NOTE — Telephone Encounter (Signed)
Spoke to the patient son, Pt had the hallinations since yesterday.  Patient was at home alone Started yesterday late afternoon.  Pt is concerned.

## 2021-10-02 ENCOUNTER — Other Ambulatory Visit: Payer: Self-pay

## 2021-10-02 ENCOUNTER — Encounter: Payer: Self-pay | Admitting: Family Medicine

## 2021-10-02 ENCOUNTER — Ambulatory Visit (INDEPENDENT_AMBULATORY_CARE_PROVIDER_SITE_OTHER): Payer: Medicare Other | Admitting: Family Medicine

## 2021-10-02 VITALS — BP 138/70 | HR 70 | Resp 16 | Ht 75.0 in | Wt 186.0 lb

## 2021-10-02 DIAGNOSIS — R399 Unspecified symptoms and signs involving the genitourinary system: Secondary | ICD-10-CM

## 2021-10-02 DIAGNOSIS — M9902 Segmental and somatic dysfunction of thoracic region: Secondary | ICD-10-CM | POA: Diagnosis not present

## 2021-10-02 DIAGNOSIS — M9901 Segmental and somatic dysfunction of cervical region: Secondary | ICD-10-CM | POA: Diagnosis not present

## 2021-10-02 DIAGNOSIS — M5032 Other cervical disc degeneration, mid-cervical region, unspecified level: Secondary | ICD-10-CM | POA: Diagnosis not present

## 2021-10-02 DIAGNOSIS — E559 Vitamin D deficiency, unspecified: Secondary | ICD-10-CM | POA: Diagnosis not present

## 2021-10-02 DIAGNOSIS — I25118 Atherosclerotic heart disease of native coronary artery with other forms of angina pectoris: Secondary | ICD-10-CM

## 2021-10-02 DIAGNOSIS — M159 Polyosteoarthritis, unspecified: Secondary | ICD-10-CM | POA: Diagnosis not present

## 2021-10-02 DIAGNOSIS — M5124 Other intervertebral disc displacement, thoracic region: Secondary | ICD-10-CM | POA: Diagnosis not present

## 2021-10-02 DIAGNOSIS — M9903 Segmental and somatic dysfunction of lumbar region: Secondary | ICD-10-CM | POA: Diagnosis not present

## 2021-10-02 DIAGNOSIS — R441 Visual hallucinations: Secondary | ICD-10-CM | POA: Diagnosis not present

## 2021-10-02 DIAGNOSIS — I10 Essential (primary) hypertension: Secondary | ICD-10-CM | POA: Diagnosis not present

## 2021-10-02 DIAGNOSIS — D6859 Other primary thrombophilia: Secondary | ICD-10-CM

## 2021-10-02 DIAGNOSIS — M5022 Other cervical disc displacement, mid-cervical region, unspecified level: Secondary | ICD-10-CM | POA: Diagnosis not present

## 2021-10-02 LAB — POCT URINALYSIS DIPSTICK
Bilirubin, UA: NEGATIVE
Blood, UA: NEGATIVE
Glucose, UA: NEGATIVE
Ketones, UA: NEGATIVE
Leukocytes, UA: NEGATIVE
Nitrite, UA: NEGATIVE
Protein, UA: NEGATIVE
Spec Grav, UA: 1.025 (ref 1.010–1.025)
Urobilinogen, UA: 1 E.U./dL
pH, UA: 6 (ref 5.0–8.0)

## 2021-10-02 LAB — CBC WITH DIFFERENTIAL/PLATELET
Basophils Absolute: 0 10*3/uL (ref 0.0–0.1)
Basophils Relative: 0.7 % (ref 0.0–3.0)
Eosinophils Absolute: 0 10*3/uL (ref 0.0–0.7)
Eosinophils Relative: 1 % (ref 0.0–5.0)
HCT: 42.3 % (ref 39.0–52.0)
Hemoglobin: 14.2 g/dL (ref 13.0–17.0)
Lymphocytes Relative: 18.5 % (ref 12.0–46.0)
Lymphs Abs: 0.8 10*3/uL (ref 0.7–4.0)
MCHC: 33.6 g/dL (ref 30.0–36.0)
MCV: 93.8 fl (ref 78.0–100.0)
Monocytes Absolute: 0.5 10*3/uL (ref 0.1–1.0)
Monocytes Relative: 10.4 % (ref 3.0–12.0)
Neutro Abs: 3 10*3/uL (ref 1.4–7.7)
Neutrophils Relative %: 69.4 % (ref 43.0–77.0)
Platelets: 236 10*3/uL (ref 150.0–400.0)
RBC: 4.51 Mil/uL (ref 4.22–5.81)
RDW: 15 % (ref 11.5–15.5)
WBC: 4.4 10*3/uL (ref 4.0–10.5)

## 2021-10-02 LAB — BASIC METABOLIC PANEL
BUN: 15 mg/dL (ref 6–23)
CO2: 28 mEq/L (ref 19–32)
Calcium: 10.8 mg/dL — ABNORMAL HIGH (ref 8.4–10.5)
Chloride: 107 mEq/L (ref 96–112)
Creatinine, Ser: 0.94 mg/dL (ref 0.40–1.50)
GFR: 80.19 mL/min (ref >60.00)
Glucose, Bld: 123 mg/dL — ABNORMAL HIGH (ref 70–99)
Potassium: 3.6 mEq/L (ref 3.5–5.1)
Sodium: 141 mEq/L (ref 135–145)

## 2021-10-02 LAB — C-REACTIVE PROTEIN: CRP: <1 mg/dL (ref 0.5–20.0)

## 2021-10-02 LAB — TSH: TSH: 1.4 u[IU]/mL (ref 0.35–5.50)

## 2021-10-02 LAB — VITAMIN D 25 HYDROXY (VIT D DEFICIENCY, FRACTURES): VITD: 56.55 ng/mL (ref 30.00–100.00)

## 2021-10-02 NOTE — Patient Instructions (Addendum)
A few things to remember from today's visit:   UTI symptoms - Plan: POCT urinalysis dipstick, Culture, Urine  Essential hypertension - Plan: Basic metabolic panel, TSH  Generalized osteoarthrosis, involving multiple sites  Hallucination, visual - Plan: CBC with Differential/Platelet, Basic metabolic panel, C-reactive protein, TSH  Vitamin D deficiency, unspecified - Plan: VITAMIN D 25 Hydroxy (Vit-D Deficiency, Fractures)  If you need refills please call your pharmacy. Do not use My Chart to request refills or for acute issues that need immediate attention.   No changes in medications today. Monitor for new symptoms. Keep appt with neuro next week.  Continue tramadol daily as needed. Fall precautions.  If problem gets worse or you develop agitation,mental status changes,falls,headache,fever you need to go to the ER  Please be sure medication list is accurate. If a new problem present, please set up appointment sooner than planned today.

## 2021-10-02 NOTE — Progress Notes (Signed)
Chief Complaint  Patient presents with   Urinary Tract Infection    Visual hallucinations. No new urinary symptoms.   HPI: [name] is a 75 y.o. male with hx of OA,CAD,unstable gait,hypercoagulable state on chronic anticoagulation here today with his wife complaining of having visual hallucinations for the past 2-3 days. His wife was out of town, calling him 2 times daily, back last night. See people on pictures smiling and moving.   Also saw furniture moving and heard movement in the house. According to wife,he was c/o "not feeling right." He states that he is feeling better now.  Denies dysuria. Urinary frequency and urgency for years.  Intermittent lower abdominal pain for months, stable. He has not identified exacerbating or alleviating factors. According to pt, it is thought to be related to back pain (radiated pain).. No changes in bowel habits, last bowel movement today. He has not had falls.  He has seen neurologist since his last visit. OFB:PZWCHEN on Aricept 10 mg a few months ago.  Negative for new medications or new stressors. Denies depressed mood or anxiety.  According to his wife,he mentioned that he was not sleeping well, 1-2 hours. He is sleeping on the couch because back pain, he has been doing so for months.  Negative for fever,chills,changes in appetite, cough,wheezing,SOB,or focal neurologic/MS changes. Occasionally chest soreness after working outdoors, yard work.  HTN: Home BP readings 130's/80's. Since his last visit he has seen cardiologist. Hypertension: Currently he is on losartan 100 mg daily and amlodipine 5 mg daily.  Eye exam a few months ago.  He is on B12 supplementation. Lab Results  Component Value Date   [id number] 614 11/21/2020    Vit D def: He is on vit D supplementation,1000 U daily. 25 OH vit D 54 in 11/2020.  He was last seen on 10/24/2020 for generalized OA. Chronic pain: Upper and lower back and  arthralgias. He is on Tramadol 50 mg, which he takes occasionally. Medication helps with pain and it has been well tolerated. Pain is exacerbated by prolonged walking standing and with chores around the house. He sees chiropractor regularly.  Review of Systems  Constitutional:  Positive for fatigue. Negative for activity change and unexpected weight change.  Cardiovascular:  Negative for chest pain and palpitations.  Genitourinary:  Negative for decreased urine volume and hematuria.  Musculoskeletal:  Positive for arthralgias, back pain and gait problem.  Skin:  Negative for pallor and rash.  Neurological:  Negative for syncope, speech difficulty, weakness and headaches.  Psychiatric/Behavioral:  Positive for sleep disturbance. Negative for agitation and dysphoric mood.   Rest see pertinent positives and negatives per HPI.  Current Outpatient Medications on File Prior to Visit  Medication Sig Dispense Refill   acetaminophen (TYLENOL) 500 MG tablet Take 1,000 mg by mouth every 6 (six) hours as needed for moderate pain or headache.     amLODipine (NORVASC) 5 MG tablet TAKE ONE AND ONE-HALF TABLETS DAILY 135 tablet 1   cholecalciferol (VITAMIN D) 1000 units tablet Take 1,000 Units by mouth daily.      ciclopirox (PENLAC) 8 % solution Apply topically at bedtime. Apply over nail and surrounding skin. Apply daily over previous coat. After seven (7) days, may remove with alcohol and continue cycle. 6.6 mL 6   COVID-19 mRNA Vac-TriS, Pfizer, (PFIZER-BIONT COVID-19 VAC-TRIS) SUSP injection Inject into the muscle. 0.3 mL 0   Cyanocobalamin (VITAMIN B12) 500 MCG TABS Take 500 mcg by mouth daily.  donepezil (ARICEPT) 10 MG tablet Take 1 tablet (10 mg total) by mouth at bedtime. 90 tablet 1   ezetimibe (ZETIA) 10 MG tablet TAKE 1 TABLET AT BEDTIME 90 tablet 3   fenofibrate (TRICOR) 145 MG tablet TAKE 1 TABLET AT BEDTIME 90 tablet 3   furosemide (LASIX) 20 MG tablet Take 1 tablet (20 mg total) by  mouth daily. May take an extra dose 20mg  twice a week for swelling 90 tablet 3   hydrOXYzine (ATARAX/VISTARIL) 25 MG tablet Take 1 tablet (25 mg total) by mouth daily as needed for anxiety (daily as needed before flying.). 30 tablet 0   losartan (COZAAR) 100 MG tablet TAKE 1 TABLET DAILY 90 tablet 3   nitroGLYCERIN (NITROSTAT) 0.4 MG SL tablet Place 1 tablet (0.4 mg total) under the tongue every 5 (five) minutes as needed for chest pain. 75 tablet 2   Olopatadine HCl (PATADAY OP) Place 1 drop into both eyes daily as needed (allergies).     rosuvastatin (CRESTOR) 40 MG tablet Take 1 tablet (40 mg total) by mouth daily. 90 tablet 3   traMADol (ULTRAM) 50 MG tablet Take 1 tablet (50 mg total) by mouth daily as needed. 30 tablet 0   warfarin (COUMADIN) 5 MG tablet TAKE AS DIRECTED BY COUMADIN CLINIC 80 tablet 3   No current facility-administered medications on file prior to visit.   Past Medical History:  Diagnosis Date   Allergy    Arthritis    CAD (coronary artery disease) 1996   POST CABG   Cataracts, bilateral    Chicken pox    Dyslipidemia    Hyperlipidemia    Hypertension    Sinus bradycardia    Allergies  Allergen Reactions   Other     Environmental allergies   Latex     Itchy hands    Social History   Socioeconomic History   Marital status: Married    Spouse name: [name]   Number of children: 2   Years of education: COLLEGE   Highest education level: Not on file  Occupational History   Occupation: RETIRED  Tobacco Use   Smoking status: Former    Types: Cigarettes    Quit date: 05/29/1982    Years since quitting: 39.3   Smokeless tobacco: Never  Substance and Sexual Activity   Alcohol use: Yes    Comment: 0-2 drinks a night   Drug use: No   Sexual activity: Not on file  Other Topics Concern   Not on file  Social History Narrative   Right handed   Social Determinants of Health   Financial Resource Strain: Low Risk    Difficulty of Paying Living Expenses:  Not hard at all  Food Insecurity: No Food Insecurity   Worried About Charity fundraiser in the Last Year: Never true   [place] in the Last Year: Never true  Transportation Needs: No Transportation Needs   Lack of Transportation (Medical): No   Lack of Transportation (Non-Medical): No  Physical Activity: Inactive   Days of Exercise per Week: 0 days   Minutes of Exercise per Session: 0 min  Stress: No Stress Concern Present   Feeling of Stress : Not at all  Social Connections: Moderately Isolated   Frequency of Communication with Friends and Family: More than three times a week   Frequency of Social Gatherings with Friends and Family: Once a week   Attends Religious Services: Never   Marine scientist or Organizations:  No   Attends Club or Organization Meetings: Never   Marital Status: Married   Vitals:   10/02/21 1204  BP: 138/70  Pulse: 70  Resp: 16  SpO2: 97%   Body mass index is 23.25 kg/m.  Physical Exam Nursing note reviewed.  Constitutional:      General: He is not in acute distress.    Appearance: He is well-developed.  HENT:     Head: Normocephalic and atraumatic.  Eyes:     Conjunctiva/sclera: Conjunctivae normal.  Cardiovascular:     Rate and Rhythm: Normal rate and regular rhythm.     Heart sounds: No murmur heard.    Comments: DP pulses present. Peri ankle not pitting edema,bilateral. Pulmonary:     Effort: Pulmonary effort is normal. No respiratory distress.     Breath sounds: Normal breath sounds.  Abdominal:     Palpations: Abdomen is soft. There is no mass.     Tenderness: There is no abdominal tenderness.  Musculoskeletal:     Cervical back: No tenderness. Decreased range of motion.     Thoracic back: No tenderness or bony tenderness.     Lumbar back: No tenderness. Decreased range of motion.     Comments: Kyphosis.  Lymphadenopathy:     Cervical: No cervical adenopathy.  Skin:    General: Skin is warm.     Findings: No erythema  or rash.  Neurological:     Mental Status: He is alert. Mental status is at baseline.     Cranial Nerves: No cranial nerve deficit.     Motor: No tremor.     Comments: Unstable gait assisted with a cane. Oriented x 3.  Psychiatric:     Comments: Well groomed, good eye contact.   ASSESSMENT AND PLAN:  Mr.Jilberto was seen today for urinary tract infection.  Diagnoses and all orders for this visit: Orders Placed This Encounter  Procedures   Culture, Urine   CBC with Differential/Platelet   Basic metabolic panel   C-reactive protein   TSH   VITAMIN D 25 Hydroxy (Vit-D Deficiency, Fractures)   POCT urinalysis dipstick   Lab Results  Component Value Date   TSH 1.40 10/02/2021   Lab Results  Component Value Date   CREATININE 0.94 10/02/2021   BUN 15 10/02/2021   NA 141 10/02/2021   K 3.6 10/02/2021   CL 107 10/02/2021   CO2 28 10/02/2021   Lab Results  Component Value Date   CRP <1.0 10/02/2021   Lab Results  Component Value Date   WBC 4.4 10/02/2021   HGB 14.2 10/02/2021   HCT 42.3 10/02/2021   MCV 93.8 10/02/2021   PLT 236.0 10/02/2021   Generalized osteoarthrosis, involving multiple sites We discussed current guidelines for chronic pain management with opioid/opioid like emdciation. He does not take Tramadol frequently and it has helped. Continue same dose of Tramadol. [place] controlled substance report reviewed. He does not need a Tramadol Rx at this time, last filled on 03/25/21 # 30 tabs. Med contract signed.  UTI symptoms Frequency and urgency for years, no new urinary symptom. Urine dipstick today negative. Will send urine for Cx and will give recommendations accordingly.  Essential hypertension BP adequately controlled. Continue Losartan 100 mg daily and Amlodipine 5 mg daily. Continue monitoring BP at home and following low salt diet.  Hallucination, visual We discussed possible etiologies. Infectious process is in the differential. No new urinary  symptoms,urine dipstick clear. Lack of sleep and changes in routine (he was alone  fpr 10 days) could be contributing factors. Examination today do not suggest a serious process. He agrees on holding on abx for now. Adequate hydration. Monitor for new symptoms. Instructed about warning signs. Keep next appt with neuro. He has a brain MRI order placed by neuro.  Vitamin D deficiency, unspecified Continue Vit D 1000 U daily, further recommendations according to 25 OH vit D result.  Primary hypercoagulable state ([place]) On chronic anticoagulation, Coumadin.  I spent a total of 48 minutes in both face to face and non face to face activities for this visit on the date of this encounter. During this time history was obtained and documented, examination was performed, prior labs reviewed, and assessment/plan discussed.  Return in about 5 months (around 03/02/2022).  Linde Wilensky G. Martinique, MD  [facility]. [place] office.

## 2021-10-03 ENCOUNTER — Ambulatory Visit (INDEPENDENT_AMBULATORY_CARE_PROVIDER_SITE_OTHER): Payer: Medicare Other

## 2021-10-03 DIAGNOSIS — D6859 Other primary thrombophilia: Secondary | ICD-10-CM | POA: Diagnosis not present

## 2021-10-03 DIAGNOSIS — Z5181 Encounter for therapeutic drug level monitoring: Secondary | ICD-10-CM

## 2021-10-03 LAB — URINE CULTURE
MICRO NUMBER:: 12554497
SPECIMEN QUALITY:: ADEQUATE

## 2021-10-03 LAB — POCT INR: INR: 2.5 (ref 2.0–3.0)

## 2021-10-03 NOTE — Patient Instructions (Signed)
Description   Continue taking 1 tablet daily except for 1/2 tablet on Mondays, Wednesdays and Fridays.  Recheck INR in 6 weeks. Call us with any new medications or concerns [phone] [facility], Main # [phone].

## 2021-10-04 ENCOUNTER — Encounter: Payer: Self-pay | Admitting: Family Medicine

## 2021-10-04 ENCOUNTER — Ambulatory Visit: Payer: Medicare Other

## 2021-10-06 MED ORDER — SULFAMETHOXAZOLE-TRIMETHOPRIM 800-160 MG PO TABS: 1.0000 | ORAL_TABLET | Freq: Two times a day (BID) | ORAL | 0 refills | Status: AC

## 2021-10-07 ENCOUNTER — Other Ambulatory Visit: Payer: Self-pay | Admitting: Interventional Cardiology

## 2021-10-07 ENCOUNTER — Telehealth: Payer: Self-pay | Admitting: Interventional Cardiology

## 2021-10-07 DIAGNOSIS — M9903 Segmental and somatic dysfunction of lumbar region: Secondary | ICD-10-CM | POA: Diagnosis not present

## 2021-10-07 DIAGNOSIS — M9902 Segmental and somatic dysfunction of thoracic region: Secondary | ICD-10-CM | POA: Diagnosis not present

## 2021-10-07 DIAGNOSIS — M5032 Other cervical disc degeneration, mid-cervical region, unspecified level: Secondary | ICD-10-CM | POA: Diagnosis not present

## 2021-10-07 DIAGNOSIS — M5022 Other cervical disc displacement, mid-cervical region, unspecified level: Secondary | ICD-10-CM | POA: Diagnosis not present

## 2021-10-07 DIAGNOSIS — M5124 Other intervertebral disc displacement, thoracic region: Secondary | ICD-10-CM | POA: Diagnosis not present

## 2021-10-07 DIAGNOSIS — M9901 Segmental and somatic dysfunction of cervical region: Secondary | ICD-10-CM | POA: Diagnosis not present

## 2021-10-07 NOTE — Telephone Encounter (Signed)
FYI--Patient's wife called and scheduled an INR check for 11/03 at 9:15 AM. She states patient's PCP started him on a new antibiotic and she would like to have his INR checked.

## 2021-10-10 ENCOUNTER — Ambulatory Visit (INDEPENDENT_AMBULATORY_CARE_PROVIDER_SITE_OTHER): Payer: Medicare Other | Admitting: *Deleted

## 2021-10-10 ENCOUNTER — Other Ambulatory Visit: Payer: Self-pay

## 2021-10-10 DIAGNOSIS — D6859 Other primary thrombophilia: Secondary | ICD-10-CM | POA: Diagnosis not present

## 2021-10-10 DIAGNOSIS — Z5181 Encounter for therapeutic drug level monitoring: Secondary | ICD-10-CM | POA: Diagnosis not present

## 2021-10-10 LAB — POCT INR: INR: 1.7 — AB (ref 2.0–3.0)

## 2021-10-10 NOTE — Patient Instructions (Signed)
Description    Take 1.5 tablets of warfarin today and then continue to take 1 tablet daily except for 1/2 a tablet on Mondays, Wednesday and Fridays. Recheck INR in 5 days (usually 6 weeks). [facility] [phone]

## 2021-10-11 ENCOUNTER — Encounter: Payer: Self-pay | Admitting: Physician Assistant

## 2021-10-11 ENCOUNTER — Ambulatory Visit (INDEPENDENT_AMBULATORY_CARE_PROVIDER_SITE_OTHER): Payer: Medicare Other | Admitting: Physician Assistant

## 2021-10-11 VITALS — BP 149/76 | HR 88 | Resp 18 | Ht 75.0 in | Wt 187.0 lb

## 2021-10-11 DIAGNOSIS — F0670 Mild neurocognitive disorder due to known physiological condition without behavioral disturbance: Secondary | ICD-10-CM | POA: Diagnosis not present

## 2021-10-11 DIAGNOSIS — I25118 Atherosclerotic heart disease of native coronary artery with other forms of angina pectoris: Secondary | ICD-10-CM | POA: Diagnosis not present

## 2021-10-11 DIAGNOSIS — G309 Alzheimer's disease, unspecified: Secondary | ICD-10-CM | POA: Diagnosis not present

## 2021-10-11 MED ORDER — DIVALPROEX SODIUM 125 MG PO DR TAB: DELAYED_RELEASE_TABLET | ORAL | 3 refills | Status: DC

## 2021-10-11 NOTE — Progress Notes (Signed)
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  Discussed the importance of regular daily schedule with inclusion of crossword puzzles to maintain brain function.  Continue to monitor mood.  We will start Depakote 125 mg nightly for 1 week, then increase to 250 mg p.o. nightly for hallucinations.  Side effects were discussed Stay active at least 30 minutes at least 3 times a week.  Naps should be scheduled and should be no longer than 60 minutes and should not occur after 2 PM.  Mediterranean diet is recommended  Continue donepezil 10 mg daily Side effects were discussed Follow up in 3 months    Case discussed with Dr.Jaffe who agrees with the plan     Subjective:    [name] is a 75 y.o. RH male with a history of osteoarthritis, CAD, hypertension, and history of mild neurocognitive disorder likely due to Alzheimer's disease, seen today in follow up for memory loss.  His last visit was on 06/06/2021.  Slums on 12/06/2020 was 18/30.  This patient is accompanied in the office by his wife who supplements the history.  Previous records as well as any outside records available were reviewed prior to todays visit.  Patient is currently on donepezil 10 mg daily, tolerating well. Since his last visit, although the patient reports that his memory is improved, the wife denies.  She states that the patient's memory is "not as improved although his sense of humor is the same ".  He continues to repeat the same stories and asked the same questions, and does not know what he came to do to the room once he gets there.  For orientation, his wife has gotten him a clock which tells him the day and time, "seems to help ".  He denies leaving objects in unusual places.  He no longer drives.  He denies any depression, but at times he gets frustrated especially with mobility.  As recall, he has  significant kyphosis, and other spinal issues, which limits his mobility, and he cannot walk as fast as he is used to.  He denies any falls, or head trauma.  Most of the day, he spends at with his wife.  He does not enjoy doing crossword puzzles or word finding.  He does enjoy watching TV, especially trivia shows like jeopardy.  He has insomnia, occasionally he has vivid dreams, but no apparent REM behavior.  No sleepwalking.  His wife states that the hallucinations began again around the end of October.  On Sunday, October 23, "something was not right with him ", and in October 24, he was reporting that the pictures on the wall were smiling at him.  She reported to her primary care physician with these issues, and urine test was negative for UTI.  He continues to have these visual hallucinations, with pictures of family member in the wall smiling at him, or holding the grandchildren, or seen in his own children is very young, including one of his daughters walking as a flower girl which "makes him very upset ".  He also has some paranoia, stating that he feels that he is not alone, someone is manipulating to take staff from the house such as TV or other electronic equipment.  He states that this presents "get an attitude, which getting humiliating".  His wife called his son who is a Higher education careers adviser, telling him that "that says  that there is someone in the house who wants me out of here ".  His son came, verify that there is nothing wrong in the house, and "talking out of this ".  There are no hygiene concerns, he is independent of bathing and dressing.  His medications are in a pillbox, but given by his wife.  She is also in charge of the finances.  His appetite is good, although he has some weight loss, attributed to the pain in his back, which has not been well managed at this time.  He denies trouble swallowing.  She states that the quality of life is deteriorating, and he sees a chiropractor 2 times a week, to "keep  the spine more flexible ".  He used to cook very well, but "is taking him 2 times as long ".  He has not forgotten common recipes, but at times he did burn the stove, because they fire alarm did not sound. Denies headaches, double vision, dizziness, focal numbness or tingling, falls, unilateral weakness or tremors or anosmia. No history of seizures. Denies urine incontinence, he has issues with retention, followed by PCP, denies constipation or diarrhea.   HISTORY 12/06/2020: His wife has noted memory problems since 2020 that has gradually progressed.  He started having trouble calculating the checkbook and would be off by several hundreds of dollars.  He also quickly forgets conversations and would repeat questions after just minutes.  He hasn't driven for years but as a passenger he denies disorientation on familiar routes. On a recent cruise, he had trouble keeping up with the schedule and activities.  He has a system to remember to take his medications independently.  No difficulty with performing home chores or activities.  At first, it was thought to be related to his chronic back/radicular pain but now the memory problems appear to be out of proportion to being in pain.     He has family history of Alzheimer's disease on his mother's side, including his mother, aunts, uncles and grandmother.  Labs on 10/02/2021 showed TSH 1.4, urine culture negative  PREVIOUS MEDICATIONS:   CURRENT MEDICATIONS:  Outpatient Encounter Medications as of 10/11/2021  Medication Sig   acetaminophen (TYLENOL) 500 MG tablet Take 1,000 mg by mouth every 6 (six) hours as needed for moderate pain or headache.   amLODipine (NORVASC) 5 MG tablet TAKE ONE AND ONE-HALF TABLETS DAILY   cholecalciferol (VITAMIN D) 1000 units tablet Take 1,000 Units by mouth daily.    ciclopirox (PENLAC) 8 % solution Apply topically at bedtime. Apply over nail and surrounding skin. Apply daily over previous coat. After seven (7) days, may  remove with alcohol and continue cycle.   Cyanocobalamin (VITAMIN B12) 500 MCG TABS Take 500 mcg by mouth daily.    divalproex (DEPAKOTE) 125 MG DR tablet Take 1 tab at night for 1 week, then increase to 2 tablets at night   donepezil (ARICEPT) 10 MG tablet Take 1 tablet (10 mg total) by mouth at bedtime.   ezetimibe (ZETIA) 10 MG tablet TAKE 1 TABLET AT BEDTIME   fenofibrate (TRICOR) 145 MG tablet TAKE 1 TABLET AT BEDTIME   furosemide (LASIX) 20 MG tablet Take 1 tablet (20 mg total) by mouth daily. May take an extra dose 20mg  twice a week for swelling   hydrOXYzine (ATARAX/VISTARIL) 25 MG tablet Take 1 tablet (25 mg total) by mouth daily as needed for anxiety (daily as needed before flying.).   losartan (COZAAR) 100 MG tablet TAKE 1 TABLET  DAILY   nitroGLYCERIN (NITROSTAT) 0.4 MG SL tablet Place 1 tablet (0.4 mg total) under the tongue every 5 (five) minutes as needed for chest pain.   Olopatadine HCl (PATADAY OP) Place 1 drop into both eyes daily as needed (allergies).   rosuvastatin (CRESTOR) 40 MG tablet Take 1 tablet (40 mg total) by mouth daily.   sulfamethoxazole-trimethoprim (BACTRIM DS) 800-160 MG tablet Take 1 tablet by mouth 2 (two) times daily for 7 days.   traMADol (ULTRAM) 50 MG tablet Take 1 tablet (50 mg total) by mouth daily as needed.   warfarin (COUMADIN) 5 MG tablet TAKE AS DIRECTED BY [facility] CL[facility]IC   COVID-19 mRNA Vac-TriS, Pfizer, (PFIZER-BIONT COVID-19 VAC-TRIS) SUSP injection Inject into the muscle. (Patient not taking: Reported on 10/11/2021)   No facility-administered encounter medications on file as of 10/11/2021.     Objective:     PHYSICAL EXAMINATION:    VITALS:   Vitals:   10/11/21 0928  BP: (!) 149/76  Pulse: 88  Resp: 18  SpO2: 99%  Weight: 187 lb (84.8 kg)  Height: 6\' 3"  (1.905 m)    GEN:  The patient appears stated age and is in NAD. HEENT:  Normocephalic, atraumatic.   Neurological examination:  General: NAD, well-groomed, appears stated  age. Orientation: The patient is alert. Oriented to person, place and not to date Cranial nerves: There is good facial symmetry.The speech is fluent and clear. No aphasia or dysarthria. Fund of knowledge is appropriate. Recent and remote memory are impaired. Attention and concentration are reduced.  Able to name objects and repeat phrases.  Hearing is intact to conversational tone.    Sensation: Sensation is intact to light touch throughout Motor: Strength is at least antigravity x4. Tremors: none  DTR's 1/4 in UE/LE    No flowsheet data found. MMSE - Mini Mental State Exam 03/23/2018  Not completed: (No Data)    [employer] Mental Exam 12/06/2020  Weekday Correct 1  Current year 1  What state are we in? 1  Amount spent 1  Amount left 0  # of Animals 2  5 objects recall 0  Number series 2  Hour markers 0  Time correct 0  Placed X in triangle correctly 1  Largest Figure 1  Name of male 2  Date back to work 2  Type of work 2  State she lived in 2  Total score 18       Movement examination: Tone: There is normal tone in the UE/LE. No cogwheeling. Pronounced kyphosis Abnormal movements:  no tremor.  No myoclonus.  No asterixis.   Coordination:  There is no decremation with RAM's. Normal finger to nose  Gait and Station: The patient has no difficulty arising out of a deep-seated chair without the use of the hands. The patient's stride length is short.  Gait is cautious and narrow.        Total time spent on today's visit was 84  minutes, including both face-to-face time and nonface-to-face time. Time included that spent on review of records (prior notes available to me/labs/imaging if pertinent), discussing treatment and goals, answering patient's questions and coordinating care.  Cc:  [place], Betty G, MD Sharene Butters, PA-C

## 2021-10-11 NOTE — Patient Instructions (Signed)
It was a pleasure to see you today at our office.   Recommendations:  Meds: Follow up in  3 months Start Depakote 125 mg  for mood/hallucinations start 1 tab at night (125 mg), then increase to 2 tabs at night (250 mg)  COntinue Aricept 10 mg daily   RECOMMENDATIONS FOR ALL PATIENTS WITH MEMORY PROBLEMS: 1. Continue to exercise (Recommend 30 minutes of walking everyday, or 3 hours every week) 2. Increase social interactions - continue going to [place] and enjoy social gatherings with friends and family 3. Eat healthy, avoid fried foods and eat more fruits and vegetables 4. Maintain adequate blood pressure, blood sugar, and blood cholesterol level. Reducing the risk of stroke and cardiovascular disease also helps promoting better memory. 5. Avoid stressful situations. Live a simple life and avoid aggravations. Organize your time and prepare for the next day in anticipation. 6. Sleep well, avoid any interruptions of sleep and avoid any distractions in the bedroom that may interfere with adequate sleep quality 7. Avoid sugar, avoid sweets as there is a strong link between excessive sugar intake, diabetes, and cognitive impairment We discussed the Mediterranean diet, which has been shown to help patients reduce the risk of progressive memory disorders and reduces cardiovascular risk. This includes eating fish, eat fruits and green leafy vegetables, nuts like almonds and hazelnuts, walnuts, and also use olive oil. Avoid fast foods and fried foods as much as possible. Avoid sweets and sugar as sugar use has been linked to worsening of memory function.  There is always a concern of gradual progression of memory problems. If this is the case, then we may need to adjust level of care according to patient needs. Support, both to the patient and caregiver, should then be put into place.    The Alzheimer's Association is here all day, every day for people facing Alzheimer's disease through our free 24/7  Helpline: [phone]. The Helpline provides reliable information and support to all those who need assistance, such as individuals living with memory loss, Alzheimer's or other dementia, caregivers, health care professionals and the public.  Our highly trained and knowledgeable staff can help you with: Understanding memory loss, dementia and Alzheimer's  Medications and other treatment options  General information about aging and brain health  Skills to provide quality care and to find the best care from professionals  Legal, financial and living-arrangement decisions Our Helpline also features: Confidential care consultation provided by master's level clinicians who can help with decision-making support, crisis assistance and education on issues families face every day  Help in a caller's preferred language using our translation service that features more than 200 languages and dialects  Referrals to local community programs, services and ongoing support     FALL PRECAUTIONS: Be cautious when walking. Scan the area for obstacles that may increase the risk of trips and falls. When getting up in the mornings, sit up at the edge of the bed for a few minutes before getting out of bed. Consider elevating the bed at the head end to avoid drop of blood pressure when getting up. Walk always in a well-lit room (use night lights in the walls). Avoid area rugs or power cords from appliances in the middle of the walkways. Use a walker or a cane if necessary and consider physical therapy for balance exercise. Get your eyesight checked regularly.  FINANCIAL OVERSIGHT: Supervision, especially oversight when making financial decisions or transactions is also recommended.  HOME SAFETY: Consider the safety of the kitchen  when operating appliances like stoves, microwave oven, and blender. Consider having supervision and share cooking responsibilities until no longer able to participate in those. Accidents with  firearms and other hazards in the house should be identified and addressed as well.   ABILITY TO BE LEFT ALONE: If patient is unable to contact 911 operator, consider using LifeLine, or when the need is there, arrange for someone to stay with patients. Smoking is a fire hazard, consider supervision or cessation. Risk of wandering should be assessed by caregiver and if detected at any point, supervision and safe proof recommendations should be instituted.  MEDICATION SUPERVISION: Inability to self-administer medication needs to be constantly addressed. Implement a mechanism to ensure safe administration of the medications.   DRIVING: Regarding driving, in patients with progressive memory problems, driving will be impaired. We advise to have someone else do the driving if trouble finding directions or if minor accidents are reported. Independent driving assessment is available to determine safety of driving.   If you are interested in the driving assessment, you can contact the following:  [employer] in [place]  [place] [phone] or [phone]      [place] refers to food and lifestyle choices that are based on the traditions of countries located on the [employer]. This way of eating has been shown to help prevent certain conditions and improve outcomes for people who have chronic diseases, like kidney disease and heart disease. What are tips for following this plan? Lifestyle  Cook and eat meals together with your family, when possible. Drink enough fluid to keep your urine clear or pale yellow. Be physically active every day. This includes: Aerobic exercise like running or swimming. Leisure activities like gardening, walking, or housework. Get 7-8 hours of sleep each night. If recommended by your health care provider,  drink red wine in moderation. This means 1 glass a day for nonpregnant women and 2 glasses a day for men. A glass of wine equals 5 oz (150 mL). Reading food labels  Check the serving size of packaged foods. For foods such as rice and pasta, the serving size refers to the amount of cooked product, not dry. Check the total fat in packaged foods. Avoid foods that have saturated fat or trans fats. Check the ingredients list for added sugars, such as corn syrup. Shopping  At the grocery store, buy most of your food from the areas near the walls of the store. This includes: Fresh fruits and vegetables (produce). Grains, beans, nuts, and seeds. Some of these may be available in unpackaged forms or large amounts (in bulk). Fresh seafood. Poultry and eggs. Low-fat dairy products. Buy whole ingredients instead of prepackaged foods. Buy fresh fruits and vegetables in-season from local farmers markets. Buy frozen fruits and vegetables in resealable bags. If you do not have access to quality fresh seafood, buy precooked frozen shrimp or canned fish, such as tuna, salmon, or sardines. Buy small amounts of raw or cooked vegetables, salads, or olives from the deli or salad bar at your store. Stock your pantry so you always have certain foods on hand, such as olive oil, canned tuna, canned tomatoes, rice, pasta, and beans. Cooking  Cook foods with extra-virgin olive oil instead of using butter or other vegetable oils. Have meat as a side dish, and have vegetables or grains as your main dish. This means having meat in small portions or  adding small amounts of meat to foods like pasta or stew. Use beans or vegetables instead of meat in common dishes like chili or lasagna. Experiment with different cooking methods. Try roasting or broiling vegetables instead of steaming or sauteing them. Add frozen vegetables to soups, stews, pasta, or rice. Add nuts or seeds for added healthy fat at each meal. You can add  these to yogurt, salads, or vegetable dishes. Marinate fish or vegetables using olive oil, lemon juice, garlic, and fresh herbs. Meal planning  Plan to eat 1 vegetarian meal one day each week. Try to work up to 2 vegetarian meals, if possible. Eat seafood 2 or more times a week. Have healthy snacks readily available, such as: Vegetable sticks with hummus. Greek yogurt. Fruit and nut trail mix. Eat balanced meals throughout the week. This includes: Fruit: 2-3 servings a day Vegetables: 4-5 servings a day Low-fat dairy: 2 servings a day Fish, poultry, or lean meat: 1 serving a day Beans and legumes: 2 or more servings a week Nuts and seeds: 1-2 servings a day Whole grains: 6-8 servings a day Extra-virgin olive oil: 3-4 servings a day Limit red meat and sweets to only a few servings a month What are my food choices? Mediterranean diet Recommended Grains: Whole-grain pasta. Brown rice. Bulgar wheat. Polenta. Couscous. Whole-wheat bread. [place]. Vegetables: Artichokes. Beets. Broccoli. Cabbage. Carrots. Eggplant. Green beans. Chard. Kale. Spinach. Onions. Leeks. Peas. Squash. Tomatoes. Peppers. Radishes. Fruits: Apples. Apricots. Avocado. Berries. Bananas. Cherries. Dates. Figs. Grapes. Lemons. Melon. Oranges. Peaches. Plums. Pomegranate. Meats and other protein foods: Beans. Almonds. Sunflower seeds. Pine nuts. Peanuts. [place]. Salmon. Scallops. Shrimp. [place]. Tilapia. Clams. Oysters. Eggs. Dairy: Low-fat milk. Cheese. Greek yogurt. Beverages: Water. Red wine. Herbal tea. Fats and oils: Extra virgin olive oil. Avocado oil. Grape seed oil. Sweets and desserts: [place] yogurt with honey. Baked apples. Poached pears. Trail mix. Seasoning and other foods: Basil. Cilantro. Coriander. Cumin. Mint. Parsley. Sage. Rosemary. Tarragon. Garlic. Oregano. Thyme. Pepper. Balsalmic vinegar. Tahini. Hummus. Tomato sauce. Olives. Mushrooms. Limit these Grains: Prepackaged pasta or rice dishes.  Prepackaged cereal with added sugar. Vegetables: Deep fried potatoes (french fries). Fruits: Fruit canned in syrup. Meats and other protein foods: Beef. Pork. Lamb. Poultry with skin. Hot dogs. [place] Salines. Dairy: Ice cream. Sour cream. Whole milk. Beverages: Juice. Sugar-sweetened soft drinks. Beer. Liquor and spirits. Fats and oils: Butter. Canola oil. Vegetable oil. Beef fat (tallow). Lard. Sweets and desserts: Cookies. Cakes. Pies. Candy. Seasoning and other foods: Mayonnaise. Premade sauces and marinades. The items listed may not be a complete list. Talk with your dietitian about what dietary choices are right for you. Summary The Mediterranean diet includes both food and lifestyle choices. Eat a variety of fresh fruits and vegetables, beans, nuts, seeds, and whole grains. Limit the amount of red meat and sweets that you eat. Talk with your health care provider about whether it is safe for you to drink red wine in moderation. This means 1 glass a day for nonpregnant women and 2 glasses a day for men. A glass of wine equals 5 oz (150 mL). This information is not intended to replace advice given to you by your health care provider. Make sure you discuss any questions you have with your health care provider. Document Released: 07/17/2016 Document Revised: 08/19/2016 Document Reviewed: 07/17/2016 Elsevier Interactive Patient Education  2017 [employer].

## 2021-10-14 ENCOUNTER — Ambulatory Visit (INDEPENDENT_AMBULATORY_CARE_PROVIDER_SITE_OTHER): Payer: Medicare Other

## 2021-10-14 ENCOUNTER — Other Ambulatory Visit: Payer: Self-pay

## 2021-10-14 DIAGNOSIS — D6859 Other primary thrombophilia: Secondary | ICD-10-CM

## 2021-10-14 DIAGNOSIS — M9902 Segmental and somatic dysfunction of thoracic region: Secondary | ICD-10-CM | POA: Diagnosis not present

## 2021-10-14 DIAGNOSIS — Z5181 Encounter for therapeutic drug level monitoring: Secondary | ICD-10-CM

## 2021-10-14 DIAGNOSIS — M9901 Segmental and somatic dysfunction of cervical region: Secondary | ICD-10-CM | POA: Diagnosis not present

## 2021-10-14 DIAGNOSIS — M9903 Segmental and somatic dysfunction of lumbar region: Secondary | ICD-10-CM | POA: Diagnosis not present

## 2021-10-14 DIAGNOSIS — M5124 Other intervertebral disc displacement, thoracic region: Secondary | ICD-10-CM | POA: Diagnosis not present

## 2021-10-14 DIAGNOSIS — M5032 Other cervical disc degeneration, mid-cervical region, unspecified level: Secondary | ICD-10-CM | POA: Diagnosis not present

## 2021-10-14 DIAGNOSIS — M5022 Other cervical disc displacement, mid-cervical region, unspecified level: Secondary | ICD-10-CM | POA: Diagnosis not present

## 2021-10-14 LAB — POCT INR: INR: 3.4 — AB (ref 2.0–3.0)

## 2021-10-14 NOTE — Patient Instructions (Signed)
Description   Hold today's dose and then continue to take 1 tablet daily except for 1/2 a tablet on Mondays, Wednesday and Fridays. Recheck INR in 4 weeks. [facility] [phone]

## 2021-10-16 DIAGNOSIS — Z85828 Personal history of other malignant neoplasm of skin: Secondary | ICD-10-CM | POA: Diagnosis not present

## 2021-10-16 DIAGNOSIS — R229 Localized swelling, mass and lump, unspecified: Secondary | ICD-10-CM | POA: Diagnosis not present

## 2021-10-16 DIAGNOSIS — D485 Neoplasm of uncertain behavior of skin: Secondary | ICD-10-CM | POA: Diagnosis not present

## 2021-10-16 DIAGNOSIS — Z08 Encounter for follow-up examination after completed treatment for malignant neoplasm: Secondary | ICD-10-CM | POA: Diagnosis not present

## 2021-10-16 DIAGNOSIS — L570 Actinic keratosis: Secondary | ICD-10-CM | POA: Diagnosis not present

## 2021-10-16 DIAGNOSIS — L578 Other skin changes due to chronic exposure to nonionizing radiation: Secondary | ICD-10-CM | POA: Diagnosis not present

## 2021-10-16 DIAGNOSIS — C44329 Squamous cell carcinoma of skin of other parts of face: Secondary | ICD-10-CM | POA: Diagnosis not present

## 2021-10-16 DIAGNOSIS — C4442 Squamous cell carcinoma of skin of scalp and neck: Secondary | ICD-10-CM | POA: Diagnosis not present

## 2021-10-21 ENCOUNTER — Other Ambulatory Visit (HOSPITAL_BASED_OUTPATIENT_CLINIC_OR_DEPARTMENT_OTHER): Payer: Self-pay

## 2021-10-21 DIAGNOSIS — M9902 Segmental and somatic dysfunction of thoracic region: Secondary | ICD-10-CM | POA: Diagnosis not present

## 2021-10-21 DIAGNOSIS — M9901 Segmental and somatic dysfunction of cervical region: Secondary | ICD-10-CM | POA: Diagnosis not present

## 2021-10-21 DIAGNOSIS — M5032 Other cervical disc degeneration, mid-cervical region, unspecified level: Secondary | ICD-10-CM | POA: Diagnosis not present

## 2021-10-21 DIAGNOSIS — M5022 Other cervical disc displacement, mid-cervical region, unspecified level: Secondary | ICD-10-CM | POA: Diagnosis not present

## 2021-10-21 DIAGNOSIS — M9903 Segmental and somatic dysfunction of lumbar region: Secondary | ICD-10-CM | POA: Diagnosis not present

## 2021-10-21 DIAGNOSIS — M5124 Other intervertebral disc displacement, thoracic region: Secondary | ICD-10-CM | POA: Diagnosis not present

## 2021-10-22 ENCOUNTER — Other Ambulatory Visit (HOSPITAL_BASED_OUTPATIENT_CLINIC_OR_DEPARTMENT_OTHER): Payer: Self-pay

## 2021-10-22 ENCOUNTER — Ambulatory Visit: Payer: Medicare Other | Attending: Internal Medicine

## 2021-10-22 DIAGNOSIS — Z23 Encounter for immunization: Secondary | ICD-10-CM

## 2021-10-22 MED ORDER — PFIZER COVID-19 VAC BIVALENT 30 MCG/0.3ML IM SUSP
INTRAMUSCULAR | 0 refills | Status: DC
  Filled 2021-10-22: qty 0.3, 1d supply, fill #0

## 2021-10-22 NOTE — Progress Notes (Signed)
   Covid-19 Vaccination Clinic  Name:  [name]    MRN: [id number] DOB: [birth year]  10/22/2021  [name] was observed post Covid-19 immunization for 15 minutes without incident. He was provided with Vaccine Information Sheet and instruction to access the V-Safe system.   [name] was instructed to call 911 with any severe reactions post vaccine: Difficulty breathing  Swelling of face and throat  A fast heartbeat  A bad rash all over body  Dizziness and weakness   Immunizations Administered     Name Date Dose VIS Date Route   Pfizer Covid-19 Vaccine Bivalent Booster 10/22/2021  2:00 PM 0.3 mL 08/07/2021 Intramuscular   Manufacturer: [place]   Lot: [id number]   [place]: [phone]

## 2021-10-23 ENCOUNTER — Other Ambulatory Visit (HOSPITAL_BASED_OUTPATIENT_CLINIC_OR_DEPARTMENT_OTHER): Payer: Self-pay

## 2021-10-23 ENCOUNTER — Other Ambulatory Visit (HOSPITAL_COMMUNITY): Payer: Self-pay

## 2021-10-28 DIAGNOSIS — M9903 Segmental and somatic dysfunction of lumbar region: Secondary | ICD-10-CM | POA: Diagnosis not present

## 2021-10-28 DIAGNOSIS — M5124 Other intervertebral disc displacement, thoracic region: Secondary | ICD-10-CM | POA: Diagnosis not present

## 2021-10-28 DIAGNOSIS — M9901 Segmental and somatic dysfunction of cervical region: Secondary | ICD-10-CM | POA: Diagnosis not present

## 2021-10-28 DIAGNOSIS — M5032 Other cervical disc degeneration, mid-cervical region, unspecified level: Secondary | ICD-10-CM | POA: Diagnosis not present

## 2021-10-28 DIAGNOSIS — M5022 Other cervical disc displacement, mid-cervical region, unspecified level: Secondary | ICD-10-CM | POA: Diagnosis not present

## 2021-10-28 DIAGNOSIS — M9902 Segmental and somatic dysfunction of thoracic region: Secondary | ICD-10-CM | POA: Diagnosis not present

## 2021-10-29 ENCOUNTER — Other Ambulatory Visit: Payer: Self-pay

## 2021-10-29 ENCOUNTER — Ambulatory Visit (INDEPENDENT_AMBULATORY_CARE_PROVIDER_SITE_OTHER): Payer: Medicare Other | Admitting: Podiatry

## 2021-10-29 DIAGNOSIS — B351 Tinea unguium: Secondary | ICD-10-CM | POA: Diagnosis not present

## 2021-10-29 DIAGNOSIS — M79674 Pain in right toe(s): Secondary | ICD-10-CM

## 2021-10-29 DIAGNOSIS — Z7901 Long term (current) use of anticoagulants: Secondary | ICD-10-CM | POA: Diagnosis not present

## 2021-10-29 DIAGNOSIS — M79675 Pain in left toe(s): Secondary | ICD-10-CM

## 2021-10-29 NOTE — Progress Notes (Signed)
  Subjective:  Patient ID: [name], male    DOB: 18-Mar-1946,  MRN: [id number]  Chief Complaint  Patient presents with   Nail Problem    Thick painful toenails, 6 week follow up    75 y.o. male returns with the above complaint. History confirmed with patient.  He is on warfarin  Objective:  Physical Exam: warm, good capillary refill, no trophic changes or ulcerative lesions, and normal DP and PT pulses.  He has loss of protective sensation bilaterally. thickened elongated painful toenails with yellow discoloration and subungual debris bilaterally x10 Assessment:   1. Pain due to onychomycosis of toenails of both feet   2. Anticoagulated      Plan:  Patient was evaluated and treated and all questions answered.  I discussed with him and his wife that the pain he has in the metatarsal area is likely due to fat pad thinning.  Wear good supportive shoes and padding in the shoes.  He currently has this.  Discussed the etiology and treatment options for the condition in detail with the patient. Educated patient on the topical and oral treatment options for mycotic nails. Recommended debridement of the nails today. Sharp and mechanical debridement performed of all painful and mycotic nails today. Nails debrided in length and thickness using a nail nipper to level of comfort. Discussed treatment options including appropriate shoe gear. Follow up as needed for painful nails.  Continue ciclopirox.  I will see him back every 6 weeks for now    Return in about 6 weeks (around 12/10/2021) for painful nails .

## 2021-11-04 DIAGNOSIS — M5022 Other cervical disc displacement, mid-cervical region, unspecified level: Secondary | ICD-10-CM | POA: Diagnosis not present

## 2021-11-04 DIAGNOSIS — M5124 Other intervertebral disc displacement, thoracic region: Secondary | ICD-10-CM | POA: Diagnosis not present

## 2021-11-04 DIAGNOSIS — M9902 Segmental and somatic dysfunction of thoracic region: Secondary | ICD-10-CM | POA: Diagnosis not present

## 2021-11-04 DIAGNOSIS — M9903 Segmental and somatic dysfunction of lumbar region: Secondary | ICD-10-CM | POA: Diagnosis not present

## 2021-11-04 DIAGNOSIS — M9901 Segmental and somatic dysfunction of cervical region: Secondary | ICD-10-CM | POA: Diagnosis not present

## 2021-11-04 DIAGNOSIS — M5032 Other cervical disc degeneration, mid-cervical region, unspecified level: Secondary | ICD-10-CM | POA: Diagnosis not present

## 2021-11-04 NOTE — Progress Notes (Signed)
HPI: [name] is a 75 y.o.male with history of hypertension, hyperlipidemia, vitamin D deficiency, CAD, chronic pain, and unstable gait here today with his wife for follow-up.  He was last seen on 10/02/2021 for acute visit. Since his last visit he has followed with neurologist and with cardiologist (coumadin clinic).  Depakote was added for hallucinations , he has not taken it. He has no longer having hallucination, he had one last episode while he was taking antibiotics for possible UTI. His wife has not noted any MS changes/confusion. States that he remembers recent events and is able to keep track with time, sometimes remind her things she needs to do. He denies depression like symptoms.  Negative for unusual/frequent headache or new focal weakness. He is currently on donepezil 10 mg daily. Follows with neurologist annually, next appointment 01/2022.  Hypertension:  Medications: Losartan 100 mg daily and amlodipine 5 mg daily. BP readings at home:120's/70. Side effects: None CAD s/p CABG in 1996.  He follows annually with cardiologist, next appointment in 12/2021. Hypercoagulable state diagnosed in 1996.  Initial work-up showed markedly elevated fibrinopeptide A level, mild resistance to activated protein C . He is on chronic anticoagulation, he is on Coumadin.  Negative for unusual or severe headache, visual changes, exertional chest pain, dyspnea, or worsening edema. LE edema is usually worse at the end of the day. He wears compression stockings as needed. He is on furosemide 20 mg daily.  Lab Results  Component Value Date   CREATININE 0.94 10/02/2021   BUN 15 10/02/2021   NA 141 10/02/2021   K 3.6 10/02/2021   CL 107 10/02/2021   CO2 28 10/02/2021   Hyperlipidemia: Currently on rosuvastatin 40 mg daily, fenofibrate 145 mg daily, and Zetia 10 mg daily.  Following a low fat diet: Yes. Side effects from medication: None Lab Results  Component Value Date    CHOL 128 01/03/2021   HDL 56 01/03/2021   LDLCALC 60 01/03/2021   TRIG 55 01/03/2021   CHOLHDL 2.3 01/03/2021   Glucose was elevated in 09/2021, 123. No hx of DM. Negative for abdominal pain, nausea,vomiting, polydipsia,polyuria, or polyphagia.  Chronic pain: Chiropractor once per week, which helps. He uses a cane for transfer. Negative for recent falls. Currently he is on tramadol 50 mg, which he takes very sporadically, about once every 2 to 3 weeks and it does help with pain. Pain is exacerbated by some chores around the house, yard work. He is not longer doing PT, last time he tried did not seem to help, for the contrary he was aggravating joint pain.  Review of Systems  Constitutional:  Negative for activity change, appetite change and fever.  HENT:  Negative for mouth sores, nosebleeds and sore throat.   Respiratory:  Negative for cough and wheezing.   Genitourinary:  Negative for decreased urine volume, dysuria and hematuria.  Musculoskeletal:  Positive for arthralgias, gait problem and neck pain.  Neurological:  Negative for syncope and facial asymmetry.  Rest of positive and negative ROS per HPI.  Current Outpatient Medications on File Prior to Visit  Medication Sig Dispense Refill   acetaminophen (TYLENOL) 500 MG tablet Take 1,000 mg by mouth every 6 (six) hours as needed for moderate pain or headache.     amLODipine (NORVASC) 5 MG tablet TAKE ONE AND ONE-HALF TABLETS DAILY 135 tablet 1   cholecalciferol (VITAMIN D) 1000 units tablet Take 1,000 Units by mouth daily.      ciclopirox (PENLAC) 8 %  solution Apply topically at bedtime. Apply over nail and surrounding skin. Apply daily over previous coat. After seven (7) days, may remove with alcohol and continue cycle. 6.6 mL 6   Cyanocobalamin (VITAMIN B12) 500 MCG TABS Take 500 mcg by mouth daily.      donepezil (ARICEPT) 10 MG tablet Take 1 tablet (10 mg total) by mouth at bedtime. 90 tablet 1   ezetimibe (ZETIA) 10 MG  tablet TAKE 1 TABLET AT BEDTIME 90 tablet 1   fenofibrate (TRICOR) 145 MG tablet TAKE 1 TABLET AT BEDTIME 90 tablet 3   furosemide (LASIX) 20 MG tablet Take 1 tablet (20 mg total) by mouth daily. May take an extra dose 20mg  twice a week for swelling 90 tablet 3   losartan (COZAAR) 100 MG tablet TAKE 1 TABLET DAILY 90 tablet 3   nitroGLYCERIN (NITROSTAT) 0.4 MG SL tablet Place 1 tablet (0.4 mg total) under the tongue every 5 (five) minutes as needed for chest pain. 75 tablet 2   Olopatadine HCl (PATADAY OP) Place 1 drop into both eyes daily as needed (allergies).     rosuvastatin (CRESTOR) 40 MG tablet Take 1 tablet (40 mg total) by mouth daily. 90 tablet 3   traMADol (ULTRAM) 50 MG tablet Take 1 tablet (50 mg total) by mouth daily as needed. 30 tablet 0   warfarin (COUMADIN) 5 MG tablet TAKE AS DIRECTED BY COUMADIN CLINIC 80 tablet 3   divalproex (DEPAKOTE) 125 MG DR tablet Take 1 tab at night for 1 week, then increase to 2 tablets at night 180 tablet 3   No current facility-administered medications on file prior to visit.   Past Medical History:  Diagnosis Date   Allergy    Arthritis    CAD (coronary artery disease) 1996   POST CABG   Cataracts, bilateral    Chicken pox    Dyslipidemia    Hyperlipidemia    Hypertension    Sinus bradycardia    Past Surgical History:  Procedure Laterality Date   BYPASS GRAFT  1996   CATARACT EXTRACTION Right    INGUINAL HERNIA REPAIR Right 04/09/2020   Procedure: LAPAROSCOPIC RIGHT INGUINAL HERNIA REPAIR WITH MESH;  Surgeon: Ralene Ok, MD;  Location: [facility];  Service: General;  Laterality: Right;   REFRACTIVE SURGERY Right    SKIN BIOPSY     head x 2   UMBILICAL HERNIA REPAIR  2003   Allergies  Allergen Reactions   Other     Environmental allergies   Latex     Itchy hands   Family History  Problem Relation Age of Onset   Alzheimer's disease Mother    Cancer Father        prostate   Prostate cancer Father    AAA (abdominal aortic  aneurysm) Sister    Hypertension Maternal Grandmother    Hypertension Paternal Grandmother    Colon cancer Neg Hx    Esophageal cancer Neg Hx    Pancreatic cancer Neg Hx    Liver disease Neg Hx    Social History   Socioeconomic History   Marital status: Married    Spouse name: [name]   Number of children: 2   Years of education: COLLEGE   Highest education level: Not on file  Occupational History   Occupation: RETIRED  Tobacco Use   Smoking status: Former    Types: Cigarettes    Quit date: 05/29/1982    Years since quitting: 39.4   Smokeless tobacco: Never  Substance and  Sexual Activity   Alcohol use: Yes    Comment: 0-2 drinks a night   Drug use: No   Sexual activity: Not on file  Other Topics Concern   Not on file  Social History Narrative   Right handed   Social Determinants of Health   Financial Resource Strain: Low Risk    Difficulty of Paying Living Expenses: Not hard at all  Food Insecurity: No Food Insecurity   Worried About Charity fundraiser in the Last Year: Never true   Ran Out of Food in the Last Year: Never true  Transportation Needs: No Transportation Needs   Lack of Transportation (Medical): No   Lack of Transportation (Non-Medical): No  Physical Activity: Inactive   Days of Exercise per Week: 0 days   Minutes of Exercise per Session: 0 min  Stress: No Stress Concern Present   Feeling of Stress : Not at all  Social Connections: Moderately Isolated   Frequency of Communication with Friends and Family: More than three times a week   Frequency of Social Gatherings with Friends and Family: Once a week   Attends Religious Services: Never   Marine scientist or Organizations: No   Attends Archivist Meetings: Never   Marital Status: Married   Vitals:   11/05/21 0757  BP: 126/78  Pulse: (!) 59  Resp: 16  Temp: 98.3 F (36.8 C)  SpO2: 99%   Body mass index is 23.85 kg/m.  Wt Readings from Last 3 Encounters:  11/05/21 190 lb  12.8 oz (86.5 kg)  10/11/21 187 lb (84.8 kg)  10/02/21 186 lb (84.4 kg)   Physical Exam Vitals and nursing note reviewed.  Constitutional:      General: He is not in acute distress.    Appearance: He is well-developed and normal weight.  HENT:     Head: Normocephalic and atraumatic.     Mouth/Throat:     Mouth: Mucous membranes are moist.     Pharynx: Oropharynx is clear.  Eyes:     Conjunctiva/sclera: Conjunctivae normal.  Cardiovascular:     Rate and Rhythm: Regular rhythm. Bradycardia present.     Heart sounds: Murmur (? Soft SEM RUSB) heard.     Comments: DP pulses present. Pulmonary:     Effort: Pulmonary effort is normal. No respiratory distress.     Breath sounds: Normal breath sounds.  Abdominal:     Palpations: Abdomen is soft. There is no hepatomegaly or mass.     Tenderness: There is no abdominal tenderness.  Musculoskeletal:     Thoracic back: Scoliosis present.     Right lower leg: 1+ Pitting Edema present.     Left lower leg: 1+ Pitting Edema present.  Skin:    General: Skin is warm.     Findings: No erythema or rash.  Neurological:     Mental Status: He is alert and oriented to person, place, and time.     Cranial Nerves: No cranial nerve deficit.     Comments: Unstable gait, assisted with a cane.  Psychiatric:     Comments: Well groomed, good eye contact.   ASSESSMENT AND PLAN:  TimothyTameem was seen today for follow-up.  Diagnoses and all orders for this visit: Orders Placed This Encounter  Procedures   Flu Vaccine QUAD High Dose(Fluad)   Hemoglobin A1c   Lipid panel   Hepatic function panel   Lab Results  Component Value Date   HGBA1C 5.8 11/05/2021   Lab Results  Component Value Date   ALT 17 11/05/2021   AST 21 11/05/2021   ALKPHOS 35 (L) 11/05/2021   BILITOT 0.9 11/05/2021   Lab Results  Component Value Date   CHOL 128 11/05/2021   HDL 60.60 11/05/2021   LDLCALC 58 11/05/2021   TRIG 47.0 11/05/2021   CHOLHDL 2 11/05/2021    Essential hypertension BP is adequately controlled. Continue losartan 100 mg daily and amlodipine 5 mg daily. Continue low-salt diet and monitoring BP regularly.  Hyperlipidemia LDL goal < 70. Continue rosuvastatin 40 mg daily, fenofibrate 145 mg daily, and Zetia 10 mg daily as well as low-fat diet. Following with cardiologist, next appointment in 12/2021.  Generalized osteoarthrosis, involving multiple sites Treatment with chiropractor has helped with upper back pain. Fall precautions discussed. Continue tramadol 50 mg daily as needed, we discussed some side effects.  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 Continue Aricept 10 mg daily. Following with neurologist.  Hyperglycemia Continue a healthy lifestyle for diabetes prevention. Further recommendation will be given according to hemoglobin A1c result.  Need for influenza vaccination -     Flu Vaccine QUAD High Dose(Fluad)  I spent a total of 43 minutes in both face to face and non face to face activities for this visit on the date of this encounter. During this time history was obtained and documented, examination was performed,neuro/cardio visits and prior labs reviewed, and assessment/plan discussed.  Return in about 6 months (around 05/05/2022).   Deshana Rominger G. [place], MD  [facility]. [place] office.

## 2021-11-05 ENCOUNTER — Encounter: Payer: Self-pay | Admitting: Family Medicine

## 2021-11-05 ENCOUNTER — Ambulatory Visit (INDEPENDENT_AMBULATORY_CARE_PROVIDER_SITE_OTHER): Payer: Medicare Other | Admitting: Family Medicine

## 2021-11-05 VITALS — BP 126/78 | HR 59 | Temp 98.3°F | Resp 16 | Ht 75.0 in | Wt 190.8 lb

## 2021-11-05 DIAGNOSIS — I10 Essential (primary) hypertension: Secondary | ICD-10-CM

## 2021-11-05 DIAGNOSIS — Z23 Encounter for immunization: Secondary | ICD-10-CM

## 2021-11-05 DIAGNOSIS — E782 Mixed hyperlipidemia: Secondary | ICD-10-CM | POA: Diagnosis not present

## 2021-11-05 DIAGNOSIS — R739 Hyperglycemia, unspecified: Secondary | ICD-10-CM

## 2021-11-05 DIAGNOSIS — M159 Polyosteoarthritis, unspecified: Secondary | ICD-10-CM

## 2021-11-05 DIAGNOSIS — F0670 Mild neurocognitive disorder due to known physiological condition without behavioral disturbance: Secondary | ICD-10-CM | POA: Insufficient documentation

## 2021-11-05 DIAGNOSIS — G309 Alzheimer's disease, unspecified: Secondary | ICD-10-CM | POA: Diagnosis not present

## 2021-11-05 DIAGNOSIS — I25118 Atherosclerotic heart disease of native coronary artery with other forms of angina pectoris: Secondary | ICD-10-CM

## 2021-11-05 LAB — HEPATIC FUNCTION PANEL
ALT: 17 U/L (ref 0–53)
AST: 21 U/L (ref 0–37)
Albumin: 4.6 g/dL (ref 3.5–5.2)
Alkaline Phosphatase: 35 U/L — ABNORMAL LOW (ref 39–117)
Bilirubin, Direct: 0.2 mg/dL (ref 0.0–0.3)
Total Bilirubin: 0.9 mg/dL (ref 0.2–1.2)
Total Protein: 6.9 g/dL (ref 6.0–8.3)

## 2021-11-05 LAB — LIPID PANEL
Cholesterol: 128 mg/dL (ref 0–200)
HDL: 60.6 mg/dL (ref >39.00)
LDL Cholesterol: 58 mg/dL (ref 0–99)
NonHDL: 67.48
Total CHOL/HDL Ratio: 2
Triglycerides: 47 mg/dL (ref 0.0–149.0)
VLDL: 9.4 mg/dL (ref 0.0–40.0)

## 2021-11-05 LAB — HEMOGLOBIN A1C: Hgb A1c MFr Bld: 5.8 % (ref 4.6–6.5)

## 2021-11-05 NOTE — Assessment & Plan Note (Signed)
BP is adequately controlled. Continue losartan 100 mg daily and amlodipine 5 mg daily. Continue low-salt diet and monitoring BP regularly.

## 2021-11-05 NOTE — Assessment & Plan Note (Signed)
Treatment with chiropractor has helped with upper back pain. Fall precautions discussed. Continue tramadol 50 mg daily as needed, we discussed some side effects.

## 2021-11-05 NOTE — Patient Instructions (Addendum)
A few things to remember from today's visit:  Mixed hyperlipidemia - Plan: Lipid panel, Hepatic function panel  Essential hypertension  Generalized osteoarthrosis, involving multiple sites  Hyperglycemia - Plan: Hemoglobin A1c  If you need refills please call your pharmacy. Do not use My Chart to request refills or for acute issues that need immediate attention.  Please be sure medication list is accurate. If a new problem present, please set up appointment sooner than planned today.  No changes in medications. I can see you back in 6 months. Fall precautions to continue. Take Tramadol for pain daily if needed.

## 2021-11-05 NOTE — Assessment & Plan Note (Signed)
Problem seems to be stable. Continue Aricept 10 mg daily. Following with neurologist.

## 2021-11-05 NOTE — Assessment & Plan Note (Signed)
LDL goal < 70. Continue rosuvastatin 40 mg daily, fenofibrate 145 mg daily, and Zetia 10 mg daily as well as low-fat diet. Following with cardiologist, next appointment in 12/2021.

## 2021-11-07 DIAGNOSIS — L578 Other skin changes due to chronic exposure to nonionizing radiation: Secondary | ICD-10-CM | POA: Diagnosis not present

## 2021-11-07 DIAGNOSIS — D485 Neoplasm of uncertain behavior of skin: Secondary | ICD-10-CM | POA: Diagnosis not present

## 2021-11-07 DIAGNOSIS — L570 Actinic keratosis: Secondary | ICD-10-CM | POA: Diagnosis not present

## 2021-11-07 DIAGNOSIS — C44229 Squamous cell carcinoma of skin of left ear and external auricular canal: Secondary | ICD-10-CM | POA: Diagnosis not present

## 2021-11-07 DIAGNOSIS — D044 Carcinoma in situ of skin of scalp and neck: Secondary | ICD-10-CM | POA: Diagnosis not present

## 2021-11-11 DIAGNOSIS — M5124 Other intervertebral disc displacement, thoracic region: Secondary | ICD-10-CM | POA: Diagnosis not present

## 2021-11-11 DIAGNOSIS — M9902 Segmental and somatic dysfunction of thoracic region: Secondary | ICD-10-CM | POA: Diagnosis not present

## 2021-11-11 DIAGNOSIS — M9901 Segmental and somatic dysfunction of cervical region: Secondary | ICD-10-CM | POA: Diagnosis not present

## 2021-11-11 DIAGNOSIS — M5022 Other cervical disc displacement, mid-cervical region, unspecified level: Secondary | ICD-10-CM | POA: Diagnosis not present

## 2021-11-11 DIAGNOSIS — M5032 Other cervical disc degeneration, mid-cervical region, unspecified level: Secondary | ICD-10-CM | POA: Diagnosis not present

## 2021-11-11 DIAGNOSIS — M9903 Segmental and somatic dysfunction of lumbar region: Secondary | ICD-10-CM | POA: Diagnosis not present

## 2021-11-12 DIAGNOSIS — H35033 Hypertensive retinopathy, bilateral: Secondary | ICD-10-CM | POA: Diagnosis not present

## 2021-11-12 DIAGNOSIS — H04123 Dry eye syndrome of bilateral lacrimal glands: Secondary | ICD-10-CM | POA: Diagnosis not present

## 2021-11-12 DIAGNOSIS — D3131 Benign neoplasm of right choroid: Secondary | ICD-10-CM | POA: Diagnosis not present

## 2021-11-12 DIAGNOSIS — H25012 Cortical age-related cataract, left eye: Secondary | ICD-10-CM | POA: Diagnosis not present

## 2021-11-12 DIAGNOSIS — H2512 Age-related nuclear cataract, left eye: Secondary | ICD-10-CM | POA: Diagnosis not present

## 2021-11-14 ENCOUNTER — Other Ambulatory Visit: Payer: Self-pay

## 2021-11-14 ENCOUNTER — Ambulatory Visit (INDEPENDENT_AMBULATORY_CARE_PROVIDER_SITE_OTHER): Payer: Medicare Other | Admitting: *Deleted

## 2021-11-14 DIAGNOSIS — D6859 Other primary thrombophilia: Secondary | ICD-10-CM | POA: Diagnosis not present

## 2021-11-14 DIAGNOSIS — Z5181 Encounter for therapeutic drug level monitoring: Secondary | ICD-10-CM

## 2021-11-14 LAB — POCT INR: INR: 1.5 — AB (ref 2.0–3.0)

## 2021-11-14 NOTE — Patient Instructions (Addendum)
Description   Today take 1.5 tablets and tomorrow take 1 tablet then continue taking 1 tablet daily except for 1/2 tablet on Mondays, Wednesday and Fridays. Recheck INR in 1 week. Coumadin Clinic [phone]

## 2021-11-18 DIAGNOSIS — M9901 Segmental and somatic dysfunction of cervical region: Secondary | ICD-10-CM | POA: Diagnosis not present

## 2021-11-18 DIAGNOSIS — M5124 Other intervertebral disc displacement, thoracic region: Secondary | ICD-10-CM | POA: Diagnosis not present

## 2021-11-18 DIAGNOSIS — M5022 Other cervical disc displacement, mid-cervical region, unspecified level: Secondary | ICD-10-CM | POA: Diagnosis not present

## 2021-11-18 DIAGNOSIS — M9903 Segmental and somatic dysfunction of lumbar region: Secondary | ICD-10-CM | POA: Diagnosis not present

## 2021-11-18 DIAGNOSIS — M9902 Segmental and somatic dysfunction of thoracic region: Secondary | ICD-10-CM | POA: Diagnosis not present

## 2021-11-18 DIAGNOSIS — M5032 Other cervical disc degeneration, mid-cervical region, unspecified level: Secondary | ICD-10-CM | POA: Diagnosis not present

## 2021-11-21 ENCOUNTER — Other Ambulatory Visit: Payer: Self-pay

## 2021-11-21 ENCOUNTER — Ambulatory Visit (INDEPENDENT_AMBULATORY_CARE_PROVIDER_SITE_OTHER): Payer: Medicare Other | Admitting: *Deleted

## 2021-11-21 DIAGNOSIS — Z5181 Encounter for therapeutic drug level monitoring: Secondary | ICD-10-CM | POA: Diagnosis not present

## 2021-11-21 DIAGNOSIS — D6859 Other primary thrombophilia: Secondary | ICD-10-CM

## 2021-11-21 LAB — POCT INR: INR: 2.2 (ref 2.0–3.0)

## 2021-11-21 NOTE — Patient Instructions (Addendum)
°  Description   Continue taking 1 tablet daily except for 1/2 tablet on Mondays, Wednesday and Fridays. Recheck INR in 2 weeks. [facility] [phone]

## 2021-11-25 DIAGNOSIS — M5032 Other cervical disc degeneration, mid-cervical region, unspecified level: Secondary | ICD-10-CM | POA: Diagnosis not present

## 2021-11-25 DIAGNOSIS — M9902 Segmental and somatic dysfunction of thoracic region: Secondary | ICD-10-CM | POA: Diagnosis not present

## 2021-11-25 DIAGNOSIS — M5124 Other intervertebral disc displacement, thoracic region: Secondary | ICD-10-CM | POA: Diagnosis not present

## 2021-11-25 DIAGNOSIS — M9903 Segmental and somatic dysfunction of lumbar region: Secondary | ICD-10-CM | POA: Diagnosis not present

## 2021-11-25 DIAGNOSIS — M9901 Segmental and somatic dysfunction of cervical region: Secondary | ICD-10-CM | POA: Diagnosis not present

## 2021-11-25 DIAGNOSIS — M5022 Other cervical disc displacement, mid-cervical region, unspecified level: Secondary | ICD-10-CM | POA: Diagnosis not present

## 2021-12-04 ENCOUNTER — Other Ambulatory Visit: Payer: Self-pay | Admitting: Neurology

## 2021-12-04 DIAGNOSIS — F0670 Mild neurocognitive disorder due to known physiological condition without behavioral disturbance: Secondary | ICD-10-CM

## 2021-12-05 ENCOUNTER — Ambulatory Visit (INDEPENDENT_AMBULATORY_CARE_PROVIDER_SITE_OTHER): Payer: Medicare Other

## 2021-12-05 ENCOUNTER — Other Ambulatory Visit: Payer: Self-pay

## 2021-12-05 DIAGNOSIS — D6859 Other primary thrombophilia: Secondary | ICD-10-CM

## 2021-12-05 DIAGNOSIS — Z5181 Encounter for therapeutic drug level monitoring: Secondary | ICD-10-CM | POA: Diagnosis not present

## 2021-12-05 LAB — POCT INR: INR: 2 (ref 2.0–3.0)

## 2021-12-05 NOTE — Patient Instructions (Signed)
Description   Take 1.5 tablets today and then Continue taking 1 tablet daily except for 1/2 tablet on Mondays, Wednesday and Fridays. Recheck INR in 4 weeks. [facility] Clinic [phone]

## 2021-12-10 ENCOUNTER — Encounter: Payer: Self-pay | Admitting: Podiatry

## 2021-12-10 ENCOUNTER — Other Ambulatory Visit: Payer: Self-pay

## 2021-12-10 ENCOUNTER — Ambulatory Visit (INDEPENDENT_AMBULATORY_CARE_PROVIDER_SITE_OTHER): Payer: Medicare Other | Admitting: Podiatry

## 2021-12-10 DIAGNOSIS — M79675 Pain in left toe(s): Secondary | ICD-10-CM | POA: Diagnosis not present

## 2021-12-10 DIAGNOSIS — Z7901 Long term (current) use of anticoagulants: Secondary | ICD-10-CM | POA: Diagnosis not present

## 2021-12-10 DIAGNOSIS — M79674 Pain in right toe(s): Secondary | ICD-10-CM

## 2021-12-10 DIAGNOSIS — B351 Tinea unguium: Secondary | ICD-10-CM

## 2021-12-10 NOTE — Progress Notes (Signed)
°  Subjective:  Patient ID: [name], male    DOB: [birth year],  MRN: [id number]  Chief Complaint  Patient presents with   Nail Problem    Thick painful toenails    76 y.o. male returns with the above complaint. History confirmed with patient.  He is on warfarin doing well the intermittent debridement has been very helpful for him, the calluses are no longer painful with his new orthoses   Objective:  Physical Exam: warm, good capillary refill, no trophic changes or ulcerative lesions, and normal DP and PT pulses.  He has loss of protective sensation bilaterally. thickened elongated painful toenails with yellow discoloration and subungual debris bilaterally x10 Assessment:   1. Pain due to onychomycosis of toenails of both feet   2. Anticoagulated       Plan:  Patient was evaluated and treated and all questions answered.  Calluses doing well and minimally symptomatic with his current offloading orthoses  Discussed the etiology and treatment options for the condition in detail with the patient. Educated patient on the topical and oral treatment options for mycotic nails. Recommended debridement of the nails today. Sharp and mechanical debridement performed of all painful and mycotic nails today. Nails debrided in length and thickness using a nail nipper to level of comfort. Discussed treatment options including appropriate shoe gear. Follow up as needed for painful nails.  Continue ciclopirox.  I will see him back every 6 weeks for now    Return in about 6 weeks (around 01/21/2022) for at risk nail care.

## 2021-12-13 ENCOUNTER — Other Ambulatory Visit: Payer: Self-pay | Admitting: Family Medicine

## 2021-12-16 DIAGNOSIS — M5124 Other intervertebral disc displacement, thoracic region: Secondary | ICD-10-CM | POA: Diagnosis not present

## 2021-12-16 DIAGNOSIS — M9902 Segmental and somatic dysfunction of thoracic region: Secondary | ICD-10-CM | POA: Diagnosis not present

## 2021-12-16 DIAGNOSIS — M5022 Other cervical disc displacement, mid-cervical region, unspecified level: Secondary | ICD-10-CM | POA: Diagnosis not present

## 2021-12-16 DIAGNOSIS — M9903 Segmental and somatic dysfunction of lumbar region: Secondary | ICD-10-CM | POA: Diagnosis not present

## 2021-12-16 DIAGNOSIS — M5032 Other cervical disc degeneration, mid-cervical region, unspecified level: Secondary | ICD-10-CM | POA: Diagnosis not present

## 2021-12-16 DIAGNOSIS — M9901 Segmental and somatic dysfunction of cervical region: Secondary | ICD-10-CM | POA: Diagnosis not present

## 2021-12-18 ENCOUNTER — Other Ambulatory Visit: Payer: Self-pay | Admitting: Family Medicine

## 2021-12-18 ENCOUNTER — Ambulatory Visit: Payer: Medicare Other | Admitting: Neurology

## 2021-12-18 DIAGNOSIS — M159 Polyosteoarthritis, unspecified: Secondary | ICD-10-CM

## 2021-12-18 NOTE — Telephone Encounter (Signed)
Patient stated he received a missed call from office. I asked Judson Roch if she had called and she had not. I let patient know that it was probably a reminder call for his appointment on Monday and if it was someone from our office, they would callback if they needed him. Pt verbalized understanding

## 2021-12-19 DIAGNOSIS — C4492 Squamous cell carcinoma of skin, unspecified: Secondary | ICD-10-CM | POA: Diagnosis not present

## 2021-12-19 DIAGNOSIS — C44229 Squamous cell carcinoma of skin of left ear and external auricular canal: Secondary | ICD-10-CM | POA: Diagnosis not present

## 2021-12-19 DIAGNOSIS — Z481 Encounter for planned postprocedural wound closure: Secondary | ICD-10-CM | POA: Diagnosis not present

## 2021-12-23 ENCOUNTER — Ambulatory Visit (INDEPENDENT_AMBULATORY_CARE_PROVIDER_SITE_OTHER): Payer: Medicare Other

## 2021-12-23 VITALS — BP 132/72 | HR 68 | Temp 98.4°F | Ht 75.0 in | Wt 191.5 lb

## 2021-12-23 DIAGNOSIS — Z0000 Encounter for general adult medical examination without abnormal findings: Secondary | ICD-10-CM | POA: Diagnosis not present

## 2021-12-23 NOTE — Addendum Note (Signed)
Addended by: Nathanial Millman E on: 12/23/2021 01:52 PM   Modules accepted: Orders

## 2021-12-23 NOTE — Patient Instructions (Addendum)
[name] , Thank you for taking time to come for your Medicare Wellness Visit. I appreciate your ongoing commitment to your health goals. Please review the following plan we discussed and let me know if I can assist you in the future.   These are the goals we discussed:  Opioid Pain Medicine Management Opioids are powerful medicines that are used to treat moderate to severe pain. When used for short periods of time, they can help you to: Sleep better. Do better in physical or occupational therapy. Feel better in the first few days after an injury. Recover from surgery. Opioids should be taken with the supervision of a trained health care provider. They should be taken for the shortest period of time possible. This is because opioids can be addictive, and the longer you take opioids, the greater your risk of addiction. This addiction can also be called opioid use disorder. What are the risks? Using opioid pain medicines for longer than 3 days increases your risk of side effects. Side effects include: Constipation. Nausea and vomiting. Breathing difficulties (respiratory depression). Drowsiness. Confusion. Opioid use disorder. Itching. Taking opioid pain medicine for a long period of time can affect your ability to do daily tasks. It also puts you at risk for: Motor vehicle crashes. Depression. Suicide. Heart attack. Overdose, which can be life-threatening. What is a pain treatment plan? A pain treatment plan is an agreement between you and your health care provider. Pain is unique to each person, and treatments vary depending on your condition. To manage your pain, you and your health care provider need to work together. To help you do this: Discuss the goals of your treatment, including how much pain you might expect to have and how you will manage the pain. Review the risks and benefits of taking opioid medicines. Remember that a good treatment plan uses more than one approach  and minimizes the chance of side effects. Be honest about the amount of medicines you take and about any drug or alcohol use. Get pain medicine prescriptions from only one health care provider. Pain can be managed with many types of alternative treatments. Ask your health care provider to refer you to one or more specialists who can help you manage pain through: Physical or occupational therapy. Counseling (cognitive behavioral therapy). Good nutrition. Biofeedback. Massage. Meditation. Non-opioid medicine. Following a gentle exercise program. How to use opioid pain medicine Taking medicine Take your pain medicine exactly as told by your health care provider. Take it only when you need it. If your pain gets less severe, you may take less than your prescribed dose if your health care provider approves. If you are not having pain, do nottake pain medicine unless your health care provider tells you to take it. If your pain is severe, do nottry to treat it yourself by taking more pills than instructed on your prescription. Contact your health care provider for help. Write down the times when you take your pain medicine. It is easy to become confused while on pain medicine. Writing the time can help you avoid overdose. Take other over-the-counter or prescription medicines only as told by your health care provider. Keeping yourself and others safe  While you are taking opioid pain medicine: Do not drive, use machinery, or power tools. Do not sign legal documents. Do not drink alcohol. Do not take sleeping pills. Do not supervise children by yourself. Do not do activities that require climbing or being in high places. Do not go to a  lake, river, ocean, spa, or swimming pool. Do not share your pain medicine with anyone. Keep pain medicine in a locked cabinet or in a secure area where pets and children cannot reach it. Stopping your use of opioids If you have been taking opioid medicine for  more than a few weeks, you may need to slowly decrease (taper) how much you take until you stop completely. Tapering your use of opioids can decrease your risk of symptoms of withdrawal, such as: Pain and cramping in the abdomen. Nausea. Sweating. Sleepiness. Restlessness. Uncontrollable shaking (tremors). Cravings for the medicine. Do not attempt to taper your use of opioids on your own. Talk with your health care provider about how to do this. Your health care provider may prescribe a step-down schedule based on how much medicine you are taking and how long you have been taking it. Getting rid of leftover pills Do not save any leftover pills. Get rid of leftover pills safely by: Taking the medicine to a prescription take-back program. This is usually offered by the county or law enforcement. Bringing them to a pharmacy that has a drug disposal container. Flushing them down the toilet. Check the label or package insert of your medicine to see whether this is safe to do. Throwing them out in the trash. Check the label or package insert of your medicine to see whether this is safe to do. If it is safe to throw it out, remove the medicine from the original container, put it into a sealable bag or container, and mix it with used coffee grounds, food scraps, dirt, or cat litter before putting it in the trash. Follow these instructions at home: Activity Do exercises as told by your health care provider. Avoid activities that make your pain worse. Return to your normal activities as told by your health care provider. Ask your health care provider what activities are safe for you. General instructions You may need to take these actions to prevent or treat constipation: Drink enough fluid to keep your urine pale yellow. Take over-the-counter or prescription medicines. Eat foods that are high in fiber, such as beans, whole grains, and fresh fruits and vegetables. Limit foods that are high in fat and  processed sugars, such as fried or sweet foods. Keep all follow-up visits. This is important. Where to find support If you have been taking opioids for a long time, you may benefit from receiving support for quitting from a local support group or counselor. Ask your health care provider for a referral to these resources in your area. Where to find more information Centers for Disease Control and Prevention (CDC): http://www.wolf.info/ [place] Food and Drug Administration (FDA): [employer] Get help right away if: You may have taken too much of an opioid (overdosed). Common symptoms of an overdose: Your breathing is slower or more shallow than normal. You have a very slow heartbeat (pulse). You have slurred speech. You have nausea and vomiting. Your pupils become very small. You have other potential symptoms: You are very confused. You faint or feel like you will faint. You have cold, clammy skin. You have blue lips or fingernails. You have thoughts of harming yourself or harming others. These symptoms may represent a serious problem that is an emergency. Do not wait to see if the symptoms will go away. Get medical help right away. Call your local emergency services (911 in the [place] Do not drive yourself to the hospital.  If you ever feel like you may hurt yourself or  others, or have thoughts about taking your own life, get help right away. Go to your nearest emergency department or: Call your local emergency services (911 in the [place] Call the [facility] 925-775-6300 in the [place] Call a suicide crisis helpline, such as the [facility] at (810)762-1373 or 988 in the [place]. This is open 24 hours a day in the [place] Text the Crisis Text Line at 602-721-0230 (in the [place] Summary Opioid medicines can help you manage moderate to severe pain for a short period of time. A pain treatment plan is an agreement between you and your health care provider. Discuss the  goals of your treatment, including how much pain you might expect to have and how you will manage the pain. If you think that you or someone else may have taken too much of an opioid, get medical help right away. This information is not intended to replace advice given to you by your health care provider. Make sure you discuss any questions you have with your health care provider. Document Revised: 06/19/2021 Document Reviewed: 03/06/2021 Elsevier Patient Education  2022 [employer].   This is a list of the screening recommended for you and due dates:   Advanced directives: Yes  Conditions/risks identified: None  Next appointment: Follow up in one year for your annual wellness visit.   Preventive Care 66 Years and Older, Male Preventive care refers to lifestyle choices and visits with your health care provider that can promote health and wellness. What does preventive care include? A yearly physical exam. This is also called an annual well check. Dental exams once or twice a year. Routine eye exams. Ask your health care provider how often you should have your eyes checked. Personal lifestyle choices, including: Daily care of your teeth and gums. Regular physical activity. Eating a healthy diet. Avoiding tobacco and drug use. Limiting alcohol use. Practicing safe sex. Taking low doses of aspirin every day. Taking vitamin and mineral supplements as recommended by your health care provider. What happens during an annual well check? The services and screenings done by your health care provider during your annual well check will depend on your age, overall health, lifestyle risk factors, and family history of disease. Counseling  Your health care provider may ask you questions about your: Alcohol use. Tobacco use. Drug use. Emotional well-being. Home and relationship well-being. Sexual activity. Eating habits. History of falls. Memory and ability to understand  (cognition). Work and work Statistician. Screening  You may have the following tests or measurements: Height, weight, and BMI. Blood pressure. Lipid and cholesterol levels. These may be checked every 5 years, or more frequently if you are over 72 years old. Skin check. Lung cancer screening. You may have this screening every year starting at age 71 if you have a 30-pack-year history of smoking and currently smoke or have quit within the past 15 years. Fecal occult blood test (FOBT) of the stool. You may have this test every year starting at age 42. Flexible sigmoidoscopy or colonoscopy. You may have a sigmoidoscopy every 5 years or a colonoscopy every 10 years starting at age 59. Prostate cancer screening. Recommendations will vary depending on your family history and other risks. Hepatitis C blood test. Hepatitis B blood test. Sexually transmitted disease (STD) testing. Diabetes screening. This is done by checking your blood sugar (glucose) after you have not eaten for a while (fasting). You may have this done every 1-3 years. Abdominal aortic aneurysm (AAA) screening.  You may need this if you are a current or former smoker. Osteoporosis. You may be screened starting at age 66 if you are at high risk. Talk with your health care provider about your test results, treatment options, and if necessary, the need for more tests. Vaccines  Your health care provider may recommend certain vaccines, such as: Influenza vaccine. This is recommended every year. Tetanus, diphtheria, and acellular pertussis (Tdap, Td) vaccine. You may need a Td booster every 10 years. Zoster vaccine. You may need this after age 82. Pneumococcal 13-valent conjugate (PCV13) vaccine. One dose is recommended after age 78. Pneumococcal polysaccharide (PPSV23) vaccine. One dose is recommended after age 22. Talk to your health care provider about which screenings and vaccines you need and how often you need them. This  information is not intended to replace advice given to you by your health care provider. Make sure you discuss any questions you have with your health care provider. Document Released: 12/21/2015 Document Revised: 08/13/2016 Document Reviewed: 09/25/2015 Elsevier Interactive Patient Education  2017 [employer] Prevention in the Home Falls can cause injuries. They can happen to people of all ages. There are many things you can do to make your home safe and to help prevent falls. What can I do on the outside of my home? Regularly fix the edges of walkways and driveways and fix any cracks. Remove anything that might make you trip as you walk through a door, such as a raised step or threshold. Trim any bushes or trees on the path to your home. Use bright outdoor lighting. Clear any walking paths of anything that might make someone trip, such as rocks or tools. Regularly check to see if handrails are loose or broken. Make sure that both sides of any steps have handrails. Any raised decks and porches should have guardrails on the edges. Have any leaves, snow, or ice cleared regularly. Use sand or salt on walking paths during winter. Clean up any spills in your garage right away. This includes oil or grease spills. What can I do in the bathroom? Use night lights. Install grab bars by the toilet and in the tub and shower. Do not use towel bars as grab bars. Use non-skid mats or decals in the tub or shower. If you need to sit down in the shower, use a plastic, non-slip stool. Keep the floor dry. Clean up any water that spills on the floor as soon as it happens. Remove soap buildup in the tub or shower regularly. Attach bath mats securely with double-sided non-slip rug tape. Do not have throw rugs and other things on the floor that can make you trip. What can I do in the bedroom? Use night lights. Make sure that you have a light by your bed that is easy to reach. Do not use any sheets or  blankets that are too big for your bed. They should not hang down onto the floor. Have a firm chair that has side arms. You can use this for support while you get dressed. Do not have throw rugs and other things on the floor that can make you trip. What can I do in the kitchen? Clean up any spills right away. Avoid walking on wet floors. Keep items that you use a lot in easy-to-reach places. If you need to reach something above you, use a strong step stool that has a grab bar. Keep electrical cords out of the way. Do not use floor polish or wax  that makes floors slippery. If you must use wax, use non-skid floor wax. Do not have throw rugs and other things on the floor that can make you trip. What can I do with my stairs? Do not leave any items on the stairs. Make sure that there are handrails on both sides of the stairs and use them. Fix handrails that are broken or loose. Make sure that handrails are as long as the stairways. Check any carpeting to make sure that it is firmly attached to the stairs. Fix any carpet that is loose or worn. Avoid having throw rugs at the top or bottom of the stairs. If you do have throw rugs, attach them to the floor with carpet tape. Make sure that you have a light switch at the top of the stairs and the bottom of the stairs. If you do not have them, ask someone to add them for you. What else can I do to help prevent falls? Wear shoes that: Do not have high heels. Have rubber bottoms. Are comfortable and fit you well. Are closed at the toe. Do not wear sandals. If you use a stepladder: Make sure that it is fully opened. Do not climb a closed stepladder. Make sure that both sides of the stepladder are locked into place. Ask someone to hold it for you, if possible. Clearly mark and make sure that you can see: Any grab bars or handrails. First and last steps. Where the edge of each step is. Use tools that help you move around (mobility aids) if they are  needed. These include: Canes. Walkers. Scooters. Crutches. Turn on the lights when you go into a dark area. Replace any light bulbs as soon as they burn out. Set up your furniture so you have a clear path. Avoid moving your furniture around. If any of your floors are uneven, fix them. If there are any pets around you, be aware of where they are. Review your medicines with your doctor. Some medicines can make you feel dizzy. This can increase your chance of falling. Ask your doctor what other things that you can do to help prevent falls. This information is not intended to replace advice given to you by your health care provider. Make sure you discuss any questions you have with your health care provider. Document Released: 09/20/2009 Document Revised: 05/01/2016 Document Reviewed: 12/29/2014 Elsevier Interactive Patient Education  2017 [employer].

## 2021-12-23 NOTE — Progress Notes (Signed)
Subjective:   [name] is a 76 y.o. male who presents for Medicare Annual/Subsequent preventive examination.  Review of Systems    No ROS Cardiac Risk Factors include: advanced age (>32men, >54 women);hypertension     Objective:    Today's Vitals   12/23/21 1250  BP: 132/72  Pulse: 68  Temp: 98.4 F (36.9 C)  TempSrc: Oral  SpO2: 98%  Weight: 191 lb 8 oz (86.9 kg)  Height: 6\' 3"  (1.905 m)   Body mass index is 23.94 kg/m.  Advanced Directives 12/23/2021 10/11/2021 06/06/2021 11/26/2020 04/09/2020 04/04/2020 03/23/2018  Does Patient Have a Medical Advance Directive? Yes Yes Yes Yes Yes Yes Yes  Type of Paramedic of [place];Living will - - [place];Living will Living will;Healthcare Power of Attorney Living will;Healthcare Power of Attorney -  Does patient want to make changes to medical advance directive? No - Patient declined - - No - Patient declined No - Patient declined No - Patient declined -  Copy of [place] in Chart? No - copy requested - - No - copy requested Yes - validated most recent copy scanned in chart (See row information) No - copy requested -  Would patient like information on creating a medical advance directive? - - - - No - Patient declined No - Patient declined -    Current Medications (verified) Outpatient Encounter Medications as of 12/23/2021  Medication Sig   acetaminophen (TYLENOL) 500 MG tablet Take 1,000 mg by mouth every 6 (six) hours as needed for moderate pain or headache.   amLODipine (NORVASC) 5 MG tablet TAKE ONE AND ONE-HALF TABLETS DAILY   cholecalciferol (VITAMIN D) 1000 units tablet Take 1,000 Units by mouth daily.    ciclopirox (PENLAC) 8 % solution Apply topically at bedtime. Apply over nail and surrounding skin. Apply daily over previous coat. After seven (7) days, may remove with alcohol and continue cycle.   Cyanocobalamin (VITAMIN B12) 500 MCG TABS Take 500 mcg by  mouth daily.    divalproex (DEPAKOTE) 125 MG DR tablet Take 1 tab at night for 1 week, then increase to 2 tablets at night   donepezil (ARICEPT) 10 MG tablet TAKE 1 TABLET AT BEDTIME   ezetimibe (ZETIA) 10 MG tablet TAKE 1 TABLET AT BEDTIME   fenofibrate (TRICOR) 145 MG tablet TAKE 1 TABLET AT BEDTIME   furosemide (LASIX) 20 MG tablet Take 1 tablet (20 mg total) by mouth daily. May take an extra dose 20mg  twice a week for swelling   losartan (COZAAR) 100 MG tablet TAKE 1 TABLET DAILY   nitroGLYCERIN (NITROSTAT) 0.4 MG SL tablet Place 1 tablet (0.4 mg total) under the tongue every 5 (five) minutes as needed for chest pain.   Olopatadine HCl (PATADAY OP) Place 1 drop into both eyes daily as needed (allergies).   rosuvastatin (CRESTOR) 40 MG tablet Take 1 tablet (40 mg total) by mouth daily.   traMADol (ULTRAM) 50 MG tablet Take 1 tablet (50 mg total) by mouth daily as needed.   warfarin (COUMADIN) 5 MG tablet TAKE AS DIRECTED BY COUMADIN CLINIC   No facility-administered encounter medications on file as of 12/23/2021.    Allergies (verified) Other and Latex   History: Past Medical History:  Diagnosis Date   Allergy    Arthritis    CAD (coronary artery disease) 1996   POST CABG   Cataracts, bilateral    Chicken pox    Dyslipidemia    Hyperlipidemia  Hypertension    Sinus bradycardia    Past Surgical History:  Procedure Laterality Date   BYPASS GRAFT  1996   CATARACT EXTRACTION Right    INGUINAL HERNIA REPAIR Right 04/09/2020   Procedure: LAPAROSCOPIC RIGHT INGUINAL HERNIA REPAIR WITH MESH;  Surgeon: Ralene Ok, MD;  Location: [place];  Service: General;  Laterality: Right;   REFRACTIVE SURGERY Right    SKIN BIOPSY     head x 2   UMBILICAL HERNIA REPAIR  2003   Family History  Problem Relation Age of Onset   Alzheimer's disease Mother    Cancer Father        prostate   Prostate cancer Father    AAA (abdominal aortic aneurysm) Sister    Hypertension Maternal  Grandmother    Hypertension Paternal Grandmother    Colon cancer Neg Hx    Esophageal cancer Neg Hx    Pancreatic cancer Neg Hx    Liver disease Neg Hx    Social History   Socioeconomic History   Marital status: Married    Spouse name: [name]   Number of children: 2   Years of education: COLLEGE   Highest education level: Not on file  Occupational History   Occupation: RETIRED  Tobacco Use   Smoking status: Former    Types: Cigarettes    Quit date: 05/29/1982    Years since quitting: 39.5   Smokeless tobacco: Never  Substance and Sexual Activity   Alcohol use: Yes    Comment: 0-2 drinks a night   Drug use: No   Sexual activity: Not on file  Other Topics Concern   Not on file  Social History Narrative   Right handed   Social Determinants of Health   Financial Resource Strain: Low Risk    Difficulty of Paying Living Expenses: Not hard at all  Food Insecurity: No Food Insecurity   Worried About Charity fundraiser in the Last Year: Never true   Ran Out of Food in the Last Year: Never true  Transportation Needs: No Transportation Needs   Lack of Transportation (Medical): No   Lack of Transportation (Non-Medical): No  Physical Activity: Sufficiently Active   Days of Exercise per Week: 7 days   Minutes of Exercise per Session: 30 min  Stress: No Stress Concern Present   Feeling of Stress : Not at all  Social Connections: Moderately Isolated   Frequency of Communication with Friends and Family: More than three times a week   Frequency of Social Gatherings with Friends and Family: More than three times a week   Attends Religious Services: Never   Marine scientist or Organizations: No   Attends Music therapist: Never   Marital Status: Married    Clinical Intake:  Pre-visit preparation completed: Yes How often do you need to have someone help you when you read instructions, pamphlets, or other written materials from your doctor or pharmacy?: 1 -  Never  Diabetic? No   Activities of Daily Living In your present state of health, do you have any difficulty performing the following activities: 12/23/2021 12/23/2021  Hearing? N N  Vision? N N  Difficulty concentrating or making decisions? N Y  Walking or climbing stairs? N Y  Dressing or bathing? N N  Doing errands, shopping? N Y  Conservation officer, nature and eating ? N N  Using the Toilet? N N  In the past six months, have you accidently leaked urine? N N  Do you have  problems with loss of bowel control? N N  Managing your Medications? N N  Managing your Finances? N N  Housekeeping or managing your Housekeeping? N N  Some recent data might be hidden    Patient Care Team: [place], Betty G, MD as PCP - General (Family Medicine) Jettie Booze, MD as PCP - Cardiology (Cardiology)  Indicate any recent Medical Services you may have received from other than [facility] providers in the past year (date may be approximate).     Assessment:   This is a routine wellness examination for Jasiri.  Hearing/Vision screen Hearing Screening - Comments:: No difficulty hearing Vision Screening - Comments:: Wears reading glasses. Follow[facility] by [facility]  Dietary issues and exercise activities discussed: Current Exercise Habits: Home exercise routine, Type of exercise: stretching, Time (Minutes): 30, Frequency (Times/Week): 5, Weekly Exercise (Minutes/Week): 150, Intensity: Moderate   Goals Addressed             This Visit's Progress    Patient Stated       Get back on the walker to walk more  Can walk in the neighborhood.        Depression Screen PHQ 2/9 Scores 12/23/2021 12/23/2021 11/05/2021 11/26/2020 08/28/2020 03/29/2019 03/23/2018  PHQ - 2 Score 0 0 0 0 0 0 0  PHQ- 9 Score - - - 0 - - -    Fall Risk Fall Risk  12/23/2021 12/23/2021 10/11/2021 06/06/2021 12/06/2020  Falls in the past year? 0 0 0 0 0  Number falls in past yr: 0 - 0 0 0  Injury with Fall? 0 - 0 0 0  Risk for fall due  to : - - - - -  Follow up - - - - -    [place]:  Any stairs in or around the home? Yes  If so, are there any without handrails? No  Home free of loose throw rugs in walkways, pet beds, electrical cords, etc? Yes  Adequate lighting in your home to reduce risk of falls? Yes   ASSISTIVE DEVICES UTILIZED TO PREVENT FALLS:  Life alert? No  Use of a cane, walker or w/c? Yes  Grab bars in the bathroom? Yes  Shower chair or bench in shower? Yes  Elevated toilet seat or a handicapped toilet? Yes   TIMED UP AND GO:  Was the test performed? Yes .  Length of time to ambulate 10 feet: 5 sec.  Gait steady with the use of device.  Cognitive Function: MMSE - Mini Mental State Exam 03/23/2018  Not completed: (No Data)   Montreal Cognitive Assessment  10/11/2021  Visuospatial/ Executive (0/5) 0  Naming (0/3) 2  Attention: Read list of digits (0/2) 2  Attention: Read list of letters (0/1) 1  Attention: Serial 7 subtraction starting at 100 (0/3) 2  Language: Repeat phrase (0/2) 0  Language : Fluency (0/1) 1  Abstraction (0/2) 2  Delayed Recall (0/5) 2  Orientation (0/6) 5  Total 17  Adjusted Score (based on education) 17   6CIT Screen 12/23/2021  What Year? 0 points  What month? 0 points  What time? 0 points  Count back from 20 0 points  Months in reverse 2 points  Repeat phrase 0 points  Total Score 2    Immunizations Immunization History  Administered Date(s) Administered   Fluad Quad(high Dose 65+) 11/05/2021   Influenza,inj,Quad PF,6+ Mos 10/16/2017, 09/20/2018, 09/06/2019, 11/26/2020   PFIZER Comirnaty(Gray Top)Covid-19 Tri-Sucrose Vaccine 05/14/2021  PFIZER(Purple Top)SARS-COV-2 Vaccination 01/15/2020, 02/09/2020, 09/22/2020   Pfizer Covid-19 Vaccine Bivalent Booster 45yrs & up 10/22/2021   Pneumococcal Conjugate-13 11/15/2015   Pneumococcal-Unspecified 11/08/2014   Qualifies for Shingles Vaccine? Yes   Zostavax completed No    Shingrix Completed?: No.    Education has been provided regarding the importance of this vaccine. Patient has been advised to call insurance company to determine out of pocket expense if they have not yet received this vaccine. Advised may also receive vaccine at local pharmacy or Health Dept. Verbalized acceptance and understanding.  Screening Tests Health Maintenance  Topic Date Due   Zoster Vaccines- Shingrix (1 of 2) 03/23/2022 (Originally 10/31/1997)   COLONOSCOPY (Pts 45-69yrs Insurance coverage will need to be confirmed)  06/13/2022   INFLUENZA VACCINE  Completed   COVID-19 Vaccine  Completed   Hepatitis C Screening  Completed   HPV VACCINES  Aged Out   Pneumonia Vaccine 60+ Years old  Discontinued   TETANUS/TDAP  Discontinued    Health Maintenance  There are no preventive care reminders to display for this patient.   Additional Screening:   Vision Screening: Recommended annual ophthalmology exams for early detection of glaucoma and other disorders of the eye. Is the patient up to date with their annual eye exam?  Yes  Who is the provider or what is the name of the office in which the patient attends annual eye exams? Followed by [facility]  Dental Screening: Recommended annual dental exams for proper oral hygiene  Community Resource Referral / Chronic Care Management:  CRR required this visit?  No   CCM required this visit?  No      Plan:     I have personally reviewed and noted the following in the patients chart:   Medical and social history Use of alcohol, tobacco or illicit drugs  Current medications and supplements including opioid prescriptions. Patient is currently taking opioid prescriptions. Information provided to patient regarding non-opioid alternatives. Patient advised to discuss non-opioid treatment plan with their provider. Functional ability and status Nutritional status Physical activity Advanced directives List of other  physicians Hospitalizations, surgeries, and ER visits in previous 12 months Vitals Screenings to include cognitive, depression, and falls Referrals and appointments  In addition, I have reviewed and discussed with patient certain preventive protocols, quality metrics, and best practice recommendations. A written personalized care plan for preventive services as well as general preventive health recommendations were provided to patient.     Criselda Peaches, LPN   0/09/2724   Nurse notes: Patient request Tramadol refill

## 2021-12-24 MED ORDER — TRAMADOL HCL 50 MG PO TABS: 50.0000 mg | ORAL_TABLET | Freq: Every day | ORAL | 1 refills | Status: DC | PRN

## 2022-01-02 ENCOUNTER — Other Ambulatory Visit: Payer: Self-pay

## 2022-01-02 ENCOUNTER — Ambulatory Visit (INDEPENDENT_AMBULATORY_CARE_PROVIDER_SITE_OTHER): Payer: Medicare Other

## 2022-01-02 DIAGNOSIS — Z5181 Encounter for therapeutic drug level monitoring: Secondary | ICD-10-CM | POA: Diagnosis not present

## 2022-01-02 DIAGNOSIS — D6859 Other primary thrombophilia: Secondary | ICD-10-CM

## 2022-01-02 LAB — POCT INR: INR: 2.4 (ref 2.0–3.0)

## 2022-01-02 NOTE — Patient Instructions (Signed)
Description   Continue taking 1 tablet daily except for 1/2 tablet on Mondays, Wednesday and Fridays. Recheck INR in 6 weeks. [facility]madin Clinic [phone]

## 2022-01-06 ENCOUNTER — Other Ambulatory Visit: Payer: Self-pay | Admitting: Interventional Cardiology

## 2022-01-06 DIAGNOSIS — M5022 Other cervical disc displacement, mid-cervical region, unspecified level: Secondary | ICD-10-CM | POA: Diagnosis not present

## 2022-01-06 DIAGNOSIS — M9903 Segmental and somatic dysfunction of lumbar region: Secondary | ICD-10-CM | POA: Diagnosis not present

## 2022-01-06 DIAGNOSIS — M9901 Segmental and somatic dysfunction of cervical region: Secondary | ICD-10-CM | POA: Diagnosis not present

## 2022-01-06 DIAGNOSIS — M9902 Segmental and somatic dysfunction of thoracic region: Secondary | ICD-10-CM | POA: Diagnosis not present

## 2022-01-06 DIAGNOSIS — M5032 Other cervical disc degeneration, mid-cervical region, unspecified level: Secondary | ICD-10-CM | POA: Diagnosis not present

## 2022-01-06 DIAGNOSIS — M5124 Other intervertebral disc displacement, thoracic region: Secondary | ICD-10-CM | POA: Diagnosis not present

## 2022-01-07 ENCOUNTER — Ambulatory Visit: Payer: Medicare Other | Admitting: Interventional Cardiology

## 2022-01-13 ENCOUNTER — Ambulatory Visit (INDEPENDENT_AMBULATORY_CARE_PROVIDER_SITE_OTHER): Payer: Medicare Other | Admitting: Physician Assistant

## 2022-01-13 ENCOUNTER — Other Ambulatory Visit: Payer: Self-pay

## 2022-01-13 ENCOUNTER — Encounter: Payer: Self-pay | Admitting: Physician Assistant

## 2022-01-13 VITALS — BP 143/67 | HR 58 | Resp 20 | Ht 73.0 in | Wt 194.0 lb

## 2022-01-13 DIAGNOSIS — F0670 Mild neurocognitive disorder due to known physiological condition without behavioral disturbance: Secondary | ICD-10-CM

## 2022-01-13 DIAGNOSIS — G309 Alzheimer's disease, unspecified: Secondary | ICD-10-CM | POA: Diagnosis not present

## 2022-01-13 DIAGNOSIS — M9903 Segmental and somatic dysfunction of lumbar region: Secondary | ICD-10-CM | POA: Diagnosis not present

## 2022-01-13 DIAGNOSIS — M9902 Segmental and somatic dysfunction of thoracic region: Secondary | ICD-10-CM | POA: Diagnosis not present

## 2022-01-13 DIAGNOSIS — F02818 Dementia in other diseases classified elsewhere, unspecified severity, with other behavioral disturbance: Secondary | ICD-10-CM | POA: Diagnosis not present

## 2022-01-13 DIAGNOSIS — M9901 Segmental and somatic dysfunction of cervical region: Secondary | ICD-10-CM | POA: Diagnosis not present

## 2022-01-13 DIAGNOSIS — M5124 Other intervertebral disc displacement, thoracic region: Secondary | ICD-10-CM | POA: Diagnosis not present

## 2022-01-13 DIAGNOSIS — M5032 Other cervical disc degeneration, mid-cervical region, unspecified level: Secondary | ICD-10-CM | POA: Diagnosis not present

## 2022-01-13 DIAGNOSIS — G301 Alzheimer's disease with late onset: Secondary | ICD-10-CM | POA: Diagnosis not present

## 2022-01-13 DIAGNOSIS — M5022 Other cervical disc displacement, mid-cervical region, unspecified level: Secondary | ICD-10-CM | POA: Diagnosis not present

## 2022-01-13 NOTE — Patient Instructions (Addendum)
It was a pleasure to see you today at our office.   Recommendations:  Meds: Follow up in  6 months Continue Aricept 10 mg daily   RECOMMENDATIONS FOR ALL PATIENTS WITH MEMORY PROBLEMS: 1. Continue to exercise (Recommend 30 minutes of walking everyday, or 3 hours every week) 2. Increase social interactions - continue going to [place] and enjoy social gatherings with friends and family 3. Eat healthy, avoid fried foods and eat more fruits and vegetables 4. Maintain adequate blood pressure, blood sugar, and blood cholesterol level. Reducing the risk of stroke and cardiovascular disease also helps promoting better memory. 5. Avoid stressful situations. Live a simple life and avoid aggravations. Organize your time and prepare for the next day in anticipation. 6. Sleep well, avoid any interruptions of sleep and avoid any distractions in the bedroom that may interfere with adequate sleep quality 7. Avoid sugar, avoid sweets as there is a strong link between excessive sugar intake, diabetes, and cognitive impairment We discussed the Mediterranean diet, which has been shown to help patients reduce the risk of progressive memory disorders and reduces cardiovascular risk. This includes eating fish, eat fruits and green leafy vegetables, nuts like almonds and hazelnuts, walnuts, and also use olive oil. Avoid fast foods and fried foods as much as possible. Avoid sweets and sugar as sugar use has been linked to worsening of memory function.  There is always a concern of gradual progression of memory problems. If this is the case, then we may need to adjust level of care according to patient needs. Support, both to the patient and caregiver, should then be put into place.    The Alzheimers Association is here all day, every day for people facing Alzheimers disease through our free 24/7 Helpline: (630) 251-5035. The Helpline provides reliable information and support to all those who need assistance, such as  individuals living with memory loss, Alzheimer's or other dementia, caregivers, health care professionals and the public.  Our highly trained and knowledgeable staff can help you with: Understanding memory loss, dementia and Alzheimer's  Medications and other treatment options  General information about aging and brain health  Skills to provide quality care and to find the best care from professionals  Legal, financial and living-arrangement decisions Our Helpline also features: Confidential care consultation provided by master's level clinicians who can help with decision-making support, crisis assistance and education on issues families face every day  Help in a caller's preferred language using our translation service that features more than 200 languages and dialects  Referrals to local community programs, services and ongoing support     FALL PRECAUTIONS: Be cautious when walking. Scan the area for obstacles that may increase the risk of trips and falls. When getting up in the mornings, sit up at the edge of the bed for a few minutes before getting out of bed. Consider elevating the bed at the head end to avoid drop of blood pressure when getting up. Walk always in a well-lit room (use night lights in the walls). Avoid area rugs or power cords from appliances in the middle of the walkways. Use a walker or a cane if necessary and consider physical therapy for balance exercise. Get your eyesight checked regularly.  FINANCIAL OVERSIGHT: Supervision, especially oversight when making financial decisions or transactions is also recommended.  HOME SAFETY: Consider the safety of the kitchen when operating appliances like stoves, microwave oven, and blender. Consider having supervision and share cooking responsibilities until no longer able to participate in those.  Accidents with firearms and other hazards in the house should be identified and addressed as well.   ABILITY TO BE LEFT ALONE: If  patient is unable to contact 911 operator, consider using LifeLine, or when the need is there, arrange for someone to stay with patients. Smoking is a fire hazard, consider supervision or cessation. Risk of wandering should be assessed by caregiver and if detected at any point, supervision and safe proof recommendations should be instituted.  MEDICATION SUPERVISION: Inability to self-administer medication needs to be constantly addressed. Implement a mechanism to ensure safe administration of the medications.       Mediterranean Diet A Mediterranean diet refers to food and lifestyle choices that are based on the traditions of countries located on the [employer]. This way of eating has been shown to help prevent certain conditions and improve outcomes for people who have chronic diseases, like kidney disease and heart disease. What are tips for following this plan? Lifestyle  Cook and eat meals together with your family, when possible. Drink enough fluid to keep your urine clear or pale yellow. Be physically active every day. This includes: Aerobic exercise like running or swimming. Leisure activities like gardening, walking, or housework. Get 7-8 hours of sleep each night. If recommended by your health care provider, drink red wine in moderation. This means 1 glass a day for nonpregnant women and 2 glasses a day for men. A glass of wine equals 5 oz (150 mL). Reading food labels  Check the serving size of packaged foods. For foods such as rice and pasta, the serving size refers to the amount of cooked product, not dry. Check the total fat in packaged foods. Avoid foods that have saturated fat or trans fats. Check the ingredients list for added sugars, such as corn syrup. Shopping  At the grocery store, buy most of your food from the areas near the walls of the store. This includes: Fresh fruits and vegetables (produce). Grains, beans, nuts, and seeds. Some of these may be available in  unpackaged forms or large amounts (in bulk). Fresh seafood. Poultry and eggs. Low-fat dairy products. Buy whole ingredients instead of prepackaged foods. Buy fresh fruits and vegetables in-season from local farmers markets. Buy frozen fruits and vegetables in resealable bags. If you do not have access to quality fresh seafood, buy precooked frozen shrimp or canned fish, such as tuna, salmon, or sardines. Buy small amounts of raw or cooked vegetables, salads, or olives from the deli or salad bar at your store. Stock your pantry so you always have certain foods on hand, such as olive oil, canned tuna, canned tomatoes, rice, pasta, and beans. Cooking  Cook foods with extra-virgin olive oil instead of using butter or other vegetable oils. Have meat as a side dish, and have vegetables or grains as your main dish. This means having meat in small portions or adding small amounts of meat to foods like pasta or stew. Use beans or vegetables instead of meat in common dishes like chili or lasagna. Experiment with different cooking methods. Try roasting or broiling vegetables instead of steaming or sauteing them. Add frozen vegetables to soups, stews, pasta, or rice. Add nuts or seeds for added healthy fat at each meal. You can add these to yogurt, salads, or vegetable dishes. Marinate fish or vegetables using olive oil, lemon juice, garlic, and fresh herbs. Meal planning  Plan to eat 1 vegetarian meal one day each week. Try to work up to 2 vegetarian meals, if  possible. Eat seafood 2 or more times a week. Have healthy snacks readily available, such as: Vegetable sticks with hummus. Greek yogurt. Fruit and nut trail mix. Eat balanced meals throughout the week. This includes: Fruit: 2-3 servings a day Vegetables: 4-5 servings a day Low-fat dairy: 2 servings a day Fish, poultry, or lean meat: 1 serving a day Beans and legumes: 2 or more servings a week Nuts and seeds: 1-2 servings a day Whole  grains: 6-8 servings a day Extra-virgin olive oil: 3-4 servings a day Limit red meat and sweets to only a few servings a month What are my food choices? Mediterranean diet Recommended Grains: Whole-grain pasta. Brown rice. Bulgar wheat. Polenta. Couscous. Whole-wheat bread. [place]. Vegetables: Artichokes. Beets. Broccoli. Cabbage. Carrots. Eggplant. Green beans. Chard. Kale. Spinach. Onions. Leeks. Peas. Squash. Tomatoes. Peppers. Radishes. Fruits: Apples. Apricots. Avocado. Berries. Bananas. Cherries. Dates. Figs. Grapes. Lemons. Melon. Oranges. Peaches. Plums. Pomegranate. Meats and other protein foods: Beans. Almonds. Sunflower seeds. Pine nuts. Peanuts. [place]. Salmon. Scallops. Shrimp. [place]. Tilapia. Clams. Oysters. Eggs. Dairy: Low-fat milk. Cheese. Greek yogurt. Beverages: Water. Red wine. Herbal tea. Fats and oils: Extra virgin olive oil. Avocado oil. Grape seed oil. Sweets and desserts: [place] yogurt with honey. Baked apples. Poached pears. Trail mix. Seasoning and other foods: Basil. Cilantro. Coriander. Cumin. Mint. Parsley. Sage. Rosemary. Tarragon. Garlic. Oregano. Thyme. Pepper. Balsalmic vinegar. Tahini. Hummus. Tomato sauce. Olives. Mushrooms. Limit these Grains: Prepackaged pasta or rice dishes. Prepackaged cereal with added sugar. Vegetables: Deep fried potatoes (french fries). Fruits: Fruit canned in syrup. Meats and other protein foods: Beef. Pork. Lamb. Poultry with skin. Hot dogs. [place] Salines. Dairy: Ice cream. Sour cream. Whole milk. Beverages: Juice. Sugar-sweetened soft drinks. Beer. Liquor and spirits. Fats and oils: Butter. Canola oil. Vegetable oil. Beef fat (tallow). Lard. Sweets and desserts: Cookies. Cakes. Pies. Candy. Seasoning and other foods: Mayonnaise. Premade sauces and marinades. The items listed may not be a complete list. Talk with your dietitian about what dietary choices are right for you. Summary The Mediterranean diet includes both food and  lifestyle choices. Eat a variety of fresh fruits and vegetables, beans, nuts, seeds, and whole grains. Limit the amount of red meat and sweets that you eat. Talk with your health care provider about whether it is safe for you to drink red wine in moderation. This means 1 glass a day for nonpregnant women and 2 glasses a day for men. A glass of wine equals 5 oz (150 mL). This information is not intended to replace advice given to you by your health care provider. Make sure you discuss any questions you have with your health care provider. Document Released: 07/17/2016 Document Revised: 08/19/2016 Document Reviewed: 07/17/2016 Elsevier Interactive Patient Education  2017 [employer].

## 2022-01-13 NOTE — Progress Notes (Signed)
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  He is on donepezil 10 mg daily, tolerating well.  For mood and hallucinations he is on Depakote 125 mg nightly.  Recommendations:  Discussed safety both in and out of the home.  Discussed the importance of regular daily schedule with inclusion of crossword puzzles to maintain brain function.  Continue to monitor mood.  Continue Depakote 250 mg p.o. nightly for hallucinations.  Side effects were discussed Stay active at least 30 minutes at least 3 times a week.  Naps should be scheduled and should be no longer than 60 minutes and should not occur after 2 PM.  Mediterranean diet is recommended  Continue donepezil 10 mg daily Side effects were discussed Close monitoring of the blood pressure and all of the cardiovascular risk factors are recommended. Follow up in 3 months    Case discussed with Dr.Jaffe who agrees with the plan      [name] is a 76 y.o. RH male with a history of osteoarthritis, CAD, hypertension, and history of mild neurocognitive disorder likely due to Alzheimer's disease, seen today in follow up for memory loss.  His last visit was on 10/11/21. This patient is accompanied in the office by his wife who supplements the history.  Previous records as well as any outside records available were reviewed prior to todays visit.  Patient is currently on donepezil 10 mg daily, tolerating well. Since his last visit, although the patient reports that his memory is "about the same, but the short-term memory is iffy ". The long-term memory is good.  He continues to repeat himself.  He denies leaving objects in unusual places.  He no longer drives.  His mood is good, denies any depression, or irritability.  "He still keeps his sense of humor "-his wife says.  His mobility is limited, due to significant kyphosis, and other spinal issues,  limiting his ability to walk, which can be frustrating at times per patient report.  He denies any falls or head trauma.  His wife states that he may be walking "a little bit slower than before ".  He tried physical therapy, which "made things worse ", therefore he learned the exercises, and he does them daily by himself.  Most of the day, he spends it in the kitchen, experimenting with new recipes "until I get in".  His wife supervises him.  He still manages his medications with wife supervision.  "He knows that when the sun goes down, he has to prepare everything for the morning and the following evening ". He does not enjoy doing crossword puzzles or word finding.  He does enjoy watching TV, especially trivia shows like jeopardy.  He has insomnia, occasionally he has vivid dreams, but no reported REM behavior or sleepwalking.   Occasionally he experiences visual hallucinations, such as a leg of a chair moving, "he laughs, because he realized that it was no a real leg ".  No paranoia is reported.  There are no hygiene concerns, he is independent of bathing and dressing.  His wife is in charge of the finances.  His appetite is good, no further weight loss.  He denies trouble swallowing. Denies headaches, double vision, dizziness, focal numbness or tingling, falls, unilateral weakness or tremors or anosmia.  He sees a chiropractor occasionally for his lower back. Denies urine incontinence, he has issues with retention, followed by PCP, denies constipation  or diarrhea.   HISTORY 12/06/2020: His wife has noted memory problems since 2020 that has gradually progressed.  He started having trouble calculating the checkbook and would be off by several hundreds of dollars.  He also quickly forgets conversations and would repeat questions after just minutes.  He hasn't driven for years but as a passenger he denies disorientation on familiar routes. On a recent cruise, he had trouble keeping up with the schedule and  activities.  He has a system to remember to take his medications independently.  No difficulty with performing home chores or activities.  At first, it was thought to be related to his chronic back/radicular pain but now the memory problems appear to be out of proportion to being in pain.     He has family history of Alzheimer's disease on his mother's side, including his mother, aunts, uncles and grandmother.  Labs on 10/02/2021 showed TSH 1.4, urine culture negative  PREVIOUS MEDICATIONS:   CURRENT MEDICATIONS:  Outpatient Encounter Medications as of 01/13/2022  Medication Sig   amLODipine (NORVASC) 5 MG tablet TAKE ONE AND ONE-HALF TABLETS DAILY   cholecalciferol (VITAMIN D) 1000 units tablet Take 1,000 Units by mouth daily.    donepezil (ARICEPT) 10 MG tablet TAKE 1 TABLET AT BEDTIME   ezetimibe (ZETIA) 10 MG tablet TAKE 1 TABLET AT BEDTIME   fenofibrate (TRICOR) 145 MG tablet TAKE 1 TABLET AT BEDTIME   furosemide (LASIX) 20 MG tablet Take 1 tablet (20 mg total) by mouth daily. May take an extra dose 20mg  twice a week for swelling   losartan (COZAAR) 100 MG tablet TAKE 1 TABLET DAILY   nitroGLYCERIN (NITROSTAT) 0.4 MG SL tablet Place 1 tablet (0.4 mg total) under the tongue every 5 (five) minutes as needed for chest pain.   Olopatadine HCl (PATADAY OP) Place 1 drop into both eyes daily as needed (allergies).   rosuvastatin (CRESTOR) 40 MG tablet Take 1 tablet (40 mg total) by mouth daily.   traMADol (ULTRAM) 50 MG tablet Take 1 tablet (50 mg total) by mouth daily as needed.   warfarin (COUMADIN) 5 MG tablet TAKE AS DIRECTED BY COUMADIN CLINIC   acetaminophen (TYLENOL) 500 MG tablet Take 1,000 mg by mouth every 6 (six) hours as needed for moderate pain or headache. (Patient not taking: Reported on 01/13/2022)   ciclopirox (PENLAC) 8 % solution Apply topically at bedtime. Apply over nail and surrounding skin. Apply daily over previous coat. After seven (7) days, may remove with alcohol and  continue cycle. (Patient not taking: Reported on 01/13/2022)   Cyanocobalamin (VITAMIN B12) 500 MCG TABS Take 500 mcg by mouth daily.    divalproex (DEPAKOTE) 125 MG DR tablet Take 1 tab at night for 1 week, then increase to 2 tablets at night   No facility-administered encounter medications on file as of 01/13/2022.     Objective:     PHYSICAL EXAMINATION:    VITALS:   Vitals:   01/13/22 1119  BP: (!) 143/67  Pulse: (!) 58  Resp: 20  SpO2: 97%  Weight: 194 lb (88 kg)  Height: 6\' 1"  (1.854 m)     GEN:  The patient appears stated age and is in NAD. HEENT:  Normocephalic, atraumatic.   Neurological examination:  General: NAD, well-groomed, appears stated age. Orientation: The patient is alert. Oriented to person, place and not to date Cranial nerves: There is good facial symmetry.The speech is fluent and clear. No aphasia or dysarthria. Fund of knowledge is appropriate. Recent  and remote memory are impaired. Attention and concentration are reduced.  Able to name objects and repeat phrases.  Hearing is intact to conversational tone.    Sensation: Sensation is intact to light touch throughout Motor: Strength is at least antigravity x4. Tremors: none  DTR's 1/4 in [place] Cognitive Assessment  10/11/2021  Visuospatial/ Executive (0/5) 0  Naming (0/3) 2  Attention: Read list of digits (0/2) 2  Attention: Read list of letters (0/1) 1  Attention: Serial 7 subtraction starting at 100 (0/3) 2  Language: Repeat phrase (0/2) 0  Language : Fluency (0/1) 1  Abstraction (0/2) 2  Delayed Recall (0/5) 2  Orientation (0/6) 5  Total 17  Adjusted Score (based on education) 17   MMSE - Mini Mental State Exam 03/23/2018  Not completed: (No Data)    [place] Exam 12/06/2020  Weekday Correct 1  Current year 1  What state are we in? 1  Amount spent 1  Amount left 0  # of Animals 2  5 objects recall 0  Number series 2  Hour markers 0  Time correct 0   Placed X in triangle correctly 1  Largest Figure 1  Name of male 2  Date back to work 2  Type of work 2  State she lived in 2  Total score 18       Movement examination: Tone: There is normal tone in the UE/LE. No cogwheeling. Pronounced kyphosis Abnormal movements:  no tremor.  No myoclonus.  No asterixis.   Coordination:  There is no decremation with RAM's. Normal finger to nose  Gait and Station: The patient has some difficulty arising out of a deep-seated chair without the use of the hands. The patient's stride length is short.  Gait is cautious and mildly wide based     Total time spent on today's visit was 30 minutes, including both face-to-face time and nonface-to-face time. Time included that spent on review of records (prior notes available to me/labs/imaging if pertinent), discussing treatment and goals, answering patient's questions and coordinating care.  Cc:  Martinique, Betty G, MD Sharene Butters, PA-C

## 2022-01-21 ENCOUNTER — Ambulatory Visit (INDEPENDENT_AMBULATORY_CARE_PROVIDER_SITE_OTHER): Payer: Medicare Other | Admitting: Podiatry

## 2022-01-21 ENCOUNTER — Other Ambulatory Visit: Payer: Self-pay

## 2022-01-21 ENCOUNTER — Other Ambulatory Visit: Payer: Self-pay | Admitting: Interventional Cardiology

## 2022-01-21 DIAGNOSIS — Z7901 Long term (current) use of anticoagulants: Secondary | ICD-10-CM | POA: Diagnosis not present

## 2022-01-21 DIAGNOSIS — M79674 Pain in right toe(s): Secondary | ICD-10-CM | POA: Diagnosis not present

## 2022-01-21 DIAGNOSIS — B351 Tinea unguium: Secondary | ICD-10-CM

## 2022-01-21 DIAGNOSIS — M79675 Pain in left toe(s): Secondary | ICD-10-CM | POA: Diagnosis not present

## 2022-01-21 NOTE — Progress Notes (Signed)
°  Subjective:  Patient ID: [name], male    DOB: Apr 23, 1946,  MRN: [id number]  Chief Complaint  Patient presents with   Nail Problem    Thick painful toenails, 6 week follow up    76 y.o. male returns with the above complaint. History confirmed with patient.  He is on warfarin doing well the intermittent debridement has been very helpful for him for the toenails, the calluses are no longer painful with his new orthoses   Objective:  Physical Exam: warm, good capillary refill, no trophic changes or ulcerative lesions, and normal DP and PT pulses.  He has loss of protective sensation bilaterally. thickened elongated painful toenails with yellow discoloration and subungual debris bilaterally x10 Assessment:   1. Pain due to onychomycosis of toenails of both feet   2. Anticoagulated       Plan:  Patient was evaluated and treated and all questions answered.  Calluses doing well and minimally symptomatic with his current offloading orthoses  Discussed the etiology and treatment options for the condition in detail with the patient. Educated patient on the topical and oral treatment options for mycotic nails. Recommended debridement of the nails today. Sharp and mechanical debridement performed of all painful and mycotic nails today. Nails debrided in length and thickness using a nail nipper to level of comfort. Discussed treatment options including appropriate shoe gear. Follow up as needed for painful nails.  Unfortunately due to the time interval between debridements was not covered at last visit.  We discussed the option of a medical pedicurist between visits.  A [facility] that they will try to see if they can get in for this every 5 weeks.  I will see him back every 10 weeks in between these for intermittent debridement.   Return in about 10 weeks (around 04/01/2022) for at risk nail care.

## 2022-01-23 NOTE — Progress Notes (Signed)
Cardiology Office Note   Date:  01/24/2022   ID:  [name], [name] 1946/02/02, MRN [id number]  PCP:  Martinique, Betty G, MD    No chief complaint on file.  CAD  Wt Readings from Last 3 Encounters:  01/24/22 190 lb 12.8 oz (86.5 kg)  01/13/22 194 lb (88 kg)  12/23/21 191 lb 8 oz (86.9 kg)       History of Present Illness: [name] is a 76 y.o. male  who had CABG in 1996.  He was being treated for HTN at the time.  He was on vacation and was cleaning and felt severe chest pain with activity.  He failed an ETT.  He had a cath showing 3 vessel disease and had CABG x 3 in 1996 ( LIMA was used.).  Since then, he has not had a cath.  He continued rehab for an extended period of time.  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   He moved to [place] in Jan 2018 from [place].    He uses a cane and walker to walk due to instability.  He has completed a physical therapy program.   Quit smoking 35 years ago.     He was diagnosed with a hypercoagulable state at the time of CABG and he has been on Coumadin since that time.  Nose bleed when INR increased.   He was found to have a markedly elevated fibrinopeptide A level, mild resistance to activated protein C and relative aspirin resistance with respect to ADP-induced platelet aggregation.   He was seen at [facility] for his neuropathy.  He has DJD of the spine.  He goes to the chiropracter.   His biggest issue is his back.  He was referred to a massage therapist. He has had some improvement, but still has some balance problems.  He has not fallen and uses a cane.     In 2021, it was noted: " No testing since that time. Some atypical chest pain that sounds more like muscle soreness.  Not like prior angina.  We discussed a lexiscan myoview, but they want to think about it."  In 2022, He did physical therapy, stretching, lifting.  Did some water exercises with PT.    Denies : Chest  pain. Dizziness. Leg edema. Nitroglycerin use. Orthopnea. Palpitations. Paroxysmal nocturnal dyspnea. Shortness of breath. Syncope.    Exertion has dropped off.  Does some PT.       Past Medical History:  Diagnosis Date   Allergy    Arthritis    CAD (coronary artery disease) 1996   POST CABG   Cataracts, bilateral    Chicken pox    Dyslipidemia    Hyperlipidemia    Hypertension    Sinus bradycardia     Past Surgical History:  Procedure Laterality Date   BYPASS GRAFT  1996   CATARACT EXTRACTION Right    INGUINAL HERNIA REPAIR Right 04/09/2020   Procedure: LAPAROSCOPIC RIGHT INGUINAL HERNIA REPAIR WITH MESH;  Surgeon: Ralene Ok, MD;  Location: [facility];  Service: General;  Laterality: Right;   REFRACTIVE SURGERY Right    SKIN BIOPSY     head x 2   UMBILICAL HERNIA REPAIR  2003     Current Outpatient Medications  Medication Sig Dispense Refill   amLODipine (NORVASC) 5 MG tablet TAKE ONE AND ONE-HALF TABLETS DAILY 135 tablet 1   cholecalciferol (VITAMIN D) 1000 units tablet Take 1,000  Units by mouth daily.      ciclopirox (PENLAC) 8 % solution Apply topically at bedtime. Apply over nail and surrounding skin. Apply daily over previous coat. After seven (7) days, may remove with alcohol and continue cycle. 6.6 mL 6   Cyanocobalamin (VITAMIN B12) 500 MCG TABS Take 500 mcg by mouth daily.      donepezil (ARICEPT) 10 MG tablet TAKE 1 TABLET AT BEDTIME 90 tablet 0   ezetimibe (ZETIA) 10 MG tablet TAKE 1 TABLET AT BEDTIME 90 tablet 1   fenofibrate (TRICOR) 145 MG tablet TAKE 1 TABLET AT BEDTIME 90 tablet 0   furosemide (LASIX) 20 MG tablet Take 1 tablet (20 mg total) by mouth daily. May take an extra dose 20mg  twice a week for swelling 90 tablet 3   losartan (COZAAR) 100 MG tablet TAKE 1 TABLET DAILY 90 tablet 3   nitroGLYCERIN (NITROSTAT) 0.4 MG SL tablet Place 1 tablet (0.4 mg total) under the tongue every 5 (five) minutes as needed for chest pain. 75 tablet 2   Olopatadine HCl  (PATADAY OP) Place 1 drop into both eyes daily as needed (allergies).     rosuvastatin (CRESTOR) 40 MG tablet TAKE 1 TABLET DAILY 90 tablet 0   traMADol (ULTRAM) 50 MG tablet Take 1 tablet (50 mg total) by mouth daily as needed. 30 tablet 1   warfarin (COUMADIN) 5 MG tablet TAKE AS DIRECTED BY COUMADIN CLINIC 80 tablet 3   acetaminophen (TYLENOL) 500 MG tablet Take 1,000 mg by mouth every 6 (six) hours as needed for moderate pain or headache. (Patient not taking: Reported on 01/24/2022)     divalproex (DEPAKOTE) 125 MG DR tablet Take 1 tab at night for 1 week, then increase to 2 tablets at night (Patient not taking: Reported on 01/24/2022) 180 tablet 3   No current facility-administered medications for this visit.    Allergies:   Other and Latex    Social History:  The patient  reports that he quit smoking about 39 years ago. His smoking use included cigarettes. He has never used smokeless tobacco. He reports current alcohol use. He reports that he does not use drugs.   Family History:  The patient's family history includes AAA (abdominal aortic aneurysm) in his sister; Alzheimer's disease in his mother; Cancer in his father; Hypertension in his maternal grandmother and paternal grandmother; Prostate cancer in his father.    ROS:  Please see the history of present illness.   Otherwise, review of systems are positive for back and neck pain.   All other systems are reviewed and negative.    PHYSICAL EXAM: VS:  BP 132/78    Pulse (!) 55    Ht 6\' 1"  (1.854 m)    Wt 190 lb 12.8 oz (86.5 kg)    SpO2 96%    BMI 25.17 kg/m  , BMI Body mass index is 25.17 kg/m. GEN: Well nourished, well developed, in no acute distress HEENT: right scleral bleeding Neck: no JVD, carotid bruits, or masses Cardiac: RRR; no murmurs, rubs, or gallops,; bilateral ankle edema R>L- chronic Respiratory:  clear to auscultation bilaterally, normal work of breathing GI: soft, nontender, nondistended, + BS MS: no deformity or  atrophy Skin: warm and dry, no rash Neuro:  Strength and sensation are intact Psych: euthymic mood, full affect   EKG:   The ekg ordered today demonstated NSR, QRS duration upper normal, no ST changes   Recent Labs: 10/02/2021: BUN 15; Creatinine, Ser 0.94; Hemoglobin 14.2; Platelets  236.0; Potassium 3.6; Sodium 141; TSH 1.40 11/05/2021: ALT 17   Lipid Panel    Component Value Date/Time   CHOL 128 11/05/2021 0845   CHOL 128 01/03/2021 0904   TRIG 47.0 11/05/2021 0845   HDL 60.60 11/05/2021 0845   HDL 56 01/03/2021 0904   CHOLHDL 2 11/05/2021 0845   VLDL 9.4 11/05/2021 0845   LDLCALC 58 11/05/2021 0845   LDLCALC 60 01/03/2021 0904     Other studies Reviewed: Additional studies/ records that were reviewed today with results demonstrating: labs reviewed.   ASSESSMENT AND PLAN:  CAD: Status post CABG in 1996 including LIMA to LAD.  No angina.  Try to increase activity to target noted below. Hypertension: The current medical regimen is effective;  continue present plan and medications.  Continue losartan and amlodipine. Hyperlipidemia: The current medical regimen is effective;  continue present plan and medications.  Continue high-dose rosuvastatin. Anticoagulated: Coumadin for acquired thrombophilia/hypercoagulable state.  Had scleral bleeding.  No GI/GU bleeding.  No internal bleeding. Lower extremity edema: Chronic.     Current medicines are reviewed at length with the patient today.  The patient concerns regarding his medicines were addressed.  The following changes have been made:  No change  Labs/ tests ordered today include:  No orders of the defined types were placed in this encounter.   Recommend 150 minutes/week of aerobic exercise Low fat, low carb, high fiber diet recommended  Disposition:   FU in 1 year   Signed, Larae Grooms, MD  01/24/2022 12:07 PM    [facility] [address], [place], [place]  [address] Phone: [phone]; Fax: [phone]

## 2022-01-24 ENCOUNTER — Other Ambulatory Visit: Payer: Self-pay

## 2022-01-24 ENCOUNTER — Ambulatory Visit (INDEPENDENT_AMBULATORY_CARE_PROVIDER_SITE_OTHER): Payer: Medicare Other | Admitting: Interventional Cardiology

## 2022-01-24 ENCOUNTER — Encounter: Payer: Self-pay | Admitting: Interventional Cardiology

## 2022-01-24 VITALS — BP 132/78 | HR 55 | Ht 73.0 in | Wt 190.8 lb

## 2022-01-24 DIAGNOSIS — I10 Essential (primary) hypertension: Secondary | ICD-10-CM

## 2022-01-24 DIAGNOSIS — M7989 Other specified soft tissue disorders: Secondary | ICD-10-CM

## 2022-01-24 DIAGNOSIS — D6869 Other thrombophilia: Secondary | ICD-10-CM

## 2022-01-24 DIAGNOSIS — E782 Mixed hyperlipidemia: Secondary | ICD-10-CM | POA: Diagnosis not present

## 2022-01-24 DIAGNOSIS — Z7901 Long term (current) use of anticoagulants: Secondary | ICD-10-CM

## 2022-01-24 DIAGNOSIS — I25118 Atherosclerotic heart disease of native coronary artery with other forms of angina pectoris: Secondary | ICD-10-CM | POA: Diagnosis not present

## 2022-01-24 MED ORDER — AMLODIPINE BESYLATE 5 MG PO TABS: 7.5000 mg | ORAL_TABLET | Freq: Every day | ORAL | 3 refills | Status: DC

## 2022-01-24 MED ORDER — ROSUVASTATIN CALCIUM 40 MG PO TABS: 40.0000 mg | ORAL_TABLET | Freq: Every day | ORAL | 3 refills | Status: DC

## 2022-01-24 MED ORDER — FUROSEMIDE 20 MG PO TABS: 20.0000 mg | ORAL_TABLET | Freq: Every day | ORAL | 3 refills | Status: DC

## 2022-01-24 MED ORDER — LOSARTAN POTASSIUM 100 MG PO TABS: 100.0000 mg | ORAL_TABLET | Freq: Every day | ORAL | 3 refills | Status: DC

## 2022-01-24 MED ORDER — FENOFIBRATE 145 MG PO TABS: 145.0000 mg | ORAL_TABLET | Freq: Every day | ORAL | 3 refills | Status: DC

## 2022-01-24 MED ORDER — EZETIMIBE 10 MG PO TABS: 10.0000 mg | ORAL_TABLET | Freq: Every day | ORAL | 3 refills | Status: DC

## 2022-01-24 MED ORDER — NITROGLYCERIN 0.4 MG SL SUBL: 0.4000 mg | SUBLINGUAL_TABLET | SUBLINGUAL | 2 refills | Status: DC | PRN

## 2022-01-24 NOTE — Patient Instructions (Signed)

## 2022-01-27 DIAGNOSIS — M9903 Segmental and somatic dysfunction of lumbar region: Secondary | ICD-10-CM | POA: Diagnosis not present

## 2022-01-27 DIAGNOSIS — M5124 Other intervertebral disc displacement, thoracic region: Secondary | ICD-10-CM | POA: Diagnosis not present

## 2022-01-27 DIAGNOSIS — M5022 Other cervical disc displacement, mid-cervical region, unspecified level: Secondary | ICD-10-CM | POA: Diagnosis not present

## 2022-01-27 DIAGNOSIS — M5032 Other cervical disc degeneration, mid-cervical region, unspecified level: Secondary | ICD-10-CM | POA: Diagnosis not present

## 2022-01-27 DIAGNOSIS — M9901 Segmental and somatic dysfunction of cervical region: Secondary | ICD-10-CM | POA: Diagnosis not present

## 2022-01-27 DIAGNOSIS — M9902 Segmental and somatic dysfunction of thoracic region: Secondary | ICD-10-CM | POA: Diagnosis not present

## 2022-02-03 DIAGNOSIS — M9902 Segmental and somatic dysfunction of thoracic region: Secondary | ICD-10-CM | POA: Diagnosis not present

## 2022-02-03 DIAGNOSIS — M5032 Other cervical disc degeneration, mid-cervical region, unspecified level: Secondary | ICD-10-CM | POA: Diagnosis not present

## 2022-02-03 DIAGNOSIS — M9901 Segmental and somatic dysfunction of cervical region: Secondary | ICD-10-CM | POA: Diagnosis not present

## 2022-02-03 DIAGNOSIS — M9903 Segmental and somatic dysfunction of lumbar region: Secondary | ICD-10-CM | POA: Diagnosis not present

## 2022-02-03 DIAGNOSIS — M5124 Other intervertebral disc displacement, thoracic region: Secondary | ICD-10-CM | POA: Diagnosis not present

## 2022-02-03 DIAGNOSIS — M5022 Other cervical disc displacement, mid-cervical region, unspecified level: Secondary | ICD-10-CM | POA: Diagnosis not present

## 2022-02-13 ENCOUNTER — Ambulatory Visit (INDEPENDENT_AMBULATORY_CARE_PROVIDER_SITE_OTHER): Payer: Medicare Other

## 2022-02-13 ENCOUNTER — Other Ambulatory Visit: Payer: Self-pay

## 2022-02-13 DIAGNOSIS — D6859 Other primary thrombophilia: Secondary | ICD-10-CM | POA: Diagnosis not present

## 2022-02-13 DIAGNOSIS — Z5181 Encounter for therapeutic drug level monitoring: Secondary | ICD-10-CM | POA: Diagnosis not present

## 2022-02-13 LAB — POCT INR: INR: 2 (ref 2.0–3.0)

## 2022-02-13 NOTE — Patient Instructions (Signed)
Description   ?Take 1.5 tablets today and then continue taking 1 tablet daily except for 1/2 tablet on Mondays, Wednesday and Fridays. Recheck INR in 6 weeks. [facility]in Clinic [phone] ?  ?   ?

## 2022-02-17 DIAGNOSIS — M5022 Other cervical disc displacement, mid-cervical region, unspecified level: Secondary | ICD-10-CM | POA: Diagnosis not present

## 2022-02-17 DIAGNOSIS — M5124 Other intervertebral disc displacement, thoracic region: Secondary | ICD-10-CM | POA: Diagnosis not present

## 2022-02-17 DIAGNOSIS — M9901 Segmental and somatic dysfunction of cervical region: Secondary | ICD-10-CM | POA: Diagnosis not present

## 2022-02-17 DIAGNOSIS — M9902 Segmental and somatic dysfunction of thoracic region: Secondary | ICD-10-CM | POA: Diagnosis not present

## 2022-02-17 DIAGNOSIS — M5032 Other cervical disc degeneration, mid-cervical region, unspecified level: Secondary | ICD-10-CM | POA: Diagnosis not present

## 2022-02-17 DIAGNOSIS — M9903 Segmental and somatic dysfunction of lumbar region: Secondary | ICD-10-CM | POA: Diagnosis not present

## 2022-03-03 DIAGNOSIS — M9901 Segmental and somatic dysfunction of cervical region: Secondary | ICD-10-CM | POA: Diagnosis not present

## 2022-03-03 DIAGNOSIS — M5022 Other cervical disc displacement, mid-cervical region, unspecified level: Secondary | ICD-10-CM | POA: Diagnosis not present

## 2022-03-03 DIAGNOSIS — M5124 Other intervertebral disc displacement, thoracic region: Secondary | ICD-10-CM | POA: Diagnosis not present

## 2022-03-03 DIAGNOSIS — M5032 Other cervical disc degeneration, mid-cervical region, unspecified level: Secondary | ICD-10-CM | POA: Diagnosis not present

## 2022-03-03 DIAGNOSIS — M9902 Segmental and somatic dysfunction of thoracic region: Secondary | ICD-10-CM | POA: Diagnosis not present

## 2022-03-03 DIAGNOSIS — M9903 Segmental and somatic dysfunction of lumbar region: Secondary | ICD-10-CM | POA: Diagnosis not present

## 2022-03-04 ENCOUNTER — Other Ambulatory Visit: Payer: Self-pay | Admitting: Neurology

## 2022-03-04 DIAGNOSIS — F0670 Mild neurocognitive disorder due to known physiological condition without behavioral disturbance: Secondary | ICD-10-CM

## 2022-03-18 DIAGNOSIS — M5022 Other cervical disc displacement, mid-cervical region, unspecified level: Secondary | ICD-10-CM | POA: Diagnosis not present

## 2022-03-18 DIAGNOSIS — M5124 Other intervertebral disc displacement, thoracic region: Secondary | ICD-10-CM | POA: Diagnosis not present

## 2022-03-18 DIAGNOSIS — M9901 Segmental and somatic dysfunction of cervical region: Secondary | ICD-10-CM | POA: Diagnosis not present

## 2022-03-18 DIAGNOSIS — M9903 Segmental and somatic dysfunction of lumbar region: Secondary | ICD-10-CM | POA: Diagnosis not present

## 2022-03-18 DIAGNOSIS — M9902 Segmental and somatic dysfunction of thoracic region: Secondary | ICD-10-CM | POA: Diagnosis not present

## 2022-03-18 DIAGNOSIS — M5032 Other cervical disc degeneration, mid-cervical region, unspecified level: Secondary | ICD-10-CM | POA: Diagnosis not present

## 2022-03-27 ENCOUNTER — Ambulatory Visit (INDEPENDENT_AMBULATORY_CARE_PROVIDER_SITE_OTHER): Payer: Medicare Other | Admitting: *Deleted

## 2022-03-27 DIAGNOSIS — D6859 Other primary thrombophilia: Secondary | ICD-10-CM | POA: Diagnosis not present

## 2022-03-27 DIAGNOSIS — Z5181 Encounter for therapeutic drug level monitoring: Secondary | ICD-10-CM | POA: Diagnosis not present

## 2022-03-27 LAB — POCT INR: INR: 1.8 — AB (ref 2.0–3.0)

## 2022-03-27 NOTE — Patient Instructions (Signed)
Description   ?Take 1.5 tablets today and then continue taking 1 tablet daily except for 1/2 tablet on Mondays, Wednesday and Fridays. Recheck INR in 5 weeks. [facility]in Clinic [phone] ?  ?  ?

## 2022-03-31 DIAGNOSIS — M9903 Segmental and somatic dysfunction of lumbar region: Secondary | ICD-10-CM | POA: Diagnosis not present

## 2022-03-31 DIAGNOSIS — M5124 Other intervertebral disc displacement, thoracic region: Secondary | ICD-10-CM | POA: Diagnosis not present

## 2022-03-31 DIAGNOSIS — M5022 Other cervical disc displacement, mid-cervical region, unspecified level: Secondary | ICD-10-CM | POA: Diagnosis not present

## 2022-03-31 DIAGNOSIS — M5032 Other cervical disc degeneration, mid-cervical region, unspecified level: Secondary | ICD-10-CM | POA: Diagnosis not present

## 2022-03-31 DIAGNOSIS — M9902 Segmental and somatic dysfunction of thoracic region: Secondary | ICD-10-CM | POA: Diagnosis not present

## 2022-03-31 DIAGNOSIS — M9901 Segmental and somatic dysfunction of cervical region: Secondary | ICD-10-CM | POA: Diagnosis not present

## 2022-04-01 ENCOUNTER — Ambulatory Visit (INDEPENDENT_AMBULATORY_CARE_PROVIDER_SITE_OTHER): Payer: Medicare Other | Admitting: Podiatry

## 2022-04-01 DIAGNOSIS — M79675 Pain in left toe(s): Secondary | ICD-10-CM

## 2022-04-01 DIAGNOSIS — B351 Tinea unguium: Secondary | ICD-10-CM | POA: Diagnosis not present

## 2022-04-01 DIAGNOSIS — Z7901 Long term (current) use of anticoagulants: Secondary | ICD-10-CM | POA: Diagnosis not present

## 2022-04-01 DIAGNOSIS — M79674 Pain in right toe(s): Secondary | ICD-10-CM

## 2022-04-04 NOTE — Progress Notes (Signed)
?  Subjective:  ?Patient ID: [name], male    DOB: 02/06/1946,  MRN: [id number] ? ?Chief Complaint  ?Patient presents with  ? Nail Problem  ?  RFC Bilateral nail trim 1-5. No current questions or concerns.   ? ? ?76 y.o. male returns with the above complaint. History confirmed with patient.  He is on warfarin doing well the intermittent debridement has been very helpful for him for the toenails, the calluses are no longer painful with his new orthoses  ? ?Objective:  ?Physical Exam: ?warm, good capillary refill, no trophic changes or ulcerative lesions, and normal DP and PT pulses.  He has loss of protective sensation bilaterally. thickened elongated painful toenails with yellow discoloration and subungual debris bilaterally x10 ?Assessment:  ? ?1. Pain due to onychomycosis of toenails of both feet   ?2. Anticoagulated   ? ? ? ? ?Plan:  ?Patient was evaluated and treated and all questions answered. ? ? ?Discussed the etiology and treatment options for the condition in detail with the patient. Educated patient on the topical and oral treatment options for mycotic nails. Recommended debridement of the nails today. Sharp and mechanical debridement performed of all painful and mycotic nails today. Nails debrided in length and thickness using a nail nipper to level of comfort. Discussed treatment options including appropriate shoe gear. Follow up as scheduled for painful nails.   ? ? ? ?Return in about 11 weeks (around 06/17/2022) for at risk nail care.  ? ? ?

## 2022-04-14 DIAGNOSIS — M5124 Other intervertebral disc displacement, thoracic region: Secondary | ICD-10-CM | POA: Diagnosis not present

## 2022-04-14 DIAGNOSIS — M9902 Segmental and somatic dysfunction of thoracic region: Secondary | ICD-10-CM | POA: Diagnosis not present

## 2022-04-14 DIAGNOSIS — M9901 Segmental and somatic dysfunction of cervical region: Secondary | ICD-10-CM | POA: Diagnosis not present

## 2022-04-14 DIAGNOSIS — M5032 Other cervical disc degeneration, mid-cervical region, unspecified level: Secondary | ICD-10-CM | POA: Diagnosis not present

## 2022-04-14 DIAGNOSIS — M5022 Other cervical disc displacement, mid-cervical region, unspecified level: Secondary | ICD-10-CM | POA: Diagnosis not present

## 2022-04-14 DIAGNOSIS — M9903 Segmental and somatic dysfunction of lumbar region: Secondary | ICD-10-CM | POA: Diagnosis not present

## 2022-04-21 DIAGNOSIS — M9901 Segmental and somatic dysfunction of cervical region: Secondary | ICD-10-CM | POA: Diagnosis not present

## 2022-04-21 DIAGNOSIS — M9903 Segmental and somatic dysfunction of lumbar region: Secondary | ICD-10-CM | POA: Diagnosis not present

## 2022-04-21 DIAGNOSIS — M5022 Other cervical disc displacement, mid-cervical region, unspecified level: Secondary | ICD-10-CM | POA: Diagnosis not present

## 2022-04-21 DIAGNOSIS — M9902 Segmental and somatic dysfunction of thoracic region: Secondary | ICD-10-CM | POA: Diagnosis not present

## 2022-04-21 DIAGNOSIS — M5032 Other cervical disc degeneration, mid-cervical region, unspecified level: Secondary | ICD-10-CM | POA: Diagnosis not present

## 2022-04-21 DIAGNOSIS — M5124 Other intervertebral disc displacement, thoracic region: Secondary | ICD-10-CM | POA: Diagnosis not present

## 2022-04-28 DIAGNOSIS — M9901 Segmental and somatic dysfunction of cervical region: Secondary | ICD-10-CM | POA: Diagnosis not present

## 2022-04-28 DIAGNOSIS — M9902 Segmental and somatic dysfunction of thoracic region: Secondary | ICD-10-CM | POA: Diagnosis not present

## 2022-04-28 DIAGNOSIS — M5032 Other cervical disc degeneration, mid-cervical region, unspecified level: Secondary | ICD-10-CM | POA: Diagnosis not present

## 2022-04-28 DIAGNOSIS — M5124 Other intervertebral disc displacement, thoracic region: Secondary | ICD-10-CM | POA: Diagnosis not present

## 2022-04-28 DIAGNOSIS — M9903 Segmental and somatic dysfunction of lumbar region: Secondary | ICD-10-CM | POA: Diagnosis not present

## 2022-04-28 DIAGNOSIS — M5022 Other cervical disc displacement, mid-cervical region, unspecified level: Secondary | ICD-10-CM | POA: Diagnosis not present

## 2022-05-01 ENCOUNTER — Ambulatory Visit (INDEPENDENT_AMBULATORY_CARE_PROVIDER_SITE_OTHER): Payer: Medicare Other

## 2022-05-01 DIAGNOSIS — D6859 Other primary thrombophilia: Secondary | ICD-10-CM

## 2022-05-01 DIAGNOSIS — Z5181 Encounter for therapeutic drug level monitoring: Secondary | ICD-10-CM | POA: Diagnosis not present

## 2022-05-01 LAB — POCT INR: INR: 1.8 — AB (ref 2.0–3.0)

## 2022-05-01 NOTE — Patient Instructions (Signed)
Description   Take 1.5 tablets today and then resume same dosage 1 tablet daily except for 1/2 tablet on Mondays, Wednesdays and Fridays. Recheck INR in 4 weeks. Coumadin Clinic [phone]

## 2022-05-06 DIAGNOSIS — M5124 Other intervertebral disc displacement, thoracic region: Secondary | ICD-10-CM | POA: Diagnosis not present

## 2022-05-06 DIAGNOSIS — M9901 Segmental and somatic dysfunction of cervical region: Secondary | ICD-10-CM | POA: Diagnosis not present

## 2022-05-06 DIAGNOSIS — M5022 Other cervical disc displacement, mid-cervical region, unspecified level: Secondary | ICD-10-CM | POA: Diagnosis not present

## 2022-05-06 DIAGNOSIS — M5032 Other cervical disc degeneration, mid-cervical region, unspecified level: Secondary | ICD-10-CM | POA: Diagnosis not present

## 2022-05-06 DIAGNOSIS — M9902 Segmental and somatic dysfunction of thoracic region: Secondary | ICD-10-CM | POA: Diagnosis not present

## 2022-05-06 DIAGNOSIS — M9903 Segmental and somatic dysfunction of lumbar region: Secondary | ICD-10-CM | POA: Diagnosis not present

## 2022-05-20 DIAGNOSIS — M9903 Segmental and somatic dysfunction of lumbar region: Secondary | ICD-10-CM | POA: Diagnosis not present

## 2022-05-20 DIAGNOSIS — M5022 Other cervical disc displacement, mid-cervical region, unspecified level: Secondary | ICD-10-CM | POA: Diagnosis not present

## 2022-05-20 DIAGNOSIS — M9902 Segmental and somatic dysfunction of thoracic region: Secondary | ICD-10-CM | POA: Diagnosis not present

## 2022-05-20 DIAGNOSIS — M9901 Segmental and somatic dysfunction of cervical region: Secondary | ICD-10-CM | POA: Diagnosis not present

## 2022-05-20 DIAGNOSIS — M5124 Other intervertebral disc displacement, thoracic region: Secondary | ICD-10-CM | POA: Diagnosis not present

## 2022-05-20 DIAGNOSIS — M5032 Other cervical disc degeneration, mid-cervical region, unspecified level: Secondary | ICD-10-CM | POA: Diagnosis not present

## 2022-05-26 DIAGNOSIS — M9903 Segmental and somatic dysfunction of lumbar region: Secondary | ICD-10-CM | POA: Diagnosis not present

## 2022-05-26 DIAGNOSIS — M5022 Other cervical disc displacement, mid-cervical region, unspecified level: Secondary | ICD-10-CM | POA: Diagnosis not present

## 2022-05-26 DIAGNOSIS — M5124 Other intervertebral disc displacement, thoracic region: Secondary | ICD-10-CM | POA: Diagnosis not present

## 2022-05-26 DIAGNOSIS — M9901 Segmental and somatic dysfunction of cervical region: Secondary | ICD-10-CM | POA: Diagnosis not present

## 2022-05-26 DIAGNOSIS — M9902 Segmental and somatic dysfunction of thoracic region: Secondary | ICD-10-CM | POA: Diagnosis not present

## 2022-05-26 DIAGNOSIS — M5032 Other cervical disc degeneration, mid-cervical region, unspecified level: Secondary | ICD-10-CM | POA: Diagnosis not present

## 2022-05-29 ENCOUNTER — Ambulatory Visit (INDEPENDENT_AMBULATORY_CARE_PROVIDER_SITE_OTHER): Payer: Medicare Other | Admitting: *Deleted

## 2022-05-29 DIAGNOSIS — Z5181 Encounter for therapeutic drug level monitoring: Secondary | ICD-10-CM | POA: Diagnosis not present

## 2022-05-29 DIAGNOSIS — D6859 Other primary thrombophilia: Secondary | ICD-10-CM | POA: Diagnosis not present

## 2022-05-29 LAB — POCT INR: INR: 4.1 — AB (ref 2.0–3.0)

## 2022-05-29 NOTE — Patient Instructions (Addendum)
Description   Do not take any Warfarin today then continue taking warfarin 1 tablet daily except for 1/2 tablet on Mondays, Wednesdays and Fridays. Have some leafy veggies today and remain consistent. Recheck INR in 2 weeks. Coumadin Clinic [phone]

## 2022-06-02 DIAGNOSIS — M9901 Segmental and somatic dysfunction of cervical region: Secondary | ICD-10-CM | POA: Diagnosis not present

## 2022-06-02 DIAGNOSIS — M5032 Other cervical disc degeneration, mid-cervical region, unspecified level: Secondary | ICD-10-CM | POA: Diagnosis not present

## 2022-06-02 DIAGNOSIS — M9902 Segmental and somatic dysfunction of thoracic region: Secondary | ICD-10-CM | POA: Diagnosis not present

## 2022-06-02 DIAGNOSIS — M5022 Other cervical disc displacement, mid-cervical region, unspecified level: Secondary | ICD-10-CM | POA: Diagnosis not present

## 2022-06-02 DIAGNOSIS — M9903 Segmental and somatic dysfunction of lumbar region: Secondary | ICD-10-CM | POA: Diagnosis not present

## 2022-06-02 DIAGNOSIS — M5124 Other intervertebral disc displacement, thoracic region: Secondary | ICD-10-CM | POA: Diagnosis not present

## 2022-06-12 ENCOUNTER — Ambulatory Visit (INDEPENDENT_AMBULATORY_CARE_PROVIDER_SITE_OTHER): Payer: Medicare Other

## 2022-06-12 DIAGNOSIS — Z5181 Encounter for therapeutic drug level monitoring: Secondary | ICD-10-CM | POA: Diagnosis not present

## 2022-06-12 DIAGNOSIS — D6859 Other primary thrombophilia: Secondary | ICD-10-CM

## 2022-06-12 LAB — POCT INR: INR: 2.9 (ref 2.0–3.0)

## 2022-06-12 NOTE — Patient Instructions (Signed)
continue taking warfarin 1 tablet daily except for 1/2 tablet on Mondays, Wednesdays and Fridays.  Recheck INR in 4 weeks. [facility]madin Clinic [phone]

## 2022-06-16 DIAGNOSIS — M9903 Segmental and somatic dysfunction of lumbar region: Secondary | ICD-10-CM | POA: Diagnosis not present

## 2022-06-16 DIAGNOSIS — M5032 Other cervical disc degeneration, mid-cervical region, unspecified level: Secondary | ICD-10-CM | POA: Diagnosis not present

## 2022-06-16 DIAGNOSIS — M5022 Other cervical disc displacement, mid-cervical region, unspecified level: Secondary | ICD-10-CM | POA: Diagnosis not present

## 2022-06-16 DIAGNOSIS — M9901 Segmental and somatic dysfunction of cervical region: Secondary | ICD-10-CM | POA: Diagnosis not present

## 2022-06-16 DIAGNOSIS — M5124 Other intervertebral disc displacement, thoracic region: Secondary | ICD-10-CM | POA: Diagnosis not present

## 2022-06-16 DIAGNOSIS — M9902 Segmental and somatic dysfunction of thoracic region: Secondary | ICD-10-CM | POA: Diagnosis not present

## 2022-06-17 ENCOUNTER — Ambulatory Visit (INDEPENDENT_AMBULATORY_CARE_PROVIDER_SITE_OTHER): Payer: Medicare Other | Admitting: Podiatry

## 2022-06-17 DIAGNOSIS — B351 Tinea unguium: Secondary | ICD-10-CM | POA: Diagnosis not present

## 2022-06-17 DIAGNOSIS — M79674 Pain in right toe(s): Secondary | ICD-10-CM | POA: Diagnosis not present

## 2022-06-17 DIAGNOSIS — Z7901 Long term (current) use of anticoagulants: Secondary | ICD-10-CM | POA: Diagnosis not present

## 2022-06-17 DIAGNOSIS — M79675 Pain in left toe(s): Secondary | ICD-10-CM | POA: Diagnosis not present

## 2022-06-17 NOTE — Progress Notes (Signed)
  Subjective:  Patient ID: [name], male    DOB: 1946/03/20,  MRN: [id number]  Chief Complaint  Patient presents with   Nail Problem    Nail trim     76 y.o. male returns with the above complaint. History confirmed with patient.  He is on warfarin doing well the intermittent debridement has been very helpful for him for the toenails, he does note some rubbing on the toes  Objective:  Physical Exam: warm, good capillary refill, no trophic changes or ulcerative lesions, and normal DP and PT pulses.  He has loss of protective sensation bilaterally. thickened elongated painful toenails with yellow discoloration and subungual debris bilaterally x10.  +1 pitting edema today both lower extremities Assessment:   1. Pain due to onychomycosis of toenails of both feet   2. Anticoagulated       Plan:  Patient was evaluated and treated and all questions answered.   Discussed the etiology and treatment options for the condition in detail with the patient. Educated patient on the topical and oral treatment options for mycotic nails. Recommended debridement of the nails today. Sharp and mechanical debridement performed of all painful and mycotic nails today. Nails debrided in length and thickness using a nail nipper to level of comfort. Discussed treatment options including appropriate shoe gear. Follow up as scheduled for painful nails.    Continue elevating the feet for the edema I think this is contributing to the toes rubbing on his shoe gear.  Discussed the fit of comfortable shoe gear.  He has new slippers that will need to be stretched, he will contact the shoe market to see if they have a [place] that can assist with this  Return in about 11 weeks (around 09/02/2022) for at risk foot care .

## 2022-06-23 DIAGNOSIS — M5022 Other cervical disc displacement, mid-cervical region, unspecified level: Secondary | ICD-10-CM | POA: Diagnosis not present

## 2022-06-23 DIAGNOSIS — M5124 Other intervertebral disc displacement, thoracic region: Secondary | ICD-10-CM | POA: Diagnosis not present

## 2022-06-23 DIAGNOSIS — M9902 Segmental and somatic dysfunction of thoracic region: Secondary | ICD-10-CM | POA: Diagnosis not present

## 2022-06-23 DIAGNOSIS — M9901 Segmental and somatic dysfunction of cervical region: Secondary | ICD-10-CM | POA: Diagnosis not present

## 2022-06-23 DIAGNOSIS — M5032 Other cervical disc degeneration, mid-cervical region, unspecified level: Secondary | ICD-10-CM | POA: Diagnosis not present

## 2022-06-23 DIAGNOSIS — M9903 Segmental and somatic dysfunction of lumbar region: Secondary | ICD-10-CM | POA: Diagnosis not present

## 2022-06-30 DIAGNOSIS — M5022 Other cervical disc displacement, mid-cervical region, unspecified level: Secondary | ICD-10-CM | POA: Diagnosis not present

## 2022-06-30 DIAGNOSIS — M9901 Segmental and somatic dysfunction of cervical region: Secondary | ICD-10-CM | POA: Diagnosis not present

## 2022-06-30 DIAGNOSIS — M5124 Other intervertebral disc displacement, thoracic region: Secondary | ICD-10-CM | POA: Diagnosis not present

## 2022-06-30 DIAGNOSIS — M5032 Other cervical disc degeneration, mid-cervical region, unspecified level: Secondary | ICD-10-CM | POA: Diagnosis not present

## 2022-06-30 DIAGNOSIS — M9902 Segmental and somatic dysfunction of thoracic region: Secondary | ICD-10-CM | POA: Diagnosis not present

## 2022-06-30 DIAGNOSIS — M9903 Segmental and somatic dysfunction of lumbar region: Secondary | ICD-10-CM | POA: Diagnosis not present

## 2022-07-01 ENCOUNTER — Encounter: Payer: Self-pay | Admitting: Family Medicine

## 2022-07-01 DIAGNOSIS — M159 Polyosteoarthritis, unspecified: Secondary | ICD-10-CM

## 2022-07-02 MED ORDER — TRAMADOL HCL 50 MG PO TABS: 50.0000 mg | ORAL_TABLET | Freq: Every day | ORAL | 1 refills | Status: DC | PRN

## 2022-07-07 DIAGNOSIS — M9903 Segmental and somatic dysfunction of lumbar region: Secondary | ICD-10-CM | POA: Diagnosis not present

## 2022-07-07 DIAGNOSIS — M9901 Segmental and somatic dysfunction of cervical region: Secondary | ICD-10-CM | POA: Diagnosis not present

## 2022-07-07 DIAGNOSIS — M5124 Other intervertebral disc displacement, thoracic region: Secondary | ICD-10-CM | POA: Diagnosis not present

## 2022-07-07 DIAGNOSIS — M5022 Other cervical disc displacement, mid-cervical region, unspecified level: Secondary | ICD-10-CM | POA: Diagnosis not present

## 2022-07-07 DIAGNOSIS — M5032 Other cervical disc degeneration, mid-cervical region, unspecified level: Secondary | ICD-10-CM | POA: Diagnosis not present

## 2022-07-07 DIAGNOSIS — M9902 Segmental and somatic dysfunction of thoracic region: Secondary | ICD-10-CM | POA: Diagnosis not present

## 2022-07-10 ENCOUNTER — Ambulatory Visit (INDEPENDENT_AMBULATORY_CARE_PROVIDER_SITE_OTHER): Payer: Medicare Other

## 2022-07-10 DIAGNOSIS — Z5181 Encounter for therapeutic drug level monitoring: Secondary | ICD-10-CM

## 2022-07-10 DIAGNOSIS — D6859 Other primary thrombophilia: Secondary | ICD-10-CM

## 2022-07-10 LAB — POCT INR: INR: 3.1 — AB (ref 2.0–3.0)

## 2022-07-10 NOTE — Patient Instructions (Signed)
Description   Eat greens today and continue taking warfarin 1 tablet daily except for 1/2 tablet on Mondays, Wednesdays and Fridays. Recheck INR in 4 weeks. Coumadin Clinic [phone]

## 2022-07-14 DIAGNOSIS — M9901 Segmental and somatic dysfunction of cervical region: Secondary | ICD-10-CM | POA: Diagnosis not present

## 2022-07-14 DIAGNOSIS — M5022 Other cervical disc displacement, mid-cervical region, unspecified level: Secondary | ICD-10-CM | POA: Diagnosis not present

## 2022-07-14 DIAGNOSIS — M9903 Segmental and somatic dysfunction of lumbar region: Secondary | ICD-10-CM | POA: Diagnosis not present

## 2022-07-14 DIAGNOSIS — M5032 Other cervical disc degeneration, mid-cervical region, unspecified level: Secondary | ICD-10-CM | POA: Diagnosis not present

## 2022-07-14 DIAGNOSIS — M5124 Other intervertebral disc displacement, thoracic region: Secondary | ICD-10-CM | POA: Diagnosis not present

## 2022-07-14 DIAGNOSIS — M9902 Segmental and somatic dysfunction of thoracic region: Secondary | ICD-10-CM | POA: Diagnosis not present

## 2022-07-16 ENCOUNTER — Ambulatory Visit (INDEPENDENT_AMBULATORY_CARE_PROVIDER_SITE_OTHER): Payer: Medicare Other | Admitting: Physician Assistant

## 2022-07-16 ENCOUNTER — Encounter: Payer: Self-pay | Admitting: Physician Assistant

## 2022-07-16 VITALS — BP 147/71 | HR 74 | Resp 18 | Ht 73.0 in | Wt 194.0 lb

## 2022-07-16 DIAGNOSIS — I25118 Atherosclerotic heart disease of native coronary artery with other forms of angina pectoris: Secondary | ICD-10-CM

## 2022-07-16 DIAGNOSIS — F0280 Dementia in other diseases classified elsewhere without behavioral disturbance: Secondary | ICD-10-CM | POA: Diagnosis not present

## 2022-07-16 DIAGNOSIS — G309 Alzheimer's disease, unspecified: Secondary | ICD-10-CM

## 2022-07-16 MED ORDER — MEMANTINE HCL 5 MG PO TABS: ORAL_TABLET | ORAL | 11 refills | Status: DC

## 2022-07-16 NOTE — Patient Instructions (Signed)
It was a pleasure to see you today at our office.   Recommendations:  Follow up in  6 months Continue donepezil 10 mg daily. Side effects were discussed  Start Memantine 5 mg tablets.  Take 1 tablet at bedtime for 2 weeks, then 1 tablet twice daily.   Side effects include dizziness, headache, diarrhea or constipation.  Call if severe symptoms appear   Whom to call:  Memory  decline, memory medications: Call our office [phone]   For psychiatric meds, mood meds: Please have your primary care physician manage these medications.   Counseling regarding caregiver distress, including caregiver depression, anxiety and issues regarding community resources, adult day care programs, adult living facilities, or memory care questions:   Feel free to contact [place], Social Worker at [phone]   For assessment of decision of mental capacity and competency:  Call Dr. Anthoney Harada, geriatric psychiatrist at [phone]  For guidance in geriatric dementia issues please call [facility] [phone]  For guidance regarding [facility] and if placement were needed at the facility, contact Arnell Asal, Social Worker tel: [phone]  If you have any severe symptoms of a stroke, or other severe issues such as confusion,severe chills or fever, etc call 911 or go to the ER as you may need to be evaluated further   Feel free to visit Facebook page " [employer]" for tips of how to care for people with memory problems.   Feel free to go to the following database for funded clinical studies conducted around the world: http://saunders.com/   https://www.triadclinicaltrials.com/     RECOMMENDATIONS FOR ALL PATIENTS WITH MEMORY PROBLEMS: 1. Continue to exercise (Recommend 30 minutes of walking everyday, or 3 hours every week) 2. Increase social interactions - continue going to [place] and enjoy social gatherings with friends and family 3. Eat  healthy, avoid fried foods and eat more fruits and vegetables 4. Maintain adequate blood pressure, blood sugar, and blood cholesterol level. Reducing the risk of stroke and cardiovascular disease also helps promoting better memory. 5. Avoid stressful situations. Live a simple life and avoid aggravations. Organize your time and prepare for the next day in anticipation. 6. Sleep well, avoid any interruptions of sleep and avoid any distractions in the bedroom that may interfere with adequate sleep quality 7. Avoid sugar, avoid sweets as there is a strong link between excessive sugar intake, diabetes, and cognitive impairment We discussed the Mediterranean diet, which has been shown to help patients reduce the risk of progressive memory disorders and reduces cardiovascular risk. This includes eating fish, eat fruits and green leafy vegetables, nuts like almonds and hazelnuts, walnuts, and also use olive oil. Avoid fast foods and fried foods as much as possible. Avoid sweets and sugar as sugar use has been linked to worsening of memory function.  There is always a concern of gradual progression of memory problems. If this is the case, then we may need to adjust level of care according to patient needs. Support, both to the patient and caregiver, should then be put into place.    The Alzheimer's Association is here all day, every day for people facing Alzheimer's disease through our free 24/7 Helpline: [phone]. The Helpline provides reliable information and support to all those who need assistance, such as individuals living with memory loss, Alzheimer's or other dementia, caregivers, health care professionals and the public.  Our highly trained and knowledgeable staff can help you with: Understanding memory loss, dementia and Alzheimer's  Medications  and other treatment options  General information about aging and brain health  Skills to provide quality care and to find the best care from  professionals  Legal, financial and living-arrangement decisions Our Helpline also features: Confidential care consultation provided by master's level clinicians who can help with decision-making support, crisis assistance and education on issues families face every day  Help in a caller's preferred language using our translation service that features more than 200 languages and dialects  Referrals to local community programs, services and ongoing support     FALL PRECAUTIONS: Be cautious when walking. Scan the area for obstacles that may increase the risk of trips and falls. When getting up in the mornings, sit up at the edge of the bed for a few minutes before getting out of bed. Consider elevating the bed at the head end to avoid drop of blood pressure when getting up. Walk always in a well-lit room (use night lights in the walls). Avoid area rugs or power cords from appliances in the middle of the walkways. Use a walker or a cane if necessary and consider physical therapy for balance exercise. Get your eyesight checked regularly.  FINANCIAL OVERSIGHT: Supervision, especially oversight when making financial decisions or transactions is also recommended.  HOME SAFETY: Consider the safety of the kitchen when operating appliances like stoves, microwave oven, and blender. Consider having supervision and share cooking responsibilities until no longer able to participate in those. Accidents with firearms and other hazards in the house should be identified and addressed as well.   ABILITY TO BE LEFT ALONE: If patient is unable to contact 911 operator, consider using LifeLine, or when the need is there, arrange for someone to stay with patients. Smoking is a fire hazard, consider supervision or cessation. Risk of wandering should be assessed by caregiver and if detected at any point, supervision and safe proof recommendations should be instituted.  MEDICATION SUPERVISION: Inability to self-administer  medication needs to be constantly addressed. Implement a mechanism to ensure safe administration of the medications.   DRIVING: Regarding driving, in patients with progressive memory problems, driving will be impaired. We advise to have someone else do the driving if trouble finding directions or if minor accidents are reported. Independent driving assessment is available to determine safety of driving.   If you are interested in the driving assessment, you can contact the following:  [employer] in [place]  [place] [phone] or [phone]      [place] refers to food and lifestyle choices that are based on the traditions of countries located on the [employer]. This way of eating has been shown to help prevent certain conditions and improve outcomes for people who have chronic diseases, like kidney disease and heart disease. What are tips for following this plan? Lifestyle  Cook and eat meals together with your family, when possible. Drink enough fluid to keep your urine clear or pale yellow. Be physically active every day. This includes: Aerobic exercise like running or swimming. Leisure activities like gardening, walking, or housework. Get 7-8 hours of sleep each night. If recommended by your health care provider, drink red wine in moderation. This means 1 glass a day for nonpregnant women and 2 glasses a day for men. A glass of wine equals 5 oz (150 mL). Reading food labels  Check the serving size of packaged foods. For foods such as rice and pasta,  the serving size refers to the amount of cooked product, not dry. Check the total fat in packaged foods. Avoid foods that have saturated fat or trans fats. Check the ingredients list for added sugars, such as corn syrup. Shopping  At the grocery store, buy most of your food  from the areas near the walls of the store. This includes: Fresh fruits and vegetables (produce). Grains, beans, nuts, and seeds. Some of these may be available in unpackaged forms or large amounts (in bulk). Fresh seafood. Poultry and eggs. Low-fat dairy products. Buy whole ingredients instead of prepackaged foods. Buy fresh fruits and vegetables in-season from local farmers markets. Buy frozen fruits and vegetables in resealable bags. If you do not have access to quality fresh seafood, buy precooked frozen shrimp or canned fish, such as tuna, salmon, or sardines. Buy small amounts of raw or cooked vegetables, salads, or olives from the deli or salad bar at your store. Stock your pantry so you always have certain foods on hand, such as olive oil, canned tuna, canned tomatoes, rice, pasta, and beans. Cooking  Cook foods with extra-virgin olive oil instead of using butter or other vegetable oils. Have meat as a side dish, and have vegetables or grains as your main dish. This means having meat in small portions or adding small amounts of meat to foods like pasta or stew. Use beans or vegetables instead of meat in common dishes like chili or lasagna. Experiment with different cooking methods. Try roasting or broiling vegetables instead of steaming or sauteing them. Add frozen vegetables to soups, stews, pasta, or rice. Add nuts or seeds for added healthy fat at each meal. You can add these to yogurt, salads, or vegetable dishes. Marinate fish or vegetables using olive oil, lemon juice, garlic, and fresh herbs. Meal planning  Plan to eat 1 vegetarian meal one day each week. Try to work up to 2 vegetarian meals, if possible. Eat seafood 2 or more times a week. Have healthy snacks readily available, such as: Vegetable sticks with hummus. Greek yogurt. Fruit and nut trail mix. Eat balanced meals throughout the week. This includes: Fruit: 2-3 servings a day Vegetables: 4-5 servings a  day Low-fat dairy: 2 servings a day Fish, poultry, or lean meat: 1 serving a day Beans and legumes: 2 or more servings a week Nuts and seeds: 1-2 servings a day Whole grains: 6-8 servings a day Extra-virgin olive oil: 3-4 servings a day Limit red meat and sweets to only a few servings a month What are my food choices? Mediterranean diet Recommended Grains: Whole-grain pasta. Brown rice. Bulgar wheat. Polenta. Couscous. Whole-wheat bread. [place]. Vegetables: Artichokes. Beets. Broccoli. Cabbage. Carrots. Eggplant. Green beans. Chard. Kale. Spinach. Onions. Leeks. Peas. Squash. Tomatoes. Peppers. Radishes. Fruits: Apples. Apricots. Avocado. Berries. Bananas. Cherries. Dates. Figs. Grapes. Lemons. Melon. Oranges. Peaches. Plums. Pomegranate. Meats and other protein foods: Beans. Almonds. Sunflower seeds. Pine nuts. Peanuts. [place]. Salmon. Scallops. Shrimp. [place]. Tilapia. Clams. Oysters. Eggs. Dairy: Low-fat milk. Cheese. Greek yogurt. Beverages: Water. Red wine. Herbal tea. Fats and oils: Extra virgin olive oil. Avocado oil. Grape seed oil. Sweets and desserts: [place] yogurt with honey. Baked apples. Poached pears. Trail mix. Seasoning and other foods: Basil. Cilantro. Coriander. Cumin. Mint. Parsley. Sage. Rosemary. Tarragon. Garlic. Oregano. Thyme. Pepper. Balsalmic vinegar. Tahini. Hummus. Tomato sauce. Olives. Mushrooms. Limit these Grains: Prepackaged pasta or rice dishes. Prepackaged cereal with added sugar. Vegetables: Deep fried potatoes (french fries). Fruits: Fruit canned in syrup. Meats and other protein foods:  Beef. Pork. Lamb. Poultry with skin. Hot dogs. [place] Salines. Dairy: Ice cream. Sour cream. Whole milk. Beverages: Juice. Sugar-sweetened soft drinks. Beer. Liquor and spirits. Fats and oils: Butter. Canola oil. Vegetable oil. Beef fat (tallow). Lard. Sweets and desserts: Cookies. Cakes. Pies. Candy. Seasoning and other foods: Mayonnaise. Premade sauces and marinades. The  items listed may not be a complete list. Talk with your dietitian about what dietary choices are right for you. Summary The Mediterranean diet includes both food and lifestyle choices. Eat a variety of fresh fruits and vegetables, beans, nuts, seeds, and whole grains. Limit the amount of red meat and sweets that you eat. Talk with your health care provider about whether it is safe for you to drink red wine in moderation. This means 1 glass a day for nonpregnant women and 2 glasses a day for men. A glass of wine equals 5 oz (150 mL). This information is not intended to replace advice given to you by your health care provider. Make sure you discuss any questions you have with your health care provider. Document Released: 07/17/2016 Document Revised: 08/19/2016 Document Reviewed: 07/17/2016 Elsevier Interactive Patient Education  2017 [employer].

## 2022-07-16 NOTE — Progress Notes (Signed)
Assessment/Plan:   Mild Cognitive Impairment due to Alzheimer's disease  [name] is a very pleasant 76 y.o. 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   Recommendations:    Continue donepezil 10 mg daily Side effects were discussed Close monitoring of cardiovascular risk factors Follow up in 6  months.   Case discussed with Dr. Delice Lesch who agrees with the plan     Subjective:   This patient is accompanied in the office by his wife who supplements the history.  Previous records as well as any outside records available were reviewed prior to todays visit.    Patient is currently on    Any changes in memory since last visit? Why I'm standing in front of the table . But he names the music in the bckground and can m  Patient lives with:  repeats oneself?  Endorsed, especially when routine changes  Disoriented when walking into a room?  Patient denies   Leaving objects in unusual places?  Patient denies   Ambulates  with difficulty?   Patient denies   Recent falls?  Patient denies   Any head injuries?  Patient denies   History of seizures?   Patient denies   Wandering behavior?  Patient denies   Patient drives?   Patient no longer drives  Any mood changes? More frustrated with the memory and with listening and watching TV or music and can't recall the singers or how to look up.  Patient denies   Any worsening depression?:  Patient denies   Hallucinations?  Patient denies   Paranoia?  Patient denies   Patient reports that male  sleeps well without vivid dreams, REM behavior or sleepwalking   History of sleep apnea?  Patient denies   Any hygiene concerns?  Patient denies   Independent of bathing and dressing?  Endorsed  Does the patient needs help with medications?  Denies Who is in charge of the finances?   is in charge    Any  changes in appetite?  Patient denies   Patient have trouble swallowing? Patient denies   Does the patient cook?  Patient denies   Any kitchen accidents such as leaving the stove on? Patient denies   Any headaches?  Patient denies   Double vision? Patient denies   Any focal numbness or tingling?  Patient denies   Chronic back pain Patient denies   Unilateral weakness?  Patient denies   Any tremors?  Patient denies   Any history of anosmia?  Patient denies   Any incontinence of urine?  Patient denies   Any bowel dysfunction?   Patient denies       HISTORY 12/06/2020: His wife has noted memory problems since 2020 that has gradually progressed.  He started having trouble calculating the checkbook and would be off by several hundreds of dollars.  He also quickly forgets conversations and would repeat questions after just minutes.  He hasn't driven for years but as a passenger he denies disorientation on familiar routes. On a recent cruise, he had trouble keeping up with the schedule and activities.  He has a system to remember to take his medications independently.  No difficulty with performing home chores or activities.  At first, it was thought to be related to his chronic back/radicular pain but now the memory problems appear to be out of proportion to being in pain.  He has family history of Alzheimer's disease on his mother's side, including his mother, aunts, uncles and grandmother.  Past Medical History:  Diagnosis Date   Allergy    Arthritis    CAD (coronary artery disease) 1996   POST CABG   Cataracts, bilateral    Chicken pox    Dyslipidemia    Hyperlipidemia    Hypertension    Sinus bradycardia      Past Surgical History:  Procedure Laterality Date   BYPASS GRAFT  1996   CATARACT EXTRACTION Right    INGUINAL HERNIA REPAIR Right 04/09/2020   Procedure: LAPAROSCOPIC RIGHT INGUINAL HERNIA REPAIR WITH MESH;  Surgeon: Ralene Ok, MD;  Location: [facility];  Service: General;   Laterality: Right;   REFRACTIVE SURGERY Right    SKIN BIOPSY     head x 2   UMBILICAL HERNIA REPAIR  2003     PREVIOUS MEDICATIONS:   CURRENT MEDICATIONS:  Outpatient Encounter Medications as of 07/16/2022  Medication Sig   acetaminophen (TYLENOL) 500 MG tablet Take 1,000 mg by mouth every 6 (six) hours as needed for moderate pain or headache. (Patient not taking: Reported on 01/24/2022)   amLODipine (NORVASC) 5 MG tablet Take 1.5 tablets (7.5 mg total) by mouth daily.   cholecalciferol (VITAMIN D) 1000 units tablet Take 1,000 Units by mouth daily.    ciclopirox (PENLAC) 8 % solution Apply topically at bedtime. Apply over nail and surrounding skin. Apply daily over previous coat. After seven (7) days, may remove with alcohol and continue cycle.   Cyanocobalamin (VITAMIN B12) 500 MCG TABS Take 500 mcg by mouth daily.    divalproex (DEPAKOTE) 125 MG DR tablet Take 1 tab at night for 1 week, then increase to 2 tablets at night (Patient not taking: Reported on 01/24/2022)   donepezil (ARICEPT) 10 MG tablet TAKE 1 TABLET AT BEDTIME   ezetimibe (ZETIA) 10 MG tablet Take 1 tablet (10 mg total) by mouth at bedtime.   fenofibrate (TRICOR) 145 MG tablet Take 1 tablet (145 mg total) by mouth at bedtime.   furosemide (LASIX) 20 MG tablet Take 1 tablet (20 mg total) by mouth daily. May take an extra dose '20mg'$  twice a week for swelling   losartan (COZAAR) 100 MG tablet Take 1 tablet (100 mg total) by mouth daily.   nitroGLYCERIN (NITROSTAT) 0.4 MG SL tablet Place 1 tablet (0.4 mg total) under the tongue every 5 (five) minutes as needed for chest pain.   Olopatadine HCl (PATADAY OP) Place 1 drop into both eyes daily as needed (allergies).   rosuvastatin (CRESTOR) 40 MG tablet Take 1 tablet (40 mg total) by mouth daily.   traMADol (ULTRAM) 50 MG tablet Take 1 tablet (50 mg total) by mouth daily as needed.   warfarin (COUMADIN) 5 MG tablet TAKE AS DIRECTED BY COUMADIN CLINIC   No facility-administered  encounter medications on file as of 07/16/2022.     Objective:     PHYSICAL EXAMINATION:    VITALS:  There were no vitals filed for this visit.  GEN:  The patient appears stated age and is in NAD. HEENT:  Normocephalic, atraumatic.   Neurological examination:  General: NAD, well-groomed, appears stated age. Orientation: The patient is alert. Oriented to person, place and date Cranial nerves: There is good facial symmetry.The speech is fluent and clear. No aphasia or dysarthria. Fund of knowledge is appropriate. Recent memory impaired and remote memory is normal.  Attention and concentration are normal.  Able to name objects  and repeat phrases.  Hearing is intact to conversational tone.    Sensation: Sensation is intact to light touch throughout Motor: Strength is at least antigravity x4. Tremors: none  DTR's 2/4 in UE/LE      10/11/2021    1:00 PM  Montreal Cognitive Assessment   Visuospatial/ Executive (0/5) 0  Naming (0/3) 2  Attention: Read list of digits (0/2) 2  Attention: Read list of letters (0/1) 1  Attention: Serial 7 subtraction starting at 100 (0/3) 2  Language: Repeat phrase (0/2) 0  Language : Fluency (0/1) 1  Abstraction (0/2) 2  Delayed Recall (0/5) 2  Orientation (0/6) 5  Total 17  Adjusted Score (based on education) 17        No data to display             Movement examination: Tone: There is normal tone in the UE/LE Abnormal movements:  no tremor.  No myoclonus.  No asterixis.   Coordination:  There is no decremation with RAM's. Normal finger to nose  Gait and Station: The patient has no difficulty arising out of a deep-seated chair without the use of the hands. The patient's stride length is good.  Gait is cautious and narrow.   Thank you for allowing [place] the opportunity to participate in the care of this nice patient. Please do not hesitate to contact us for any questions or concerns.   Total time spent on today's visit was *** minutes  dedicated to this patient today, preparing to see patient, examining the patient, ordering tests and/or medications and counseling the patient, documenting clinical information in the EHR or other health record, independently interpreting results and communicating results to the patient/family, discussing treatment and goals, answering patient's questions and coordinating care.  Cc:  [place], Betty G, MD  Sharene Butters 07/16/2022 7:13 AM

## 2022-07-28 DIAGNOSIS — M9901 Segmental and somatic dysfunction of cervical region: Secondary | ICD-10-CM | POA: Diagnosis not present

## 2022-07-28 DIAGNOSIS — M9902 Segmental and somatic dysfunction of thoracic region: Secondary | ICD-10-CM | POA: Diagnosis not present

## 2022-07-28 DIAGNOSIS — M5124 Other intervertebral disc displacement, thoracic region: Secondary | ICD-10-CM | POA: Diagnosis not present

## 2022-07-28 DIAGNOSIS — M9903 Segmental and somatic dysfunction of lumbar region: Secondary | ICD-10-CM | POA: Diagnosis not present

## 2022-07-28 DIAGNOSIS — M5022 Other cervical disc displacement, mid-cervical region, unspecified level: Secondary | ICD-10-CM | POA: Diagnosis not present

## 2022-07-28 DIAGNOSIS — M5032 Other cervical disc degeneration, mid-cervical region, unspecified level: Secondary | ICD-10-CM | POA: Diagnosis not present

## 2022-08-04 DIAGNOSIS — M9903 Segmental and somatic dysfunction of lumbar region: Secondary | ICD-10-CM | POA: Diagnosis not present

## 2022-08-04 DIAGNOSIS — M5032 Other cervical disc degeneration, mid-cervical region, unspecified level: Secondary | ICD-10-CM | POA: Diagnosis not present

## 2022-08-04 DIAGNOSIS — M5124 Other intervertebral disc displacement, thoracic region: Secondary | ICD-10-CM | POA: Diagnosis not present

## 2022-08-04 DIAGNOSIS — M9901 Segmental and somatic dysfunction of cervical region: Secondary | ICD-10-CM | POA: Diagnosis not present

## 2022-08-04 DIAGNOSIS — M5022 Other cervical disc displacement, mid-cervical region, unspecified level: Secondary | ICD-10-CM | POA: Diagnosis not present

## 2022-08-04 DIAGNOSIS — M9902 Segmental and somatic dysfunction of thoracic region: Secondary | ICD-10-CM | POA: Diagnosis not present

## 2022-08-07 ENCOUNTER — Ambulatory Visit: Payer: Medicare Other | Attending: Interventional Cardiology | Admitting: *Deleted

## 2022-08-07 DIAGNOSIS — D6859 Other primary thrombophilia: Secondary | ICD-10-CM | POA: Diagnosis not present

## 2022-08-07 DIAGNOSIS — Z5181 Encounter for therapeutic drug level monitoring: Secondary | ICD-10-CM | POA: Diagnosis not present

## 2022-08-07 LAB — POCT INR: INR: 2.4 (ref 2.0–3.0)

## 2022-08-07 NOTE — Patient Instructions (Signed)
Description   Continue taking warfarin 1 tablet daily except for 1/2 tablet on Mondays, Wednesdays and Fridays. Recheck INR in 4 weeks. [facility] [phone]

## 2022-08-08 ENCOUNTER — Other Ambulatory Visit: Payer: Self-pay | Admitting: Interventional Cardiology

## 2022-08-13 DIAGNOSIS — M5124 Other intervertebral disc displacement, thoracic region: Secondary | ICD-10-CM | POA: Diagnosis not present

## 2022-08-13 DIAGNOSIS — M5032 Other cervical disc degeneration, mid-cervical region, unspecified level: Secondary | ICD-10-CM | POA: Diagnosis not present

## 2022-08-13 DIAGNOSIS — M5022 Other cervical disc displacement, mid-cervical region, unspecified level: Secondary | ICD-10-CM | POA: Diagnosis not present

## 2022-08-13 DIAGNOSIS — M9901 Segmental and somatic dysfunction of cervical region: Secondary | ICD-10-CM | POA: Diagnosis not present

## 2022-08-13 DIAGNOSIS — M9903 Segmental and somatic dysfunction of lumbar region: Secondary | ICD-10-CM | POA: Diagnosis not present

## 2022-08-13 DIAGNOSIS — M9902 Segmental and somatic dysfunction of thoracic region: Secondary | ICD-10-CM | POA: Diagnosis not present

## 2022-08-15 ENCOUNTER — Encounter: Payer: Self-pay | Admitting: Internal Medicine

## 2022-08-19 ENCOUNTER — Ambulatory Visit (INDEPENDENT_AMBULATORY_CARE_PROVIDER_SITE_OTHER): Payer: Self-pay | Admitting: Podiatry

## 2022-08-19 DIAGNOSIS — M9902 Segmental and somatic dysfunction of thoracic region: Secondary | ICD-10-CM | POA: Diagnosis not present

## 2022-08-19 DIAGNOSIS — M5022 Other cervical disc displacement, mid-cervical region, unspecified level: Secondary | ICD-10-CM | POA: Diagnosis not present

## 2022-08-19 DIAGNOSIS — M5032 Other cervical disc degeneration, mid-cervical region, unspecified level: Secondary | ICD-10-CM | POA: Diagnosis not present

## 2022-08-19 DIAGNOSIS — Z91199 Patient's noncompliance with other medical treatment and regimen due to unspecified reason: Secondary | ICD-10-CM

## 2022-08-19 DIAGNOSIS — M5124 Other intervertebral disc displacement, thoracic region: Secondary | ICD-10-CM | POA: Diagnosis not present

## 2022-08-19 DIAGNOSIS — M9903 Segmental and somatic dysfunction of lumbar region: Secondary | ICD-10-CM | POA: Diagnosis not present

## 2022-08-19 DIAGNOSIS — M9901 Segmental and somatic dysfunction of cervical region: Secondary | ICD-10-CM | POA: Diagnosis not present

## 2022-08-20 NOTE — Progress Notes (Signed)
Patient was no-show for appointment today 

## 2022-08-22 ENCOUNTER — Other Ambulatory Visit: Payer: Self-pay | Admitting: Family Medicine

## 2022-08-22 DIAGNOSIS — M159 Polyosteoarthritis, unspecified: Secondary | ICD-10-CM

## 2022-08-22 NOTE — Telephone Encounter (Signed)
Last OV - 10/2021 Last filled - 07/31/22

## 2022-08-26 ENCOUNTER — Ambulatory Visit: Payer: Medicare Other | Admitting: Podiatry

## 2022-08-28 ENCOUNTER — Other Ambulatory Visit: Payer: Self-pay

## 2022-08-28 ENCOUNTER — Telehealth: Payer: Self-pay | Admitting: Physician Assistant

## 2022-08-28 ENCOUNTER — Emergency Department (HOSPITAL_COMMUNITY)
Admission: EM | Admit: 2022-08-28 | Discharge: 2022-08-28 | Disposition: A | Discharge status: Home / Self Care | Payer: Medicare Other | Attending: Emergency Medicine | Admitting: Emergency Medicine

## 2022-08-28 ENCOUNTER — Telehealth: Payer: Self-pay | Admitting: Family Medicine

## 2022-08-28 ENCOUNTER — Encounter (HOSPITAL_COMMUNITY): Payer: Self-pay | Admitting: Oncology

## 2022-08-28 DIAGNOSIS — Z7901 Long term (current) use of anticoagulants: Secondary | ICD-10-CM | POA: Insufficient documentation

## 2022-08-28 DIAGNOSIS — Z79899 Other long term (current) drug therapy: Secondary | ICD-10-CM | POA: Insufficient documentation

## 2022-08-28 DIAGNOSIS — Z91040 Latex allergy status: Secondary | ICD-10-CM | POA: Insufficient documentation

## 2022-08-28 DIAGNOSIS — I2510 Atherosclerotic heart disease of native coronary artery without angina pectoris: Secondary | ICD-10-CM | POA: Diagnosis not present

## 2022-08-28 DIAGNOSIS — I10 Essential (primary) hypertension: Secondary | ICD-10-CM | POA: Insufficient documentation

## 2022-08-28 DIAGNOSIS — Z87891 Personal history of nicotine dependence: Secondary | ICD-10-CM | POA: Insufficient documentation

## 2022-08-28 DIAGNOSIS — K922 Gastrointestinal hemorrhage, unspecified: Secondary | ICD-10-CM | POA: Diagnosis not present

## 2022-08-28 DIAGNOSIS — K625 Hemorrhage of anus and rectum: Secondary | ICD-10-CM | POA: Diagnosis present

## 2022-08-28 LAB — CBC
HCT: 39.8 % (ref 39.0–52.0)
Hemoglobin: 13.4 g/dL (ref 13.0–17.0)
MCH: 32.1 pg (ref 26.0–34.0)
MCHC: 33.7 g/dL (ref 30.0–36.0)
MCV: 95.4 fL (ref 80.0–100.0)
Platelets: 179 10*3/uL (ref 150–400)
RBC: 4.17 MIL/uL — ABNORMAL LOW (ref 4.22–5.81)
RDW: 13.2 % (ref 11.5–15.5)
WBC: 4.9 10*3/uL (ref 4.0–10.5)
nRBC: 0 % (ref 0.0–0.2)

## 2022-08-28 LAB — CBC WITH DIFFERENTIAL/PLATELET
Abs Immature Granulocytes: 0.02 10*3/uL (ref 0.00–0.07)
Basophils Absolute: 0 10*3/uL (ref 0.0–0.1)
Basophils Relative: 1 %
Eosinophils Absolute: 0.2 10*3/uL (ref 0.0–0.5)
Eosinophils Relative: 4 %
HCT: 42.4 % (ref 39.0–52.0)
Hemoglobin: 14.3 g/dL (ref 13.0–17.0)
Immature Granulocytes: 1 %
Lymphocytes Relative: 25 %
Lymphs Abs: 0.9 10*3/uL (ref 0.7–4.0)
MCH: 32.1 pg (ref 26.0–34.0)
MCHC: 33.7 g/dL (ref 30.0–36.0)
MCV: 95.1 fL (ref 80.0–100.0)
Monocytes Absolute: 0.5 10*3/uL (ref 0.1–1.0)
Monocytes Relative: 14 %
Neutro Abs: 1.9 10*3/uL (ref 1.7–7.7)
Neutrophils Relative %: 55 %
Platelets: 206 10*3/uL (ref 150–400)
RBC: 4.46 MIL/uL (ref 4.22–5.81)
RDW: 13.2 % (ref 11.5–15.5)
WBC: 3.6 10*3/uL — ABNORMAL LOW (ref 4.0–10.5)
nRBC: 0 % (ref 0.0–0.2)

## 2022-08-28 LAB — BASIC METABOLIC PANEL
Anion gap: 7 (ref 5–15)
BUN: 12 mg/dL (ref 8–23)
CO2: 25 mmol/L (ref 22–32)
Calcium: 10.4 mg/dL — ABNORMAL HIGH (ref 8.9–10.3)
Chloride: 108 mmol/L (ref 98–111)
Creatinine, Ser: 0.74 mg/dL (ref 0.61–1.24)
GFR, Estimated: >60 mL/min (ref >60)
Glucose, Bld: 114 mg/dL — ABNORMAL HIGH (ref 70–99)
Potassium: 3.4 mmol/L — ABNORMAL LOW (ref 3.5–5.1)
Sodium: 140 mmol/L (ref 135–145)

## 2022-08-28 LAB — TYPE AND SCREEN
ABO/RH(D): A NEG
Antibody Screen: NEGATIVE

## 2022-08-28 LAB — PROTIME-INR
INR: 3.5 — ABNORMAL HIGH (ref 0.8–1.2)
Prothrombin Time: 34.6 seconds — ABNORMAL HIGH (ref 11.4–15.2)

## 2022-08-28 NOTE — Telephone Encounter (Signed)
Pt scheduled to see Vicie Mutters PA 09/19/22 at 1:30pm. Pt aware of appt.

## 2022-08-28 NOTE — Discharge Instructions (Signed)
We evaluated you for your rectal bleeding.  We spoke with gastroenterology, who wanted to schedule you for colonoscopy.  Your hemoglobin was stable and you did not have signs of severe blood loss, so we feel it is safe for discharge.  Your INR was slightly elevated today.  Please hold your warfarin this evening.  Please return to the emergency department if you develop any new or worsening symptoms such as recurrent bleeding, lightheadedness or dizziness, fainting, or any other concerning symptoms.

## 2022-08-28 NOTE — Telephone Encounter (Signed)
Seated call from the ER, [name] of Dr. Hilarie Fredrickson last colonoscopy 2020 showed severe diverticulosis several polyps recall 06/2022. [name] came into the office with complaining of bright red painless hematochezia.  Some dark stool. [name] hemodynamically stable, and no anemia. [name] is on Coumadin and had supratherapeutic INR 3.5. Discussed with ER physician and since [name]'s had no further bleeding and stable going to discharge home with ER precautions. We will plan for follow-up in our office.

## 2022-08-28 NOTE — ED Triage Notes (Signed)
Pt woke w/ rectal bleeding. Pt reports BRBPR w/o clots, states it took 4 flushes of commode to clear the water. Pt denies pain to abdomen.

## 2022-08-28 NOTE — Telephone Encounter (Signed)
Noted, patient is at the ED now.

## 2022-08-28 NOTE — ED Provider Notes (Signed)
[facility] DEPT Provider Note  CSN: [id number] Arrival date & time: 08/28/22 0300  Chief Complaint(s) GI Bleeding  HPI [name] is a 76 y.o. male with history of coronary artery disease, hypertension, hyperlipidemia, Alzheimer's disease on warfarin presenting to the emergency department with rectal bleeding.  Patient reports an episode of bright red bleeding per rectum today.  Reports that he had to flush the toilet 4 times.  Denies any clots.  Reports that he had some lightheadedness which is resolved.  Denies any abdominal pain.  Denies any diarrhea, constipation.  Denies any fevers or chills.  Denies any chest pain.  Denies syncope.  Symptoms mild.   Past Medical History Past Medical History:  Diagnosis Date   Allergy    Arthritis    CAD (coronary artery disease) 1996   POST CABG   Cataracts, bilateral    Chicken pox    Dyslipidemia    Hyperlipidemia    Hypertension    Sinus bradycardia    Patient Active Problem List   Diagnosis Date Noted   Mild neurocognitive disorder due to Alzheimer's disease ([place]) 11/05/2021   Hypertensive retinopathy of both eyes 02/21/2020   Former smoker 03/29/2019   Generalized osteoarthrosis, involving multiple sites 09/20/2018   Unstable gait 09/20/2018   Varicose veins of both lower extremities 06/04/2018   Vitamin D deficiency, unspecified 03/29/2018   Essential hypertension 10/22/2017   Hyperlipidemia 10/22/2017   Urinary hesitancy 02/10/2017   Primary hypercoagulable state ([place]) [D68.59] 02/09/2017   Encounter for therapeutic drug monitoring 02/09/2017   CAD (coronary artery disease) 02/09/2017   Anticoagulated 02/09/2017   Home Medication(s) Prior to Admission medications   Medication Sig Start Date End Date Taking? Authorizing Provider  acetaminophen (TYLENOL) 500 MG tablet Take 1,000 mg by mouth every 6 (six) hours as needed for moderate pain or headache.    [provider]   amLODipine (NORVASC) 5 MG tablet Take 1.5 tablets (7.5 mg total) by mouth daily. 01/24/22   Jettie Booze, MD  cholecalciferol (VITAMIN D) 1000 units tablet Take 1,000 Units by mouth daily.     [provider]  ciclopirox (PENLAC) 8 % solution Apply topically at bedtime. Apply over nail and surrounding skin. Apply daily over previous coat. After seven (7) days, may remove with alcohol and continue cycle. 08/06/21   McDonald, Stephan Minister, DPM  Cyanocobalamin (VITAMIN B12) 500 MCG TABS Take 500 mcg by mouth daily.     [provider]  divalproex (DEPAKOTE) 125 MG DR tablet Take 1 tab at night for 1 week, then increase to 2 tablets at night 10/11/21   Shawn Route, Coralee Pesa, PA-C  donepezil (ARICEPT) 10 MG tablet TAKE 1 TABLET AT BEDTIME 03/04/22   Rondel Jumbo, PA-C  ezetimibe (ZETIA) 10 MG tablet Take 1 tablet (10 mg total) by mouth at bedtime. 01/24/22   Jettie Booze, MD  fenofibrate (TRICOR) 145 MG tablet Take 1 tablet (145 mg total) by mouth at bedtime. 01/24/22   Jettie Booze, MD  furosemide (LASIX) 20 MG tablet Take 1 tablet (20 mg total) by mouth daily. May take an extra dose '20mg'$  twice a week for swelling 01/24/22   Jettie Booze, MD  losartan (COZAAR) 100 MG tablet Take 1 tablet (100 mg total) by mouth daily. 01/24/22   Jettie Booze, MD  memantine (NAMENDA) 5 MG tablet Take 1 tablet (5 mg at night) for 2 weeks, then increase to 1 tablet (5 mg) twice a day  07/16/22   Rondel Jumbo, PA-C  nitroGLYCERIN (NITROSTAT) 0.4 MG SL tablet Place 1 tablet (0.4 mg total) under the tongue every 5 (five) minutes as needed for chest pain. 01/24/22   Jettie Booze, MD  Olopatadine HCl (PATADAY OP) Place 1 drop into both eyes daily as needed (allergies).    [provider]  rosuvastatin (CRESTOR) 40 MG tablet Take 1 tablet (40 mg total) by mouth daily. 01/24/22   Jettie Booze, MD  traMADol (ULTRAM) 50 MG tablet TAKE 1 TABLET(50 MG) BY MOUTH DAILY AS  NEEDED 08/25/22   Martinique, Betty G, MD  warfarin (COUMADIN) 5 MG tablet TAKE AS DIRECTED BY COUMADIN CLINIC 08/08/22   Jettie Booze, MD                                                                                                                                    Past Surgical History Past Surgical History:  Procedure Laterality Date   BYPASS GRAFT  1996   CATARACT EXTRACTION Right    INGUINAL HERNIA REPAIR Right 04/09/2020   Procedure: LAPAROSCOPIC RIGHT INGUINAL HERNIA REPAIR WITH MESH;  Surgeon: Ralene Ok, MD;  Location: [facility];  Service: General;  Laterality: Right;   REFRACTIVE SURGERY Right    SKIN BIOPSY     head x 2   UMBILICAL HERNIA REPAIR  2003   Family History Family History  Problem Relation Age of Onset   Alzheimer's disease Mother    Cancer Father        prostate   Prostate cancer Father    AAA (abdominal aortic aneurysm) Sister    Hypertension Maternal Grandmother    Hypertension Paternal Grandmother    Colon cancer Neg Hx    Esophageal cancer Neg Hx    Pancreatic cancer Neg Hx    Liver disease Neg Hx     Social History Social History   Tobacco Use   Smoking status: Former    Types: Cigarettes    Quit date: 05/29/1982    Years since quitting: 40.2   Smokeless tobacco: Never  Substance Use Topics   Alcohol use: Yes    Comment: 0-2 drinks a night   Drug use: No   Allergies Other and Latex  Review of Systems Review of Systems  All other systems reviewed and are negative.   Physical Exam Vital Signs  I have reviewed the triage vital signs BP 132/78   Pulse (!) 55   Temp 98.3 F (36.8 C) (Oral)   Resp 20   Ht '6\' 2"'$  (1.88 m)   Wt 88 kg   SpO2 96%   BMI 24.91 kg/m  Physical Exam Vitals and nursing note reviewed.  Constitutional:      General: He is not in acute distress.    Appearance: Normal appearance.  HENT:     Mouth/Throat:     Mouth: Mucous membranes are moist.  Eyes:  Conjunctiva/sclera: Conjunctivae normal.   Cardiovascular:     Rate and Rhythm: Normal rate and regular rhythm.  Pulmonary:     Effort: Pulmonary effort is normal. No respiratory distress.     Breath sounds: Normal breath sounds.  Abdominal:     General: Abdomen is flat.     Palpations: Abdomen is soft.     Tenderness: There is no abdominal tenderness.  Genitourinary:    Comments: Chaperoned by NT.  Small amount of brown, very slightly maroon stool.  No bright red blood per rectum.  No rectal tenderness. Skin:    General: Skin is warm and dry.     Capillary Refill: Capillary refill takes less than 2 seconds.  Neurological:     Mental Status: He is alert and oriented to person, place, and time. Mental status is at baseline.  Psychiatric:        Mood and Affect: Mood normal.        Behavior: Behavior normal.     ED Results and Treatments Labs (all labs ordered are listed, but only abnormal results are displayed) Labs Reviewed  BASIC METABOLIC PANEL - Abnormal; Notable for the following components:      Result Value   Potassium 3.4 (*)    Glucose, Bld 114 (*)    Calcium 10.4 (*)    All other components within normal limits  CBC WITH DIFFERENTIAL/PLATELET - Abnormal; Notable for the following components:   WBC 3.6 (*)    All other components within normal limits  PROTIME-INR - Abnormal; Notable for the following components:   Prothrombin Time 34.6 (*)    INR 3.5 (*)    All other components within normal limits  CBC - Abnormal; Notable for the following components:   RBC 4.17 (*)    All other components within normal limits  TYPE AND SCREEN                                                                                                                          Radiology No results found.  Pertinent labs & imaging results that were available during my care of the patient were reviewed by me and considered in my medical decision making (see MDM for details).  Medications Ordered in ED Medications - No data to  display  Procedures Procedures  (including critical care time)  Medical Decision Making / ED Course   MDM:  76 year old male presenting to the emergency department with rectal bleeding.  Patient overall very well-appearing, vital signs reassuring.  No hypotension, tachycardia.  Rectal exam with very slight amount of maroon stool, mainly brown.  No melena, no gross blood.  Suspect lower GI bleeding from source such as diverticulosis, hemorrhoid.  Doubt aortoenteric fistula with no large-volume bleeding.  Lower concern for significant blood loss.  We will check CBC, INR, reassess.  Clinical Course as of 08/28/22 1550  Thu Aug 28, 2022  1249 Spoke with Vicie Mutters with [facility]. Given hemodynamically stable with no further episodes, she agrees with discharging and following up with [facility], she will help arrange follow up appointment. If it recurs he should return to the emergency department. Will discharge patient to home. All questions answered. Patient comfortable with plan of discharge. Return precautions discussed with patient and specified on the after visit summary.  [WS]    Clinical Course User Index [WS] Truett Mainland, Livingston Diones, MD     Additional history obtained: -Additional history obtained from family -External records from outside source obtained and reviewed including: Chart review including previous notes, labs, imaging, consultation notes   Lab Tests: -I ordered, reviewed, and interpreted labs.   The pertinent results include:   Labs Reviewed  BASIC METABOLIC PANEL - Abnormal; Notable for the following components:      Result Value   Potassium 3.4 (*)    Glucose, Bld 114 (*)    Calcium 10.4 (*)    All other components within normal limits  CBC WITH DIFFERENTIAL/PLATELET - Abnormal; Notable for the following components:   WBC 3.6  (*)    All other components within normal limits  PROTIME-INR - Abnormal; Notable for the following components:   Prothrombin Time 34.6 (*)    INR 3.5 (*)    All other components within normal limits  CBC - Abnormal; Notable for the following components:   RBC 4.17 (*)    All other components within normal limits  TYPE AND SCREEN      EKG   EKG Interpretation  Date/Time:    Ventricular Rate:    PR Interval:    QRS Duration:   QT Interval:    QTC Calculation:   R Axis:     Text Interpretation:            Medicines ordered and prescription drug management: No orders of the defined types were placed in this encounter.   -I have reviewed the patients home medicines and have made adjustments as needed   Consultations Obtained: I requested consultation with the gastroenterologist,  and discussed lab and imaging findings as well as pertinent plan - they recommend: discharge with close follow up and strict return precautions   Cardiac Monitoring: The patient was maintained on a cardiac monitor.  I personally viewed and interpreted the cardiac monitored which showed an underlying rhythm of: NSR  Social Determinants of Health:  Factors impacting patients care include: former smoker   Reevaluation: After the interventions noted above, I reevaluated the patient and found that they have resolved  Co morbidities that complicate the patient evaluation  Past Medical History:  Diagnosis Date   Allergy    Arthritis    CAD (coronary artery disease) 1996   POST CABG   Cataracts, bilateral    Chicken pox    Dyslipidemia    Hyperlipidemia  Hypertension    Sinus bradycardia       Dispostion: Discharge    Final Clinical Impression(s) / ED Diagnoses Final diagnoses:  Lower GI bleed     This chart was dictated using voice recognition software.  Despite best efforts to proofread,  errors can occur which can change the documentation meaning.    Cristie Hem, MD 08/28/22 [phone]

## 2022-08-28 NOTE — Telephone Encounter (Signed)
Pt's spouse called to inform MD that Pt is having active bleeding from the rectum and she will be taking him to [facility] this morning.   Mrs. [place] [phone]

## 2022-09-01 ENCOUNTER — Other Ambulatory Visit: Payer: Self-pay | Admitting: Physician Assistant

## 2022-09-01 ENCOUNTER — Ambulatory Visit: Payer: Medicare Other | Attending: Interventional Cardiology | Admitting: *Deleted

## 2022-09-01 DIAGNOSIS — M9903 Segmental and somatic dysfunction of lumbar region: Secondary | ICD-10-CM | POA: Diagnosis not present

## 2022-09-01 DIAGNOSIS — M9901 Segmental and somatic dysfunction of cervical region: Secondary | ICD-10-CM | POA: Diagnosis not present

## 2022-09-01 DIAGNOSIS — G309 Alzheimer's disease, unspecified: Secondary | ICD-10-CM

## 2022-09-01 DIAGNOSIS — M5124 Other intervertebral disc displacement, thoracic region: Secondary | ICD-10-CM | POA: Diagnosis not present

## 2022-09-01 DIAGNOSIS — M5022 Other cervical disc displacement, mid-cervical region, unspecified level: Secondary | ICD-10-CM | POA: Diagnosis not present

## 2022-09-01 DIAGNOSIS — M5032 Other cervical disc degeneration, mid-cervical region, unspecified level: Secondary | ICD-10-CM | POA: Diagnosis not present

## 2022-09-01 DIAGNOSIS — D6859 Other primary thrombophilia: Secondary | ICD-10-CM | POA: Insufficient documentation

## 2022-09-01 DIAGNOSIS — Z5181 Encounter for therapeutic drug level monitoring: Secondary | ICD-10-CM | POA: Insufficient documentation

## 2022-09-01 DIAGNOSIS — M9902 Segmental and somatic dysfunction of thoracic region: Secondary | ICD-10-CM | POA: Diagnosis not present

## 2022-09-01 LAB — POCT INR: INR: 1.4 — AB (ref 2.0–3.0)

## 2022-09-01 NOTE — Patient Instructions (Addendum)
Description   Today take 1 tablet and tomorrow take 1.5 tablets then continue taking warfarin 1 tablet daily except for 1/2 tablet on Mondays, Wednesdays and Fridays. Recheck INR in 1 week. Coumadin Clinic [phone]

## 2022-09-11 ENCOUNTER — Ambulatory Visit: Payer: Medicare Other | Attending: Interventional Cardiology

## 2022-09-11 DIAGNOSIS — Z5181 Encounter for therapeutic drug level monitoring: Secondary | ICD-10-CM

## 2022-09-11 DIAGNOSIS — D6859 Other primary thrombophilia: Secondary | ICD-10-CM

## 2022-09-11 LAB — POCT INR: INR: 2.6 (ref 2.0–3.0)

## 2022-09-11 NOTE — Patient Instructions (Signed)
continue taking warfarin 1 tablet daily except for 1/2 tablet on Mondays, Wednesdays and Fridays. Recheck INR in 4 weeks. Coumadin Clinic [phone]

## 2022-09-18 NOTE — Progress Notes (Signed)
09/19/2022 [name] [id number] [birth year]  Referring provider: Martinique, Betty G, MD Primary GI doctor: Dr. Hilarie Fredrickson  ASSESSMENT AND PLAN:   Rcent episode of painless hematochezia, on coumadin, history of adenomatous polyp of colon  With description, most like diverticular bleed but patient is due for colonoscopy this year for history of polyps will schedule for colonoscopy Due to severe cervical neck imobility and difficulty walking, will schedule in the hospital, will need to hold coumadin, will get permission from hematology Recheck CBC, INR improved to 2.6.  If any further bleeding, go to the ER We have discussed the risks of bleeding, infection, perforation, medication reactions, and remote risk of death associated with colonoscopy. All questions were answered and the patient acknowledges these risk and wishes to proceed.  Primary hypercoagulable state [facility]) [D68.59] Patient told to hold his Coumadin 5 days prior to time of procedure. Will instruct when and how to resume after procedure. We will communicate with his prescribing physician Dr. Irish Lack to ensure that holding his Coumadin is acceptable. We discussed the risk, benefits and alternatives to colonoscopy/endoscopy.  We also discussed the low but real risk of cardiovascular event such as heart attack, stroke, embolism, thrombosis or ischemia/infarct of other organs off Coumadin  and explained the need to seek urgent help if this occurs.  He is agreeable and wishes to proceed.  Unstable gait Cervical disc disorder Will be done at the hospital   History of Present Illness:  76 y.o. male  with a past medical history of coronary artery disease status post bypass 1996, hyperlipidemia, hypertension, Alzheimer's, History of hypercoagulable state on Coumadin and others listed below, returns to clinic today for evaluation of rectal bleeding.  06/14/2019 colonoscopy for screening purposes, good bowel prep, 5 polyps  ranging from 4 to 6 mm in size, severe diverticulosis, internal hemorrhoids, 3 of the polyps removed were tubular adenomas, recall 3 years. 08/28/2022 received call from the ER for patient with bright red painless hematochezia, he was hemodynamically stable, no anemia.  Patient's Coumadin was supratherapeutic at 3.5.  ER physician thought he was stable enough to be discharged with ER precautions, set up for follow-up in our office.  Patient presents today with his wife Butch Penny.  Patient states he was home alone, he had 3 events within seconds of each other with moderate- large volume bright red blood.  He has not seen any further bleeding.  No AB, no rectal pain at the time or since that time. He did have weakness at the time of the rectal bleeding, shaky.  Has never had prior to that episode.  No change in bowel habits, he has BM every day or every other day.  States he and his wife eat very healthy.  Has some indigestion, he has some issue with swallowing pills only, never with food. No nausea or vomiting.  He has been on the coumadin for 27 years, prescribed by Dr. Irish Lack He walks with a cane, has imbalance, with assistance able to get onto the bed.  He has severely limited cervical spine motion from OA.   He  reports that he quit smoking about 40 years ago. His smoking use included cigarettes. He has never used smokeless tobacco. He reports current alcohol use. He reports that he does not use drugs. His family history includes AAA (abdominal aortic aneurysm) in his sister; Alzheimer's disease in his mother; Cancer in his father; Hypertension in his maternal grandmother and paternal grandmother; Prostate cancer in his father.  Current Medications:    Current Outpatient Medications (Cardiovascular):    amLODipine (NORVASC) 5 MG tablet, Take 1.5 tablets (7.5 mg total) by mouth daily.   ezetimibe (ZETIA) 10 MG tablet, Take 1 tablet (10 mg total) by mouth at bedtime.   fenofibrate (TRICOR)  145 MG tablet, Take 1 tablet (145 mg total) by mouth at bedtime.   furosemide (LASIX) 20 MG tablet, Take 1 tablet (20 mg total) by mouth daily. May take an extra dose 65m twice a week for swelling   losartan (COZAAR) 100 MG tablet, Take 1 tablet (100 mg total) by mouth daily.   nitroGLYCERIN (NITROSTAT) 0.4 MG SL tablet, Place 1 tablet (0.4 mg total) under the tongue every 5 (five) minutes as needed for chest pain.   rosuvastatin (CRESTOR) 40 MG tablet, Take 1 tablet (40 mg total) by mouth daily.   Current Outpatient Medications (Analgesics):    traMADol (ULTRAM) 50 MG tablet, TAKE 1 TABLET(50 MG) BY MOUTH DAILY AS NEEDED   acetaminophen (TYLENOL) 500 MG tablet, Take 1,000 mg by mouth every 6 (six) hours as needed for moderate pain or headache. (Patient not taking: Reported on 09/19/2022)  Current Outpatient Medications (Hematological):    Cyanocobalamin (VITAMIN B12) 500 MCG TABS, Take 500 mcg by mouth daily.    warfarin (COUMADIN) 5 MG tablet, TAKE AS DIRECTED BY COUMADIN CLINIC  Current Outpatient Medications (Other):    cholecalciferol (VITAMIN D) 1000 units tablet, Take 1,000 Units by mouth daily.    ciclopirox (PENLAC) 8 % solution, Apply topically at bedtime. Apply over nail and surrounding skin. Apply daily over previous coat. After seven (7) days, may remove with alcohol and continue cycle.   divalproex (DEPAKOTE) 125 MG DR tablet, Take 1 tab at night for 1 week, then increase to 2 tablets at night   donepezil (ARICEPT) 10 MG tablet, TAKE 1 TABLET AT BEDTIME   memantine (NAMENDA) 5 MG tablet, Take 1 tablet (5 mg at night) for 2 weeks, then increase to 1 tablet (5 mg) twice a day   Olopatadine HCl (PATADAY OP), Place 1 drop into both eyes daily as needed (allergies).   PEG-KCl-NaCl-NaSulf-Na Asc-C (PLENVU) 140 g SOLR, Take 1 kit by mouth as directed. Use coupon: B[facility]: [id number] CNRX Group: [id number]:: [id number] Surgical History:  He  has a past surgical history that includes  Bypass Graft (1996); Umbilical hernia repair (2003); Cataract extraction (Right); Refractive surgery (Right); Skin biopsy; and Inguinal hernia repair (Right, 04/09/2020).  Current Medications, Allergies, Past Medical History, Past Surgical History, Family History and Social History were reviewed in [employer].  Physical Exam: BP 118/60   Pulse 68   Ht '6\' 1"'  (1.854 m)   Wt 192 lb 9.6 oz (87.4 kg)   SpO2 98%   BMI 25.41 kg/m  General:  Obese, elderly appearing male in no acute distress Neck: severely limited cervical neck range of motion, unable to extend at all, has cervical kyphosis Heart : Regular rate and rhythm; no murmurs Pulm: Clear anteriorly; no wheezing Abdomen:  Soft, Obese AB, Active bowel sounds. No tenderness . , No organomegaly appreciated. Rectal: Not evaluated Extremities:  with  3-4 + edema. Neurologic:  Alert and  oriented x4;  No focal deficits. Walks with cane.  Psych:  Cooperative. Normal mood and affect.   AVladimir Crofts PA-C 09/19/22

## 2022-09-19 ENCOUNTER — Ambulatory Visit (INDEPENDENT_AMBULATORY_CARE_PROVIDER_SITE_OTHER): Payer: Medicare Other | Admitting: Physician Assistant

## 2022-09-19 ENCOUNTER — Telehealth: Payer: Self-pay

## 2022-09-19 ENCOUNTER — Encounter: Payer: Self-pay | Admitting: Physician Assistant

## 2022-09-19 VITALS — BP 118/60 | HR 68 | Ht 73.0 in | Wt 192.6 lb

## 2022-09-19 DIAGNOSIS — Z86010 Personal history of colonic polyps: Secondary | ICD-10-CM

## 2022-09-19 DIAGNOSIS — R2681 Unsteadiness on feet: Secondary | ICD-10-CM

## 2022-09-19 DIAGNOSIS — D649 Anemia, unspecified: Secondary | ICD-10-CM

## 2022-09-19 DIAGNOSIS — M5090 Cervical disc disorder, unspecified, unspecified cervical region: Secondary | ICD-10-CM | POA: Diagnosis not present

## 2022-09-19 DIAGNOSIS — D6859 Other primary thrombophilia: Secondary | ICD-10-CM | POA: Diagnosis not present

## 2022-09-19 DIAGNOSIS — K625 Hemorrhage of anus and rectum: Secondary | ICD-10-CM | POA: Diagnosis not present

## 2022-09-19 MED ORDER — PLENVU 140 G PO SOLR: 1.0000 | ORAL | 0 refills | Status: DC

## 2022-09-19 NOTE — Patient Instructions (Signed)
_______________________________________________________  If you are age 75 or older, your body mass index should be between 23-30. Your Body mass index is 25.41 kg/m. If this is out of the aforementioned range listed, please consider follow up with your Primary Care Provider.  If you are age 5 or younger, your body mass index should be between 19-25. Your Body mass index is 25.41 kg/m. If this is out of the aformentioned range listed, please consider follow up with your Primary Care Provider.   ________________________________________________________  The [facility] providers would like to encourage you to use [facility] to communicate with providers for non-urgent requests or questions.  Due to long hold times on the telephone, sending your provider a message by [facility] may be a faster and more efficient way to get a response.  Please allow 48 business hours for a response.  Please remember that this is for non-urgent requests.  _______________________________________________________  [name] have been scheduled for a colonoscopy. Please follow written instructions given to you at your visit today.  Please pick up your prep supplies at the pharmacy within the next 1-3 days. If you use inhalers (even only as needed), please bring them with you on the day of your procedure.  Due to recent changes in healthcare laws, you may see the results of your imaging and laboratory studies on MyChart before your provider has had a chance to review them.  We understand that in some cases there may be results that are confusing or concerning to you. Not all laboratory results come back in the same time frame and the provider may be waiting for multiple results in order to interpret others.  Please give [place] 48 hours in order for your provider to thoroughly review all the results before contacting the office for clarification of your results.   It was a pleasure to see you today!  Thank you for trusting me with your  gastrointestinal care!

## 2022-09-19 NOTE — Telephone Encounter (Signed)
[facility] Pre-operative Risk Assessment     Request for surgical clearance:     Endoscopy Procedure  What type of surgery is being performed?     colonoscopy   When is this surgery scheduled?     11-20-2022  What type of clearance is required ?   Pharmacy  Are there any medications that need to be held prior to surgery and how long? Yes, coumadin, 5 days  Practice name and name of physician performing surgery?      [facility]  What is your office phone and fax number?      Phone- [phone]  [phone]  Anesthesia type (None, local, MAC, general) ?       MAC

## 2022-09-24 NOTE — Telephone Encounter (Signed)
Patient with diagnosis of hypercoagulable state on warfariin for anticoagulation.    Procedure: colonoscopy Date of procedure: 11/20/22  CrCl 108 Platelet count 179  Per office protocol, patient can hold warfarin for 5 days prior to procedure.   Patient WILL need bridging with Lovenox (enoxaparin) around procedure. INR followed by [facility].  Notes in Epic for next INR check to coordinate this.   **This guidance is not considered finalized until pre-operative APP has relayed final recommendations.**

## 2022-09-24 NOTE — Telephone Encounter (Signed)
   Patient [name]  DOB: 16-Jun-1946 MRN: [id number]  Primary Cardiologist: Larae Grooms, MD  Clinical pharmacists have reviewed the patient's past medical history, labs, and current medications as part of preoperative protocol coverage. The following recommendations have been made:  Patient with diagnosis of hypercoagulable state on warfariin for anticoagulation.     Procedure: colonoscopy Date of procedure: 11/20/22   CrCl 108 Platelet count 179   Per office protocol, patient can hold warfarin for 5 days prior to procedure.   Patient WILL need bridging with Lovenox (enoxaparin) around procedure. INR followed by [facility].  Notes in Epic for next INR check to coordinate this.   I will route this recommendation to the requesting party via Epic fax function and remove from pre-op pool.  Please call with questions.  Lenna Sciara, NP 09/24/2022, 12:02 PM

## 2022-09-25 NOTE — Telephone Encounter (Signed)
Patients wife Butch Penny returned call. I made her aware of the the Lovenox bridge needed for colonoscopy and the coumadin clinic would coordinate this for [facility].

## 2022-09-25 NOTE — Telephone Encounter (Signed)
Left message for pt to return call.

## 2022-09-25 NOTE — Progress Notes (Signed)
Addendum: Reviewed and agree with assessment and management plan. Nazaria Riesen M, MD  

## 2022-10-01 ENCOUNTER — Telehealth: Payer: Self-pay | Admitting: Physician Assistant

## 2022-10-01 NOTE — Telephone Encounter (Signed)
Inbound call from patients wife stating that patient needs pre auth for his prep for his upcoming procedure in December and is requesting a call back to discuss. Please advise.   [phone]

## 2022-10-03 NOTE — Telephone Encounter (Signed)
Patient will come to the office and pick up a free sample kit of Plenvu

## 2022-10-09 ENCOUNTER — Ambulatory Visit: Payer: Medicare Other | Attending: Cardiology

## 2022-10-09 DIAGNOSIS — Z5181 Encounter for therapeutic drug level monitoring: Secondary | ICD-10-CM | POA: Diagnosis not present

## 2022-10-09 DIAGNOSIS — D6859 Other primary thrombophilia: Secondary | ICD-10-CM | POA: Diagnosis not present

## 2022-10-09 LAB — POCT INR: INR: 3.8 — AB (ref 2.0–3.0)

## 2022-10-09 NOTE — Patient Instructions (Signed)
HOLD FRIDAY, [place] and SUNDAY and then continue taking warfarin 1 tablet daily except for 1/2 tablet on Mondays, Wednesdays and Fridays. Recheck INR in 1 week. [facility] [phone];  Eat greens tonight and Friday

## 2022-10-17 ENCOUNTER — Ambulatory Visit: Payer: Medicare Other | Attending: Cardiovascular Disease | Admitting: *Deleted

## 2022-10-17 DIAGNOSIS — D6859 Other primary thrombophilia: Secondary | ICD-10-CM

## 2022-10-17 DIAGNOSIS — Z5181 Encounter for therapeutic drug level monitoring: Secondary | ICD-10-CM | POA: Diagnosis not present

## 2022-10-17 LAB — POCT INR: INR: 2.1 (ref 2.0–3.0)

## 2022-10-17 NOTE — Patient Instructions (Signed)
Description   Continue taking warfarin 1 tablet daily except for 1/2 tablet on Mondays, Wednesdays and Fridays. Recheck INR in 3 weeks. [facility] [phone]

## 2022-10-21 ENCOUNTER — Ambulatory Visit: Payer: Medicare Other | Admitting: Podiatry

## 2022-10-23 ENCOUNTER — Telehealth: Payer: Self-pay

## 2022-10-23 NOTE — Telephone Encounter (Signed)
---  Caller states that her husband has a weeping wound on his left lower leg  10/23/2022 9:28:42 AM Clinical Call Markus Daft, RN, [place]  Pt has appt with PCP on 10/24/22 at 2p

## 2022-10-24 ENCOUNTER — Ambulatory Visit (INDEPENDENT_AMBULATORY_CARE_PROVIDER_SITE_OTHER): Payer: Medicare Other | Admitting: Family Medicine

## 2022-10-24 ENCOUNTER — Encounter: Payer: Self-pay | Admitting: Family Medicine

## 2022-10-24 VITALS — BP 132/80 | HR 62 | Temp 98.5°F | Resp 16 | Ht 73.0 in | Wt 192.1 lb

## 2022-10-24 DIAGNOSIS — R600 Localized edema: Secondary | ICD-10-CM

## 2022-10-24 DIAGNOSIS — I83029 Varicose veins of left lower extremity with ulcer of unspecified site: Secondary | ICD-10-CM

## 2022-10-24 DIAGNOSIS — I10 Essential (primary) hypertension: Secondary | ICD-10-CM

## 2022-10-24 DIAGNOSIS — Z23 Encounter for immunization: Secondary | ICD-10-CM

## 2022-10-24 DIAGNOSIS — M159 Polyosteoarthritis, unspecified: Secondary | ICD-10-CM | POA: Diagnosis not present

## 2022-10-24 DIAGNOSIS — L97929 Non-pressure chronic ulcer of unspecified part of left lower leg with unspecified severity: Secondary | ICD-10-CM | POA: Diagnosis not present

## 2022-10-24 DIAGNOSIS — D6859 Other primary thrombophilia: Secondary | ICD-10-CM | POA: Diagnosis not present

## 2022-10-24 DIAGNOSIS — I25118 Atherosclerotic heart disease of native coronary artery with other forms of angina pectoris: Secondary | ICD-10-CM

## 2022-10-24 MED ORDER — AMLODIPINE BESYLATE 5 MG PO TABS: 5.0000 mg | ORAL_TABLET | Freq: Every day | ORAL | 3 refills | Status: DC

## 2022-10-24 MED ORDER — DOXYCYCLINE HYCLATE 100 MG PO TABS: 100.0000 mg | ORAL_TABLET | Freq: Two times a day (BID) | ORAL | 0 refills | Status: AC

## 2022-10-24 NOTE — Progress Notes (Signed)
ACUTE VISIT Chief Complaint  Patient presents with   Wound Check    Left leg, x 2 days. Started burning today, yellow discharge.   HPI: [name] is a 76 y.o. male with medical hx significant for CAD, HTN, unstable gait, Alzheimer's disease, HLD, and hypercoagulable state on chronic anticoagulation, coumadin.here today with his wife complaining of of oozing from a leg LE ulcer that was noted on Monday.  His wife complement hx. He reports yellowish discharge from the wound. His wife has been using OTC Tegaderm on ulcer and non sticky bandage. No hx of trauma.  According to his wife, he has experienced" fluid retention", he is not taking Lasix because he did not feel like it was providing relief. He also reports drinking 10-12 glasses of ice water.  Edema worse after prolonged standing, recently visited the [place] and did a lot of walking. It is more pronounce on feet and ankles.  Edema is better with elevation and in the morning when he gets up, worse at the end of the day.  Negative for fever,chills, changes in appetite, chest pain, dyspnea, or palpitation.  HTN on Amlodipine 5 mg 1.5 tab daily and Losartan 100 mg daily. He is not checking BP. Negative for severe/frequent headache, visual changes,or focal weakness. Lab Results  Component Value Date   CREATININE 0.74 08/28/2022   BUN 12 08/28/2022   NA 140 08/28/2022   K 3.4 (L) 08/28/2022   CL 108 08/28/2022   CO2 25 08/28/2022   Additionally, the patient has been experiencing left big toe pain and edema, described as electric shocks, intermittent, and has not noticed toe erythema. H has an appt with podiatrist.  Chronic pain: He experiences daily pain, with some days being worse than others, and electric shocks in joints and other areas. He takes Tramadol 50 mg, not daily, he does not like to take daily medication but it helps with pain and his wife has noted that his gait is more stable when taking  Tramadol.  Evaluated in the ED on 08/28/22 for episode of rectal bleeding, which was thought to be due to diverticulosis. Colonoscopy in 06/2019. He has not had more episodes. He is scheduled for a colonoscopy on December 14th.  Review of Systems  Constitutional:  Positive for fatigue.  Respiratory:  Negative for cough and wheezing.   Gastrointestinal:  Negative for abdominal pain, nausea and vomiting.  Genitourinary:  Negative for decreased urine volume, dysuria and hematuria.  Musculoskeletal:  Positive for arthralgias, gait problem and neck pain.  Neurological:  Negative for syncope and facial asymmetry.  See other pertinent positives and negatives in HPI.  Current Outpatient Medications on File Prior to Visit  Medication Sig Dispense Refill   acetaminophen (TYLENOL) 500 MG tablet Take 1,000 mg by mouth every 6 (six) hours as needed for moderate pain or headache.     cholecalciferol (VITAMIN D) 1000 units tablet Take 1,000 Units by mouth daily.      ciclopirox (PENLAC) 8 % solution Apply topically at bedtime. Apply over nail and surrounding skin. Apply daily over previous coat. After seven (7) days, may remove with alcohol and continue cycle. 6.6 mL 6   Cyanocobalamin (VITAMIN B12) 500 MCG TABS Take 500 mcg by mouth daily.      divalproex (DEPAKOTE) 125 MG DR tablet Take 1 tab at night for 1 week, then increase to 2 tablets at night 180 tablet 3   donepezil (ARICEPT) 10 MG tablet TAKE 1 TABLET AT BEDTIME  90 tablet 3   ezetimibe (ZETIA) 10 MG tablet Take 1 tablet (10 mg total) by mouth at bedtime. 90 tablet 3   fenofibrate (TRICOR) 145 MG tablet Take 1 tablet (145 mg total) by mouth at bedtime. 90 tablet 3   furosemide (LASIX) 20 MG tablet Take 1 tablet (20 mg total) by mouth daily. May take an extra dose 6m twice a week for swelling 120 tablet 3   losartan (COZAAR) 100 MG tablet Take 1 tablet (100 mg total) by mouth daily. 90 tablet 3   memantine (NAMENDA) 5 MG tablet Take 1 tablet (5  mg at night) for 2 weeks, then increase to 1 tablet (5 mg) twice a day 60 tablet 11   nitroGLYCERIN (NITROSTAT) 0.4 MG SL tablet Place 1 tablet (0.4 mg total) under the tongue every 5 (five) minutes as needed for chest pain. 75 tablet 2   Olopatadine HCl (PATADAY OP) Place 1 drop into both eyes daily as needed (allergies).     PEG-KCl-NaCl-NaSulf-Na Asc-C (PLENVU) 140 g SOLR Take 1 kit by mouth as directed. Use coupon: BIN: [id number]: CNRX Group: [id number]:: [id number] each 0   rosuvastatin (CRESTOR) 40 MG tablet Take 1 tablet (40 mg total) by mouth daily. 90 tablet 3   traMADol (ULTRAM) 50 MG tablet TAKE 1 TABLET(50 MG) BY MOUTH DAILY AS NEEDED 30 tablet 0   warfarin (COUMADIN) 5 MG tablet TAKE AS DIRECTED BY COUMADIN CLINIC 80 tablet 3   No current facility-administered medications on file prior to visit.   Past Medical History:  Diagnosis Date   Allergy    Arthritis    CAD (coronary artery disease) 1996   POST CABG   Cataracts, bilateral    Chicken pox    Dyslipidemia    Hyperlipidemia    Hypertension    Sinus bradycardia    Allergies  Allergen Reactions   Other     Environmental allergies   Latex     Itchy hands    Social History   Socioeconomic History   Marital status: Married    Spouse name: [name]   Number of children: 2   Years of education: COLLEGE   Highest education level: Bachelor's degree (e.g., BA, AB, BS)  Occupational History   Occupation: RETIRED  Tobacco Use   Smoking status: Former    Types: Cigarettes    Quit date: 05/29/1982    Years since quitting: 40.4   Smokeless tobacco: Never  Substance and Sexual Activity   Alcohol use: Yes    Comment: 0-2 drinks a night   Drug use: No   Sexual activity: Not on file  Other Topics Concern   Not on file  Social History Narrative   Right handed   Caffeine   One story home   Social Determinants of Health   Financial Resource Strain: Low Risk  (10/23/2022)   Overall Financial Resource Strain  (CARDIA)    Difficulty of Paying Living Expenses: Not very hard  Food Insecurity: No Food Insecurity (10/23/2022)   Hunger Vital Sign    Worried About Running Out of Food in the Last Year: Never true    Ran Out of Food in the Last Year: Never true  Transportation Needs: No Transportation Needs (10/23/2022)   PRAPARE - THydrologist(Medical): No    Lack of Transportation (Non-Medical): No  Physical Activity: Inactive (10/23/2022)   Exercise Vital Sign    Days of Exercise per Week: 0 days  Minutes of Exercise per Session: 30 min  Stress: Stress Concern Present (10/23/2022)   [place]    Feeling of Stress : To some extent  Social Connections: Moderately Isolated (10/23/2022)   Social Connection and Isolation Panel [NHANES]    Frequency of Communication with Friends and Family: Twice a week    Frequency of Social Gatherings with Friends and Family: Once a week    Attends Religious Services: Never    Marine scientist or Organizations: No    Attends Archivist Meetings: Never    Marital Status: Married   Vitals:   10/24/22 1402  BP: 132/80  Pulse: 62  Resp: 16  Temp: 98.5 F (36.9 C)  SpO2: 97%   Body mass index is 25.35 kg/m.  Physical Exam Vitals and nursing note reviewed.  Constitutional:      General: He is not in acute distress.    Appearance: He is well-developed and normal weight.  HENT:     Head: Normocephalic and atraumatic.  Eyes:     Conjunctiva/sclera: Conjunctivae normal.  Cardiovascular:     Rate and Rhythm: Regular rhythm. Bradycardia present.     Heart sounds: Murmur (Soft SEM RUSB) heard.     Comments: Peri ankle and pedal edema,bilateral. DP pulses palpable. Varicose veins LE's, bilateral. Pulmonary:     Effort: Pulmonary effort is normal. No respiratory distress.     Breath sounds: Normal breath sounds.  Abdominal:     Palpations:  There is no hepatomegaly.  Musculoskeletal:     Thoracic back: Scoliosis present.     Right lower leg: 2+ Pitting Edema present.     Left lower leg: 2+ Pitting Edema present.       Legs:  Skin:    General: Skin is warm.     Findings: No erythema or rash.     Comments: Left great toe with no periungual edema or erythema but corner of toenail mildly imbedded. There is no pain with palpation.  Neurological:     Mental Status: He is alert. Mental status is at baseline.     Comments: Antalgic gait, assisted by a cane.  Psychiatric:        Mood and Affect: Mood and affect normal.   ASSESSMENT AND PLAN:  Venous ulcer of left leg (HCC) Assessment & Plan: LLE above medial malleolus. Mild erythema surrounded bullae beside ulcer, so recommend course of abx, Doxycycline. Some side effects discussed. Keep would clean with soap and water, keep it uncover a few times during the day. Referral to wound clinic placed. I do not think imaging is needed at this time.  Orders: -     AMB referral to wound care center -     Doxycycline Hyclate; Take 1 tablet (100 mg total) by mouth 2 (two) times daily for 7 days.  Dispense: 14 tablet; Refill: 0  Essential hypertension Assessment & Plan: BP is adequately controlled. We discussed some side effects of Amlodipine, instead stopping medication his wofe would like to try a lower dose. So decrease Amlodipine from 7.5 mg to 5 mg daily. Continue losartan 100 mg daily. Recommend monitoring BP regularly. F/U in 2 months, before if needed.  Orders: -     amLODIPine Besylate; Take 1 tablet (5 mg total) by mouth daily.  Dispense: 135 tablet; Refill: 3  Generalized osteoarthrosis, involving multiple sites Assessment & Plan: Tramadol 50 mg provides pain relief, we discussed some side effects. Recommend trying  taking Tramadol 50 mg daily for 14 days and monitor for functional changes.  Continue Fall precautions.   Bilateral lower extremity edema Assessment &  Plan: We discussed possible etiologies, venous most likely. Worse after recent travel with prolonged standing and more walking than usual.  Recommend resuming Furosemide 20 mg, 2 tabd daily for 5 days then 1 tab daily as needed. Explained that problem will not resolve with diuretic but it can help control it. Once ulcer heals he can try compression stockings but at this time recommend LE elevation a few times through the day.   Need for influenza vaccination -     Flu Vaccine QUAD High Dose(Fluad)  Primary hypercoagulable state [facility]) [D68.59] Assessment & Plan: On coumadin. Because started on Doxycycline today, instructed to call coumadin clinic to arrange appt in 2-3 days for INR check. Sent message to [place], Myrtis Hopping, RN. Instructed about warning signs.   I spent a total of 42 minutes in both face to face and non face to face activities for this visit on the date of this encounter. During this time history was obtained and documented, examination was performed, prior labs reviewed, and assessment/plan discussed. He will keep appt with podiatrist to address left great toe pain.  Return in about 2 months (around 12/24/2022) for chronic problems.  Leshay Desaulniers G. [place], MD  [facility]. [place] office.

## 2022-10-24 NOTE — Assessment & Plan Note (Signed)
On coumadin. Because started on Doxycycline today, instructed to call coumadin clinic to arrange appt in 2-3 days for INR check. Sent message to Hemphill, Myrtis Hopping, RN. Instructed about warning signs.

## 2022-10-24 NOTE — Assessment & Plan Note (Signed)
BP is adequately controlled. We discussed some side effects of Amlodipine, instead stopping medication his wofe would like to try a lower dose. So decrease Amlodipine from 7.5 mg to 5 mg daily. Continue losartan 100 mg daily. Recommend monitoring BP regularly. F/U in 2 months, before if needed.

## 2022-10-24 NOTE — Patient Instructions (Addendum)
A few things to remember from today's visit:  Essential hypertension - Plan: amLODipine (NORVASC) 5 MG tablet  Generalized osteoarthrosis, involving multiple sites  Venous ulcer of left leg ([place]) - Plan: AMB referral to wound care center, doxycycline (VIBRA-TABS) 100 MG tablet  Bilateral lower extremity edema  Anticoagulated Amlodipine could be aggravating swelling, try decreasing dose from 7.5 mg to 5 mg. Monitor blood pressure. Doxycycline for possible infection of ulcer. Coumadin check in 2-3 days. Lower extremity elevation. Try Furosemide 2 tabs daily for 5-7 days.  In regard to pain, try taking Tramadol daily.  If you need refills for medications you take chronically, please call your pharmacy. Do not use My Chart to request refills or for acute issues that need immediate attention. If you send a my chart message, it may take a few days to be addressed, specially if I am not in the office.  Please be sure medication list is accurate. If a new problem present, please set up appointment sooner than planned today.  Venous Ulcer A venous ulcer is a shallow sore on your lower leg. Venous ulcer is the most common type of lower leg ulcer. You may have venous ulcers on one leg or on both legs. This condition most often develops around your ankles. This type of ulcer may last for a long time (chronic ulcer) or it may return often (recurrent ulcer). What are the causes? This condition is caused by poor blood flow in your legs. The poor flow causes blood to pool in your legs. This can break the skin, causing an ulcer. What increases the risk? You are more likely to develop this condition if: You are 79 years of age or older. You are male. You are overweight. You are not active. You have had a leg ulcer in the past. You have varicose veins. You have clots in your lower leg veins (deep vein thrombosis). You have inflammation of your leg veins (phlebitis). You have recently been  pregnant. You smoke. What are the signs or symptoms? The main symptom of this condition is an open sore near your ankle. Other symptoms may include: Swelling. Thick skin. Fluid coming from the ulcer. Bleeding. Itching. Pain and swelling. This gets worse when you stand up and feels better when you raise your leg. Blotchy skin. Dark skin. How is this treated? This condition may be treated by: Keeping your leg raised (elevated). Wearing a type of bandage or stocking to keep pressure (compression) on the veins of your leg. Taking medicines, including antibiotic medicines. Cleaning your ulcer and removing any dead tissue from the wound. Using bandages and wraps that have medicines in them to cover your ulcer. Closing the wound using a piece of skin taken from another area of your body (graft). Follow these instructions at home: Medicines Take or apply over-the-counter and prescription medicines only as told by your doctor. If you were prescribed an antibiotic medicine, take it as told by your doctor. Do not stop using the antibiotic even if you start to feel better. Ask your doctor if you should take aspirin before long trips. Wound care Follow instructions from your doctor about how to take care of your wound. Make sure you: Wash your hands with soap and water before and after you change your bandage (dressing). If you cannot use soap and water, use hand sanitizer. Change your bandage as told by your doctor. If you had a skin graft, leave stitches (sutures) in place. These may need to stay in place  for 2 weeks or longer. Ask when you should remove your bandage. If your bandage is dry and sticks to your leg when you try to remove it, moisten or wet the bandage with saline solution or water to make it easier to remove. Once your bandage is off, check your wound each day for signs of infection. Have a caregiver do this for you if you are not able to do it yourself. Check for: More redness,  swelling, or pain. More fluid or blood. Warmth. Pus or a bad smell. Activity Do not sit for a long time without moving. Get up to take short walks every 1-2 hours. This is important. Ask for help if you feel weak or unsteady. Ask your doctor what level of activity is safe for you. Rest with your legs raised during the day. If you can, keep your legs above the level of your heart for 30 minutes, 3-4 times a day, or as told by your doctor. Do not sit with your legs crossed. General instructions  Wear elastic stockings, compression stockings, or support hose as told by your doctor. Raise the foot of your bed as told by your doctor. Do not use any products that contain nicotine or tobacco, such as cigarettes, e-cigarettes, and chewing tobacco. If you need help quitting, ask your doctor. Keep all follow-up visits as told by your doctor. This is important. Contact a doctor if: Your ulcer is getting larger or is not healing. Your pain gets worse. Get help right away if: You have more redness, swelling, or pain around your ulcer. You have more fluid or blood coming from your ulcer. Your ulcer feels warm to the touch. You have pus or a bad smell coming from your ulcer. You have a fever. Summary A venous ulcer is a shallow sore on your lower leg. Follow instructions from your doctor about how to take care of your wound. Check your wound each day for signs of infection. Take over-the-counter and prescription medicines only as told by your doctor. Keep all follow-up visits as told by your doctor. This is important. This information is not intended to replace advice given to you by your health care provider. Make sure you discuss any questions you have with your health care provider. Document Revised: 09/19/2021 Document Reviewed: 09/19/2021 Elsevier Patient Education  [place] few things to remember from today's visit:  Essential hypertension - Plan: amLODipine (NORVASC) 5 MG  tablet  Generalized osteoarthrosis, involving multiple sites  Venous ulcer of left leg ([place]) - Plan: AMB referral to wound care center, doxycycline (VIBRA-TABS) 100 MG tablet  Bilateral lower extremity edema  Anticoagulated  If you need refills for medications you take chronically, please call your pharmacy. Do not use My Chart to request refills or for acute issues that need immediate attention. If you send a my chart message, it may take a few days to be addressed, specially if I am not in the office.  Please be sure medication list is accurate. If a new problem present, please set up appointment sooner than planned today.

## 2022-10-24 NOTE — Assessment & Plan Note (Signed)
LLE above medial malleolus. Mild erythema surrounded bullae beside ulcer, so recommend course of abx, Doxycycline. Some side effects discussed. Keep would clean with soap and water, keep it uncover a few times during the day. Referral to wound clinic placed. I do not think imaging is needed at this time.

## 2022-10-24 NOTE — Assessment & Plan Note (Signed)
We discussed possible etiologies, venous most likely. Worse after recent travel with prolonged standing and more walking than usual.  Recommend resuming Furosemide 20 mg, 2 tabd daily for 5 days then 1 tab daily as needed. Explained that problem will not resolve with diuretic but it can help control it. Once ulcer heals he can try compression stockings but at this time recommend LE elevation a few times through the day.

## 2022-10-24 NOTE — Assessment & Plan Note (Addendum)
Tramadol 50 mg provides pain relief, we discussed some side effects. Recommend trying taking Tramadol 50 mg daily for 14 days and monitor for functional changes.  Continue Fall precautions.

## 2022-10-27 ENCOUNTER — Ambulatory Visit: Payer: Medicare Other | Attending: Interventional Cardiology | Admitting: *Deleted

## 2022-10-27 ENCOUNTER — Ambulatory Visit (INDEPENDENT_AMBULATORY_CARE_PROVIDER_SITE_OTHER): Payer: Medicare Other | Admitting: Podiatry

## 2022-10-27 DIAGNOSIS — M79674 Pain in right toe(s): Secondary | ICD-10-CM

## 2022-10-27 DIAGNOSIS — D6859 Other primary thrombophilia: Secondary | ICD-10-CM

## 2022-10-27 DIAGNOSIS — B351 Tinea unguium: Secondary | ICD-10-CM | POA: Diagnosis not present

## 2022-10-27 DIAGNOSIS — M79675 Pain in left toe(s): Secondary | ICD-10-CM | POA: Diagnosis not present

## 2022-10-27 DIAGNOSIS — Z5181 Encounter for therapeutic drug level monitoring: Secondary | ICD-10-CM | POA: Diagnosis not present

## 2022-10-27 LAB — POCT INR: INR: 3.3 — AB (ref 2.0–3.0)

## 2022-10-27 MED ORDER — ENOXAPARIN SODIUM 120 MG/0.8ML IJ SOSY: 120.0000 mg | PREFILLED_SYRINGE | INTRAMUSCULAR | 1 refills | Status: DC

## 2022-10-27 NOTE — Patient Instructions (Addendum)
Description   Do not take any warfarin tonight, take 1 tablet tomorrow, then take 1/2 tablet on Wednesday and 1/2 tablet Thursday then continue taking warfarin 1 tablet daily except for 1/2 tablet on Mondays, Wednesdays and Fridays. Recheck INR in 2 weeks. Coumadin Clinic [phone]

## 2022-10-28 NOTE — Progress Notes (Signed)
  Subjective:  Patient ID: [name], male    DOB: 12/01/1946,  MRN: [id number]  Chief Complaint  Patient presents with   Toe Pain    76 y.o. male presents with the above complaint. History confirmed with patient.  The left foot great toe became quite painful and has a sharp pain  Objective:  Physical Exam: warm, good capillary refill, no trophic changes or ulcerative lesions, normal DP and PT pulses, and significant edema today +2/3 pitting edema throughout the upper lower extremity, ingrowing medial nail border distally with onychomycosis.  Assessment:   1. Pain due to onychomycosis of toenails of both feet      Plan:  Patient was evaluated and treated and all questions answered.  Has had significant worsening swelling which she is being treated for.  I do think that the amount of swelling is contributing to the development of an ingrowing nail on the medial border of the hallux.  I recommended a slant back avulsion which I did today and debrided the nails in thickness and length with a sharp nail nipper of the corners.  No local anesthetic was required and if this is not improved we could consider a partial permanent matricectomy but I have concerns about his healing ability with the amount of edema he has right now.  He will see me back as needed if it worsens or returns  Return if symptoms worsen or fail to improve.

## 2022-11-11 ENCOUNTER — Ambulatory Visit: Payer: Medicare Other | Attending: Interventional Cardiology | Admitting: *Deleted

## 2022-11-11 DIAGNOSIS — Z5181 Encounter for therapeutic drug level monitoring: Secondary | ICD-10-CM

## 2022-11-11 DIAGNOSIS — D6859 Other primary thrombophilia: Secondary | ICD-10-CM | POA: Diagnosis not present

## 2022-11-11 LAB — POCT INR: INR: 3.5 — AB (ref 2.0–3.0)

## 2022-11-11 NOTE — Patient Instructions (Addendum)
Description   Do not take any warfarin tonight then start taking warfarin 1/2 tablet daily except for 1 tablet on 'Sundays, Tuesday, and Thursday. Recheck INR in 1 week post procedure. Coumadin Clinic [phone] or [phone]     12'$ /8: Last dose of warfarin.  12/9: No warfarin or enoxaparin (Lovenox).  12/10: Inject enoxaparin '120mg'$  in the fatty abdominal tissue at least 2 inches from the belly button daily & rotate sites. No warfarin.  12/11: Inject enoxaparin in the fatty tissue at 8am. No warfarin.  12/12: Inject enoxaparin in the fatty tissue at 8am. No warfarin.  12/13: Inject enoxaparin in the fatty tissue in the morning at 8 am (No PM dose). No warfarin.  12/14: Procedure Day - No enoxaparin - Resume warfarin in the evening or as directed by doctor (take an extra half tablet with usual dose then resume normal dose).  12/15: Resume enoxaparin inject in the fatty tissue at 8am and take warfarin (take an extra half tablet with usual dose then resume normal dose).  12/16: Inject enoxaparin in the fatty tissue at 8am and take warfarin  12/17: Inject enoxaparin in the fatty at 8am and take warfarin  12/18: Inject enoxaparin in the fatty tissue at 8am and take warfarin  12/19: Inject enoxaparin in the fatty tissue at 8am and take warfarin  12/20: Inject enoxaparin in the fatty tissue at 8am then report to warfarin appt to check INR.

## 2022-11-17 ENCOUNTER — Encounter: Payer: Medicare Other | Attending: Physician Assistant | Admitting: Physician Assistant

## 2022-11-17 DIAGNOSIS — I2510 Atherosclerotic heart disease of native coronary artery without angina pectoris: Secondary | ICD-10-CM | POA: Insufficient documentation

## 2022-11-17 DIAGNOSIS — I509 Heart failure, unspecified: Secondary | ICD-10-CM | POA: Insufficient documentation

## 2022-11-17 DIAGNOSIS — I872 Venous insufficiency (chronic) (peripheral): Secondary | ICD-10-CM | POA: Diagnosis not present

## 2022-11-17 DIAGNOSIS — F0390 Unspecified dementia without behavioral disturbance: Secondary | ICD-10-CM | POA: Diagnosis not present

## 2022-11-17 DIAGNOSIS — Z7901 Long term (current) use of anticoagulants: Secondary | ICD-10-CM | POA: Insufficient documentation

## 2022-11-17 DIAGNOSIS — I10 Essential (primary) hypertension: Secondary | ICD-10-CM | POA: Insufficient documentation

## 2022-11-17 DIAGNOSIS — I87332 Chronic venous hypertension (idiopathic) with ulcer and inflammation of left lower extremity: Secondary | ICD-10-CM | POA: Insufficient documentation

## 2022-11-17 DIAGNOSIS — I110 Hypertensive heart disease with heart failure: Secondary | ICD-10-CM | POA: Diagnosis not present

## 2022-11-17 DIAGNOSIS — L97822 Non-pressure chronic ulcer of other part of left lower leg with fat layer exposed: Secondary | ICD-10-CM | POA: Diagnosis not present

## 2022-11-17 NOTE — Progress Notes (Signed)
[name], [name] ([id number]) [id number] Page 1 of 5 Visit Report for 11/17/2022 Abuse Risk Screen Details Patient Name: Date of Service: [name], [name] 11/17/2022 10:00 A M Medical Record Number: [id number] Patient Account Number: [id number] Date of Birth/Sex: Treating RN: 1946/01/08 (76 y.o. Male) Rosalio Loud Primary Care Orra Nolde: [place], Betty Other Clinician: Referring Saydie Gerdts: Treating Noami Bove/Extender: Stone, Hoyt [place], Betty Weeks in Treatment: 0 Abuse Risk Screen Items Answer Electronic Signature(s) Signed: 11/17/2022 3:51:57 PM By: Rosalio Loud MSN RN CNS WTA Entered By: Rosalio Loud on 11/17/2022 10:58:34 -------------------------------------------------------------------------------- Activities of Daily Living Details Patient Name: Date of Service: [name], [name] 11/17/2022 10:00 A M Medical Record Number: [id number] Patient Account Number: [id number] Date of Birth/Sex: Treating RN: [birth year] (76 y.o. Male) Rosalio Loud Primary Care Sylvanna Burggraf: [place], Betty Other Clinician: Referring Rafeef Lau: Treating Carlson Belland/Extender: Stone, Hoyt [place], Betty Weeks in Treatment: 0 Activities of Daily Living Items Answer Activities of Daily Living (Please select one for each item) Drive Automobile Not Able T Medications ake Need Assistance Use T elephone Completely Able Care for Appearance Need Assistance Use T oilet Need Assistance Bath / Shower Need Assistance Dress Self Need Assistance Feed Self Completely Able Walk Completely Able Get In / Out Bed Completely Able Housework Not Able Prepare Meals Not Able Handle Money Not Able Shop for Self Not Able [name], [name] ([id number]) [id number] Page 2 of 5 Electronic Signature(s) Signed: 11/17/2022 3:51:57 PM By: Rosalio Loud MSN RN CNS WTA Entered By: Rosalio Loud on 11/17/2022  10:59:24 -------------------------------------------------------------------------------- Education Screening Details Patient Name: Date of Service: [name] 11/17/2022 10:00 A M Medical Record Number: [id number] Patient Account Number: [id number] Date of Birth/Sex: Treating RN: 07-Jun-1946 (76 y.o. Male) Rosalio Loud Primary Care Barbar Brede: [place], Betty Other Clinician: Referring Lauri Till: Treating Dathan Attia/Extender: Stone, Hoyt [place], [place] Weeks in Treatment: 0 Learning Preferences/Education Level/Primary Language Learning Preference: Explanation, Demonstration Highest Education Level: College or Above Preferred Language: English Cognitive Barrier Language Barrier: No Translator Needed: No Memory Deficit: No Emotional Barrier: No Cultural/Religious Beliefs Affecting Medical Care: No Physical Barrier Impaired Vision: No Impaired Hearing: No Decreased Hand dexterity: No Knowledge/Comprehension Knowledge Level: Medium Comprehension Level: Medium Ability to understand written instructions: Medium Ability to understand verbal instructions: Medium Motivation Anxiety Level: Calm Cooperation: Cooperative Education Importance: Acknowledges Need Interest in Health Problems: Asks Questions Perception: Coherent Willingness to Engage in Self-Management High Activities: Readiness to Engage in Self-Management High Activities: Electronic Signature(s) Signed: 11/17/2022 3:51:57 PM By: Rosalio Loud MSN RN CNS WTA Entered By: Rosalio Loud on 11/17/2022 11:00:16 [name] ([id number]) [id number] Page 3 of 5 -------------------------------------------------------------------------------- Fall Risk Assessment Details Patient Name: Date of Service: [name], [name] 11/17/2022 10:00 A M Medical Record Number: [id number] Patient Account Number: [id number] Date of Birth/Sex: Treating RN: January 09, 1946 (76 y.o. Male) Rosalio Loud Primary Care Sabra Sessler: [place], Betty Other Clinician: Referring Samiyyah Moffa: Treating Harm Jou/Extender: Stone, Hoyt [place], Betty Weeks in Treatment: 0 Fall Risk Assessment Items Have you had 2 or more falls in the last 12 monthso 0 No Have you had any fall that resulted in injury in the last 12 monthso 0 No FALLS RISK SCREEN History of falling - immediate or within 3 months 0 No Secondary diagnosis (Do you have 2 or more medical diagnoseso) 0 No Ambulatory aid None/bed rest/wheelchair/nurse 0 No Crutches/cane/walker 15 Yes Furniture 0 No Intravenous therapy Access/Saline/Heparin Lock 0 No Gait/Transferring Normal/ bed rest/ wheelchair 0 No Weak (short steps with or without shuffle, stooped but able to lift  head while walking, may seek 0 No support from furniture) Impaired (short steps with shuffle, may have difficulty arising from chair, head down, impaired 0 No balance) Mental Status Oriented to own ability 0 Yes Electronic Signature(s) Signed: 11/17/2022 3:51:57 PM By: Rosalio Loud MSN RN CNS WTA Entered By: Rosalio Loud on 11/17/2022 11:00:59 -------------------------------------------------------------------------------- Foot Assessment Details Patient Name: Date of Service: [name] 11/17/2022 10:00 A M Medical Record Number: [id number] Patient Account Number: [id number] Date of Birth/Sex: Treating RN: 12-Mar-1946 (76 y.o. Male) Rosalio Loud Primary Care Curley Fayette: [place], Betty Other Clinician: Referring Gregori Abril: Treating Obbie Lewallen/Extender: Stone, Hoyt [place], Betty Weeks in Treatment: 0 Foot Assessment Items Site Locations [name], [name] ([id number]) Herbert Moors [id number] Page 4 of 5 + = Sensation present, - = Sensation absent, C = Callus, U = Ulcer R = Redness, W = Warmth, M = Maceration, PU = Pre-ulcerative lesion F = Fissure, S = Swelling, D = Dryness Assessment Right: Left: Other Deformity: No No Prior Foot Ulcer:  No No Prior Amputation: No No Charcot Joint: No No Ambulatory Status: Ambulatory With Help Assistance Device: Cane Gait: Administrator, arts) Signed: 11/17/2022 3:51:57 PM By: Rosalio Loud MSN RN CNS WTA Entered By: Rosalio Loud on 11/17/2022 11:01:47 -------------------------------------------------------------------------------- Nutrition Risk Screening Details Patient Name: Date of Service: [name] 11/17/2022 10:00 A M Medical Record Number: [id number] Patient Account Number: [id number] Date of Birth/Sex: Treating RN: 1946-01-15 (76 y.o. Male) Rosalio Loud Primary Care Mehul Rudin: [place], Betty Other Clinician: Referring Ervey Fallin: Treating Sharnee Douglass/Extender: Stone, Hoyt [place], Betty Weeks in Treatment: 0 Height (in): 74 Weight (lbs): 194 Body Mass Index (BMI): 24.9 Nutrition Risk Screening Items Score Screening NUTRITION RISK SCREEN: I have an illness or condition that made me change the kind and/or amount of food I eat 0 No I eat fewer than two meals per day 0 No I eat few fruits and vegetables, or milk products 0 No I have three or more drinks of beer, liquor or wine almost every day 0 No I have tooth or mouth problems that make it hard for me to eat 0 No [name], [name] ([id number]) [id number] Page 5 of 5 I don't always have enough money to buy the food I need 0 No I eat alone most of the time 0 No I take three or more different prescribed or over-the-counter drugs a day 1 Yes Without wanting to, I have lost or gained 10 pounds in the last six months 0 No I am not always physically able to shop, cook and/or feed myself 2 Yes Nutrition Protocols Good Risk Protocol 0 No interventions needed Moderate Risk Protocol High Risk Proctocol Risk Level: Moderate Risk Score: 3 Electronic Signature(s) Signed: 11/17/2022 3:51:57 PM By: Rosalio Loud MSN RN CNS WTA Entered By: Rosalio Loud on 11/17/2022 11:01:23

## 2022-11-17 NOTE — Progress Notes (Signed)
VIAN, FLUEGEL ([id number]) [id number] Page 1 of 8 Visit Report for 11/17/2022 Chief Complaint Document Details Patient Name: Date of Service: [name], [name] 11/17/2022 10:00 A M Medical Record Number: [id number] Patient Account Number: [id number] Date of Birth/Sex: Treating RN: 28-Feb-1946 (76 y.o. Male) [name] Primary Care Provider: [place], Betty Other Clinician: Referring Provider: Treating Provider/Extender: Stone, Ashanta Amoroso [place], Betty Weeks in Treatment: 0 Information Obtained from: Patient Chief Complaint Left LE ulcer Electronic Signature(s) Signed: 11/17/2022 11:08:26 AM By: Worthy Keeler PA-C Entered By: Worthy Keeler on 11/17/2022 11:08:25 -------------------------------------------------------------------------------- HPI Details Patient Name: Date of Service: [name] 11/17/2022 10:00 [place] Record Number: [id number] Patient Account Number: [id number] Date of Birth/Sex: Treating RN: [birth year] (76 y.o. Male) [name] Primary Care Provider: [place], Betty Other Clinician: Referring Provider: Treating Provider/Extender: Stone, Ajna Moors [place], Betty Weeks in Treatment: 0 History of Present Illness HPI Description: 11-17-2022 patient presents for initial inspection here in our clinic today concerning issues has been having with his left leg. He had an area on the medial aspect which has been weeping and draining unfortunately. Fortunately there does not appear to be any signs of active infection systemically which is great news and overall I am extremely pleased in that regard. With that being said he did have an infection at 1 point apparently was prescribed doxycycline and that did get things under control which was great news. He does take Lasix he does not use compression socks on a regular basis. The doxycycline was given to him on 10-27-2022 as best I can tell and notes reviewed. Patient does have a history  of hypertension, coronary artery disease, and because of this he is on long-term anticoagulant therapy currently is on Lovenox. He also has chronic venous insufficiency apparently according to what I see in regard to his legs. Electronic Signature(s) Signed: 11/17/2022 11:58:47 AM By: Worthy Keeler PA-C Entered By: Worthy Keeler on 11/17/2022 11:58:47 [name] ([id number]) [id number] Page 2 of 8 -------------------------------------------------------------------------------- Physical Exam Details Patient Name: Date of Service: [name], [name] 11/17/2022 10:00 A M Medical Record Number: [id number] Patient Account Number: [id number] Date of Birth/Sex: Treating RN: Apr 02, 1946 (76 y.o. Male) [name] Primary Care Provider: [place], Betty Other Clinician: Referring Provider: Treating Provider/Extender: Stone, Burns Timson [place], Betty Weeks in Treatment: 0 Constitutional sitting or standing blood pressure is within target range for patient.. pulse regular and within target range for patient.Marland Kitchen respirations regular, non-labored and within target range for patient.Marland Kitchen temperature within target range for patient.. Well-nourished and well-hydrated in no acute distress. Eyes conjunctiva clear no eyelid edema noted. pupils equal round and reactive to light and accommodation. Ears, Nose, Mouth, and Throat no gross abnormality of ear auricles or external auditory canals. normal hearing noted during conversation. mucus membranes moist. Respiratory normal breathing without difficulty. Cardiovascular 2+ dorsalis pedis/posterior tibialis pulses. 2+ pitting edema of the bilateral lower extremities. Musculoskeletal normal gait and posture. no significant deformity or arthritic changes, no loss or range of motion, no clubbing. Psychiatric this patient is able to make decisions and demonstrates good insight into disease process. Alert and Oriented x 3. pleasant and  cooperative. Notes Upon inspection patient's wound actually appears to be doing much better than it still like it was at 1 point. Fortunately there does not appear to be any signs of active infection at this time which is great news and overall I am extremely pleased with where we stand today. No fevers, chills, nausea, vomiting, or diarrhea.  Electronic Signature(s) Signed: 11/17/2022 11:59:57 AM By: Worthy Keeler PA-C Entered By: Worthy Keeler on 11/17/2022 11:59:57 -------------------------------------------------------------------------------- Physician Orders Details Patient Name: Date of Service: [name] 11/17/2022 10:00 A M Medical Record Number: [id number] Patient Account Number: [id number] Date of Birth/Sex: Treating RN: 1946-11-23 (76 y.o. Male) [name] Primary Care Provider: [place], Betty Other Clinician: Referring Provider: Treating Provider/Extender: Stone, Angela Platner [place], Betty Weeks in Treatment: 0 Verbal / Phone Orders: No [name], [name] ([id number]) [id number] Page 3 of 8 Diagnosis Coding ICD-10 Coding Code Description [phone] Chronic venous hypertension (idiopathic) with ulcer and inflammation of left lower extremity L97.822 Non-pressure chronic ulcer of other part of left lower leg with fat layer exposed I10 Essential (primary) hypertension I25.10 Atherosclerotic heart disease of native coronary artery without angina pectoris Z79.01 Long term (current) use of anticoagulants Follow-up Appointments Return Appointment in 1 week. Bathing/ Shower/ Hygiene May shower with wound dressing protected with water repellent cover or cast protector. - Do Not Get Wrap Wet No tub bath. Wound Treatment Wound #1 - Lower Leg Wound Laterality: Left, Medial Cleanser: Soap and Water 1 x Per Week Discharge Instructions: Gently cleanse wound with antibacterial soap, rinse and pat dry prior to dressing wounds Secondary Dressing: ABD  Pad 5x9 (in/in) 1 x Per Week Discharge Instructions: Cover with ABD pad Secured With: Conform 4'' - Conforming Stretch Gauze Bandage 4x75 (in/in) 1 x Per Week Discharge Instructions: Apply as directed Compression Wrap: 3-LAYER WRAP - Profore Lite LF 3 Multilayer Compression Bandaging System 1 x Per Week Discharge Instructions: Apply 3 multi-layer wrap as prescribed. Electronic Signature(s) Signed: 11/17/2022 1:22:59 PM By: Worthy Keeler PA-C Signed: 11/17/2022 3:51:57 PM By: Timothy Loud MSN RN CNS WTA Entered By: [name] on 11/17/2022 11:16:26 -------------------------------------------------------------------------------- Problem List Details Patient Name: Date of Service: [name] 11/17/2022 10:00 A M Medical Record Number: [id number] Patient Account Number: [id number] Date of Birth/Sex: Treating RN: 07-22-1946 (76 y.o. Male) [name] Primary Care Provider: [place], Betty Other Clinician: Referring Provider: Treating Provider/Extender: Stone, Marcques Wrightsman [place], Betty Weeks in Treatment: 0 Active Problems ICD-10 Encounter Code Description Active Date MDM Diagnosis I87.332 Chronic venous hypertension (idiopathic) with ulcer and inflammation of left 11/17/2022 No Yes lower extremity L97.822 Non-pressure chronic ulcer of other part of left lower leg with fat layer exposed12/10/2022 No Yes EBRIMA, RANTA ([id number]) [id number] Page 4 of 8 I10 Essential (primary) hypertension 11/17/2022 No Yes I25.10 Atherosclerotic heart disease of native coronary artery without angina pectoris 11/17/2022 No Yes Z79.01 Long term (current) use of anticoagulants 11/17/2022 No Yes Inactive Problems Resolved Problems Electronic Signature(s) Signed: 11/17/2022 11:08:09 AM By: Worthy Keeler PA-C Entered By: Worthy Keeler on 11/17/2022 11:08:08 -------------------------------------------------------------------------------- Progress Note  Details Patient Name: Date of Service: [name] 11/17/2022 10:00 A M Medical Record Number: [id number] Patient Account Number: [id number] Date of Birth/Sex: Treating RN: Oct 11, 1946 (76 y.o. Male) [name] Primary Care Provider: [place], Betty Other Clinician: Referring Provider: Treating Provider/Extender: Stone, Fidela Cieslak [place], Betty Weeks in Treatment: 0 Subjective Chief Complaint Information obtained from Patient Left LE ulcer History of Present Illness (HPI) 11-17-2022 patient presents for initial inspection here in our clinic today concerning issues has been having with his left leg. He had an area on the medial aspect which has been weeping and draining unfortunately. Fortunately there does not appear to be any signs of active infection systemically which is great news and overall I am extremely pleased in that regard. With that being said he  did have an infection at 1 point apparently was prescribed doxycycline and that did get things under control which was great news. He does take Lasix he does not use compression socks on a regular basis. The doxycycline was given to him on 10-27-2022 as best I can tell and notes reviewed. Patient does have a history of hypertension, coronary artery disease, and because of this he is on long-term anticoagulant therapy currently is on Lovenox. He also has chronic venous insufficiency apparently according to what I see in regard to his legs. Patient History Unable to Obtain Patient History due to Dementia. Information obtained from Patient, Caregiver. Allergies latex (Severity: Mild, Reaction: itchy) Social History Former smoker, Alcohol Use - Never, Drug Use - No History, Caffeine Use - Rarely - coffee. Medical History Cardiovascular Patient has history of Congestive Heart Failure - CABG, Hypertension Endocrine Denies history of Type I Diabetes, Type II Diabetes GARRIT, MARROW ([id number])  [id number] Page 5 of 8 Neurologic Patient has history of Dementia Review of Systems (ROS) Integumentary (Skin) Complains or has symptoms of Wounds. Objective Constitutional sitting or standing blood pressure is within target range for patient.. pulse regular and within target range for patient.Marland Kitchen respirations regular, non-labored and within target range for patient.Marland Kitchen temperature within target range for patient.. Well-nourished and well-hydrated in no acute distress. Vitals Time Taken: 10:37 AM, Height: 74 in, Source: Stated, Weight: 194 lbs, Source: Stated, BMI: 24.9, Temperature: 98.0 F, Pulse: 47 bpm, Respiratory Rate: 16 breaths/min, Blood Pressure: 146/81 mmHg. Eyes conjunctiva clear no eyelid edema noted. pupils equal round and reactive to light and accommodation. Ears, Nose, Mouth, and Throat no gross abnormality of ear auricles or external auditory canals. normal hearing noted during conversation. mucus membranes moist. Respiratory normal breathing without difficulty. Cardiovascular 2+ dorsalis pedis/posterior tibialis pulses. 2+ pitting edema of the bilateral lower extremities. Musculoskeletal normal gait and posture. no significant deformity or arthritic changes, no loss or range of motion, no clubbing. Psychiatric this patient is able to make decisions and demonstrates good insight into disease process. Alert and Oriented x 3. pleasant and cooperative. General Notes: Upon inspection patient's wound actually appears to be doing much better than it still like it was at 1 point. Fortunately there does not appear to be any signs of active infection at this time which is great news and overall I am extremely pleased with where we stand today. No fevers, chills, nausea, vomiting, or diarrhea. Integumentary (Hair, Skin) Wound #1 status is Open. Original cause of wound was Gradually Appeared. The date acquired was: 10/20/2022. The wound is located on the  Left,Medial Lower Leg. The wound measures 3cm length x 5.3cm width x 0.1cm depth; 12.488cm^2 area and 1.249cm^3 volume. There is Fat Layer (Subcutaneous Tissue) exposed. There is no tunneling or undermining noted. There is a none present amount of drainage noted. There is medium (34-66%) red, pink granulation within the wound bed. There is no necrotic tissue within the wound bed. 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 There was a Three Layer Compression Therapy Procedure with a pre-treatment ABI of 1.2 by Timothy Loud, RN. Post procedure Diagnosis Wound #1: Same as  Pre-Procedure Plan JAVARI, BUFKIN ([id number]) [id number] Page 6 of 8 Follow-up Appointments: Return Appointment in 1 week. Bathing/ Shower/ Hygiene: May shower with wound dressing protected with water repellent cover or cast protector. - Do Not Get Wrap Wet No tub bath. WOUND #1: - Lower Leg Wound Laterality: Left, Medial Cleanser: Soap and Water 1 x Per Week/ Discharge Instructions: Gently cleanse wound with antibacterial soap, rinse and pat dry prior to dressing wounds Secondary Dressing: ABD Pad 5x9 (in/in) 1 x Per Week/ Discharge Instructions: Cover with ABD pad Secured With: Conform 4'' - Conforming Stretch Gauze Bandage 4x75 (in/in) 1 x Per Week/ Discharge Instructions: Apply as directed Com pression Wrap: 3-LAYER WRAP - Profore Lite LF 3 Multilayer Compression Bandaging System 1 x Per Week/ Discharge Instructions: Apply 3 multi-layer wrap as  prescribed. 1. Based on what I am seeing I do believe that the patient would benefit from a continuation of therapy currently with the compression therapy I think that a 3 layer compression wrap would be ideal here. 2. Also can recommend ABD pad and the AandD ointment to help with keeping the inflammation and dry skin under control. Also this will catch any drainage if there is any continued weeping. 3. Also can recommend to continue to elevate his legs he should also get compression socks between now and when he comes back we did give him the information for elastic therapy and then we will make any adjustments in therapy as we go forward from there. We will see patient back for reevaluation in 1 week here in the clinic. If anything worsens or changes patient will contact our office for additional recommendations. Electronic Signature(s) Signed: 11/17/2022 12:00:43 PM By: Worthy Keeler PA-C Entered By: Worthy Keeler on 11/17/2022 12:00:43 -------------------------------------------------------------------------------- ROS/PFSH Details Patient Name: Date of Service: [name] 11/17/2022 10:00 A M Medical Record Number: [id number] Patient Account Number: [id number] Date of Birth/Sex: Treating RN: 1946/07/20 (76 y.o. Male) [name] Primary Care Provider: [place], Betty Other Clinician: Referring Provider: Treating Provider/Extender: Stone, Shabana Armentrout [place], Betty Weeks in Treatment: 0 Unable to Obtain Patient History due to Dementia Information Obtained From Patient Caregiver Integumentary (Skin) Complaints and Symptoms: Positive for: Wounds Cardiovascular Medical History: Positive for: Congestive Heart Failure - CABG; Hypertension Endocrine Medical History: Negative for: Type I Diabetes; Type II Diabetes Neurologic Medical History: Positive for: Dementia JOEDY, EICKHOFF ([id number]) [id number] Page 7 of 8 Immunizations Pneumococcal  Vaccine: Received Pneumococcal Vaccination: Yes Received Pneumococcal Vaccination On or After 60th Birthday: Yes Implantable Devices None Family and Social History Former smoker; Alcohol Use: Never; Drug Use: No History; Caffeine Use: Rarely - coffee Electronic Signature(s) Signed: 11/17/2022 1:22:59 PM By: Worthy Keeler PA-C Signed: 11/17/2022 3:51:57 PM By: Timothy Loud MSN RN CNS WTA Entered By: [name] on 11/17/2022 10:58:26 -------------------------------------------------------------------------------- SuperBill Details Patient Name: Date of Service: [name] 11/17/2022 Medical Record Number: [id number] Patient Account Number: [id number] Date of Birth/Sex: Treating RN: 1946-03-26 (76 y.o. Male) [name] Primary Care Provider: [place], Betty Other Clinician: Referring Provider: Treating Provider/Extender: Stone, Rosealee Recinos [place], Betty Weeks in Treatment: 0 Diagnosis Coding ICD-10 Codes Code Description [phone] Chronic venous hypertension (idiopathic) with ulcer and inflammation of left lower extremity L97.822 Non-pressure chronic ulcer of other part of left lower leg with fat layer exposed I10 Essential (primary) hypertension I25.10 Atherosclerotic heart disease of native coronary artery without angina pectoris Z79.01 Long term (current) use of anticoagulants Facility Procedures : CPT4 Code: [id number] Description: [place] VISIT-LEV  4 EST PT Modifier: Quantity: 1 : CPT4 Code: [id number] Description: (Facility Use Only) [id number] - [place] COMPRS LWR LT LEG Modifier: Quantity: 1 Physician Procedures : CPT4 Code Description Modifier [id number] WC PHYS LEVEL 3 NEW PT ICD-10 Diagnosis Description I87.332 Chronic venous hypertension (idiopathic) with ulcer and inflammation of left lower extremity L97.822 Non-pressure chronic ulcer of other part of left  lower leg with fat layer exposed I10 Essential (primary) hypertension I25.10 Atherosclerotic  heart disease of native coronary artery without angina pectoris Quantity: 1 Electronic Signature(s) Signed: 11/17/2022 12:01:02 PM By: Worthy Keeler PA-C Entered By: Worthy Keeler on 11/17/2022 12:01:01 [name] ([id number]) [id number] Page 8 of 8

## 2022-11-17 NOTE — Progress Notes (Signed)
[name] ([id number]) [id number] Page 1 of 10 Visit Report for 11/17/2022 Allergy List Details Patient Name: Date of Service: [name], [name] 11/17/2022 10:00 A M Medical Record Number: [id number] Patient Account Number: [id number] Date of Birth/Sex: Treating RN: 03-02-1946 (76 y.o. Male) [name] Primary Care [name]: [name] Other Clinician: Referring [name]: Treating [name]/Extender: Lloyd, Timothy [name] [name]: 0 Allergies Active Allergies latex Reaction: itchy Severity: Mild Allergy Notes Electronic Signature(s) Signed: 11/17/2022 3:51:57 PM By: Timothy Loud MSN RN CNS WTA Entered By: [name] on 11/17/2022 10:55:40 -------------------------------------------------------------------------------- Arrival Information Details Patient Name: Date of Service: [name]. 11/17/2022 10:00 A M Medical Record Number: [id number] Patient Account Number: [id number] Date of Birth/Sex: Treating RN: [birth year] (76 y.o. Male) [name] Primary Care [name]: [name] Other Clinician: Referring [name]: Treating [name]/Extender: Lloyd, Timothy [name] [name]: 0 Visit Information Patient Arrived: [name] Arrival Time: 10:35 Accompanied By: wife Transfer Assistance: None Patient Identification Verified: Yes Secondary Verification Process Completed: Yes Patient Requires Transmission-Based Precautions: No Patient Has Alerts: Yes Patient Alerts: Patient on Blood Thinner Coumadin on Hold Lovenox injections [name] ([id number]) [id number] Page 2 of 10 Electronic Signature(s) Signed: 11/17/2022 3:51:57 PM By: Timothy Loud MSN RN CNS WTA Entered By: [name] on 11/17/2022 10:36:17 -------------------------------------------------------------------------------- Clinic Level of Care Assessment Details Patient Name: Date of Service: [name], [name] 11/17/2022 10:00 A M Medical Record Number: [id number] Patient Account Number: [id number] Date of Birth/Sex: Treating RN: Apr 29, 1946 (76 y.o. Male) [name] Primary Care [name]: [name] Other Clinician: Referring [name]: Treating [name]/Extender: Lloyd, Timothy [name] [name]: 0 Clinic Level of Care Assessment Items TOOL 2 Quantity Score X- 1 0 Use when only an EandM is performed on the INITIAL visit ASSESSMENTS - Nursing Assessment / Reassessment X- 1 20 General Physical Exam (combine w/ comprehensive assessment (listed just below) when performed on new pt. evals) X- 1 25 Comprehensive Assessment (HX, ROS, Risk Assessments, Wounds Hx, etc.) ASSESSMENTS - Wound and Skin A ssessment / Reassessment X - Simple Wound Assessment / Reassessment - one wound 1 5 '[]'$  - 0 Complex Wound Assessment / Reassessment - multiple wounds '[]'$  - 0 Dermatologic / Skin Assessment (not related to wound area) ASSESSMENTS - Ostomy and/or Continence Assessment and Care '[]'$  - 0 Incontinence Assessment and Management '[]'$  - 0 Ostomy Care Assessment and Management (repouching, etc.) PROCESS - Coordination of Care X - Simple Patient / Family Education for ongoing care 1 15 '[]'$  - 0 Complex (extensive) Patient / Family Education for ongoing care '[]'$  - 0 Staff obtains Programmer, systems, Records, T Results / Process Orders est '[]'$  - 0 Staff telephones HHA, Nursing Homes / Clarify orders / etc '[]'$  - 0 Routine Transfer to another Facility (non-emergent condition) '[]'$  - 0 Routine Hospital Admission (non-emergent condition) X- 1 15 New Admissions / Biomedical engineer / Ordering NPWT Apligraf, etc. , '[]'$  - 0 Emergency Hospital Admission (emergent condition) X- 1 10 Simple Discharge Coordination '[]'$  - 0 Complex (extensive) Discharge Coordination PROCESS - Special Needs '[]'$  - 0 Pediatric / Minor Patient Management '[]'$  - 0 Isolation Patient Management '[]'$  - 0 Hearing / Language /  Visual special needs '[]'$  - 0 Assessment of Community assistance (transportation, D/C planning, etc.) '[]'$  - 0 Additional assistance / Altered mentation [name], [name] ([id number]) [id number] Page 3 of 10 '[]'$  - 0 Support Surface(s) Assessment (bed, cushion, seat, etc.) INTERVENTIONS - Wound Cleansing / Measurement X- 1 5 Wound Imaging (photographs -  any number of wounds) '[]'$  - 0 Wound Tracing (instead of photographs) X- 1 5 Simple Wound Measurement - one wound '[]'$  - 0 Complex Wound Measurement - multiple wounds X- 1 5 Simple Wound Cleansing - one wound '[]'$  - 0 Complex Wound Cleansing - multiple wounds INTERVENTIONS - Wound Dressings X - Small Wound Dressing one or multiple wounds 1 10 '[]'$  - 0 Medium Wound Dressing one or multiple wounds '[]'$  - 0 Large Wound Dressing one or multiple wounds '[]'$  - 0 Application of Medications - injection INTERVENTIONS - Miscellaneous '[]'$  - 0 External ear exam '[]'$  - 0 Specimen Collection (cultures, biopsies, blood, body fluids, etc.) '[]'$  - 0 Specimen(s) / Culture(s) sent or taken to Lab for analysis '[]'$  - 0 Patient Transfer (multiple staff / Harrel Lemon Lift / Similar devices) '[]'$  - 0 Simple Staple / Suture removal (25 or less) '[]'$  - 0 Complex Staple / Suture removal (26 or more) '[]'$  - 0 Hypo / Hyperglycemic Management (close monitor of Blood Glucose) X- 1 15 Ankle / Brachial Index (ABI) - do not check if billed separately Has the patient been seen at the hospital within the last three years: Yes Total Score: 130 Level Of Care: New/Established - Level 4 Electronic Signature(s) Signed: 11/17/2022 3:51:57 PM By: Timothy Loud MSN RN CNS WTA Entered By: [name] on 11/17/2022 11:17:15 -------------------------------------------------------------------------------- Compression Therapy Details Patient Name: Date of Service: [name] 11/17/2022 10:00 A M Medical Record Number: [id number] Patient Account Number:  [id number] Date of Birth/Sex: Treating RN: 06-29-1946 (76 y.o. Male) [name] Primary Care Hildy Nicholl: [name] Other Clinician: Referring Garner Dullea: Treating Kyre Jeffries/Extender: Lloyd, Timothy [name] [name]: 0 Compression Therapy Performed for Wound Assessment: Wound #1 Left,Medial Lower Leg Performed By: Clinician Timothy Loud, RN Compression Type: Three Layer Pre Lloyd ABI: 1.2 Post Procedure Diagnosis Same as Pre-procedure [name], [name] ([id number]) [id number] Page 4 of 10 Electronic Signature(s) Signed: 11/17/2022 3:51:57 PM By: Timothy Loud MSN RN CNS WTA Entered By: [name] on 11/17/2022 11:11:10 -------------------------------------------------------------------------------- Encounter Discharge Information Details Patient Name: Date of Service: [name]. 11/17/2022 10:00 [facility] Record Number: [id number] Patient Account Number: [id number] Date of Birth/Sex: Treating RN: 01-13-1946 (75 y.o. Male) [name] Primary Care Tauni Sanks: [name] Other Clinician: Referring Khaza Blansett: Treating Alexiah Koroma/Extender: Lloyd, Timothy [name] [name]: 0 Encounter Discharge Information Items Discharge Condition: Stable Ambulatory Status: Cane Discharge Destination: Home Transportation: Private Auto Accompanied By: wife Schedule Follow-up Appointment: Yes Clinical Summary of Care: Electronic Signature(s) Signed: 11/17/2022 3:51:57 PM By: Timothy Loud MSN RN CNS WTA Entered By: [name] on 11/17/2022 11:20:03 -------------------------------------------------------------------------------- Lower Extremity Assessment Details Patient Name: Date of Service: [name] 11/17/2022 10:00 A M Medical Record Number: [id number] Patient Account Number: [id number] Date of Birth/Sex: Treating RN: 31-Aug-1946 (76 y.o. Male) [name] Primary Care Dory Demont: [name] Other  Clinician: Referring Amaziah Raisanen: Treating Montie Gelardi/Extender: Lloyd, Timothy [name] [name]: 0 Edema Assessment Assessed: [Left: No] [Right: No] Edema: [Left: Ye] [Right: s] Calf Left: Right: Point of Measurement: 38 cm From Medial Instep 38 cm Ankle DANEN, LAPAGLIA ([id number]) [id number] Page 5 of 10 Left: Right: Point of Measurement: 11 cm From Medial Instep 30.5 cm Vascular Assessment Pulses: Popliteal Doppler Audible: [Left:Yes] Blood Pressure: Brachial: [Left:90] Ankle: [Left:Dorsalis Pedis: 110 1.22] Electronic Signature(s) Signed: 11/17/2022 3:51:57 PM By: Timothy Loud MSN RN CNS WTA Entered By: [name] on 11/17/2022 11:20:29 -------------------------------------------------------------------------------- Multi Wound Chart Details Patient Name: Date of Service: [facility]  DANIELA, HERNAN 11/17/2022 10:00 A M Medical Record Number: [id number] Patient Account Number: [id number] Date of Birth/Sex: Treating RN: 04/16/1946 (76 y.o. Male) [name] Primary Care Pierce Biagini: [name] Other Clinician: Referring Kamani Magnussen: Treating Anastasiya Gowin/Extender: Lloyd, Timothy [name] [name]: 0 Vital Signs Height(in): 74 Pulse(bpm): 47 Weight(lbs): 194 Blood Pressure(mmHg): 146/81 Body Mass Index(BMI): 24.9 Temperature(F): 98.0 Respiratory Rate(breaths/min): 16 [1:Photos:] [N/A:N/A] Left, Medial Lower Leg N/A N/A Wound Location: Gradually Appeared N/A N/A Wounding Event: Venous Leg Ulcer N/A N/A Primary Etiology: 10/20/2022 N/A N/A Date Acquired: 0 N/A N/A Timothy of Lloyd: Open N/A N/A Wound Status: No N/A N/A Wound Recurrence: 3x5.3x0.1 N/A N/A Measurements L x W x D (cm) 12.488 N/A N/A A (cm) : rea 1.249 N/A N/A Volume (cm) : Partial Thickness N/A N/A Classification: None Present N/A N/A Exudate A mount: Medium (34-66%) N/A N/A Granulation A mount: Red, Pink N/A N/A Granulation  Quality: None Present (0%) N/A N/A Necrotic A mount: Fat Layer (Subcutaneous Tissue): Yes N/A N/A Exposed Structures: Fascia: No [name], [name] ([id number]) [id number] Page 6 of 10 Tendon: No Muscle: No Joint: No Bone: No Medium (34-66%) N/A N/A Epithelialization: Lloyd Notes Electronic Signature(s) Signed: 11/17/2022 3:51:57 PM By: Timothy Loud MSN RN CNS WTA Entered By: [name] on 11/17/2022 11:09:10 -------------------------------------------------------------------------------- Multi-Disciplinary Care Plan Details Patient Name: Date of Service: [name]. 11/17/2022 10:00 A M Medical Record Number: [id number] Patient Account Number: [id number] Date of Birth/Sex: Treating RN: [birth year] (76 y.o. Male) [name] Primary Care Christalyn Goertz: [name] Other Clinician: Referring Bijan Ridgley: Treating Elena Davia/Extender: Lloyd, Timothy [name] [name]: 0 Active Inactive Orientation to the Wound Care Program Nursing Diagnoses: Knowledge deficit related to the wound healing center program Goals: Patient/caregiver will verbalize understanding of the [facility] Program Date Initiated: 11/17/2022 Target Resolution Date: 12/18/2022 Goal Status: Active Interventions: Provide education on orientation to the wound center Notes: Wound/Skin Impairment Nursing Diagnoses: Impaired tissue integrity Knowledge deficit related to ulceration/compromised skin integrity Goals: Patient will have a decrease in wound volume by X% from date: (specify in notes) Date Initiated: 11/17/2022 Target Resolution Date: 12/18/2022 Goal Status: Active Patient/caregiver will verbalize understanding of skin care regimen Date Initiated: 11/17/2022 Target Resolution Date: 12/18/2022 Goal Status: Active Interventions: Assess patient/caregiver ability to obtain necessary supplies Assess patient/caregiver ability to perform ulcer/skin  care regimen upon admission and as needed Assess ulceration(s) every visit Provide education on ulcer and skin care Notes: [name], [name] ([id number]) [id number] Page 7 of 10 Electronic Signature(s) Signed: 11/17/2022 3:51:57 PM By: Timothy Loud MSN RN CNS WTA Entered By: [name] on 11/17/2022 11:19:12 -------------------------------------------------------------------------------- Pain Assessment Details Patient Name: Date of Service: [name] 11/17/2022 10:00 A M Medical Record Number: [id number] Patient Account Number: [id number] Date of Birth/Sex: Treating RN: October 22, 1946 (76 y.o. Male) [name] Primary Care Briggs Edelen: [name] Other Clinician: Referring Marlita Keil: Treating Maripat Borba/Extender: Lloyd, Timothy [name] [name]: 0 Active Problems Location of Pain Severity and Description of Pain Patient Has Paino Yes Site Locations Pain Location: Generalized Pain Rate the pain. Current Pain Level: 6 Worst Pain Level: 8 Least Pain Level: 1 Tolerable Pain Level: 3 Character of Pain Describe the Pain: Aching, Tender Pain Management and Medication Current Pain Management: Medication: No Cold Application: No Rest: No Massage: No Activity: No T.E.N.S.: No Heat Application: No Leg drop or elevation: No Is the Current Pain Management Adequate: Inadequate How does your wound impact your activities of daily livingo Sleep: No Bathing: No Appetite:  No Relationship With Others: No Bladder Continence: No Emotions: No Bowel Continence: No Work: No Toileting: No Drive: No Dressing: No Hobbies: No Electronic Signature(s) Signed: 11/17/2022 3:51:57 PM By: Timothy Loud MSN RN CNS WTA Entered By: [name] on 11/17/2022 10:37:20 [name] ([id number]) [id number] Page 8 of 10 -------------------------------------------------------------------------------- Patient/Caregiver  Education Details Patient Name: Date of Service: [name], [name] 12/11/2023andnbsp10:00 [place] Record Number: [id number] Patient Account Number: [id number] Date of Birth/Gender: Treating RN: 07-Nov-1946 (76 y.o. Male) [name] Primary Care Physician: [name] Other Clinician: Referring Physician: Treating Physician/Extender: Lloyd, Timothy [name] [name]: 0 Education Assessment Education Provided To: Patient and Caregiver Education Topics Provided [place]: o Handouts: Welcome T [facility] o Methods: Explain/Verbal Responses: State content correctly Wound/Skin Impairment: Handouts: Caring for Your Ulcer Methods: Explain/Verbal Responses: State content correctly Electronic Signature(s) Signed: 11/17/2022 3:51:57 PM By: Timothy Loud MSN RN CNS WTA Entered By: [name] on 11/17/2022 11:18:02 -------------------------------------------------------------------------------- Wound Assessment Details Patient Name: Date of Service: [name] 11/17/2022 10:00 A M Medical Record Number: [id number] Patient Account Number: [id number] Date of Birth/Sex: Treating RN: 07-Apr-1946 (76 y.o. Male) [name] Primary Care Zachariah Pavek: [name] Other Clinician: Referring Carma Dwiggins: Treating Fausto Sampedro/Extender: Lloyd, Timothy [name] [name]: 0 Wound Status Wound Number: 1 Primary Etiology: Venous Leg Ulcer Wound Location: Left, Medial Lower Leg Wound Status: Open Wounding Event: Gradually Appeared Date Acquired: 10/20/2022 Timothy Of Lloyd: 0 Clustered Wound: No [name], [name] ([id number]) [id number] Page 9 of 10 Photos Wound Measurements Length: (cm) 3 Width: (cm) 5.3 Depth: (cm) 0.1 Area: (cm) 12.488 Volume: (cm) 1.249 % Reduction in Area: % Reduction in Volume: Epithelialization: Medium (34-66%) Tunneling: No Undermining: No Wound  Description Classification: Partial Thickness Exudate Amount: None Present Foul Odor After Cleansing: No Wound Bed Granulation Amount: Medium (34-66%) Exposed Structure Granulation Quality: Red, Pink Fascia Exposed: No Necrotic Amount: None Present (0%) Fat Layer (Subcutaneous Tissue) Exposed: Yes Tendon Exposed: No Muscle Exposed: No Joint Exposed: No Bone Exposed: No Lloyd Notes Wound #1 (Lower Leg) Wound Laterality: Left, Medial Cleanser Soap and Water Discharge Instruction: Gently cleanse wound with antibacterial soap, rinse and pat dry prior to dressing wounds Peri-Wound Care Topical Primary Dressing Secondary Dressing ABD Pad 5x9 (in/in) Discharge Instruction: Cover with ABD pad Secured With Conform 4'' - Conforming Stretch Gauze Bandage 4x75 (in/in) Discharge Instruction: Apply as directed Compression Wrap 3-LAYER WRAP - Profore Lite LF 3 Multilayer Compression Bandaging System Discharge Instruction: Apply 3 multi-layer wrap as prescribed. Compression Stockings Add-Ons Electronic Signature(s) Signed: 11/17/2022 3:51:57 PM By: Timothy Loud MSN RN CNS WTA Entered By: [name] on 11/17/2022 10:46:16 [name] ([id number]) [id number].pdf Page 10 of 10 -------------------------------------------------------------------------------- Vitals Details Patient Name: Date of Service: [name], [name] 11/17/2022 10:00 A M Medical Record Number: [id number] Patient Account Number: [id number] Date of Birth/Sex: Treating RN: May 19, 1946 (76 y.o. Male) [name] Primary Care Kemisha Bonnette: [name] Other Clinician: Referring Bryer Gottsch: Treating Willona Phariss/Extender: Lloyd, Timothy [name] [name]: 0 Vital Signs Time Taken: 10:37 Temperature (F): 98.0 Height (in): 74 Pulse (bpm): 47 Source: Stated Respiratory Rate (breaths/min): 16 Weight (lbs): 194 Blood Pressure (mmHg): 146/81 Source: Stated Reference Range: 80 -  120 mg / dl Body Mass Index (BMI): 24.9 Electronic Signature(s) Signed: 11/17/2022 3:51:57 PM By: Timothy Loud MSN RN CNS WTA Entered By: [name] on 11/17/2022 10:40:02

## 2022-11-18 DIAGNOSIS — D3131 Benign neoplasm of right choroid: Secondary | ICD-10-CM | POA: Diagnosis not present

## 2022-11-18 DIAGNOSIS — H2512 Age-related nuclear cataract, left eye: Secondary | ICD-10-CM | POA: Diagnosis not present

## 2022-11-18 DIAGNOSIS — H04123 Dry eye syndrome of bilateral lacrimal glands: Secondary | ICD-10-CM | POA: Diagnosis not present

## 2022-11-18 DIAGNOSIS — H35033 Hypertensive retinopathy, bilateral: Secondary | ICD-10-CM | POA: Diagnosis not present

## 2022-11-18 DIAGNOSIS — H40013 Open angle with borderline findings, low risk, bilateral: Secondary | ICD-10-CM | POA: Diagnosis not present

## 2022-11-20 ENCOUNTER — Ambulatory Visit (HOSPITAL_BASED_OUTPATIENT_CLINIC_OR_DEPARTMENT_OTHER): Payer: Medicare Other | Admitting: Certified Registered"

## 2022-11-20 ENCOUNTER — Encounter (HOSPITAL_COMMUNITY): Payer: Self-pay | Admitting: Internal Medicine

## 2022-11-20 ENCOUNTER — Ambulatory Visit (HOSPITAL_COMMUNITY)
Admission: RE | Admit: 2022-11-20 | Discharge: 2022-11-20 | Disposition: A | Discharge status: Home / Self Care | Payer: Medicare Other | Attending: Internal Medicine | Admitting: Internal Medicine

## 2022-11-20 ENCOUNTER — Ambulatory Visit (HOSPITAL_COMMUNITY): Payer: Medicare Other | Admitting: Certified Registered"

## 2022-11-20 ENCOUNTER — Encounter (HOSPITAL_COMMUNITY)
Admission: RE | Disposition: A | Discharge status: Home / Self Care | Payer: Self-pay | Source: Home / Self Care | Attending: Internal Medicine

## 2022-11-20 DIAGNOSIS — Z860101 Personal history of adenomatous and serrated colon polyps: Secondary | ICD-10-CM

## 2022-11-20 DIAGNOSIS — Z86010 Personal history of colonic polyps: Secondary | ICD-10-CM

## 2022-11-20 DIAGNOSIS — K648 Other hemorrhoids: Secondary | ICD-10-CM

## 2022-11-20 DIAGNOSIS — I10 Essential (primary) hypertension: Secondary | ICD-10-CM | POA: Insufficient documentation

## 2022-11-20 DIAGNOSIS — K5730 Diverticulosis of large intestine without perforation or abscess without bleeding: Secondary | ICD-10-CM

## 2022-11-20 DIAGNOSIS — D649 Anemia, unspecified: Secondary | ICD-10-CM

## 2022-11-20 DIAGNOSIS — Z09 Encounter for follow-up examination after completed treatment for conditions other than malignant neoplasm: Secondary | ICD-10-CM | POA: Diagnosis not present

## 2022-11-20 DIAGNOSIS — K625 Hemorrhage of anus and rectum: Secondary | ICD-10-CM

## 2022-11-20 DIAGNOSIS — M5090 Cervical disc disorder, unspecified, unspecified cervical region: Secondary | ICD-10-CM

## 2022-11-20 DIAGNOSIS — I2510 Atherosclerotic heart disease of native coronary artery without angina pectoris: Secondary | ICD-10-CM | POA: Insufficient documentation

## 2022-11-20 DIAGNOSIS — D126 Benign neoplasm of colon, unspecified: Secondary | ICD-10-CM

## 2022-11-20 DIAGNOSIS — D123 Benign neoplasm of transverse colon: Secondary | ICD-10-CM

## 2022-11-20 DIAGNOSIS — D6859 Other primary thrombophilia: Secondary | ICD-10-CM

## 2022-11-20 DIAGNOSIS — Z87891 Personal history of nicotine dependence: Secondary | ICD-10-CM | POA: Diagnosis not present

## 2022-11-20 DIAGNOSIS — R2681 Unsteadiness on feet: Secondary | ICD-10-CM

## 2022-11-20 HISTORY — PX: COLONOSCOPY WITH PROPOFOL: SHX5780

## 2022-11-20 HISTORY — PX: POLYPECTOMY: SHX5525

## 2022-11-20 LAB — POCT I-STAT, CHEM 8
BUN: 12 mg/dL (ref 8–23)
Calcium, Ion: 1.22 mmol/L (ref 1.15–1.40)
Chloride: 114 mmol/L — ABNORMAL HIGH (ref 98–111)
Creatinine, Ser: 1 mg/dL (ref 0.61–1.24)
Glucose, Bld: 104 mg/dL — ABNORMAL HIGH (ref 70–99)
HCT: 44 % (ref 39.0–52.0)
Hemoglobin: 15 g/dL (ref 13.0–17.0)
Potassium: 3.9 mmol/L (ref 3.5–5.1)
Sodium: 144 mmol/L (ref 135–145)
TCO2: 21 mmol/L — ABNORMAL LOW (ref 22–32)

## 2022-11-20 SURGERY — COLONOSCOPY WITH PROPOFOL
Anesthesia: Monitor Anesthesia Care

## 2022-11-20 MED ORDER — PROPOFOL 500 MG/50ML IV EMUL
INTRAVENOUS | Status: DC | PRN
  Administered 2022-11-20: 200 ug/kg/min via INTRAVENOUS

## 2022-11-20 MED ORDER — EPHEDRINE SULFATE-NACL 50-0.9 MG/10ML-% IV SOSY
PREFILLED_SYRINGE | INTRAVENOUS | Status: DC | PRN
  Administered 2022-11-20: 10 mg via INTRAVENOUS

## 2022-11-20 MED ORDER — LACTATED RINGERS IV SOLN: INTRAVENOUS | Status: DC

## 2022-11-20 MED ORDER — LIDOCAINE 2% (20 MG/ML) 5 ML SYRINGE
INTRAMUSCULAR | Status: DC | PRN
  Administered 2022-11-20: 60 mg via INTRAVENOUS

## 2022-11-20 MED ORDER — SODIUM CHLORIDE 0.9 % IV SOLN: INTRAVENOUS | Status: DC

## 2022-11-20 MED ORDER — PROPOFOL 10 MG/ML IV BOLUS
INTRAVENOUS | Status: DC | PRN
  Administered 2022-11-20: 60 mg via INTRAVENOUS

## 2022-11-20 SURGICAL SUPPLY — 22 items

## 2022-11-20 NOTE — Anesthesia Postprocedure Evaluation (Signed)
Anesthesia Post Note  Patient: [name]  Procedure(s) Performed: COLONOSCOPY WITH PROPOFOL POLYPECTOMY     Patient location during evaluation: PACU Anesthesia Type: MAC Level of consciousness: awake Pain management: pain level controlled Vital Signs Assessment: post-procedure vital signs reviewed and stable Respiratory status: spontaneous breathing Cardiovascular status: stable Postop Assessment: no apparent nausea or vomiting Anesthetic complications: no   No notable events documented.  Last Vitals:  Vitals:   11/20/22 1135 11/20/22 1150  BP: 117/67 126/68  Pulse: 62 (!) 54  Resp: 14 11  Temp: (!) 36.3 C (!) 36.4 C  SpO2: 100% 96%    Last Pain:  Vitals:   11/20/22 1150  TempSrc:   PainSc: 0-No pain                 Lauro Manlove

## 2022-11-20 NOTE — H&P (Addendum)
GASTROENTEROLOGY PROCEDURE H&P NOTE   Primary Care Physician: Martinique, Betty G, MD    Reason for Procedure:  History of adenomatous colon polyps  Plan:    Colonoscopy  Patient is appropriate for endoscopic procedure(s) in the outpatient hospital setting.  The nature of the procedure, as well as the risks, benefits, and alternatives were carefully and thoroughly reviewed with the patient. Ample time for discussion and questions allowed. The patient understood, was satisfied, and agreed to proceed.     HPI: [name] is a 76 y.o. male who presents for colonoscopy.  Medical history as below.  Tolerated the prep.  No recent chest pain or shortness of breath.  No abdominal pain today.  See office note dated 09/19/2022 for details and the patient was seen by Vicie Mutters, PA-C.  Warfarin has been on hold x 5 days. Lovenox bridge with last dose yesterday morning.  Past Medical History:  Diagnosis Date   Allergy    Arthritis    CAD (coronary artery disease) 1996   POST CABG   Cataracts, bilateral    Chicken pox    Dyslipidemia    Hyperlipidemia    Hypertension    Sinus bradycardia     Past Surgical History:  Procedure Laterality Date   BYPASS GRAFT  1996   CATARACT EXTRACTION Right    INGUINAL HERNIA REPAIR Right 04/09/2020   Procedure: LAPAROSCOPIC RIGHT INGUINAL HERNIA REPAIR WITH MESH;  Surgeon: Ralene Ok, MD;  Location: [facility];  Service: General;  Laterality: Right;   REFRACTIVE SURGERY Right    SKIN BIOPSY     head x 2   UMBILICAL HERNIA REPAIR  2003    Prior to Admission medications   Medication Sig Start Date End Date Taking? Authorizing Provider  amLODipine (NORVASC) 5 MG tablet Take 1 tablet (5 mg total) by mouth daily. 10/24/22  Yes Martinique, Betty G, MD  cholecalciferol (VITAMIN D) 1000 units tablet Take 1,000 Units by mouth daily.    Yes [provider]  Cyanocobalamin (VITAMIN B12) 500 MCG TABS Take 500 mcg by mouth daily.    Yes  [provider]  donepezil (ARICEPT) 10 MG tablet TAKE 1 TABLET AT BEDTIME 09/01/22  Yes Wertman, Coralee Pesa, PA-C  enoxaparin (LOVENOX) 120 MG/0.8ML injection Inject 0.8 mLs (120 mg total) into the skin daily. 10/27/22  Yes Jettie Booze, MD  ezetimibe (ZETIA) 10 MG tablet Take 1 tablet (10 mg total) by mouth at bedtime. 01/24/22  Yes Jettie Booze, MD  fenofibrate (TRICOR) 145 MG tablet Take 1 tablet (145 mg total) by mouth at bedtime. 01/24/22  Yes Jettie Booze, MD  furosemide (LASIX) 20 MG tablet Take 1 tablet (20 mg total) by mouth daily. May take an extra dose 99m twice a week for swelling 01/24/22  Yes VJettie Booze MD  losartan (COZAAR) 100 MG tablet Take 1 tablet (100 mg total) by mouth daily. 01/24/22  Yes VJettie Booze MD  memantine (NAMENDA) 5 MG tablet Take 1 tablet (5 mg at night) for 2 weeks, then increase to 1 tablet (5 mg) twice a day Patient taking differently: Take 5 mg by mouth 2 (two) times daily. 07/16/22  Yes WRondel Jumbo PA-C  nitroGLYCERIN (NITROSTAT) 0.4 MG SL tablet Place 1 tablet (0.4 mg total) under the tongue every 5 (five) minutes as needed for chest pain. 01/24/22  Yes VJettie Booze MD  Olopatadine HCl (PATADAY OP) Place 1 drop into both eyes daily as needed (  allergies).   Yes [provider]  rosuvastatin (CRESTOR) 40 MG tablet Take 1 tablet (40 mg total) by mouth daily. 01/24/22  Yes Jettie Booze, MD  sodium chloride (OCEAN) 0.65 % SOLN nasal spray Place 1 spray into both nostrils 2 (two) times daily as needed for congestion.   Yes [provider]  traMADol (ULTRAM) 50 MG tablet TAKE 1 TABLET(50 MG) BY MOUTH DAILY AS NEEDED Patient taking differently: Take 50 mg by mouth daily as needed for severe pain or moderate pain. 08/25/22  Yes Martinique, Betty G, MD  warfarin (COUMADIN) 5 MG tablet TAKE AS DIRECTED BY COUMADIN CLINIC Patient taking differently: Take 5-7.5 mg by mouth See admin instructions.  Take 5 mg Mon., Wed.,and Fri., all the other day take 7.5 mg  daily TAKE AS DIRECTED BY COUMADIN CLINIC 08/08/22  Yes Jettie Booze, MD  divalproex (DEPAKOTE) 125 MG DR tablet Take 1 tab at night for 1 week, then increase to 2 tablets at night Patient not taking: Reported on 11/11/2022 10/11/21   Rondel Jumbo, PA-C  PEG-KCl-NaCl-NaSulf-Na Asc-C (PLENVU) 140 g SOLR Take 1 kit by mouth as directed. Use coupon: BIN: [id number] PNC: CNRX Group: [id number] ID: [id number] 09/19/22   Vladimir Crofts, PA-C    No current facility-administered medications for this encounter.    Allergies as of 09/19/2022 - Review Complete 08/28/2022  Allergen Reaction Noted   Other  03/23/2018   Latex  03/21/2020    Family History  Problem Relation Age of Onset   Alzheimer's disease Mother    Cancer Father        prostate   Prostate cancer Father    AAA (abdominal aortic aneurysm) Sister    Hypertension Maternal Grandmother    Hypertension Paternal Grandmother    Colon cancer Neg Hx    Esophageal cancer Neg Hx    Pancreatic cancer Neg Hx    Liver disease Neg Hx     Social History   Socioeconomic History   Marital status: Married    Spouse name: [name]   Number of children: 2   Years of education: COLLEGE   Highest education level: Bachelor's degree (e.g., BA, AB, BS)  Occupational History   Occupation: RETIRED  Tobacco Use   Smoking status: Former    Types: Cigarettes    Quit date: 05/29/1982    Years since quitting: 40.5   Smokeless tobacco: Never  Substance and Sexual Activity   Alcohol use: Yes    Comment: 0-2 drinks a night   Drug use: No   Sexual activity: Not on file  Other Topics Concern   Not on file  Social History Narrative   Right handed   Caffeine   One story home   Social Determinants of Health   Financial Resource Strain: Low Risk  (10/23/2022)   Overall Financial Resource Strain (CARDIA)    Difficulty of Paying Living Expenses: Not very hard  Food Insecurity: No  Food Insecurity (10/23/2022)   Hunger Vital Sign    Worried About Running Out of Food in the Last Year: Never true    Ran Out of Food in the Last Year: Never true  Transportation Needs: No Transportation Needs (10/23/2022)   PRAPARE - Hydrologist (Medical): No    Lack of Transportation (Non-Medical): No  Physical Activity: Inactive (10/23/2022)   Exercise Vital Sign    Days of Exercise per Week: 0 days    Minutes of Exercise per Session: 30  min  Stress: Stress Concern Present (10/23/2022)   [place]    Feeling of Stress : To some extent  Social Connections: Moderately Isolated (10/23/2022)   Social Connection and Isolation Panel [NHANES]    Frequency of Communication with Friends and Family: Twice a week    Frequency of Social Gatherings with Friends and Family: Once a week    Attends Religious Services: Never    Marine scientist or Organizations: No    Attends Archivist Meetings: Never    Marital Status: Married  Human resources officer Violence: Not At Risk (12/23/2021)   Humiliation, Afraid, Rape, and Kick questionnaire    Fear of Current or Ex-Partner: No    Emotionally Abused: No    Physically Abused: No    Sexually Abused: No    Physical Exam: Vital signs in last 24 hours: _0  were no vitals taken for this visit. GEN: NAD EYE: Sclerae anicteric ENT: MMM CV: Non-tachycardic Pulm: CTA b/l GI: Soft, NT/ND NEURO:  Alert & Oriented x 3   Zenovia Jarred, MD [facility]  11/20/2022 10:17 AM

## 2022-11-20 NOTE — Anesthesia Preprocedure Evaluation (Addendum)
Anesthesia Evaluation  Patient identified by MRN, date of birth, ID band  Airway Mallampati: II       Dental   Pulmonary former smoker   breath sounds clear to auscultation       Cardiovascular hypertension, + CAD   Rhythm:Regular Rate:Normal     Neuro/Psych  PSYCHIATRIC DISORDERS         GI/Hepatic Neg liver ROS,,,Hx noted  Dr. Nyoka Cowden   Endo/Other  negative endocrine ROS    Renal/GU negative Renal ROS     Musculoskeletal  (+) Arthritis ,    Abdominal   Peds  Hematology   Anesthesia Other Findings   Reproductive/Obstetrics                             Anesthesia Physical Anesthesia Plan  ASA: 3  Anesthesia Plan: MAC   Post-op Pain Management:    Induction: Intravenous  PONV Risk Score and Plan: Treatment may vary due to age or medical condition and Propofol infusion  Airway Management Planned:   Additional Equipment:   Intra-op Plan:   Post-operative Plan:   Informed Consent: I have reviewed the patients History and Physical, chart, labs and discussed the procedure including the risks, benefits and alternatives for the proposed anesthesia with the patient or authorized representative who has indicated his/her understanding and acceptance.     Dental advisory given  Plan Discussed with: CRNA and Anesthesiologist  Anesthesia Plan Comments:        Anesthesia Quick Evaluation

## 2022-11-20 NOTE — Transfer of Care (Signed)
Immediate Anesthesia Transfer of Care Note  Patient: [name]  Procedure(s) Performed: COLONOSCOPY WITH PROPOFOL POLYPECTOMY  Patient Location: PACU  Anesthesia Type:MAC  Level of Consciousness: awake, alert , and oriented  Airway & Oxygen Therapy: Patient Spontanous Breathing  Post-op Assessment: Report given to RN and Post -op Vital signs reviewed and stable  Post vital signs: Reviewed and stable  Last Vitals:  Vitals Value Taken Time  BP 117/67 11/20/22 1137  Temp 36.3 C 11/20/22 1135  Pulse 59 11/20/22 1139  Resp 12 11/20/22 1139  SpO2 100 % 11/20/22 1139  Vitals shown include unvalidated device data.  Last Pain:  Vitals:   11/20/22 1020  TempSrc: Oral  PainSc: 1          Complications: No notable events documented.

## 2022-11-20 NOTE — Procedures (Addendum)
I spoke to the patient's wife by phone after the procedure.  I updated her on the findings and recommendations.  Time provided for questions and answers and she thanked me for the call.

## 2022-11-20 NOTE — Op Note (Signed)
[facility] Patient [name] Procedure Date : 11/20/2022 MRN: [id number] Attending MD: Jerene Bears , MD, [id number] Date of Birth: 06-15-1946 CSN: [id number] Age: 76 Admit Type: Outpatient Procedure:                Colonoscopy Indications:              High risk colon cancer surveillance: Personal                            history of multiple (3 or more) adenomas, Last                            colonoscopy: July 2020; incidental episode of                            rectal bleeding Providers:                Lajuan Lines. Hilarie Fredrickson, MD, Dulcy Fanny, Janee Morn, Technician Referring MD:             Betty G. Martinique Medicines:                Monitored Anesthesia Care Complications:            No immediate complications. Estimated Blood Loss:     Estimated blood loss: none. Procedure:                Pre-Anesthesia Assessment:                           - Prior to the procedure, a History and Physical                            was performed, and patient medications and                            allergies were reviewed. The patient's tolerance of                            previous anesthesia was also reviewed. The risks                            and benefits of the procedure and the sedation                            options and risks were discussed with the patient.                            All questions were answered, and informed consent                            was obtained. Prior Anticoagulants: The patient has  taken Lovenox (enoxaparin), last dose was 1 day                            prior to procedure. ASA Grade Assessment: III - A                            patient with severe systemic disease. After                            reviewing the risks and benefits, the patient was                            deemed in satisfactory condition to undergo the                            procedure.                            After obtaining informed consent, the colonoscope                            was passed under direct vision. Throughout the                            procedure, the patient's blood pressure, pulse, and                            oxygen saturations were monitored continuously. The                            PCF-190TL ([id number]) Olympus colonoscope was                            introduced through the anus and advanced to the                            cecum, identified by appendiceal orifice and                            ileocecal valve. The colonoscopy was performed                            without difficulty. The patient tolerated the                            procedure well. The quality of the bowel                            preparation was excellent. The ileocecal valve,                            appendiceal orifice, and rectum were photographed. Scope In: 11:12:11 AM Scope Out: 11:28:45 AM Scope Withdrawal Time: 0 hours 11 minutes 48 seconds  Total Procedure Duration: 0 hours 16 minutes 34  seconds  Findings:      The digital rectal exam was normal.      Two sessile polyps were found in the transverse colon. The polyps were 3       to 5 mm in size. These polyps were removed with a cold snare. Resection       and retrieval were complete.      Multiple large-mouthed, medium-mouthed and small-mouthed diverticula       were found in the sigmoid colon, descending colon and ascending colon.      Internal hemorrhoids were found during retroflexion. The hemorrhoids       were medium-sized. Impression:               - Two 3 to 5 mm polyps in the transverse colon,                            removed with a cold snare. Resected and retrieved.                           - Severe diverticulosis in the sigmoid colon and                            mild in the descending colon and in the ascending                            colon.                           - Internal  hemorrhoids. Moderate Sedation:      N/A Recommendation:           - Patient has a contact number available for                            emergencies. The signs and symptoms of potential                            delayed complications were discussed with the                            patient. Return to normal activities tomorrow.                            Written discharge instructions were provided to the                            patient.                           - Resume previous diet.                           - Continue present medications.                           - Resume Coumadin (warfarin) today and Lovenox                            (  enoxaparin) today at prior doses and following                            outline per Coumadin clinic. Refer to managing                            physician for further adjustment of therapy.                           - Await pathology results.                           - No repeat colonoscopy due to age at next                            surveillance interval (> 80 yrs). Procedure Code(s):        --- Professional ---                           [phone], Colonoscopy, flexible; with removal of                            tumor(s), polyp(s), or other lesion(s) by snare                            technique Diagnosis Code(s):        --- Professional ---                           Z86.010, Personal history of colonic polyps                           D12.3, Benign neoplasm of transverse colon (hepatic                            flexure or splenic flexure)                           K64.8, Other hemorrhoids                           K57.30, Diverticulosis of large intestine without                            perforation or abscess without bleeding CPT copyright 2022 [employer]. All rights reserved. The codes documented in this report are preliminary and upon coder review may  be revised to meet current compliance requirements. Jerene Bears, MD 11/20/2022 11:36:47 AM This report has been signed electronically. Number of Addenda: 0

## 2022-11-21 ENCOUNTER — Encounter: Payer: Self-pay | Admitting: Internal Medicine

## 2022-11-21 LAB — SURGICAL PATHOLOGY

## 2022-11-23 ENCOUNTER — Encounter (HOSPITAL_COMMUNITY): Payer: Self-pay | Admitting: Internal Medicine

## 2022-11-25 ENCOUNTER — Encounter: Payer: Medicare Other | Admitting: Physician Assistant

## 2022-11-25 DIAGNOSIS — Z7901 Long term (current) use of anticoagulants: Secondary | ICD-10-CM | POA: Diagnosis not present

## 2022-11-25 DIAGNOSIS — I87332 Chronic venous hypertension (idiopathic) with ulcer and inflammation of left lower extremity: Secondary | ICD-10-CM | POA: Diagnosis not present

## 2022-11-25 DIAGNOSIS — I10 Essential (primary) hypertension: Secondary | ICD-10-CM | POA: Diagnosis not present

## 2022-11-25 DIAGNOSIS — I2510 Atherosclerotic heart disease of native coronary artery without angina pectoris: Secondary | ICD-10-CM | POA: Diagnosis not present

## 2022-11-25 DIAGNOSIS — L97822 Non-pressure chronic ulcer of other part of left lower leg with fat layer exposed: Secondary | ICD-10-CM | POA: Diagnosis not present

## 2022-11-25 DIAGNOSIS — I872 Venous insufficiency (chronic) (peripheral): Secondary | ICD-10-CM | POA: Diagnosis not present

## 2022-11-25 NOTE — Progress Notes (Addendum)
VALDEZ, BRANNAN ([id number]) [id number] Page 1 of 8 Visit Report for 11/25/2022 Arrival Information Details Patient Name: Date of Service: [name], [name] 11/25/2022 10:45 A M Medical Record Number: [id number] Patient Account Number: [id number] Date of Birth/Sex: Treating RN: 15-Apr-1946 (76 y.o. Seward Meth Primary Care Aldwin Micalizzi: [place], Betty Other Clinician: Referring Henli Hey: Treating Asyah Candler/Extender: Stone, Hoyt [place], Betty Weeks in Treatment: 1 Visit Information History Since Last Visit Added or deleted any medications: No Patient Arrived: [name] Any new allergies or adverse reactions: No Arrival Time: 10:43 Had a fall or experienced change in No Accompanied By: wife activities of daily living that may affect Transfer Assistance: None risk of falls: Patient Requires Transmission-Based Precautions: No Hospitalized since last visit: No Patient Has Alerts: Yes Pain Present Now: No Patient Alerts: Patient on Blood Thinner Coumadin on Hold Lovenox injections Electronic Signature(s) Signed: 11/25/2022 11:01:39 AM By: Rosalio Loud MSN RN CNS WTA Entered By: Rosalio Loud on 11/25/2022 11:01:39 -------------------------------------------------------------------------------- Clinic Level of Care Assessment Details Patient Name: Date of Service: [name], [name] 11/25/2022 10:45 A M Medical Record Number: [id number] Patient Account Number: [id number] Date of Birth/Sex: Treating RN: [birth year] (76 y.o. Seward Meth Primary Care Suellyn Meenan: [place], Betty Other Clinician: Referring Cynda Soule: Treating Silvie Obremski/Extender: Stone, Hoyt [place], Betty Weeks in Treatment: 1 Clinic Level of Care Assessment Items TOOL 4 Quantity Score X- 1 0 Use when only an EandM is performed on FOLLOW-UP visit ASSESSMENTS - Nursing Assessment / Reassessment X- 1 10 Reassessment of Co-morbidities (includes updates in patient status) X- 1 5 Reassessment of  Adherence to Treatment Plan ASSESSMENTS - Wound and Skin A ssessment / Reassessment X - Simple Wound Assessment / Reassessment - one wound 1 5 '[]'$  - 0 Complex Wound Assessment / Reassessment - multiple wounds [name], [name] ([id number]) [id number] Page 2 of 8 '[]'$  - 0 Dermatologic / Skin Assessment (not related to wound area) ASSESSMENTS - Focused Assessment '[]'$  - 0 Circumferential Edema Measurements - multi extremities '[]'$  - 0 Nutritional Assessment / Counseling / Intervention '[]'$  - 0 Lower Extremity Assessment (monofilament, tuning fork, pulses) '[]'$  - 0 Peripheral Arterial Disease Assessment (using hand held doppler) ASSESSMENTS - Ostomy and/or Continence Assessment and Care '[]'$  - 0 Incontinence Assessment and Management '[]'$  - 0 Ostomy Care Assessment and Management (repouching, etc.) PROCESS - Coordination of Care X - Simple Patient / Family Education for ongoing care 1 15 '[]'$  - 0 Complex (extensive) Patient / Family Education for ongoing care X- 1 10 Staff obtains Programmer, systems, Records, T Results / Process Orders est '[]'$  - 0 Staff telephones HHA, Nursing Homes / Clarify orders / etc '[]'$  - 0 Routine Transfer to another Facility (non-emergent condition) '[]'$  - 0 Routine Hospital Admission (non-emergent condition) '[]'$  - 0 New Admissions / Biomedical engineer / Ordering NPWT Apligraf, etc. , '[]'$  - 0 Emergency Hospital Admission (emergent condition) X- 1 10 Simple Discharge Coordination '[]'$  - 0 Complex (extensive) Discharge Coordination PROCESS - Special Needs '[]'$  - 0 Pediatric / Minor Patient Management '[]'$  - 0 Isolation Patient Management '[]'$  - 0 Hearing / Language / Visual special needs '[]'$  - 0 Assessment of Community assistance (transportation, D/C planning, etc.) '[]'$  - 0 Additional assistance / Altered mentation '[]'$  - 0 Support Surface(s) Assessment (bed, cushion, seat, etc.) INTERVENTIONS - Wound Cleansing / Measurement X - Simple Wound Cleansing  - one wound 1 5 '[]'$  - 0 Complex Wound Cleansing - multiple wounds X- 1 5 Wound Imaging (photographs - any number of wounds) '[]'$  - 0 Wound  Tracing (instead of photographs) X- 1 5 Simple Wound Measurement - one wound '[]'$  - 0 Complex Wound Measurement - multiple wounds INTERVENTIONS - Wound Dressings X - Small Wound Dressing one or multiple wounds 1 10 '[]'$  - 0 Medium Wound Dressing one or multiple wounds '[]'$  - 0 Large Wound Dressing one or multiple wounds '[]'$  - 0 Application of Medications - topical '[]'$  - 0 Application of Medications - injection INTERVENTIONS - Miscellaneous '[]'$  - 0 External ear exam '[]'$  - 0 Specimen Collection (cultures, biopsies, blood, body fluids, etc.) '[]'$  - 0 Specimen(s) / Culture(s) sent or taken to Lab for analysis ACEA, YAGI ([id number]) [id number] Page 3 of 8 '[]'$  - 0 Patient Transfer (multiple staff / Civil Service fast streamer / Similar devices) '[]'$  - 0 Simple Staple / Suture removal (25 or less) '[]'$  - 0 Complex Staple / Suture removal (26 or more) '[]'$  - 0 Hypo / Hyperglycemic Management (close monitor of Blood Glucose) '[]'$  - 0 Ankle / Brachial Index (ABI) - do not check if billed separately X- 1 5 Vital Signs Has the patient been seen at the hospital within the last three years: Yes Total Score: 85 Level Of Care: New/Established - Level 3 Electronic Signature(s) Signed: 11/25/2022 4:39:03 PM By: Rosalio Loud MSN RN CNS WTA Entered By: Rosalio Loud on 11/25/2022 11:06:59 -------------------------------------------------------------------------------- Encounter Discharge Information Details Patient Name: Date of Service: [name] 11/25/2022 10:45 A M Medical Record Number: [id number] Patient Account Number: [id number] Date of Birth/Sex: Treating RN: Jun 01, 1946 (76 y.o. Seward Meth Primary Care Tyriana Helmkamp: [place], Betty Other Clinician: Referring Rakeen Gaillard: Treating Calven Gilkes/Extender: Stone, Hoyt [place], Betty Weeks in  Treatment: 1 Encounter Discharge Information Items Discharge Condition: Stable Ambulatory Status: Cane Discharge Destination: Home Transportation: Private Auto Accompanied By: wife Schedule Follow-up Appointment: No Clinical Summary of Care: Electronic Signature(s) Signed: 11/25/2022 4:39:03 PM By: Rosalio Loud MSN RN CNS WTA Entered By: Rosalio Loud on 11/25/2022 11:08:45 -------------------------------------------------------------------------------- Lower Extremity Assessment Details Patient Name: Date of Service: [name] 11/25/2022 10:45 A M Medical Record Number: [id number] Patient Account Number: [id number] Date of Birth/Sex: Treating RN: [birth year] (76 y.o. Seward Meth Primary Care Sandro Burgo: [place], Betty Other Clinician: Referring Rose Hippler: Treating Oliviagrace Crisanti/Extender: Stone, Hoyt [place], Betty [place] ([id number]) [id number] Page 4 of 8 Weeks in Treatment: 1 Edema Assessment Assessed: [Left: No] [Right: No] [Left: Edema] [Right: :] Calf Left: Right: Point of Measurement: 38 cm From Medial Instep 40.3 cm Ankle Left: Right: Point of Measurement: 11 cm From Medial Instep 26.2 cm Vascular Assessment Pulses: Dorsalis Pedis Palpable: [Left:Yes] Electronic Signature(s) Signed: 11/25/2022 4:39:03 PM By: Rosalio Loud MSN RN CNS WTA Entered By: Rosalio Loud on 11/25/2022 11:05:30 -------------------------------------------------------------------------------- Multi Wound Chart Details Patient Name: Date of Service: [name] 11/25/2022 10:45 A M Medical Record Number: [id number] Patient Account Number: [id number] Date of Birth/Sex: Treating RN: 1946-01-22 (76 y.o. Seward Meth Primary Care Mandrell Vangilder: [place], Betty Other Clinician: Referring Sharniece Gibbon: Treating Temia Debroux/Extender: Stone, Hoyt [place], Betty Weeks in Treatment: 1 Vital Signs Height(in): 74 Pulse(bpm): 50 Weight(lbs): 194 Blood  Pressure(mmHg): 136/79 Body Mass Index(BMI): 24.9 Temperature(F): 97.7 Respiratory Rate(breaths/min): 16 [1:Photos:] [N/A:N/A] Left, Medial Lower Leg N/A N/A Wound Location: Gradually Appeared N/A N/A Wounding Event: Venous Leg Ulcer N/A N/A Primary Etiology: Congestive Heart Failure, N/A N/A Comorbid History: Hypertension, Dementia 10/20/2022 N/A N/A Date Acquired: 1 N/A N/A Weeks of Treatment: ZIAIR, [name] ([id number]) [id number] Page 5 of 8 Open N/A N/A Wound Status: No N/A N/A Wound Recurrence: 2.7x3.2x0.1 N/A  N/A Measurements L x W x D (cm) 6.786 N/A N/A A (cm) : rea 0.679 N/A N/A Volume (cm) : 45.70% N/A N/A % Reduction in A rea: 45.60% N/A N/A % Reduction in Volume: Partial Thickness N/A N/A Classification: None Present N/A N/A Exudate A mount: Medium (34-66%) N/A N/A Granulation A mount: Red, Pink N/A N/A Granulation Quality: None Present (0%) N/A N/A Necrotic A mount: Fat Layer (Subcutaneous Tissue): Yes N/A N/A Exposed Structures: Fascia: No Tendon: No Muscle: No Joint: No Bone: No Medium (34-66%) N/A N/A Epithelialization: Treatment Notes Electronic Signature(s) Signed: 11/25/2022 4:39:03 PM By: Rosalio Loud MSN RN CNS WTA Entered By: Rosalio Loud on 11/25/2022 11:03:16 -------------------------------------------------------------------------------- New [name] Details Patient Name: Date of Service: [name]. 11/25/2022 10:45 A M Medical Record Number: [id number] Patient Account Number: [id number] Date of Birth/Sex: Treating RN: 06-21-1946 (76 y.o. Seward Meth Primary Care Asahd Can: [place], Betty Other Clinician: Referring Taji Sather: Treating Ailany Koren/Extender: Stone, Hoyt [place], Betty Weeks in Treatment: 1 Active Inactive Electronic Signature(s) Signed: 11/25/2022 4:39:03 PM By: Rosalio Loud MSN RN CNS WTA Entered By: Rosalio Loud on 11/25/2022  11:07:32 -------------------------------------------------------------------------------- Pain Assessment Details Patient Name: Date of Service: [name] 11/25/2022 10:45 A M Medical Record Number: [id number] Patient Account Number: [id number] Date of Birth/Sex: Treating RN: [birth year] (76 y.o. [name], [name], [name] ([id number]) [id number] Page 6 of 8 Primary Care Derrich Gaby: [place], Betty Other Clinician: Referring Conan Mcmanaway: Treating Talicia Sui/Extender: Stone, Hoyt [place], Betty Weeks in Treatment: 1 Active Problems Location of Pain Severity and Description of Pain Patient Has Paino No Site Locations Pain Management and Medication Current Pain Management: Electronic Signature(s) Signed: 11/25/2022 4:39:03 PM By: Rosalio Loud MSN RN CNS WTA Entered By: Rosalio Loud on 11/25/2022 10:52:03 -------------------------------------------------------------------------------- Patient/Caregiver Education Details Patient Name: Date of Service: [name] 12/19/2023andnbsp10:45 [name] Record Number: [id number] Patient Account Number: [id number] Date of Birth/Gender: Treating RN: [birth year] (76 y.o. Seward Meth Primary Care Physician: [place], Betty Other Clinician: Referring Physician: Treating Physician/Extender: Stone, Hoyt [place], Betty Weeks in Treatment: 1 Education Assessment Education Provided To: Patient Education Topics Provided Wound/Skin Impairment: Handouts: Caring for Your Ulcer Methods: Explain/Verbal Responses: State content correctly Electronic Signature(s) Signed: 11/25/2022 4:39:03 PM By: Rosalio Loud MSN RN CNS [address] [name] ([id number]) [id number] Page 7 of 8 Entered By: Rosalio Loud on 11/25/2022 11:07:17 -------------------------------------------------------------------------------- Wound Assessment Details Patient Name: Date of Service: [name], [name]  11/25/2022 10:45 A M Medical Record Number: [id number] Patient Account Number: [id number] Date of Birth/Sex: Treating RN: 1946-04-18 (76 y.o. Seward Meth Primary Care Lebron Nauert: [place], Betty Other Clinician: Referring Reizel Calzada: Treating Chace Klippel/Extender: Stone, Hoyt [place], Betty Weeks in Treatment: 1 Wound Status Wound Number: 1 Primary Etiology: Venous Leg Ulcer Wound Location: Left, Medial Lower Leg Wound Status: Healed - Epithelialized Wounding Event: Gradually Appeared Comorbid History: Congestive Heart Failure, Hypertension, Dementia Date Acquired: 10/20/2022 Weeks Of Treatment: 1 Clustered Wound: No Photos Wound Measurements Length: (cm) Width: (cm) Depth: (cm) Area: (cm) Volume: (cm) 0 % Reduction in Area: 100% 0 % Reduction in Volume: 100% 0 Epithelialization: Medium (34-66%) 0 0 Wound Description Classification: Partial Thickness Exudate Amount: None Present Foul Odor After Cleansing: No Wound Bed Granulation Amount: Medium (34-66%) Exposed Structure Granulation Quality: Red, Pink Fascia Exposed: No Necrotic Amount: None Present (0%) Fat Layer (Subcutaneous Tissue) Exposed: Yes Tendon Exposed: No Muscle Exposed: No Joint Exposed: No Bone Exposed: No Treatment Notes Wound #1 (Lower Leg) Wound Laterality: Left, Medial Cleanser Peri-Wound Care  [name], [name] ([id number]) [id number] Page 8 of 8 Topical Primary Dressing Secondary Dressing Secured With Compression Wrap Compression Stockings Add-Ons Electronic Signature(s) Signed: 11/25/2022 4:39:03 PM By: Rosalio Loud MSN RN CNS WTA Entered By: Rosalio Loud on 11/25/2022 11:05:25 -------------------------------------------------------------------------------- Vitals Details Patient Name: Date of Service: [name] 11/25/2022 10:45 A M Medical Record Number: [id number] Patient Account Number: [id number] Date of Birth/Sex: Treating RN: [birth year] (76  y.o. Seward Meth Primary Care Caeleigh Prohaska: [place], Betty Other Clinician: Referring Coleston Dirosa: Treating Dalanie Kisner/Extender: Stone, Hoyt [place], Betty Weeks in Treatment: 1 Vital Signs Time Taken: 10:46 Temperature (F): 97.7 Height (in): 74 Pulse (bpm): 50 Weight (lbs): 194 Respiratory Rate (breaths/min): 16 Body Mass Index (BMI): 24.9 Blood Pressure (mmHg): 136/79 Reference Range: 80 - 120 mg / dl Electronic Signature(s) Signed: 11/25/2022 4:39:03 PM By: Rosalio Loud MSN RN CNS WTA Entered By: Rosalio Loud on 11/25/2022 10:51:58

## 2022-11-25 NOTE — Progress Notes (Addendum)
[name], [name] ([id number]) [id number] Page 1 of 6 Visit Report for 11/25/2022 Chief Complaint Document Details Patient Name: Date of Service: [name], [name] 11/25/2022 10:45 A M Medical Record Number: [id number] Patient Account Number: [id number] Date of Birth/Sex: Treating RN: 02/13/1946 (76 y.o. Seward Meth Primary Care Provider: Martinique, Betty Other Clinician: Referring Provider: Treating Provider/Extender: Stone, Erman Thum [place], Betty Weeks in Treatment: 1 Information Obtained from: Patient Chief Complaint Left LE ulcer Electronic Signature(s) Signed: 11/25/2022 4:11:06 PM By: Worthy Keeler PA-C Signed: 11/25/2022 4:39:03 PM By: Rosalio Loud MSN RN CNS WTA Previous Signature: 11/25/2022 10:46:28 AM Version By: Worthy Keeler PA-C Entered By: Rosalio Loud on 11/25/2022 11:08:11 -------------------------------------------------------------------------------- HPI Details Patient Name: Date of Service: [name] 11/25/2022 10:45 A M Medical Record Number: [id number] Patient Account Number: [id number] Date of Birth/Sex: Treating RN: 1946/11/12 (76 y.o. Seward Meth Primary Care Provider: Martin[place], Betty Other Clinician: Referring Provider: Treating Provider/Extender: Stone, Quinn Bartling [place], Betty Weeks in Treatment: 1 History of Present Illness HPI Description: 11-17-2022 patient presents for initial inspection here in our clinic today concerning issues has been having with his left leg. He had an area on the medial aspect which has been weeping and draining unfortunately. Fortunately there does not appear to be any signs of active infection systemically which is great news and overall I am extremely pleased in that regard. With that being said he did have an infection at 1 point apparently was prescribed doxycycline and that did get things under control which was great news. He does take Lasix he does not use compression socks on a  regular basis. The doxycycline was given to him on 10-27-2022 as best I can tell and notes reviewed. Patient does have a history of hypertension, coronary artery disease, and because of this he is on long-term anticoagulant therapy currently is on Lovenox. He also has chronic venous insufficiency apparently according to what I see in regard to his legs. 11-25-2022 upon evaluation today patient appears to be doing well currently in regard to his wound which actually appears to be completely healed at this point. This is excellent news. Electronic Signature(s) [name], [name] ([id number]) [id number] Page 2 of 6 Signed: 11/25/2022 11:19:55 AM By: Worthy Keeler PA-C Entered By: Worthy Keeler on 11/25/2022 11:19:54 -------------------------------------------------------------------------------- Physical Exam Details Patient Name: Date of Service: [name], [name] 11/25/2022 10:45 A M Medical Record Number: [id number] Patient Account Number: [id number] Date of Birth/Sex: Treating RN: 1946-05-15 (76 y.o. Seward Meth Primary Care Provider: Martin[place], Betty Other Clinician: Referring Provider: Treating Provider/Extender: Stone, Rajanee Schuelke [place], Betty Weeks in Treatment: 1 Constitutional Well-nourished and well-hydrated in no acute distress. Respiratory normal breathing without difficulty. Psychiatric this patient is able to make decisions and demonstrates good insight into disease process. Alert and Oriented x 3. pleasant and cooperative. Notes Patient's wound showed signs of complete epithelization and overall I am extremely pleased with where we stand today. Fortunately I do not see any signs of infection locally or systemically at this point. Electronic Signature(s) Signed: 11/25/2022 11:20:05 AM By: Worthy Keeler PA-C Entered By: Worthy Keeler on 11/25/2022  11:20:04 -------------------------------------------------------------------------------- Physician Orders Details Patient Name: Date of Service: [name] 11/25/2022 10:45 A M Medical Record Number: [id number] Patient Account Number: [id number] Date of Birth/Sex: Treating RN: [birth year] (76 y.o. 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 Put garments on first thing when you wake up and remove them before bed. Moisturize legs daily after removing compression garments. Elevate, Exercise Daily and A void Standing for Long Periods of Time. DO YOUR BEST to sleep in the bed at night. DO NOT sleep in your recliner. Long hours of sitting in a recliner leads to swelling of the legs and/or potential wounds on your backside. Electronic Signature(s) Signed: 11/25/2022 4:11:06 PM By: Worthy Keeler PA-C Signed: 11/25/2022 4:39:03 PM By: Rosalio Loud MSN RN CNS WTA Entered By: Rosalio Loud on 11/25/2022 11:06:19 -------------------------------------------------------------------------------- Problem List Details Patient Name: Date of Service: [name] 11/25/2022 10:45 A M Medical Record Number: [id number] Patient Account  Number: [id number] Date of Birth/Sex: Treating RN: [birth year] (76 y.o. Seward Meth Primary Care Provider: [place], Betty Other Clinician: Referring Provider: Treating Provider/Extender: Stone, Martise Waddell [place], Betty Weeks in Treatment: 1 Active Problems ICD-10 Encounter Code Description Active Date MDM Diagnosis I87.332 Chronic venous hypertension (idiopathic) with ulcer and inflammation of left 11/17/2022 No Yes lower extremity L97.822 Non-pressure chronic ulcer of other part of left lower leg with fat layer exposed12/10/2022 No Yes I10 Essential (primary) hypertension 11/17/2022 No Yes I25.10 Atherosclerotic heart disease of native coronary artery without angina pectoris 11/17/2022 No Yes Z79.01 Long term (current) use of anticoagulants 11/17/2022 No Yes Inactive Problems Resolved Problems Electronic Signature(s) Signed: 11/25/2022 4:11:06 PM By: Worthy Keeler PA-C Signed: 11/25/2022 4:39:03 PM By: Rosalio Loud MSN RN CNS WTA Previous Signature: 11/25/2022 10:46:25 AM Version By: Lucia Bitter, Leonie Man ([id number]) [id number] Page 4 of 6 Entered By: Rosalio Loud on 11/25/2022 11:08:05 -------------------------------------------------------------------------------- Progress Note Details Patient Name: Date of Service: [name], [name] 11/25/2022 10:45 A M Medical Record Number: [id number] Patient Account Number: [id number] Date of Birth/Sex: Treating RN: [birth year] (76 y.o. Seward Meth Primary Care Provider: [place], Betty Other Clinician: Referring Provider: Treating Provider/Extender: Stone, Briteny Fulghum [place], Betty Weeks in Treatment: 1 Subjective Chief Complaint Information obtained from Patient Left LE ulcer History of Present Illness (HPI) 11-17-2022 patient presents for initial inspection here in our clinic today concerning issues has been having with his left leg. He had an area on the medial aspect which has been  weeping and draining unfortunately. Fortunately there does not appear to be any signs of active infection systemically which is great news and overall I am extremely pleased in that regard. With that being said he did have an infection at 1 point apparently was prescribed doxycycline and that did get things under control which was great news. He does take Lasix he does not use compression socks on a regular basis. The doxycycline was given to him on 10-27-2022 as best I can tell and notes reviewed. Patient does have a history of hypertension, coronary artery disease, and because of this he is on long-term anticoagulant therapy currently is on Lovenox. He also has chronic venous insufficiency apparently according to what I see in regard to his legs. 11-25-2022 upon evaluation today patient appears to be doing well  currently in regard to his wound which actually appears to be completely healed at this point. This is excellent news. Objective Constitutional Well-nourished and well-hydrated in no acute distress. Vitals Time Taken: 10:46 AM, Height: 74 in, Weight: 194 lbs, BMI: 24.9, Temperature: 97.7 F, Pulse: 50 bpm, Respiratory Rate: 16 breaths/min, Blood Pressure: 136/79 mmHg. Respiratory normal breathing without difficulty. Psychiatric this patient is able to make decisions and demonstrates good insight into disease process. Alert and Oriented x 3. pleasant and cooperative. General Notes: Patient's wound showed signs of complete epithelization and overall I am extremely pleased with where we stand today. Fortunately I do not see any signs of infection locally or systemically at this point. Integumentary (Hair, Skin) Wound #1 status is Healed - Epithelialized. Original cause of wound was Gradually Appeared. The date acquired was: 10/20/2022. The wound has been in treatment 1 weeks. The wound is located on the Left,Medial Lower Leg. The wound measures 0cm length x 0cm width x 0cm depth; 0cm^2  area and 0cm^3 volume. There is Fat Layer (Subcutaneous Tissue) exposed. There is a none present amount of drainage noted. There is medium (34-66%) red, pink granulation within the wound bed. There is no necrotic tissue within the wound bed. 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 Put garments on first thing when you wake up and remove them before bed. Moisturize legs daily after removing compression garments. Elevate, Exercise Daily and Avoid Standing for Long Periods of Time. DO YOUR BEST to sleep in the bed at night. DO NOT sleep in your recliner. Long hours of sitting in a recliner leads to swelling of the legs and/or potential wounds on your backside. 1. I am going to recommend that we have the patient go ahead and continue with the recommendation for using compression socks she does have those at home I would recommend that he go and get that going as quickly as possible. 2. I am also can recommend that he should continue to monitor for any signs of infection or worsening. Obviously if anything changes he knows he can contact the office and let me know. Will see the patient back for follow-up visit as needed. Electronic Signature(s) Signed: 11/25/2022 11:21:25 AM By: Worthy Keeler PA-C Entered By: Worthy Keeler on 11/25/2022 11:21:25 -------------------------------------------------------------------------------- SuperBill Details Patient Name: Date of  Service: [name] 11/25/2022 Medical Record Number: [id number] Patient Account Number: [id number] Date of Birth/Sex: Treating RN: Sep 14, 1946 (76 y.o. 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.822 Non-pressure chronic ulcer of other part of left lower leg with fat layer exposed I10 Essential (primary) hypertension I25.10 Atherosclerotic heart disease of native coronary artery without angina pectoris Z79.01 Long term (current) use of anticoagulants Facility Procedures : CPT4 Code: [id number] Description: 94854 - WOUND CARE VISIT-LEV 3 EST PT Modifier: Quantity: 1 Physician Procedures : CPT4 Code Description Modifier [name], [name] ([id number]) [id number] - WC PHYS  LEVEL 3 - EST PT 1 ICD-10 Diagnosis Description I87.332 Chronic venous hypertension (idiopathic) with ulcer and inflammation of  left lower extremity L97.822 Non-pressure chronic ulcer of other part of left lower leg with fat layer exposed I10 Essential (primary) hypertension I25.10 Atherosclerotic heart disease of native coronary artery without angina pectoris Quantity: df Page 6 of 6 Electronic Signature(s) Signed: 11/25/2022 11:21:53 AM By: Worthy Keeler PA-C Entered By: Worthy Keeler on 11/25/2022 11:21:53

## 2022-11-26 ENCOUNTER — Ambulatory Visit: Payer: Medicare Other | Attending: Interventional Cardiology | Admitting: *Deleted

## 2022-11-26 DIAGNOSIS — D6859 Other primary thrombophilia: Secondary | ICD-10-CM | POA: Diagnosis not present

## 2022-11-26 DIAGNOSIS — Z5181 Encounter for therapeutic drug level monitoring: Secondary | ICD-10-CM | POA: Diagnosis not present

## 2022-11-26 LAB — POCT INR: INR: 3 (ref 2.0–3.0)

## 2022-11-26 NOTE — Patient Instructions (Signed)
Description   Continue taking warfarin 1/2 tablet daily except for 1 tablet on Sundays, Tuesday, and Thursday. Recheck INR in 2 weeks. Coumadin Clinic [phone] or [phone]

## 2022-12-06 ENCOUNTER — Emergency Department (HOSPITAL_COMMUNITY)
Admission: EM | Admit: 2022-12-06 | Discharge: 2022-12-06 | Disposition: A | Discharge status: Home / Self Care | Payer: Medicare Other | Attending: Emergency Medicine | Admitting: Emergency Medicine

## 2022-12-06 ENCOUNTER — Encounter (HOSPITAL_COMMUNITY): Payer: Self-pay

## 2022-12-06 DIAGNOSIS — I10 Essential (primary) hypertension: Secondary | ICD-10-CM | POA: Insufficient documentation

## 2022-12-06 DIAGNOSIS — R338 Other retention of urine: Secondary | ICD-10-CM | POA: Insufficient documentation

## 2022-12-06 DIAGNOSIS — Z8679 Personal history of other diseases of the circulatory system: Secondary | ICD-10-CM | POA: Insufficient documentation

## 2022-12-06 DIAGNOSIS — Z7901 Long term (current) use of anticoagulants: Secondary | ICD-10-CM | POA: Insufficient documentation

## 2022-12-06 DIAGNOSIS — Z79899 Other long term (current) drug therapy: Secondary | ICD-10-CM | POA: Insufficient documentation

## 2022-12-06 DIAGNOSIS — Z91040 Latex allergy status: Secondary | ICD-10-CM | POA: Diagnosis not present

## 2022-12-06 DIAGNOSIS — R339 Retention of urine, unspecified: Secondary | ICD-10-CM | POA: Diagnosis not present

## 2022-12-06 LAB — CBC WITH DIFFERENTIAL/PLATELET
Abs Immature Granulocytes: 0.03 10*3/uL (ref 0.00–0.07)
Basophils Absolute: 0 10*3/uL (ref 0.0–0.1)
Basophils Relative: 0 %
Eosinophils Absolute: 0 10*3/uL (ref 0.0–0.5)
Eosinophils Relative: 0 %
HCT: 41 % (ref 39.0–52.0)
Hemoglobin: 13.8 g/dL (ref 13.0–17.0)
Immature Granulocytes: 0 %
Lymphocytes Relative: 3 %
Lymphs Abs: 0.3 10*3/uL — ABNORMAL LOW (ref 0.7–4.0)
MCH: 31.4 pg (ref 26.0–34.0)
MCHC: 33.7 g/dL (ref 30.0–36.0)
MCV: 93.2 fL (ref 80.0–100.0)
Monocytes Absolute: 0.7 10*3/uL (ref 0.1–1.0)
Monocytes Relative: 6 %
Neutro Abs: 10.2 10*3/uL — ABNORMAL HIGH (ref 1.7–7.7)
Neutrophils Relative %: 91 %
Platelets: 238 10*3/uL (ref 150–400)
RBC: 4.4 MIL/uL (ref 4.22–5.81)
RDW: 14 % (ref 11.5–15.5)
WBC: 11.3 10*3/uL — ABNORMAL HIGH (ref 4.0–10.5)
nRBC: 0 % (ref 0.0–0.2)

## 2022-12-06 LAB — URINALYSIS, ROUTINE W REFLEX MICROSCOPIC
Bilirubin Urine: NEGATIVE
Glucose, UA: NEGATIVE mg/dL
Ketones, ur: NEGATIVE mg/dL
Leukocytes,Ua: NEGATIVE
Nitrite: NEGATIVE
Protein, ur: NEGATIVE mg/dL
Specific Gravity, Urine: 1.013 (ref 1.005–1.030)
pH: 7 (ref 5.0–8.0)

## 2022-12-06 LAB — BASIC METABOLIC PANEL
Anion gap: 8 (ref 5–15)
BUN: 14 mg/dL (ref 8–23)
CO2: 23 mmol/L (ref 22–32)
Calcium: 10.3 mg/dL (ref 8.9–10.3)
Chloride: 110 mmol/L (ref 98–111)
Creatinine, Ser: 0.83 mg/dL (ref 0.61–1.24)
GFR, Estimated: >60 mL/min (ref >60)
Glucose, Bld: 110 mg/dL — ABNORMAL HIGH (ref 70–99)
Potassium: 3.7 mmol/L (ref 3.5–5.1)
Sodium: 141 mmol/L (ref 135–145)

## 2022-12-06 MED ORDER — LIDOCAINE HCL URETHRAL/MUCOSAL 2 % EX GEL
1.0000 | Freq: Once | CUTANEOUS | Status: AC
  Administered 2022-12-06: 1 via URETHRAL
  Filled 2022-12-06: qty 11

## 2022-12-06 NOTE — Discharge Instructions (Addendum)
Please follow-up outpatient with urology in clinic within 1 week.  Keep your Foley catheter in place until that time.

## 2022-12-06 NOTE — ED Triage Notes (Signed)
Pt presents with c/o urinary retention. Pt reports that he has been unable to pee since last night.

## 2022-12-06 NOTE — ED Provider Notes (Signed)
[facility] DEPT Provider Note   CSN: [id number] Arrival date & time: 12/06/22  1637     History  Chief Complaint  Patient presents with   Urinary Retention    [name] is a 76 y.o. male.  HPI   76 year old male with medical history significant for CAD, HTN, HLD who presents emergency department with inability urinate.  The patient states that he has been unable to urinate since last night.  He endorses suprapubic pain.  He denies any history of urinary retention or need for Foley catheter placement.  Home Medications Prior to Admission medications   Medication Sig Start Date End Date Taking? Authorizing Provider  amLODipine (NORVASC) 5 MG tablet Take 1 tablet (5 mg total) by mouth daily. 10/24/22   Martinique, Betty G, MD  cholecalciferol (VITAMIN D) 1000 units tablet Take 1,000 Units by mouth daily.     [provider]  Cyanocobalamin (VITAMIN B12) 500 MCG TABS Take 500 mcg by mouth daily.     [provider]  divalproex (DEPAKOTE) 125 MG DR tablet Take 1 tab at night for 1 week, then increase to 2 tablets at night Patient not taking: Reported on 11/11/2022 10/11/21   Rondel Jumbo, PA-C  donepezil (ARICEPT) 10 MG tablet TAKE 1 TABLET AT BEDTIME 09/01/22   Shawn Route, Coralee Pesa, PA-C  enoxaparin (LOVENOX) 120 MG/0.8ML injection Inject 0.8 mLs (120 mg total) into the skin daily. 10/27/22   Jettie Booze, MD  ezetimibe (ZETIA) 10 MG tablet Take 1 tablet (10 mg total) by mouth at bedtime. 01/24/22   Jettie Booze, MD  fenofibrate (TRICOR) 145 MG tablet Take 1 tablet (145 mg total) by mouth at bedtime. 01/24/22   Jettie Booze, MD  furosemide (LASIX) 20 MG tablet Take 1 tablet (20 mg total) by mouth daily. May take an extra dose '20mg'$  twice a week for swelling 01/24/22   Jettie Booze, MD  losartan (COZAAR) 100 MG tablet Take 1 tablet (100 mg total) by mouth daily. 01/24/22   Jettie Booze, MD  memantine  (NAMENDA) 5 MG tablet Take 1 tablet (5 mg at night) for 2 weeks, then increase to 1 tablet (5 mg) twice a day Patient taking differently: Take 5 mg by mouth 2 (two) times daily. 07/16/22   Rondel Jumbo, PA-C  nitroGLYCERIN (NITROSTAT) 0.4 MG SL tablet Place 1 tablet (0.4 mg total) under the tongue every 5 (five) minutes as needed for chest pain. 01/24/22   Jettie Booze, MD  Olopatadine HCl (PATADAY OP) Place 1 drop into both eyes daily as needed (allergies).    [provider]  rosuvastatin (CRESTOR) 40 MG tablet Take 1 tablet (40 mg total) by mouth daily. 01/24/22   Jettie Booze, MD  sodium chloride (OCEAN) 0.65 % SOLN nasal spray Place 1 spray into both nostrils 2 (two) times daily as needed for congestion.    [provider]  traMADol (ULTRAM) 50 MG tablet TAKE 1 TABLET(50 MG) BY MOUTH DAILY AS NEEDED Patient taking differently: Take 50 mg by mouth daily as needed for severe pain or moderate pain. 08/25/22   Martinique, Betty G, MD  warfarin (COUMADIN) 5 MG tablet TAKE AS DIRECTED BY COUMADIN CLINIC Patient taking differently: Take 5-7.5 mg by mouth See admin instructions. Take 5 mg Mon., Wed.,and Fri., all the other day take 7.5 mg  daily TAKE AS DIRECTED BY COUMADIN CLINIC 08/08/22   Jettie Booze, MD  Allergies    Other and Latex    Review of Systems   Review of Systems  All other systems reviewed and are negative.   Physical Exam Updated Vital Signs BP 115/65   Pulse 65   Temp 98.8 F (37.1 C) (Oral)   Resp 16   SpO2 96%  Physical Exam Vitals and nursing note reviewed.  Constitutional:      General: He is not in acute distress.    Appearance: He is well-developed.  HENT:     Head: Normocephalic and atraumatic.  Eyes:     Conjunctiva/sclera: Conjunctivae normal.  Cardiovascular:     Rate and Rhythm: Normal rate and regular rhythm.  Pulmonary:     Effort: Pulmonary effort is normal. No respiratory distress.     Breath sounds: Normal  breath sounds.  Abdominal:     Palpations: Abdomen is soft.     Tenderness: There is abdominal tenderness.     Comments: Suprapubic TTP  Musculoskeletal:        General: No swelling.     Cervical back: Neck supple.  Skin:    General: Skin is warm and dry.     Capillary Refill: Capillary refill takes less than 2 seconds.  Neurological:     Mental Status: He is alert.  Psychiatric:        Mood and Affect: Mood normal.     ED Results / Procedures / Treatments   Labs (all labs ordered are listed, but only abnormal results are displayed) Labs Reviewed  BASIC METABOLIC PANEL - Abnormal; Notable for the following components:      Result Value   Glucose, Bld 110 (*)    All other components within normal limits  CBC WITH DIFFERENTIAL/PLATELET - Abnormal; Notable for the following components:   WBC 11.3 (*)    Neutro Abs 10.2 (*)    Lymphs Abs 0.3 (*)    All other components within normal limits  URINALYSIS, ROUTINE W REFLEX MICROSCOPIC - Abnormal; Notable for the following components:   Hgb urine dipstick SMALL (*)    Bacteria, UA RARE (*)    All other components within normal limits    EKG None  Radiology No results found.  Procedures BLADDER CATHETERIZATION  Date/Time: 12/06/2022 5:38 PM  Performed by: Margarita Mail, PA-C Authorized by: Regan Lemming, MD   Consent:    Consent obtained:  Verbal and emergent situation   Consent given by:  Patient Universal protocol:    Patient identity confirmed:  Verbally with patient and hospital-assigned identification number Pre-procedure details:    Preparation: Patient was prepped and draped in usual sterile fashion   Anesthesia:    Anesthesia method:  Topical application   Topical anesthetic:  Lidocaine gel Procedure details:    Catheter size:  14 Fr   Number of attempts:  1   Urine characteristics:  Yellow Post-procedure details:    Procedure completion:  Tolerated     Medications Ordered in ED Medications   lidocaine (XYLOCAINE) 2 % jelly 1 Application (1 Application Urethral Given 12/06/22 1731)    ED Course/ Medical Decision Making/ A&P                           Medical Decision Making    76 year old male with medical history significant for CAD, HTN, HLD who presents emergency department with inability urinate.  The patient states that he has been unable to urinate since last night.  He endorses  severe suprapubic pain.  He denies any history of urinary retention or need for Foley catheter placement.  On arrival, the patient was vitally stable, physical exam concerning for suprapubic tenderness, no rebound or guarding.  Concern for acute urinary retention.  A Foley catheter was placed with immediate resolution of patient pain and symptoms.  Urinalysis sent without evidence of UTI.  Laboratory evaluation without evidence of AKI . We will have the patient follow-up with urology outpatient.  Patient counseled on need to keep his Foley catheter in place until urology follow-up.   Final Clinical Impression(s) / ED Diagnoses Final diagnoses:  Acute urinary retention    Rx / DC Orders ED Discharge Orders          Ordered    Ambulatory referral to Urology        12/06/22 1740              Regan Lemming, MD 12/06/22 1850

## 2022-12-12 ENCOUNTER — Ambulatory Visit: Payer: Medicare Other | Attending: Cardiology | Admitting: *Deleted

## 2022-12-12 DIAGNOSIS — Z5181 Encounter for therapeutic drug level monitoring: Secondary | ICD-10-CM | POA: Diagnosis not present

## 2022-12-12 DIAGNOSIS — D6859 Other primary thrombophilia: Secondary | ICD-10-CM

## 2022-12-12 LAB — POCT INR: INR: 3.7 — AB (ref 2.0–3.0)

## 2022-12-12 NOTE — Patient Instructions (Addendum)
Description   Do not take any warfarin today then continue taking warfarin 1/2 tablet daily except for 1 tablet on Sundays, Tuesday, and Thursday. Recheck INR in 10 days. Coumadin Clinic [phone] or [phone]

## 2022-12-15 ENCOUNTER — Telehealth (INDEPENDENT_AMBULATORY_CARE_PROVIDER_SITE_OTHER): Payer: Medicare Other | Admitting: Family Medicine

## 2022-12-15 ENCOUNTER — Encounter: Payer: Self-pay | Admitting: Family Medicine

## 2022-12-15 VITALS — BP 141/69 | HR 46 | Ht 74.0 in

## 2022-12-15 DIAGNOSIS — R001 Bradycardia, unspecified: Secondary | ICD-10-CM

## 2022-12-15 DIAGNOSIS — D6859 Other primary thrombophilia: Secondary | ICD-10-CM

## 2022-12-15 DIAGNOSIS — R338 Other retention of urine: Secondary | ICD-10-CM

## 2022-12-15 NOTE — Assessment & Plan Note (Signed)
Asymptomatic. Instructed to continue monitoring HR regularly. Instructed about warning signs.

## 2022-12-15 NOTE — Progress Notes (Signed)
Virtual Visit via Video Note I connected with [name] on 12/15/22 by a video enabled telemedicine application and verified that I am speaking with the correct person using two identifiers. Location patient: home Location provider:work office Persons participating in the virtual visit: patient,wife, provider  I discussed the limitations of evaluation and management by telemedicine and the availability of in person appointments. The patient expressed understanding and agreed to proceed.  Chief Complaint  Patient presents with   Hospitalization Follow-up   HPI: [name] is a 77 yo male with PMHx significant for HTN,generalized OA, alzheimer's disease, HLD, following on recent ED visit. He was evaluated in the ED on 12/06/2022 with acute urinary retention. He reports that the urinary retention was sudden and unexpected, with no prior history of similar issues. He reports increased in urinary frequency and nocturia a couple days prior to episode of retention.  He was feeling pressure gradually building up in his bladder. He denies any new medications or suppments. He has an appointment with urologist on 12/19/2022. Component     Latest Ref Rng 12/06/2022  Appearance     CLEAR  CLEAR   Color, Urine     YELLOW  YELLOW   Specific Gravity, Urine     1.005 - 1.030  1.013   pH     5.0 - 8.0  7.0   Glucose, UA     NEGATIVE mg/dL NEGATIVE   Hgb urine dipstick     NEGATIVE  SMALL !   Bilirubin Urine     NEGATIVE  NEGATIVE   Ketones, ur     NEGATIVE mg/dL NEGATIVE   Protein     NEGATIVE mg/dL NEGATIVE   Nitrite     NEGATIVE  NEGATIVE   Leukocytes,Ua     NEGATIVE  NEGATIVE   RBC / HPF     0 - 5 RBC/hpf 11-20   WBC, UA     0 - 5 WBC/hpf 0-5   Bacteria, UA     NONE SEEN  RARE !   Squamous Epithelial / HPF     0 - 5 /HPF 0-5   Mucus PRESENT     Lab Results  Component Value Date   CREATININE 0.83 12/06/2022   BUN 14 12/06/2022   NA 141 12/06/2022   K 3.7 12/06/2022    CL 110 12/06/2022   CO2 23 12/06/2022   Lab Results  Component Value Date   WBC 11.3 (H) 12/06/2022   HGB 13.8 12/06/2022   HCT 41.0 12/06/2022   MCV 93.2 12/06/2022   PLT 238 12/06/2022   He currently has a Foley catheter in place and reports intermittent pressure in his bladder, usually when cath bends or is kinked/obstructed with certain positions.  He denies unusual fatigue,changes in appetite,fever, fever, abdominal pain, changes in bowel movements, or rectal pain.  He is concerned about the possibility of not being able to urinate on his own after the catheter is removed.   Lab Results  Component Value Date   PSA 0.50 03/27/2017   Today HR has been in the 40's. Hx of sinus bradycardia. He has not noted chest pain, dyspnea, palpitation,or diaphoresis. HTN on Amlodipine 5 mg and Losartan 100 mg daily. He has not been taking Furosemide 20 mg.  Hypercoagulable state on Coumadin. Chronic pain on Tramadol.  ROS: See pertinent positives and negatives per HPI.  Past Medical History:  Diagnosis Date   Allergy    Arthritis    CAD (coronary artery disease)  1996   POST CABG   Cataracts, bilateral    Chicken pox    Dyslipidemia    Hyperlipidemia    Hypertension    Sinus bradycardia    Past Surgical History:  Procedure Laterality Date   BYPASS GRAFT  1996   CATARACT EXTRACTION Right    COLONOSCOPY WITH PROPOFOL N/A 11/20/2022   Procedure: COLONOSCOPY WITH PROPOFOL;  Surgeon: Jerene Bears, MD;  [facility] ENDOSCOPY;  Service: Gastroenterology;  Laterality: N/A;   INGUINAL HERNIA REPAIR Right 04/09/2020   Procedure: LAPAROSCOPIC RIGHT INGUINAL HERNIA REPAIR WITH MESH;  Surgeon: Ralene Ok, MD;  Location: [facility];  Service: General;  Laterality: Right;   POLYPECTOMY  11/20/2022   Procedure: POLYPECTOMY;  Surgeon: Jerene Bears, MD;  Location: [facility] ENDOSCOPY;  Service: Gastroenterology;;   REFRACTIVE SURGERY Right    SKIN BIOPSY     head x 2   UMBILICAL HERNIA  REPAIR  2003    Family History  Problem Relation Age of Onset   Alzheimer's disease Mother    Cancer Father        prostate   Prostate cancer Father    AAA (abdominal aortic aneurysm) Sister    Hypertension Maternal Grandmother    Hypertension Paternal Grandmother    Colon cancer Neg Hx    Esophageal cancer Neg Hx    Pancreatic cancer Neg Hx    Liver disease Neg Hx     Social History   Socioeconomic History   Marital status: Married    Spouse name: [name]   Number of children: 2   Years of education: COLLEGE   Highest education level: Bachelor's degree (e.g., BA, AB, BS)  Occupational History   Occupation: RETIRED  Tobacco Use   Smoking status: Former    Types: Cigarettes    Quit date: 05/29/1982    Years since quitting: 40.5   Smokeless tobacco: Never  Substance and Sexual Activity   Alcohol use: Yes    Comment: 0-2 drinks a night   Drug use: No   Sexual activity: Not on file  Other Topics Concern   Not on file  Social History Narrative   Right handed   Caffeine   One story home   Social Determinants of Health   Financial Resource Strain: Low Risk  (10/23/2022)   Overall Financial Resource Strain (CARDIA)    Difficulty of Paying Living Expenses: Not very hard  Food Insecurity: No Food Insecurity (10/23/2022)   Hunger Vital Sign    Worried About Running Out of Food in the Last Year: Never true    Ran Out of Food in the Last Year: Never true  Transportation Needs: No Transportation Needs (10/23/2022)   PRAPARE - Hydrologist (Medical): No    Lack of Transportation (Non-Medical): No  Physical Activity: Inactive (10/23/2022)   Exercise Vital Sign    Days of Exercise per Week: 0 days    Minutes of Exercise per Session: 30 min  Stress: Stress Concern Present (10/23/2022)   [place]    Feeling of Stress : To some extent  Social Connections: Moderately Isolated  (10/23/2022)   Social Connection and Isolation Panel [NHANES]    Frequency of Communication with Friends and Family: Twice a week    Frequency of Social Gatherings with Friends and Family: Once a week    Attends Religious Services: Never    Marine scientist or Organizations: No  Attends Archivist Meetings: Never    Marital Status: Married  Human resources officer Violence: Not At Risk (12/23/2021)   Humiliation, Afraid, Rape, and Kick questionnaire    Fear of Current or Ex-Partner: No    Emotionally Abused: No    Physically Abused: No    Sexually Abused: No    Current Outpatient Medications:    amLODipine (NORVASC) 5 MG tablet, Take 1 tablet (5 mg total) by mouth daily., Disp: 135 tablet, Rfl: 3   cholecalciferol (VITAMIN D) 1000 units tablet, Take 1,000 Units by mouth daily. , Disp: , Rfl:    Cyanocobalamin (VITAMIN B12) 500 MCG TABS, Take 500 mcg by mouth daily. , Disp: , Rfl:    divalproex (DEPAKOTE) 125 MG DR tablet, Take 1 tab at night for 1 week, then increase to 2 tablets at night, Disp: 180 tablet, Rfl: 3   donepezil (ARICEPT) 10 MG tablet, TAKE 1 TABLET AT BEDTIME, Disp: 90 tablet, Rfl: 3   ezetimibe (ZETIA) 10 MG tablet, Take 1 tablet (10 mg total) by mouth at bedtime., Disp: 90 tablet, Rfl: 3   fenofibrate (TRICOR) 145 MG tablet, Take 1 tablet (145 mg total) by mouth at bedtime., Disp: 90 tablet, Rfl: 3   furosemide (LASIX) 20 MG tablet, Take 1 tablet (20 mg total) by mouth daily. May take an extra dose '20mg'$  twice a week for swelling, Disp: 120 tablet, Rfl: 3   losartan (COZAAR) 100 MG tablet, Take 1 tablet (100 mg total) by mouth daily., Disp: 90 tablet, Rfl: 3   memantine (NAMENDA) 5 MG tablet, Take 1 tablet (5 mg at night) for 2 weeks, then increase to 1 tablet (5 mg) twice a day (Patient taking differently: Take 5 mg by mouth 2 (two) times daily.), Disp: 60 tablet, Rfl: 11   nitroGLYCERIN (NITROSTAT) 0.4 MG SL tablet, Place 1 tablet (0.4 mg total) under the tongue  every 5 (five) minutes as needed for chest pain., Disp: 75 tablet, Rfl: 2   Olopatadine HCl (PATADAY OP), Place 1 drop into both eyes daily as needed (allergies)., Disp: , Rfl:    rosuvastatin (CRESTOR) 40 MG tablet, Take 1 tablet (40 mg total) by mouth daily., Disp: 90 tablet, Rfl: 3   sodium chloride (OCEAN) 0.65 % SOLN nasal spray, Place 1 spray into both nostrils 2 (two) times daily as needed for congestion., Disp: , Rfl:    traMADol (ULTRAM) 50 MG tablet, TAKE 1 TABLET(50 MG) BY MOUTH DAILY AS NEEDED (Patient taking differently: Take 50 mg by mouth daily as needed for severe pain or moderate pain.), Disp: 30 tablet, Rfl: 0   warfarin (COUMADIN) 5 MG tablet, TAKE AS DIRECTED BY COUMADIN CLINIC (Patient taking differently: Take 5-7.5 mg by mouth See admin instructions. Take 5 mg Mon., Wed.,and Fri., all the other day take 7.5 mg  daily TAKE AS DIRECTED BY COUMADIN CLINIC), Disp: 80 tablet, Rfl: 3  EXAM:  VITALS per patient if applicable:BP (!) 099/83   Pulse (!) 46   Ht '6\' 2"'$  (1.88 m)   BMI 24.65 kg/m   GENERAL: alert, oriented, appears well and in no acute distress  HEENT: atraumatic, conjunctiva clear, no obvious abnormalities on inspection.  NECK: normal movements of the head and neck  LUNGS: on inspection no signs of respiratory distress, breathing rate appears normal, no obvious gross SOB, gasping or wheezing  CV: no obvious cyanosis  MS: moves all visible extremities without noticeable abnormality  PSYCH/NEURO: pleasant and cooperative, no obvious depression or anxiety, speech and  thought processing grossly intact  ASSESSMENT AND PLAN:  Discussed the following assessment and plan:  Acute urinary retention Assessment & Plan: We discussed possible etiologies. Acute prostatitis is to be considered in the differential Dx. No symptoms that suggest a serious infectious process and feeling better after foley cath placement. We discussed options at this time, including  prophylactic abx treatment until he sees urologist or holding on abx until he sees urologist. We also reviewed abx side effects and the risk of interaction with coumadin. He prefers to hold on abx for now. Monitor for new symptoms. Has appt with urologist on 12/19/22.   Sinus bradycardia Assessment & Plan: Asymptomatic. Instructed to continue monitoring HR regularly. Instructed about warning signs.   Primary hypercoagulable state [facility]) Assessment & Plan: On chronic anticoagulation, coumadin. Follows with cardiologist/coumadin clinic.   I spent a total of 32 minutes in both face to face and non face to face activities for this visit on the date of this encounter. During this time history was obtained and documented, examination was performed, prior labs reviewed, and assessment/plan discussed.  We discussed possible serious and likely etiologies, options for evaluation and workup, limitations of telemedicine visit vs in person visit, treatment, treatment risks and precautions. The patient was advised to call back or seek an in-person evaluation if the symptoms worsen or if the condition fails to improve as anticipated. I discussed the assessment and treatment plan with the patient. The patient was provided an opportunity to ask questions and all were answered. The patient agreed with the plan and demonstrated an understanding of the instructions.  Return if symptoms worsen or fail to improve, for keep next appointment.  Claira Jeter G. [place], MD  [facility]. [place] office.

## 2022-12-15 NOTE — Assessment & Plan Note (Signed)
We discussed possible etiologies. Acute prostatitis is to be considered in the differential Dx. No symptoms that suggest a serious infectious process and feeling better after foley cath placement. We discussed options at this time, including prophylactic abx treatment until he sees urologist or holding on abx until he sees urologist. We also reviewed abx side effects and the risk of interaction with coumadin. He prefers to hold on abx for now. Monitor for new symptoms. Has appt with urologist on 12/19/22.

## 2022-12-15 NOTE — Assessment & Plan Note (Addendum)
On chronic anticoagulation, coumadin. Follows with cardiologist/coumadin clinic.

## 2022-12-19 DIAGNOSIS — R338 Other retention of urine: Secondary | ICD-10-CM | POA: Diagnosis not present

## 2022-12-22 ENCOUNTER — Ambulatory Visit: Payer: Medicare Other | Attending: Cardiology

## 2022-12-22 DIAGNOSIS — Z5181 Encounter for therapeutic drug level monitoring: Secondary | ICD-10-CM | POA: Diagnosis not present

## 2022-12-22 DIAGNOSIS — D6859 Other primary thrombophilia: Secondary | ICD-10-CM | POA: Diagnosis not present

## 2022-12-22 LAB — POCT INR: INR: 2.5 (ref 2.0–3.0)

## 2022-12-22 NOTE — Patient Instructions (Signed)
continue taking warfarin 1/2 tablet daily except for 1 tablet on Sundays, Tuesday, and Thursday. Recheck INR in 3 weeks. Coumadin Clinic [phone] or [phone]

## 2022-12-24 ENCOUNTER — Ambulatory Visit (INDEPENDENT_AMBULATORY_CARE_PROVIDER_SITE_OTHER): Payer: Medicare Other

## 2022-12-24 VITALS — BP 122/60 | HR 67 | Temp 98.4°F | Ht 73.0 in | Wt 189.7 lb

## 2022-12-24 DIAGNOSIS — Z0000 Encounter for general adult medical examination without abnormal findings: Secondary | ICD-10-CM

## 2022-12-24 NOTE — Progress Notes (Cosign Needed Addendum)
Subjective:   [name] is a 77 y.o. male who presents for Medicare Annual/Subsequent preventive examination.  Review of Systems      Cardiac Risk Factors include: advanced age (>14mn, >>13women);hypertension;male gender     Objective:    Today's Vitals   12/24/22 1314 12/24/22 1317  BP: 122/60   Pulse: 67   Temp: 98.4 F (36.9 C)   TempSrc: Oral   SpO2: 97%   Weight: 189 lb 11.2 oz (86 kg)   Height: '6\' 1"'$  (1.854 m)   PainSc:  0-No pain   Body mass index is 25.03 kg/m.     12/24/2022    1:33 PM 12/06/2022    5:14 PM 11/20/2022   10:18 AM 08/28/2022    9:39 AM 07/16/2022    9:23 AM 01/13/2022   11:20 AM 12/23/2021    1:22 PM  Advanced Directives  Does Patient Have a Medical Advance Directive? Yes No Yes Yes Yes Yes Yes  Type of AParamedicof [place]antryLiving will   [place] will   [place] will  Does patient want to make changes to medical advance directive?       No - Patient declined  Copy of [place] Chart? No - copy requested   No - copy requested   No - copy requested  Would patient like information on creating a medical advance directive?  No - Patient declined         Current Medications (verified) Outpatient Encounter Medications as of 12/24/2022  Medication Sig   tamsulosin (FLOMAX) 0.4 MG CAPS capsule Take 0.4 mg by mouth daily after supper.   amLODipine (NORVASC) 5 MG tablet Take 1 tablet (5 mg total) by mouth daily.   cholecalciferol (VITAMIN D) 1000 units tablet Take 1,000 Units by mouth daily.    Cyanocobalamin (VITAMIN B12) 500 MCG TABS Take 500 mcg by mouth daily.    divalproex (DEPAKOTE) 125 MG DR tablet Take 1 tab at night for 1 week, then increase to 2 tablets at night   donepezil (ARICEPT) 10 MG tablet TAKE 1 TABLET AT BEDTIME   ezetimibe (ZETIA) 10 MG tablet Take 1 tablet (10 mg total) by mouth at bedtime.   fenofibrate (TRICOR) 145 MG  tablet Take 1 tablet (145 mg total) by mouth at bedtime.   furosemide (LASIX) 20 MG tablet Take 1 tablet (20 mg total) by mouth daily. May take an extra dose '20mg'$  twice a week for swelling   losartan (COZAAR) 100 MG tablet Take 1 tablet (100 mg total) by mouth daily.   memantine (NAMENDA) 5 MG tablet Take 1 tablet (5 mg at night) for 2 weeks, then increase to 1 tablet (5 mg) twice a day (Patient taking differently: Take 5 mg by mouth 2 (two) times daily.)   nitroGLYCERIN (NITROSTAT) 0.4 MG SL tablet Place 1 tablet (0.4 mg total) under the tongue every 5 (five) minutes as needed for chest pain.   Olopatadine HCl (PATADAY OP) Place 1 drop into both eyes daily as needed (allergies).   rosuvastatin (CRESTOR) 40 MG tablet Take 1 tablet (40 mg total) by mouth daily.   sodium chloride (OCEAN) 0.65 % SOLN nasal spray Place 1 spray into both nostrils 2 (two) times daily as needed for congestion.   traMADol (ULTRAM) 50 MG tablet TAKE 1 TABLET(50 MG) BY MOUTH DAILY AS NEEDED (Patient taking differently: Take 50 mg by mouth daily as needed for severe pain  or moderate pain.)   warfarin (COUMADIN) 5 MG tablet TAKE AS DIRECTED BY COUMADIN CLINIC (Patient taking differently: Take 5-7.5 mg by mouth See admin instructions. Take 5 mg Mon., Wed.,and Fri., all the other day take 7.5 mg  daily TAKE AS DIRECTED BY COUMADIN CLINIC)   No facility-administered encounter medications on file as of 12/24/2022.    Allergies (verified) Other and Latex   History: Past Medical History:  Diagnosis Date   Allergy    Arthritis    CAD (coronary artery disease) 1996   POST CABG   Cataracts, bilateral    Chicken pox    Dyslipidemia    Hyperlipidemia    Hypertension    Sinus bradycardia    Past Surgical History:  Procedure Laterality Date   BYPASS GRAFT  1996   CATARACT EXTRACTION Right    COLONOSCOPY WITH PROPOFOL N/A 11/20/2022   Procedure: COLONOSCOPY WITH PROPOFOL;  Surgeon: Jerene Bears, MD;  Location: [facility];  Service: Gastroenterology;  Laterality: N/A;   INGUINAL HERNIA REPAIR Right 04/09/2020   Procedure: LAPAROSCOPIC RIGHT INGUINAL HERNIA REPAIR WITH MESH;  Surgeon: Ralene Ok, MD;  Location: [facility];  Service: General;  Laterality: Right;   POLYPECTOMY  11/20/2022   Procedure: POLYPECTOMY;  Surgeon: Jerene Bears, MD;  Location: [facility];  Service: Gastroenterology;;   REFRACTIVE SURGERY Right    SKIN BIOPSY     head x 2   UMBILICAL HERNIA REPAIR  2003   Family History  Problem Relation Age of Onset   Alzheimer's disease Mother    Cancer Father        prostate   Prostate cancer Father    AAA (abdominal aortic aneurysm) Sister    Hypertension Maternal Grandmother    Hypertension Paternal Grandmother    Colon cancer Neg Hx    Esophageal cancer Neg Hx    Pancreatic cancer Neg Hx    Liver disease Neg Hx    Social History   Socioeconomic History   Marital status: Married    Spouse name: [name]   Number of children: 2   Years of education: COLLEGE   Highest education level: Bachelor's degree (e.g., BA, AB, BS)  Occupational History   Occupation: RETIRED  Tobacco Use   Smoking status: Former    Types: Cigarettes    Quit date: 05/29/1982    Years since quitting: 40.6   Smokeless tobacco: Never  Substance and Sexual Activity   Alcohol use: Yes    Comment: 0-2 drinks a night   Drug use: No   Sexual activity: Not on file  Other Topics Concern   Not on file  Social History Narrative   Right handed   Caffeine   One story home   Social Determinants of Health   Financial Resource Strain: Low Risk  (12/24/2022)   Overall Financial Resource Strain (CARDIA)    Difficulty of Paying Living Expenses: Not hard at all  Food Insecurity: No Food Insecurity (12/24/2022)   Hunger Vital Sign    Worried About Running Out of Food in the Last Year: Never true    Ran Out of Food in the Last Year: Never true  Transportation Needs: No Transportation Needs (12/24/2022)    PRAPARE - Hydrologist (Medical): No    Lack of Transportation (Non-Medical): No  Physical Activity: Inactive (12/24/2022)   Exercise Vital Sign    Days of Exercise per Week: 0 days    Minutes of Exercise per Session: 0  min  Stress: No Stress Concern Present (12/24/2022)   [place]    Feeling of Stress : Not at all  Recent Concern: Stress - Stress Concern Present (10/23/2022)   [place]    Feeling of Stress : To some extent  Social Connections: Moderately Isolated (12/24/2022)   Social Connection and Isolation Panel [NHANES]    Frequency of Communication with Friends and Family: More than three times a week    Frequency of Social Gatherings with Friends and Family: More than three times a week    Attends Religious Services: Never    Marine scientist or Organizations: No    Attends Music therapist: Never    Marital Status: Married    Tobacco Counseling Counseling given: Not Answered   Clinical Intake:  Pre-visit preparation completed: Yes  Pain : No/denies pain Pain Score: 0-No pain     BMI - recorded: 25.03 Nutritional Status: BMI 25 -29 Overweight Nutritional Risks: None Diabetes: No  How often do you need to have someone help you when you read instructions, pamphlets, or other written materials from your doctor or pharmacy?: 1 - Never  Diabetic? No  Interpreter Needed?: No  Information entered by :: Rolene Arbour LPN   Activities of Daily Living    12/24/2022    1:27 PM 12/23/2022    9:18 PM  In your present state of health, do you have any difficulty performing the following activities:  Hearing? 0 0  Vision? 0 0  Difficulty concentrating or making decisions? 0 1  Walking or climbing stairs? 0 1  Dressing or bathing? 0 0  Doing errands, shopping? 0 0  Preparing Food and  eating ? N N  Using the Toilet? N N  In the past six months, have you accidently leaked urine? N Y  Comment Wears depends, Followed by Urologist   Do you have problems with loss of bowel control? N N  Managing your Medications? N N  Managing your Finances? N N  Housekeeping or managing your Housekeeping? N N    Patient Care Team: [place], Betty G, MD as PCP - General (Family Medicine) Jettie Booze, MD as PCP - Cardiology (Cardiology)  Indicate any recent Medical Services you may have received from other than [facility] providers in the past year (date may be approximate).     Assessment:   This is a routine wellness examination for Ryo.  Hearing/Vision screen Hearing Screening - Comments:: Denies hearing difficulties   Vision Screening - Comments:: Wears rx glasses - up to date with routine eye exams with  Dr Doren Custard  Dietary issues and exercise activities discussed: Exercise limited by: None identified   Goals Addressed               This Visit's Progress     Patient stated (pt-stated)        I want to continue to cook and freeze our food and share with family and friends.       Depression Screen    10/24/2022    2:03 PM 12/23/2021    1:13 PM 12/23/2021    1:11 PM 11/05/2021    5:13 PM 11/26/2020    3:14 PM 08/28/2020    5:23 PM 03/29/2019    9:54 AM  PHQ 2/9 Scores  PHQ - 2 Score 0 0 0 0 0 0 0  PHQ- 9 Score  0      Fall Risk    12/24/2022    1:30 PM 12/23/2022    9:18 PM 10/24/2022    2:03 PM 10/23/2022    6:19 PM 07/16/2022    9:23 AM  Fall Risk   Falls in the past year? 0 0 0 0 0  Number falls in past yr: 0  0  0  Injury with Fall? 0  0  0  Risk for fall due to : No Fall Risks  Other (Comment)    Follow up Falls prevention discussed  Falls evaluation completed      [place]:  Any stairs in or around the home? Yes  If so, are there any without handrails? No  Home free of loose throw rugs in walkways, pet  beds, electrical cords, etc? Yes  Adequate lighting in your home to reduce risk of falls? Yes   ASSISTIVE DEVICES UTILIZED TO PREVENT FALLS:  Life alert? No  Use of a cane, walker or w/c? Yes  Grab bars in the bathroom? Yes  Shower chair or bench in shower? Yes  Elevated toilet seat or a handicapped toilet? Yes   TIMED UP AND GO:  Was the test performed? Yes .  Length of time to ambulate 10 feet: 10 sec.   Gait slow and steady with assistive device  Cognitive Function:    07/17/2022   12:00 PM  MMSE - Mini Mental State Exam  Orientation to time 0  Orientation to Place 4  Registration 3  Attention/ Calculation 0  Recall 2  Language- name 2 objects 2  Language- repeat 1  Language- follow 3 step command 3  Language- read & follow direction 1  Write a sentence 0  Copy design 0  Total score 16      10/11/2021    1:00 PM  Montreal Cognitive Assessment   Visuospatial/ Executive (0/5) 0  Naming (0/3) 2  Attention: Read list of digits (0/2) 2  Attention: Read list of letters (0/1) 1  Attention: Serial 7 subtraction starting at 100 (0/3) 2  Language: Repeat phrase (0/2) 0  Language : Fluency (0/1) 1  Abstraction (0/2) 2  Delayed Recall (0/5) 2  Orientation (0/6) 5  Total 17  Adjusted Score (based on education) 17      12/24/2022    1:33 PM 12/23/2021    1:19 PM  6CIT Screen  What Year? 0 points 0 points  What month? 0 points 0 points  What time? 0 points 0 points  Count back from 20 0 points 0 points  Months in reverse 2 points 2 points  Repeat phrase 0 points 0 points  Total Score 2 points 2 points    Immunizations Immunization History  Administered Date(s) Administered   Fluad Quad(high Dose 65+) 11/05/2021, 10/24/2022   Influenza,inj,Quad PF,6+ Mos 10/16/2017, 09/20/2018, 09/06/2019, 11/26/2020   PFIZER Comirnaty(Gray Top)Covid-19 Tri-Sucrose Vaccine 05/14/2021   PFIZER(Purple Top)SARS-COV-2 Vaccination 01/15/2020, 02/09/2020, 09/22/2020   Pfizer  Covid-19 Vaccine Bivalent Booster 54yr & up 10/22/2021   Pneumococcal Conjugate-13 11/15/2015   Pneumococcal-Unspecified 11/08/2014    TDAP status: Due, Education has been provided regarding the importance of this vaccine. Advised may receive this vaccine at local pharmacy or Health Dept. Aware to provide a copy of the vaccination record if obtained from local pharmacy or Health Dept. Verbalized acceptance and understanding.      Covid-19 vaccine status: Completed vaccines  Qualifies for Shingles Vaccine? Yes   Zostavax  completed No   Shingrix Completed?: No.    Education has been provided regarding the importance of this vaccine. Patient has been advised to call insurance company to determine out of pocket expense if they have not yet received this vaccine. Advised may also receive vaccine at local pharmacy or Health Dept. Verbalized acceptance and understanding.  Screening Tests Health Maintenance  Topic Date Due   DTaP/Tdap/Td (1 - Tdap) Never done   COVID-19 Vaccine (6 - 2023-24 season) 01/09/2023 (Originally 08/08/2022)   Zoster Vaccines- Shingrix (1 of 2) 03/25/2023 (Originally 10/31/1997)   Medicare Annual Wellness ([place])  12/25/2023   COLONOSCOPY (Pts 45-8yr Insurance coverage will need to be confirmed)  11/20/2025   INFLUENZA VACCINE  Completed   Hepatitis C Screening  Completed   HPV VACCINES  Aged Out   Pneumonia Vaccine 77 Years old  Discontinued    Health Maintenance  Health Maintenance Due  Topic Date Due   DTaP/Tdap/Td (1 - Tdap) Never done    Colorectal cancer screening: Type of screening: Colonoscopy. Completed 11/20/22. Repeat every 3 years  Lung Cancer Screening: (Low Dose CT Chest recommended if Age 76-80years, 30 pack-year currently smoking OR have quit w/in 15years.) does not qualify.      Additional Screening:  Hepatitis C Screening: does qualify; Completed 03/29/18  Vision Screening: Recommended annual ophthalmology exams for early detection  of glaucoma and other disorders of the eye. Is the patient up to date with their annual eye exam?  Yes  Who is the provider or what is the name of the office in which the patient attends annual eye exams? Dr FDoren CustardIf pt is not established with a provider, would they like to be referred to a provider to establish care? No .   Dental Screening: Recommended annual dental exams for proper oral hygiene  Community Resource Referral / Chronic Care Management:  CRR required this visit?  No   CCM required this visit?  No      Plan:     I have personally reviewed and noted the following in the patient's chart:   Medical and social history Use of alcohol, tobacco or illicit drugs  Current medications and supplements including opioid prescriptions. Patient currently taking Opioids Functional ability and status Nutritional status Physical activity Advanced directives List of other physicians Hospitalizations, surgeries, and ER visits in previous 12 months Vitals Screenings to include cognitive, depression, and falls Referrals and appointments  In addition, I have reviewed and discussed with patient certain preventive protocols, quality metrics, and best practice recommendations. A written personalized care plan for preventive services as well as general preventive health recommendations were provided to patient.     BCriselda Peaches LPN   14/25/9563  Nurse Notes: None

## 2022-12-24 NOTE — Patient Instructions (Addendum)
[name] , Thank you for taking time to come for your Medicare Wellness Visit. I appreciate your ongoing commitment to your health goals. Please review the following plan we discussed and let me know if I can assist you in the future.   These are the goals we discussed:  Goals       Exercise 3x per week (30 min per time)      Patient Stated      Get back on the walker to walk more  Can walk in the neighborhood.       Patient stated (pt-stated)      I want to continue to cook and freeze our food and share with family and friends.        This is a list of the screening recommended for you and due dates:  Health Maintenance  Topic Date Due   DTaP/Tdap/Td vaccine (1 - Tdap) Never done   COVID-19 Vaccine (6 - 2023-24 season) 01/09/2023*   Zoster (Shingles) Vaccine (1 of 2) 03/25/2023*   Medicare Annual Wellness Visit  12/25/2023   Colon Cancer Screening  11/20/2025   Flu Shot  Completed   Hepatitis C Screening: USPSTF Recommendation to screen - Ages 18-79 yo.  Completed   HPV Vaccine  Aged Out   Pneumonia Vaccine  Discontinued  *Topic was postponed. The date shown is not the original due date.   Opioid Pain Medicine Management Opioids are powerful medicines that are used to treat moderate to severe pain. When used for short periods of time, they can help you to: Sleep better. Do better in physical or occupational therapy. Feel better in the first few days after an injury. Recover from surgery. Opioids should be taken with the supervision of a trained health care provider. They should be taken for the shortest period of time possible. This is because opioids can be addictive, and the longer you take opioids, the greater your risk of addiction. This addiction can also be called opioid use disorder. What are the risks? Using opioid pain medicines for longer than 3 days increases your risk of side effects. Side effects include: Constipation. Nausea and vomiting. Breathing  difficulties (respiratory depression). Drowsiness. Confusion. Opioid use disorder. Itching. Taking opioid pain medicine for a long period of time can affect your ability to do daily tasks. It also puts you at risk for: Motor vehicle crashes. Depression. Suicide. Heart attack. Overdose, which can be life-threatening. What is a pain treatment plan? A pain treatment plan is an agreement between you and your health care provider. Pain is unique to each person, and treatments vary depending on your condition. To manage your pain, you and your health care provider need to work together. To help you do this: Discuss the goals of your treatment, including how much pain you might expect to have and how you will manage the pain. Review the risks and benefits of taking opioid medicines. Remember that a good treatment plan uses more than one approach and minimizes the chance of side effects. Be honest about the amount of medicines you take and about any drug or alcohol use. Get pain medicine prescriptions from only one health care provider. Pain can be managed with many types of alternative treatments. Ask your health care provider to refer you to one or more specialists who can help you manage pain through: Physical or occupational therapy. Counseling (cognitive behavioral therapy). Good nutrition. Biofeedback. Massage. Meditation. Non-opioid medicine. Following a gentle exercise program. How to use opioid pain  medicine Taking medicine Take your pain medicine exactly as told by your health care provider. Take it only when you need it. If your pain gets less severe, you may take less than your prescribed dose if your health care provider approves. If you are not having pain, do nottake pain medicine unless your health care provider tells you to take it. If your pain is severe, do nottry to treat it yourself by taking more pills than instructed on your prescription. Contact your health care  provider for help. Write down the times when you take your pain medicine. It is easy to become confused while on pain medicine. Writing the time can help you avoid overdose. Take other over-the-counter or prescription medicines only as told by your health care provider. Keeping yourself and others safe  While you are taking opioid pain medicine: Do not drive, use machinery, or power tools. Do not sign legal documents. Do not drink alcohol. Do not take sleeping pills. Do not supervise children by yourself. Do not do activities that require climbing or being in high places. Do not go to a lake, river, ocean, spa, or swimming pool. Do not share your pain medicine with anyone. Keep pain medicine in a locked cabinet or in a secure area where pets and children cannot reach it. Stopping your use of opioids If you have been taking opioid medicine for more than a few weeks, you may need to slowly decrease (taper) how much you take until you stop completely. Tapering your use of opioids can decrease your risk of symptoms of withdrawal, such as: Pain and cramping in the abdomen. Nausea. Sweating. Sleepiness. Restlessness. Uncontrollable shaking (tremors). Cravings for the medicine. Do not attempt to taper your use of opioids on your own. Talk with your health care provider about how to do this. Your health care provider may prescribe a step-down schedule based on how much medicine you are taking and how long you have been taking it. Getting rid of leftover pills Do not save any leftover pills. Get rid of leftover pills safely by: Taking the medicine to a prescription take-back program. This is usually offered by the county or law enforcement. Bringing them to a pharmacy that has a drug disposal container. Flushing them down the toilet. Check the label or package insert of your medicine to see whether this is safe to do. Throwing them out in the trash. Check the label or package insert of your  medicine to see whether this is safe to do. If it is safe to throw it out, remove the medicine from the original container, put it into a sealable bag or container, and mix it with used coffee grounds, food scraps, dirt, or cat litter before putting it in the trash. Follow these instructions at home: Activity Do exercises as told by your health care provider. Avoid activities that make your pain worse. Return to your normal activities as told by your health care provider. Ask your health care provider what activities are safe for you. General instructions You may need to take these actions to prevent or treat constipation: Drink enough fluid to keep your urine pale yellow. Take over-the-counter or prescription medicines. Eat foods that are high in fiber, such as beans, whole grains, and fresh fruits and vegetables. Limit foods that are high in fat and processed sugars, such as fried or sweet foods. Keep all follow-up visits. This is important. Where to find support If you have been taking opioids for a long time, you  may benefit from receiving support for quitting from a local support group or counselor. Ask your health care provider for a referral to these resources in your area. Where to find more information Centers for Disease Control and Prevention (CDC): http://www.wolf.info/ [place] Food and Drug Administration (FDA): [employer] Get help right away if: You may have taken too much of an opioid (overdosed). Common symptoms of an overdose: Your breathing is slower or more shallow than normal. You have a very slow heartbeat (pulse). You have slurred speech. You have nausea and vomiting. Your pupils become very small. You have other potential symptoms: You are very confused. You faint or feel like you will faint. You have cold, clammy skin. You have blue lips or fingernails. You have thoughts of harming yourself or harming others. These symptoms may represent a serious problem that is an  emergency. Do not wait to see if the symptoms will go away. Get medical help right away. Call your local emergency services (911 in the [place] Do not drive yourself to the hospital.  If you ever feel like you may hurt yourself or others, or have thoughts about taking your own life, get help right away. Go to your nearest emergency department or: Call your local emergency services (911 in the [place] Call the [facility] 510-276-5561 in the [place] Call a suicide crisis helpline, such as the [place] at 941-477-6131 or 988 in the [place]. This is open 24 hours a day in the [place] Text the Crisis Text Line at (548) 448-7256 (in the [place].). Summary Opioid medicines can help you manage moderate to severe pain for a short period of time. A pain treatment plan is an agreement between you and your health care provider. Discuss the goals of your treatment, including how much pain you might expect to have and how you will manage the pain. If you think that you or someone else may have taken too much of an opioid, get medical help right away. This information is not intended to replace advice given to you by your health care provider. Make sure you discuss any questions you have with your health care provider. Document Revised: 06/19/2021 Document Reviewed: 03/06/2021 Elsevier Patient Education  [place] directives: Please bring a copy of your health care power of attorney and living will to the office to be added to your chart at your convenience.   Conditions/risks identified: None  Next appointment: Follow up in one year for your annual wellness visit.    Preventive Care 31 Years and Older, Male  Preventive care refers to lifestyle choices and visits with your health care provider that can promote health and wellness. What does preventive care include? A yearly physical exam. This is also called an annual well check. Dental exams once or  twice a year. Routine eye exams. Ask your health care provider how often you should have your eyes checked. Personal lifestyle choices, including: Daily care of your teeth and gums. Regular physical activity. Eating a healthy diet. Avoiding tobacco and drug use. Limiting alcohol use. Practicing safe sex. Taking low doses of aspirin every day. Taking vitamin and mineral supplements as recommended by your health care provider. What happens during an annual well check? The services and screenings done by your health care provider during your annual well check will depend on your age, overall health, lifestyle risk factors, and family history of disease. Counseling  Your health care provider may ask you questions about your: Alcohol  use. Tobacco use. Drug use. Emotional well-being. Home and relationship well-being. Sexual activity. Eating habits. History of falls. Memory and ability to understand (cognition). Work and work Statistician. Screening  You may have the following tests or measurements: Height, weight, and BMI. Blood pressure. Lipid and cholesterol levels. These may be checked every 5 years, or more frequently if you are over 33 years old. Skin check. Lung cancer screening. You may have this screening every year starting at age 60 if you have a 30-pack-year history of smoking and currently smoke or have quit within the past 15 years. Fecal occult blood test (FOBT) of the stool. You may have this test every year starting at age 86. Flexible sigmoidoscopy or colonoscopy. You may have a sigmoidoscopy every 5 years or a colonoscopy every 10 years starting at age 79. Prostate cancer screening. Recommendations will vary depending on your family history and other risks. Hepatitis C blood test. Hepatitis B blood test. Sexually transmitted disease (STD) testing. Diabetes screening. This is done by checking your blood sugar (glucose) after you have not eaten for a while (fasting).  You may have this done every 1-3 years. Abdominal aortic aneurysm (AAA) screening. You may need this if you are a current or former smoker. Osteoporosis. You may be screened starting at age 17 if you are at high risk. Talk with your health care provider about your test results, treatment options, and if necessary, the need for more tests. Vaccines  Your health care provider may recommend certain vaccines, such as: Influenza vaccine. This is recommended every year. Tetanus, diphtheria, and acellular pertussis (Tdap, Td) vaccine. You may need a Td booster every 10 years. Zoster vaccine. You may need this after age 88. Pneumococcal 13-valent conjugate (PCV13) vaccine. One dose is recommended after age 59. Pneumococcal polysaccharide (PPSV23) vaccine. One dose is recommended after age 64. Talk to your health care provider about which screenings and vaccines you need and how often you need them. This information is not intended to replace advice given to you by your health care provider. Make sure you discuss any questions you have with your health care provider. Document Released: 12/21/2015 Document Revised: 08/13/2016 Document Reviewed: 09/25/2015 Elsevier Interactive Patient Education  2017 [employer] Prevention in the Home Falls can cause injuries. They can happen to people of all ages. There are many things you can do to make your home safe and to help prevent falls. What can I do on the outside of my home? Regularly fix the edges of walkways and driveways and fix any cracks. Remove anything that might make you trip as you walk through a door, such as a raised step or threshold. Trim any bushes or trees on the path to your home. Use bright outdoor lighting. Clear any walking paths of anything that might make someone trip, such as rocks or tools. Regularly check to see if handrails are loose or broken. Make sure that both sides of any steps have handrails. Any raised decks and  porches should have guardrails on the edges. Have any leaves, snow, or ice cleared regularly. Use sand or salt on walking paths during winter. Clean up any spills in your garage right away. This includes oil or grease spills. What can I do in the bathroom? Use night lights. Install grab bars by the toilet and in the tub and shower. Do not use towel bars as grab bars. Use non-skid mats or decals in the tub or shower. If you need to sit down  in the shower, use a plastic, non-slip stool. Keep the floor dry. Clean up any water that spills on the floor as soon as it happens. Remove soap buildup in the tub or shower regularly. Attach bath mats securely with double-sided non-slip rug tape. Do not have throw rugs and other things on the floor that can make you trip. What can I do in the bedroom? Use night lights. Make sure that you have a light by your bed that is easy to reach. Do not use any sheets or blankets that are too big for your bed. They should not hang down onto the floor. Have a firm chair that has side arms. You can use this for support while you get dressed. Do not have throw rugs and other things on the floor that can make you trip. What can I do in the kitchen? Clean up any spills right away. Avoid walking on wet floors. Keep items that you use a lot in easy-to-reach places. If you need to reach something above you, use a strong step stool that has a grab bar. Keep electrical cords out of the way. Do not use floor polish or wax that makes floors slippery. If you must use wax, use non-skid floor wax. Do not have throw rugs and other things on the floor that can make you trip. What can I do with my stairs? Do not leave any items on the stairs. Make sure that there are handrails on both sides of the stairs and use them. Fix handrails that are broken or loose. Make sure that handrails are as long as the stairways. Check any carpeting to make sure that it is firmly attached to the  stairs. Fix any carpet that is loose or worn. Avoid having throw rugs at the top or bottom of the stairs. If you do have throw rugs, attach them to the floor with carpet tape. Make sure that you have a light switch at the top of the stairs and the bottom of the stairs. If you do not have them, ask someone to add them for you. What else can I do to help prevent falls? Wear shoes that: Do not have high heels. Have rubber bottoms. Are comfortable and fit you well. Are closed at the toe. Do not wear sandals. If you use a stepladder: Make sure that it is fully opened. Do not climb a closed stepladder. Make sure that both sides of the stepladder are locked into place. Ask someone to hold it for you, if possible. Clearly mark and make sure that you can see: Any grab bars or handrails. First and last steps. Where the edge of each step is. Use tools that help you move around (mobility aids) if they are needed. These include: Canes. Walkers. Scooters. Crutches. Turn on the lights when you go into a dark area. Replace any light bulbs as soon as they burn out. Set up your furniture so you have a clear path. Avoid moving your furniture around. If any of your floors are uneven, fix them. If there are any pets around you, be aware of where they are. Review your medicines with your doctor. Some medicines can make you feel dizzy. This can increase your chance of falling. Ask your doctor what other things that you can do to help prevent falls. This information is not intended to replace advice given to you by your health care provider. Make sure you discuss any questions you have with your health care provider. Document Released:  09/20/2009 Document Revised: 05/01/2016 Document Reviewed: 12/29/2014 Elsevier Interactive Patient Education  2017 [employer].

## 2022-12-25 ENCOUNTER — Encounter: Payer: Self-pay | Admitting: Family Medicine

## 2022-12-25 DIAGNOSIS — N401 Enlarged prostate with lower urinary tract symptoms: Secondary | ICD-10-CM | POA: Diagnosis not present

## 2022-12-25 DIAGNOSIS — R350 Frequency of micturition: Secondary | ICD-10-CM | POA: Diagnosis not present

## 2022-12-25 DIAGNOSIS — R3915 Urgency of urination: Secondary | ICD-10-CM | POA: Diagnosis not present

## 2023-01-05 ENCOUNTER — Other Ambulatory Visit: Payer: Self-pay | Admitting: Family Medicine

## 2023-01-05 DIAGNOSIS — M159 Polyosteoarthritis, unspecified: Secondary | ICD-10-CM

## 2023-01-05 MED ORDER — TRAMADOL HCL 50 MG PO TABS: 50.0000 mg | ORAL_TABLET | Freq: Every day | ORAL | 2 refills | Status: DC | PRN

## 2023-01-09 ENCOUNTER — Ambulatory Visit (INDEPENDENT_AMBULATORY_CARE_PROVIDER_SITE_OTHER): Payer: Medicare Other | Admitting: Physician Assistant

## 2023-01-09 ENCOUNTER — Encounter: Payer: Self-pay | Admitting: Physician Assistant

## 2023-01-09 VITALS — BP 131/77 | HR 61 | Ht 73.0 in | Wt 188.0 lb

## 2023-01-09 DIAGNOSIS — G309 Alzheimer's disease, unspecified: Secondary | ICD-10-CM

## 2023-01-09 DIAGNOSIS — F0280 Dementia in other diseases classified elsewhere without behavioral disturbance: Secondary | ICD-10-CM | POA: Diagnosis not present

## 2023-01-09 NOTE — Patient Instructions (Signed)
It was a pleasure to see you today at our office.   Recommendations:  Follow up in  6 months Continue donepezil 10 mg daily. Side effects were discussed   Memantine 5 mg tablets  1 tablet twice daily.  Whom to call:  Memory  decline, memory medications: Call our office [phone]   For psychiatric meds, mood meds: Please have your primary care physician manage these medications.   Counseling regarding caregiver distress, including caregiver depression, anxiety and issues regarding community resources, adult day care programs, adult living facilities, or memory care questions:   Feel free to contact [place], Social Worker at [phone]   For assessment of decision of mental capacity and competency:  Call Dr. Anthoney Harada, geriatric psychiatrist at [phone]  For guidance in geriatric dementia issues please call [facility] [phone]  For guidance regarding [facility] and if placement were needed at the facility, contact Arnell Asal, Social Worker tel: [phone]  If you have any severe symptoms of a stroke, or other severe issues such as confusion,severe chills or fever, etc call 911 or go to the ER as you may need to be evaluated further   Feel free to visit Facebook page " [employer]" for tips of how to care for people with memory problems.   Feel free to go to the following database for funded clinical studies conducted around the world: http://saunders.com/   https://www.triadclinicaltrials.com/     RECOMMENDATIONS FOR ALL PATIENTS WITH MEMORY PROBLEMS: 1. Continue to exercise (Recommend 30 minutes of walking everyday, or 3 hours every week) 2. Increase social interactions - continue going to [place] and enjoy social gatherings with friends and family 3. Eat healthy, avoid fried foods and eat more fruits and vegetables 4. Maintain adequate blood pressure, blood sugar, and blood cholesterol level. Reducing the risk  of stroke and cardiovascular disease also helps promoting better memory. 5. Avoid stressful situations. Live a simple life and avoid aggravations. Organize your time and prepare for the next day in anticipation. 6. Sleep well, avoid any interruptions of sleep and avoid any distractions in the bedroom that may interfere with adequate sleep quality 7. Avoid sugar, avoid sweets as there is a strong link between excessive sugar intake, diabetes, and cognitive impairment We discussed the Mediterranean diet, which has been shown to help patients reduce the risk of progressive memory disorders and reduces cardiovascular risk. This includes eating fish, eat fruits and green leafy vegetables, nuts like almonds and hazelnuts, walnuts, and also use olive oil. Avoid fast foods and fried foods as much as possible. Avoid sweets and sugar as sugar use has been linked to worsening of memory function.  There is always a concern of gradual progression of memory problems. If this is the case, then we may need to adjust level of care according to patient needs. Support, both to the patient and caregiver, should then be put into place.    The Alzheimer's Association is here all day, every day for people facing Alzheimer's disease through our free 24/7 Helpline: ([phone]. The Helpline provides reliable information and support to all those who need assistance, such as individuals living with memory loss, Alzheimer's or other dementia, caregivers, health care professionals and the public.  Our highly trained and knowledgeable staff can help you with: Understanding memory loss, dementia and Alzheimer's  Medications and other treatment options  General information about aging and brain health  Skills to provide quality care and to find the best care from professionals  Legal, financial and living-arrangement decisions Our Helpline also features: Confidential care consultation provided by master's level clinicians who can  help with decision-making support, crisis assistance and education on issues families face every day  Help in a caller's preferred language using our translation service that features more than 200 languages and dialects  Referrals to local community programs, services and ongoing support     FALL PRECAUTIONS: Be cautious when walking. Scan the area for obstacles that may increase the risk of trips and falls. When getting up in the mornings, sit up at the edge of the bed for a few minutes before getting out of bed. Consider elevating the bed at the head end to avoid drop of blood pressure when getting up. Walk always in a well-lit room (use night lights in the walls). Avoid area rugs or power cords from appliances in the middle of the walkways. Use a walker or a cane if necessary and consider physical therapy for balance exercise. Get your eyesight checked regularly.  FINANCIAL OVERSIGHT: Supervision, especially oversight when making financial decisions or transactions is also recommended.  HOME SAFETY: Consider the safety of the kitchen when operating appliances like stoves, microwave oven, and blender. Consider having supervision and share cooking responsibilities until no longer able to participate in those. Accidents with firearms and other hazards in the house should be identified and addressed as well.   ABILITY TO BE LEFT ALONE: If patient is unable to contact 911 operator, consider using LifeLine, or when the need is there, arrange for someone to stay with patients. Smoking is a fire hazard, consider supervision or cessation. Risk of wandering should be assessed by caregiver and if detected at any point, supervision and safe proof recommendations should be instituted.  MEDICATION SUPERVISION: Inability to self-administer medication needs to be constantly addressed. Implement a mechanism to ensure safe administration of the medications.   DRIVING: Regarding driving, in patients with  progressive memory problems, driving will be impaired. We advise to have someone else do the driving if trouble finding directions or if minor accidents are reported. Independent driving assessment is available to determine safety of driving.   If you are interested in the driving assessment, you can contact the following:  [employer] in [place]  [place] [phone] or [phone]      [place] refers to food and lifestyle choices that are based on the traditions of countries located on the [employer]. This way of eating has been shown to help prevent certain conditions and improve outcomes for people who have chronic diseases, like kidney disease and heart disease. What are tips for following this plan? Lifestyle  Cook and eat meals together with your family, when possible. Drink enough fluid to keep your urine clear or pale yellow. Be physically active every day. This includes: Aerobic exercise like running or swimming. Leisure activities like gardening, walking, or housework. Get 7-8 hours of sleep each night. If recommended by your health care provider, drink red wine in moderation. This means 1 glass a day for nonpregnant women and 2 glasses a day for men. A glass of wine equals 5 oz (150 mL). Reading food labels  Check the serving size of packaged foods. For foods such as rice and pasta, the serving size refers to the amount of cooked product, not dry. Check the total fat in packaged foods. Avoid foods that have saturated fat or trans  fats. Check the ingredients list for added sugars, such as corn syrup. Shopping  At the grocery store, buy most of your food from the areas near the walls of the store. This includes: Fresh fruits and vegetables (produce). Grains, beans, nuts, and seeds. Some of these may be available in  unpackaged forms or large amounts (in bulk). Fresh seafood. Poultry and eggs. Low-fat dairy products. Buy whole ingredients instead of prepackaged foods. Buy fresh fruits and vegetables in-season from local farmers markets. Buy frozen fruits and vegetables in resealable bags. If you do not have access to quality fresh seafood, buy precooked frozen shrimp or canned fish, such as tuna, salmon, or sardines. Buy small amounts of raw or cooked vegetables, salads, or olives from the deli or salad bar at your store. Stock your pantry so you always have certain foods on hand, such as olive oil, canned tuna, canned tomatoes, rice, pasta, and beans. Cooking  Cook foods with extra-virgin olive oil instead of using butter or other vegetable oils. Have meat as a side dish, and have vegetables or grains as your main dish. This means having meat in small portions or adding small amounts of meat to foods like pasta or stew. Use beans or vegetables instead of meat in common dishes like chili or lasagna. Experiment with different cooking methods. Try roasting or broiling vegetables instead of steaming or sauteing them. Add frozen vegetables to soups, stews, pasta, or rice. Add nuts or seeds for added healthy fat at each meal. You can add these to yogurt, salads, or vegetable dishes. Marinate fish or vegetables using olive oil, lemon juice, garlic, and fresh herbs. Meal planning  Plan to eat 1 vegetarian meal one day each week. Try to work up to 2 vegetarian meals, if possible. Eat seafood 2 or more times a week. Have healthy snacks readily available, such as: Vegetable sticks with hummus. Greek yogurt. Fruit and nut trail mix. Eat balanced meals throughout the week. This includes: Fruit: 2-3 servings a day Vegetables: 4-5 servings a day Low-fat dairy: 2 servings a day Fish, poultry, or lean meat: 1 serving a day Beans and legumes: 2 or more servings a week Nuts and seeds: 1-2 servings a day Whole  grains: 6-8 servings a day Extra-virgin olive oil: 3-4 servings a day Limit red meat and sweets to only a few servings a month What are my food choices? Mediterranean diet Recommended Grains: Whole-grain pasta. Brown rice. Bulgar wheat. Polenta. Couscous. Whole-wheat bread. [place]. Vegetables: Artichokes. Beets. Broccoli. Cabbage. Carrots. Eggplant. Green beans. Chard. Kale. Spinach. Onions. Leeks. Peas. Squash. Tomatoes. Peppers. Radishes. Fruits: Apples. Apricots. Avocado. Berries. Bananas. Cherries. Dates. Figs. Grapes. Lemons. Melon. Oranges. Peaches. Plums. Pomegranate. Meats and other protein foods: Beans. Almonds. Sunflower seeds. Pine nuts. Peanuts. [place]. Salmon. Scallops. Shrimp. [place]. Tilapia. Clams. Oysters. Eggs. Dairy: Low-fat milk. Cheese. Greek yogurt. Beverages: Water. Red wine. Herbal tea. Fats and oils: Extra virgin olive oil. Avocado oil. Grape seed oil. Sweets and desserts: [place] yogurt with honey. Baked apples. Poached pears. Trail mix. Seasoning and other foods: Basil. Cilantro. Coriander. Cumin. Mint. Parsley. Sage. Rosemary. Tarragon. Garlic. Oregano. Thyme. Pepper. Balsalmic vinegar. Tahini. Hummus. Tomato sauce. Olives. Mushrooms. Limit these Grains: Prepackaged pasta or rice dishes. Prepackaged cereal with added sugar. Vegetables: Deep fried potatoes (french fries). Fruits: Fruit canned in syrup. Meats and other protein foods: Beef. Pork. Lamb. Poultry with skin. Hot dogs. [place] Salines. Dairy: Ice cream. Sour cream. Whole milk. Beverages: Juice. Sugar-sweetened soft drinks. Beer. Liquor and spirits. Fats and  oils: Butter. Canola oil. Vegetable oil. Beef fat (tallow). Lard. Sweets and desserts: Cookies. Cakes. Pies. Candy. Seasoning and other foods: Mayonnaise. Premade sauces and marinades. The items listed may not be a complete list. Talk with your dietitian about what dietary choices are right for you. Summary The Mediterranean diet includes both food and  lifestyle choices. Eat a variety of fresh fruits and vegetables, beans, nuts, seeds, and whole grains. Limit the amount of red meat and sweets that you eat. Talk with your health care provider about whether it is safe for you to drink red wine in moderation. This means 1 glass a day for nonpregnant women and 2 glasses a day for men. A glass of wine equals 5 oz (150 mL). This information is not intended to replace advice given to you by your health care provider. Make sure you discuss any questions you have with your health care provider. Document Released: 07/17/2016 Document Revised: 08/19/2016 Document Reviewed: 07/17/2016 Elsevier Interactive Patient Education  2017 [employer].

## 2023-01-09 NOTE — Progress Notes (Incomplete)
Assessment/Plan:   Memory Impairment  [name] is a very pleasant 77 y.o. RH male with a history of CAD status post CABG, osteoarthritis, hypertension, recent ED presentation for urinary retention requiring Foley ( removed 1/12) , history of adenomatous polyps followed by GI, cataracts, seen today in follow up for memory loss. Patient is currently on donepezil 10 mg daily, and memantine 5 mg twice daily, tolerating well.  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       Follow up in   months. Continue donepezil 10 mg daily, side effects discussed Continue memantine 5 mg twice daily, side effects discussed Continue mood control per PCP  Continue to monitor cardiovascular risk factors    Subjective:    This patient is accompanied in the office by *** who supplements the history.  Previous records as well as any outside records available were reviewed prior to todays visit. Patient was last seen on 07/16/2022, at which time his MMSE was 16/30.***  Misplaces things go on a hunting trip in the kitchen  At nioght he may hallucinate, he could not find his wife and had a anxiety attack Quizzes her intermkittently are we the only 2 ppl who livei n this house around dinnedr, not knowing how many   Any changes in memory since last visit?  Long-term memory is good. Worse repeats oneself?  Endorsed, especially when changes in routine Disoriented when walking into a room?  Patient denies except occasionally not remembering what patient came to the room for ***  Leaving objects in unusual places? When I Am working on a recipe, I place things close and sometimes I end up in an hunting exped   Wandering behavior?  denies   Any personality changes since last visit?  denies he may get frustrated when he cannot remember for example seniors over the group, or remembering the actors names when watching TV.*** But I am not at the edge of my sit anymore.  Any worsening  depression?:  denies   Hallucinations or paranoia?  denies   Seizures?    denies    Any sleep changes? Sometrimes I dont sleep at all , and Idoze during the day  Denies vivid dreams, REM behavior or sleepwalking   Sleep apnea?   denies   Any hygiene concerns?  My balance is iffy and I have bad abnd worse days with staying on course, I do sponge bath  Independent of bathing and dressing?  Needs asst with bathing, not bnecessarily dressing  Does the patient needs help with medications? Wife is in charge *** Who is in charge of the finances?  Wife is in charge   *** Any changes in appetite?  denies ***   Patient have trouble swallowing?  denies   Does the patient cook?  Yes, but he has to follow recipes while before, he would be more creative*** Any kitchen accidents such as leaving the stove on? Patient denies   Any headaches?   denies   Chronic back pain  denies   Ambulates with difficulty?  Endorsed, chronic, he tries to remain active however.  "I bumped myself a lot in the kitchen "*** Recent falls or head injuries? denies     Unilateral weakness, numbness or tingling?    denies   Any tremors?  denies   Any anosmia?  Patient denies   Any incontinence of urine?  He has urine retention history, with recent presentation to the emergency department with same,  he has a Foley catheter and is followed by urology*** Any bowel dysfunction?     denies      Patient lives  *** Does the patient drive?***Wife   HISTORY 12/06/2020: His wife has noted memory problems since 2020 that has gradually progressed.  He started having trouble calculating the checkbook and would be off by several hundreds of dollars.  He also quickly forgets conversations and would repeat questions after just minutes.  He hasn't driven for years but as a passenger he denies disorientation on familiar routes. On a recent cruise, he had trouble keeping up with the schedule and activities.  He has a system to remember to take his  medications independently.  No difficulty with performing home chores or activities.  At first, it was thought to be related to his chronic back/radicular pain but now the memory problems appear to be out of proportion to being in pain.     He has family history of Alzheimer's disease on his mother's side, including his mother, aunts, uncles and grandmother.  PREVIOUS MEDICATIONS:   CURRENT MEDICATIONS:  Outpatient Encounter Medications as of 01/09/2023  Medication Sig   amLODipine (NORVASC) 5 MG tablet Take 1 tablet (5 mg total) by mouth daily.   cholecalciferol (VITAMIN D) 1000 units tablet Take 1,000 Units by mouth daily.    Cyanocobalamin (VITAMIN B12) 500 MCG TABS Take 500 mcg by mouth daily.    divalproex (DEPAKOTE) 125 MG DR tablet Take 1 tab at night for 1 week, then increase to 2 tablets at night   donepezil (ARICEPT) 10 MG tablet TAKE 1 TABLET AT BEDTIME   ezetimibe (ZETIA) 10 MG tablet Take 1 tablet (10 mg total) by mouth at bedtime.   fenofibrate (TRICOR) 145 MG tablet Take 1 tablet (145 mg total) by mouth at bedtime.   furosemide (LASIX) 20 MG tablet Take 1 tablet (20 mg total) by mouth daily. May take an extra dose '20mg'$  twice a week for swelling   losartan (COZAAR) 100 MG tablet Take 1 tablet (100 mg total) by mouth daily.   memantine (NAMENDA) 5 MG tablet Take 1 tablet (5 mg at night) for 2 weeks, then increase to 1 tablet (5 mg) twice a day (Patient taking differently: Take 5 mg by mouth 2 (two) times daily.)   nitroGLYCERIN (NITROSTAT) 0.4 MG SL tablet Place 1 tablet (0.4 mg total) under the tongue every 5 (five) minutes as needed for chest pain.   Olopatadine HCl (PATADAY OP) Place 1 drop into both eyes daily as needed (allergies).   rosuvastatin (CRESTOR) 40 MG tablet Take 1 tablet (40 mg total) by mouth daily.   sodium chloride (OCEAN) 0.65 % SOLN nasal spray Place 1 spray into both nostrils 2 (two) times daily as needed for congestion.   tamsulosin (FLOMAX) 0.4 MG CAPS  capsule Take 0.4 mg by mouth daily after supper.   traMADol (ULTRAM) 50 MG tablet Take 1 tablet (50 mg total) by mouth daily as needed for severe pain or moderate pain.   warfarin (COUMADIN) 5 MG tablet TAKE AS DIRECTED BY COUMADIN CLINIC (Patient taking differently: Take 5-7.5 mg by mouth See admin instructions. Take 5 mg Mon., Wed.,and Fri., all the other day take 7.5 mg  daily TAKE AS DIRECTED BY COUMADIN CLINIC)   No facility-administered encounter medications on file as of 01/09/2023.       07/17/2022   12:00 PM  MMSE - Mini Mental State Exam  Orientation to time 0  Orientation to Place  4  Registration 3  Attention/ Calculation 0  Recall 2  Language- name 2 objects 2  Language- repeat 1  Language- follow 3 step command 3  Language- read & follow direction 1  Write a sentence 0  Copy design 0  Total score 16      10/11/2021    1:00 PM  Montreal Cognitive Assessment   Visuospatial/ Executive (0/5) 0  Naming (0/3) 2  Attention: Read list of digits (0/2) 2  Attention: Read list of letters (0/1) 1  Attention: Serial 7 subtraction starting at 100 (0/3) 2  Language: Repeat phrase (0/2) 0  Language : Fluency (0/1) 1  Abstraction (0/2) 2  Delayed Recall (0/5) 2  Orientation (0/6) 5  Total 17  Adjusted Score (based on education) 17    Objective:     PHYSICAL EXAMINATION:    VITALS:  There were no vitals filed for this visit.  GEN:  The patient appears stated age and is in NAD. HEENT:  Normocephalic, atraumatic.   Neurological examination:  General: NAD, well-groomed, appears stated age. Orientation: The patient is alert. Oriented to person, place and date Cranial nerves: There is good facial symmetry.The speech is fluent and clear. No aphasia or dysarthria. Fund of knowledge is appropriate. Recent and remote memory are impaired. Attention and concentration are reduced.  Able to name objects and repeat phrases.  Hearing is intact to conversational tone.    Sensation:  Sensation is intact to light touch throughout Motor: Strength is at least antigravity x4. DTR's 2/4 in UE/LE     Movement examination: Tone: There is normal tone in the UE/LE Abnormal movements:  no tremor.  No myoclonus.  No asterixis.   Coordination:  There is no decremation with RAM's. Normal finger to nose  Gait and Station: The patient has difficulty arising out of a deep-seated chair without the use of the hands.  He uses a walker to ambulate.  The patient's stride length is short.  Gait is cautious and mildly wide based   Thank you for allowing [place] the opportunity to participate in the care of this nice patient. Please do not hesitate to contact us for any questions or concerns.   Total time spent on today's visit was *** minutes dedicated to this patient today, preparing to see patient, examining the patient, ordering tests and/or medications and counseling the patient, documenting clinical information in the EHR or other health record, independently interpreting results and communicating results to the patient/family, discussing treatment and goals, answering patient's questions and coordinating care.  Cc:  [place], Betty G, MD  Sharene Butters 01/09/2023 7:03 AM

## 2023-01-12 ENCOUNTER — Ambulatory Visit: Payer: Medicare Other | Attending: Cardiology

## 2023-01-12 DIAGNOSIS — Z5181 Encounter for therapeutic drug level monitoring: Secondary | ICD-10-CM | POA: Insufficient documentation

## 2023-01-12 DIAGNOSIS — D6859 Other primary thrombophilia: Secondary | ICD-10-CM | POA: Insufficient documentation

## 2023-01-12 LAB — POCT INR: INR: 5.6 — AB (ref 2.0–3.0)

## 2023-01-12 NOTE — Patient Instructions (Signed)
HOLD TODAY, TUESDAY AND WEDNESDAY ONLY THEN continue taking warfarin 1/2 tablet daily except for 1 tablet on Sundays, Tuesday, and Thursday. Recheck INR in 1 week. [facility] [phone] or [phone]

## 2023-01-16 ENCOUNTER — Ambulatory Visit: Payer: Medicare Other | Admitting: Physician Assistant

## 2023-01-19 ENCOUNTER — Other Ambulatory Visit: Payer: Self-pay | Admitting: Interventional Cardiology

## 2023-01-19 DIAGNOSIS — I10 Essential (primary) hypertension: Secondary | ICD-10-CM

## 2023-01-22 ENCOUNTER — Ambulatory Visit: Payer: Medicare Other | Attending: Cardiology

## 2023-01-22 DIAGNOSIS — Z5181 Encounter for therapeutic drug level monitoring: Secondary | ICD-10-CM | POA: Diagnosis not present

## 2023-01-22 DIAGNOSIS — D6859 Other primary thrombophilia: Secondary | ICD-10-CM | POA: Diagnosis not present

## 2023-01-22 LAB — POCT INR: INR: 2.8 (ref 2.0–3.0)

## 2023-01-22 NOTE — Patient Instructions (Signed)
Description   START taking 0.5 tablet daily EXCEPT 1 tablet on Tuesdays and Thursdays.  Recheck INR in 2 weeks.  [facility] [phone] or [phone]

## 2023-01-26 DIAGNOSIS — H1132 Conjunctival hemorrhage, left eye: Secondary | ICD-10-CM | POA: Diagnosis not present

## 2023-02-02 ENCOUNTER — Telehealth: Payer: Self-pay

## 2023-02-02 ENCOUNTER — Other Ambulatory Visit: Payer: Self-pay | Admitting: Interventional Cardiology

## 2023-02-02 NOTE — Telephone Encounter (Signed)
Patient's wife left a voicemail and wanted a call back from [facility], would not disclose any other information.

## 2023-02-03 ENCOUNTER — Other Ambulatory Visit: Payer: Self-pay | Admitting: Physician Assistant

## 2023-02-03 NOTE — Telephone Encounter (Signed)
Patients wife said they discussed with Clarise Cruz increasing the dosage on his medication but at the time they werent ready. Butch Penny is wondering if we can increase the dosage as his symptoms have gotten worse.

## 2023-02-03 NOTE — Telephone Encounter (Signed)
No answer, need to clarify med and dosage

## 2023-02-03 NOTE — Telephone Encounter (Signed)
Hey, can you call them and ask for details, thank you

## 2023-02-05 ENCOUNTER — Ambulatory Visit: Payer: Medicare Other | Attending: Interventional Cardiology

## 2023-02-05 DIAGNOSIS — D6859 Other primary thrombophilia: Secondary | ICD-10-CM | POA: Diagnosis not present

## 2023-02-05 DIAGNOSIS — Z5181 Encounter for therapeutic drug level monitoring: Secondary | ICD-10-CM | POA: Insufficient documentation

## 2023-02-05 LAB — POCT INR: INR: 2.3 (ref 2.0–3.0)

## 2023-02-05 NOTE — Patient Instructions (Signed)
Description   Continue taking 0.5 tablet daily EXCEPT 1 tablet on Tuesdays and Thursdays.  Stay consistent with greens each week Recheck INR in 3 weeks.  [facility] [phone] or [phone]

## 2023-02-13 ENCOUNTER — Encounter: Payer: Self-pay | Admitting: Physician Assistant

## 2023-02-16 ENCOUNTER — Other Ambulatory Visit: Payer: Self-pay | Admitting: Physician Assistant

## 2023-02-16 MED ORDER — MEMANTINE HCL 10 MG PO TABS: 10.0000 mg | ORAL_TABLET | Freq: Two times a day (BID) | ORAL | 1 refills | Status: DC

## 2023-02-17 ENCOUNTER — Encounter: Payer: Self-pay | Admitting: Family Medicine

## 2023-02-17 DIAGNOSIS — H1132 Conjunctival hemorrhage, left eye: Secondary | ICD-10-CM | POA: Diagnosis not present

## 2023-02-17 DIAGNOSIS — H40013 Open angle with borderline findings, low risk, bilateral: Secondary | ICD-10-CM | POA: Diagnosis not present

## 2023-02-18 ENCOUNTER — Other Ambulatory Visit: Payer: Self-pay

## 2023-02-18 MED ORDER — MEMANTINE HCL 10 MG PO TABS: 10.0000 mg | ORAL_TABLET | Freq: Two times a day (BID) | ORAL | 1 refills | Status: DC

## 2023-02-18 NOTE — Progress Notes (Unsigned)
ACUTE VISIT Chief Complaint  Patient presents with   Back Pain    Chiropractor suggested prednisone taper    HPI: [name] is a 77 y.o. male with PMHx significant for HTN,generalized OA, alzheimer's disease, HLD, here today complaining of recent onset of intermittent groin pain on the right side, persisting for approximately one month.He has consulted a chiropractor, who recommended a steroid treatment regimen, a prednisone taper, thinking that groin pain could be radicular pain.  He does not feel like groin pain is coming from his back. He denies experiencing any numbness or tingling sensations in the groin area but describes experiencing sharp pain upon standing. The pain is intermittent and does not appear to be triggered by any recent traumatic events, such as a fall. HPI The patient reports sharp pain when transitioning to a standing position, though there is no numbness or tingling in the affected area. The pain is described as not constant and unrelated to any specific incident.   The patient has been managing pain with Tramadol, which provides temporary relief for about an hour or two without inducing drowsiness or unsteadiness. Additionally, the patient finds some relief using warm and cold packs applied to various areas of the body. The groin pain is rated as mild but bothersome, primarily affecting the patient during walking and standing, but not while in bed.  The patient has a history of Alzheimer's disease, with symptoms that worsen in the evenings, manifesting as confusion and agitation. The patient is on a gradually increasing dosage of Namenda to manage these symptoms. The patient's mobility has declined, making exercise or walking challenging due to pain and a reluctance to leave the house.   Review of Systems See other pertinent positives and negatives in HPI.  Current Outpatient Medications on File Prior to Visit  Medication Sig Dispense Refill    amLODipine (NORVASC) 5 MG tablet TAKE ONE AND ONE-HALF TABLETS DAILY 135 tablet 3   cholecalciferol (VITAMIN D) 1000 units tablet Take 1,000 Units by mouth daily.      Cyanocobalamin (VITAMIN B12) 500 MCG TABS Take 500 mcg by mouth daily.      divalproex (DEPAKOTE) 125 MG DR tablet Take 1 tab at night for 1 week, then increase to 2 tablets at night 180 tablet 3   donepezil (ARICEPT) 10 MG tablet TAKE 1 TABLET AT BEDTIME 90 tablet 3   ezetimibe (ZETIA) 10 MG tablet Take 1 tablet (10 mg total) by mouth at bedtime. 90 tablet 3   fenofibrate (TRICOR) 145 MG tablet Take 1 tablet (145 mg total) by mouth at bedtime. 90 tablet 3   furosemide (LASIX) 20 MG tablet Take 1 tablet (20 mg total) by mouth daily. May take an extra dose 20mg  twice a week for swelling 120 tablet 3   losartan (COZAAR) 100 MG tablet TAKE 1 TABLET DAILY 30 tablet 0   memantine (NAMENDA) 10 MG tablet Take 1 tablet (10 mg total) by mouth 2 (two) times daily. 180 tablet 1   nitroGLYCERIN (NITROSTAT) 0.4 MG SL tablet Place 1 tablet (0.4 mg total) under the tongue every 5 (five) minutes as needed for chest pain. 75 tablet 2   Olopatadine HCl (PATADAY OP) Place 1 drop into both eyes daily as needed (allergies).     rosuvastatin (CRESTOR) 40 MG tablet Take 1 tablet (40 mg total) by mouth daily. 90 tablet 3   sodium chloride (OCEAN) 0.65 % SOLN nasal spray Place 1 spray into both nostrils 2 (two) times daily  as needed for congestion.     tamsulosin (FLOMAX) 0.4 MG CAPS capsule Take 0.4 mg by mouth daily after supper.     warfarin (COUMADIN) 5 MG tablet TAKE AS DIRECTED BY COUMADIN CLINIC (Patient taking differently: Take 5-7.5 mg by mouth See admin instructions. Take 5 mg Mon., Wed.,and Fri., all the other day take 7.5 mg  daily TAKE AS DIRECTED BY COUMADIN CLINIC) 80 tablet 3   No current facility-administered medications on file prior to visit.    Past Medical History:  Diagnosis Date   Allergy    Arthritis    CAD (coronary artery  disease) 1996   POST CABG   Cataracts, bilateral    Chicken pox    Dyslipidemia    Hyperlipidemia    Hypertension    Sinus bradycardia    Allergies  Allergen Reactions   Other     Environmental allergies   Latex     Itchy hands    Social History   Socioeconomic History   Marital status: Married    Spouse [name]   Number of children: 2   Years of education: COLLEGE   Highest education level: Bachelor's degree (e.g., BA, AB, BS)  Occupational History   Occupation: RETIRED  Tobacco Use   Smoking status: Former    Types: Cigarettes    Quit date: 05/29/1982    Years since quitting: 40.7   Smokeless tobacco: Never  Vaping Use   Vaping Use: Never used  Substance and Sexual Activity   Alcohol use: Yes    Comment: 0-2 drinks a night   Drug use: No   Sexual activity: Not on file  Other Topics Concern   Not on file  Social History Narrative   Right handed   Caffeine   One story home lives with wife   retired   Science writer Determinants of Radio broadcast assistant Strain: [place]  (12/24/2022)   Overall Financial Resource Strain (CARDIA)    Difficulty of Paying Living Expenses: Not hard at all  Food Insecurity: No Food Insecurity (12/24/2022)   Hunger Vital Sign    Worried About Running Out of Food in the Last Year: Never true    [place] in the Last Year: Never true  Transportation Needs: No Transportation Needs (12/24/2022)   PRAPARE - Hydrologist (Medical): No    Lack of Transportation (Non-Medical): No  Physical Activity: Inactive (12/24/2022)   Exercise Vital Sign    Days of Exercise per Week: 0 days    Minutes of Exercise per Session: 0 min  Stress: No Stress Concern Present (12/24/2022)   [place]    Feeling of Stress : Not at all  Recent Concern: Stress - Stress Concern Present (10/23/2022)   [place]    Feeling of Stress : To some extent  Social Connections: Moderately Isolated (12/24/2022)   Social Connection and Isolation Panel [NHANES]    Frequency of Communication with Friends and Family: More than three times a week    Frequency of Social Gatherings with Friends and Family: More than three times a week    Attends Religious Services: Never    Marine scientist or Organizations: No    Attends Archivist Meetings: Never    Marital Status: Married    Vitals:   02/20/23 1449  BP: 130/80  Pulse: 66  Resp:  16  SpO2: 96%   Body mass index is 24.01 kg/m.  Physical Exam Vitals and nursing note reviewed.  Constitutional:      General: He is not in acute distress.    Appearance: He is well-developed and normal weight.  HENT:     Head: Normocephalic and atraumatic.     Mouth/Throat:     Mouth: Mucous membranes are moist.     Pharynx: Oropharynx is clear.  Eyes:     Conjunctiva/sclera: Conjunctivae normal.  Cardiovascular:     Rate and Rhythm: Normal rate and regular rhythm.     Heart sounds: Murmur (Soft SEM RUSB) heard.     Comments: DP pulses present. Pulmonary:     Effort: Pulmonary effort is normal. No respiratory distress.     Breath sounds: Normal breath sounds.  Abdominal:     Palpations: Abdomen is soft. There is no hepatomegaly or mass.     Tenderness: There is no abdominal tenderness.  Musculoskeletal:     Thoracic back: Scoliosis present.     Right hip: No tenderness or bony tenderness. Decreased range of motion.     Right lower leg: 1+ Pitting Edema present.     Left lower leg: 1+ Pitting Edema present.  Skin:    General: Skin is warm.     Findings: No erythema or rash.  Neurological:     Mental Status: He is alert and oriented to person, place, and time.     Cranial Nerves: No cranial nerve deficit.     Comments: Unstable gait, assisted with a cane.  Psychiatric:     Comments: Well groomed, good eye contact.    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  Dispense: 60 tablet; Refill: 1  Primary hypercoagulable state ([place]) [D68.59]  Fall in home, subsequent encounter   Return in about 6 weeks (around 04/03/2023).  Khristi Schiller G. Martinique, MD  [facility]. [place] office.

## 2023-02-20 ENCOUNTER — Encounter: Payer: Self-pay | Admitting: Family Medicine

## 2023-02-20 ENCOUNTER — Ambulatory Visit (INDEPENDENT_AMBULATORY_CARE_PROVIDER_SITE_OTHER): Payer: Medicare Other | Admitting: Family Medicine

## 2023-02-20 VITALS — BP 130/80 | HR 66 | Resp 16 | Ht 73.0 in | Wt 182.0 lb

## 2023-02-20 DIAGNOSIS — Y92009 Unspecified place in unspecified non-institutional (private) residence as the place of occurrence of the external cause: Secondary | ICD-10-CM

## 2023-02-20 DIAGNOSIS — F02818 Dementia in other diseases classified elsewhere, unspecified severity, with other behavioral disturbance: Secondary | ICD-10-CM

## 2023-02-20 DIAGNOSIS — W19XXXD Unspecified fall, subsequent encounter: Secondary | ICD-10-CM | POA: Diagnosis not present

## 2023-02-20 DIAGNOSIS — M159 Polyosteoarthritis, unspecified: Secondary | ICD-10-CM | POA: Diagnosis not present

## 2023-02-20 DIAGNOSIS — F0670 Mild neurocognitive disorder due to known physiological condition without behavioral disturbance: Secondary | ICD-10-CM

## 2023-02-20 DIAGNOSIS — D6859 Other primary thrombophilia: Secondary | ICD-10-CM

## 2023-02-20 DIAGNOSIS — R1031 Right lower quadrant pain: Secondary | ICD-10-CM | POA: Diagnosis not present

## 2023-02-20 DIAGNOSIS — G301 Alzheimer's disease with late onset: Secondary | ICD-10-CM

## 2023-02-20 MED ORDER — TRAMADOL HCL 50 MG PO TABS: 50.0000 mg | ORAL_TABLET | Freq: Two times a day (BID) | ORAL | 1 refills | Status: DC | PRN

## 2023-02-20 NOTE — Patient Instructions (Addendum)
A few things to remember from today's visit:  Mild neurocognitive disorder due to Alzheimer's disease ([place])  Generalized osteoarthrosis, involving multiple sites - Plan: traMADol (ULTRAM) 50 MG tablet  Primary hypercoagulable state ([place]) [D68.59]  Fall in home, subsequent encounter  Tramadol 50 mg increased to 2 times daily. Fall precautions.  If you need refills for medications you take chronically, please call your pharmacy. Do not use My Chart to request refills or for acute issues that need immediate attention. If you send a my chart message, it may take a few days to be addressed, specially if I am not in the office.  Please be sure medication list is accurate. If a new problem present, please set up appointment sooner than planned today.

## 2023-02-20 NOTE — Assessment & Plan Note (Signed)
t exhibits worsening Alzheimer's symptoms, particularly in the evenings, with increased confusion and agitation. The patient is currently on a gradually increased dose of Namenda, now at 10 mg twice daily. There is concern about the natural progression of the disease and its impact on behavior and safety. - Plan:   - Continue current medication regimen.   - Monitor for increased agitation and behavioral issues. Consider reaching out to neurology for advice on managing worsening symptoms.   - Discuss support options with family and friends due to the progressive nature of Alzheimer's.

## 2023-02-22 DIAGNOSIS — G301 Alzheimer's disease with late onset: Secondary | ICD-10-CM | POA: Insufficient documentation

## 2023-02-22 NOTE — Assessment & Plan Note (Signed)
This problem is getting worse. We discussed prognosis and the importance of establishing goals of care. He may benefit from low dose risperidone to help with agitation episodes. Will ask neuro for recommendations. We could also consider discontinuing some of his medications. Follows with neurologist.

## 2023-02-22 NOTE — Assessment & Plan Note (Signed)
Problem is not well controlled. Tramadol dose increased from 50 mg daily to bid. Side effects discussed. PMP reviewed.

## 2023-02-22 NOTE — Assessment & Plan Note (Signed)
On chronic anticoagulation, coumadin. Follows with cardiologist/coumadin clinic. 

## 2023-02-25 ENCOUNTER — Telehealth: Payer: Self-pay | Admitting: Family Medicine

## 2023-02-25 NOTE — Telephone Encounter (Signed)
Spoke with [name] to follow on recent visit. I saw [name] on 02/20/2023, at this time tramadol dose was increased from 50 mg daily to twice daily.  Hip pain has greatly improved, today he walked for 30 minutes outdoors.  In regard to Alzheimer's with episodes of agitation and confusion,I let her know that I did reach out to neurology, [name], and asked if we could try Risperdal 0.25 mg at nigh.  He is not taking Depakote, he was afraid of having side effects, so did not start it when prescribed. We discussed some potential side effects of Risperdal, [name] does not want to try this medication at this time. He is still having episodes of confusion at night, does not recognize his wife but for the past few days, their son, who lives in town, is passing by their house to "calm him down" and this seems to help. I will see him back on 04/03/23, his wife will let me know if a new problem presents.    [name] Martinique, MD

## 2023-02-26 ENCOUNTER — Ambulatory Visit: Payer: Medicare Other | Attending: Cardiology | Admitting: *Deleted

## 2023-02-26 DIAGNOSIS — D6859 Other primary thrombophilia: Secondary | ICD-10-CM | POA: Insufficient documentation

## 2023-02-26 DIAGNOSIS — Z5181 Encounter for therapeutic drug level monitoring: Secondary | ICD-10-CM

## 2023-02-26 LAB — POCT INR: INR: 2.5 (ref 2.0–3.0)

## 2023-02-26 NOTE — Patient Instructions (Signed)
Description   Continue taking 1/2 tablet daily EXCEPT 1 tablet on Tuesdays and Thursdays.  Stay consistent with greens each week Recheck INR in 4 weeks.  Coumadin Clinic [phone] or [phone]

## 2023-03-10 NOTE — Progress Notes (Unsigned)
Cardiology Office Note   Date:  03/12/2023   ID:  [name], [name] 08/08/1946, MRN [id number]  PCP:  Martinique, Betty G, MD    No chief complaint on file.  CAD  Wt Readings from Last 3 Encounters:  03/12/23 175 lb (79.4 kg)  02/20/23 182 lb (82.6 kg)  01/09/23 188 lb (85.3 kg)       History of Present Illness: [name] is a 77 y.o. male  who had CABG in 1996.  He was being treated for HTN at the time.  He was on vacation and was cleaning and felt severe chest pain with activity.  He failed an ETT.  He had a cath showing 3 vessel disease and had CABG x 3 in 1996 ( LIMA was used.).  Since then, he has not had a cath.  He continued rehab for an extended period of time.  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   He moved to [place] in Jan 2018 from [place].    He uses a cane and walker to walk due to instability.  He has completed a physical therapy program.   Quit smoking 35 years ago.     He was diagnosed with a hypercoagulable state at the time of CABG and he has been on Coumadin since that time.  Nose bleed when INR increased.   He was found to have a markedly elevated fibrinopeptide A level, mild resistance to activated protein C and relative aspirin resistance with respect to ADP-induced platelet aggregation.   He was seen at [facility] for his neuropathy.  He has DJD of the spine.  He goes to the chiropracter.   His biggest issue is his back.  He was referred to a massage therapist. He has had some improvement, but still has some balance problems.  He has not fallen and uses a cane.     In 2021, it was noted: " No testing since that time. Some atypical chest pain that sounds more like muscle soreness.  Not like prior angina.  We discussed a lexiscan myoview, but they want to think about it."   In 2022, He did physical therapy, stretching, lifting.  Did some water exercises with PT.     In 2023, Exertion has  dropped off.  Does some PT.   In 2024, no longer doing PT, but still doing some exercise.  No sx with exertion.    Denies : Chest pain. Dizziness. Leg edema. Nitroglycerin use. Orthopnea. Palpitations. Paroxysmal nocturnal dyspnea. Shortness of breath. Syncope.     One fall in early 2024.  Fell after tripping on a chair.    Past Medical History:  Diagnosis Date   Allergy    Arthritis    CAD (coronary artery disease) 1996   POST CABG   Cataracts, bilateral    Chicken pox    Dyslipidemia    Hyperlipidemia    Hypertension    Sinus bradycardia     Past Surgical History:  Procedure Laterality Date   BYPASS GRAFT  1996   CATARACT EXTRACTION Right    COLONOSCOPY WITH PROPOFOL N/A 11/20/2022   Procedure: COLONOSCOPY WITH PROPOFOL;  Surgeon: Jerene Bears, MD;  Location: [facility];  Service: Gastroenterology;  Laterality: N/A;   INGUINAL HERNIA REPAIR Right 04/09/2020   Procedure: LAPAROSCOPIC RIGHT INGUINAL HERNIA REPAIR WITH MESH;  Surgeon: Ralene Ok, MD;  Location: [facility];  Service: General;  Laterality: Right;  POLYPECTOMY  11/20/2022   Procedure: POLYPECTOMY;  Surgeon: Jerene Bears, MD;  [facility];  Service: Gastroenterology;;   REFRACTIVE SURGERY Right    SKIN BIOPSY     head x 2   UMBILICAL HERNIA REPAIR  2003     Current Outpatient Medications  Medication Sig Dispense Refill   amLODipine (NORVASC) 5 MG tablet TAKE ONE AND ONE-HALF TABLETS DAILY 135 tablet 3   cholecalciferol (VITAMIN D) 1000 units tablet Take 1,000 Units by mouth daily.      Cyanocobalamin (VITAMIN B12) 500 MCG TABS Take 500 mcg by mouth daily.      donepezil (ARICEPT) 10 MG tablet TAKE 1 TABLET AT BEDTIME 90 tablet 3   ezetimibe (ZETIA) 10 MG tablet Take 1 tablet (10 mg total) by mouth at bedtime. 90 tablet 3   fenofibrate (TRICOR) 145 MG tablet Take 1 tablet (145 mg total) by mouth at bedtime. 90 tablet 3   furosemide (LASIX) 20 MG tablet Take 1 tablet (20 mg total) by mouth  daily. May take an extra dose 20mg  twice a week for swelling 120 tablet 3   losartan (COZAAR) 100 MG tablet TAKE 1 TABLET DAILY 30 tablet 0   memantine (NAMENDA) 10 MG tablet Take 1 tablet (10 mg total) by mouth 2 (two) times daily. 180 tablet 1   nitroGLYCERIN (NITROSTAT) 0.4 MG SL tablet Place 1 tablet (0.4 mg total) under the tongue every 5 (five) minutes as needed for chest pain. 75 tablet 2   Olopatadine HCl (PATADAY OP) Place 1 drop into both eyes daily as needed (allergies).     rosuvastatin (CRESTOR) 40 MG tablet Take 1 tablet (40 mg total) by mouth daily. 90 tablet 3   sodium chloride (OCEAN) 0.65 % SOLN nasal spray Place 1 spray into both nostrils 2 (two) times daily as needed for congestion.     tamsulosin (FLOMAX) 0.4 MG CAPS capsule Take 0.4 mg by mouth daily after supper.     traMADol (ULTRAM) 50 MG tablet Take 1 tablet (50 mg total) by mouth every 12 (twelve) hours as needed for severe pain or moderate pain. 60 tablet 1   warfarin (COUMADIN) 5 MG tablet TAKE AS DIRECTED BY COUMADIN CLINIC (Patient taking differently: Take 5-7.5 mg by mouth See admin instructions. Take 5 mg Mon., Wed.,and Fri., all the other day take 7.5 mg  daily TAKE AS DIRECTED BY COUMADIN CLINIC) 80 tablet 3   divalproex (DEPAKOTE) 125 MG DR tablet Take 1 tab at night for 1 week, then increase to 2 tablets at night (Patient not taking: Reported on 03/12/2023) 180 tablet 3   No current facility-administered medications for this visit.    Allergies:   Other and Latex    Social History:  The patient  reports that he quit smoking about 40 years ago. His smoking use included cigarettes. He has never used smokeless tobacco. He reports current alcohol use. He reports that he does not use drugs.   Family History:  The patient's family history includes AAA (abdominal aortic aneurysm) in his sister; Alzheimer's disease in his mother; Cancer in his father; Hypertension in his maternal grandmother and paternal grandmother;  Prostate cancer in his father.    ROS:  Please see the history of present illness.   Otherwise, review of systems are positive for muscle soreness after falling.   All other systems are reviewed and negative.    PHYSICAL EXAM: VS:  BP 110/60   Pulse 60   Ht 6\' 2"  (  1.88 m)   Wt 175 lb (79.4 kg)   SpO2 95%   BMI 22.47 kg/m  , BMI Body mass index is 22.47 kg/m. GEN: Well nourished, well developed, in no acute distress HEENT: left sclera hemorrhage Neck: no JVD, carotid bruits, or masses Cardiac: RRR; no murmurs, rubs, or gallops,; bilateral ankle edema  Respiratory:  clear to auscultation bilaterally, normal work of breathing GI: soft, nontender, nondistended, + BS MS: kyphosis Skin: warm and dry, no rash Neuro:  Strength and sensation are intact Psych: euthymic mood, full affect   EKG:   The ekg ordered today demonstrates NSR, LVH   Recent Labs: 12/06/2022: BUN 14; Creatinine, Ser 0.83; Hemoglobin 13.8; Platelets 238; Potassium 3.7; Sodium 141   Lipid Panel    Component Value Date/Time   CHOL 128 11/05/2021 0845   CHOL 128 01/03/2021 0904   TRIG 47.0 11/05/2021 0845   HDL 60.60 11/05/2021 0845   HDL 56 01/03/2021 0904   CHOLHDL 2 11/05/2021 0845   VLDL 9.4 11/05/2021 0845   LDLCALC 58 11/05/2021 0845   LDLCALC 60 01/03/2021 0904     Other studies Reviewed: Additional studies/ records that were reviewed today with results demonstrating: INR at last check was 2.5.   ASSESSMENT AND PLAN:  CAD: No angina.  COntinue aggressive secondary prevention.  HTN: The current medical regimen is effective;  continue present plan and medications. Hyperlipidemia: Needs fasting lipids checked.   Anticoagulated: Hypercoagulable state. Coumadin for many years.  LE edema: Elevate legs, Compression stockings.  Can use extra diuretic.     Current medicines are reviewed at length with the patient today.  The patient concerns regarding his medicines were addressed.  The following  changes have been made:  No change  Labs/ tests ordered today include: fasting lipids No orders of the defined types were placed in this encounter.   Recommend 150 minutes/week of aerobic exercise Low fat, low carb, high fiber diet recommended  Disposition:   FU in 1 year   Signed, Larae Grooms, MD  03/12/2023 9:49 AM    [facility] [place], [place],   [address] Phone: [phone]; Fax: [phone]

## 2023-03-12 ENCOUNTER — Ambulatory Visit: Payer: Medicare Other | Attending: Interventional Cardiology | Admitting: Interventional Cardiology

## 2023-03-12 ENCOUNTER — Encounter: Payer: Self-pay | Admitting: Interventional Cardiology

## 2023-03-12 VITALS — BP 110/60 | HR 60 | Ht 74.0 in | Wt 175.0 lb

## 2023-03-12 DIAGNOSIS — E782 Mixed hyperlipidemia: Secondary | ICD-10-CM | POA: Diagnosis not present

## 2023-03-12 DIAGNOSIS — Z7901 Long term (current) use of anticoagulants: Secondary | ICD-10-CM | POA: Diagnosis not present

## 2023-03-12 DIAGNOSIS — M7989 Other specified soft tissue disorders: Secondary | ICD-10-CM | POA: Diagnosis not present

## 2023-03-12 DIAGNOSIS — I10 Essential (primary) hypertension: Secondary | ICD-10-CM | POA: Insufficient documentation

## 2023-03-12 DIAGNOSIS — I25118 Atherosclerotic heart disease of native coronary artery with other forms of angina pectoris: Secondary | ICD-10-CM | POA: Diagnosis not present

## 2023-03-12 NOTE — Patient Instructions (Signed)
Medication Instructions:  Your physician recommends that you continue on your current medications as directed. Please refer to the Current Medication list given to you today.  *If you need a refill on your cardiac medications before your next appointment, please call your pharmacy*   Lab Work: Your physician recommends that you return for lab work on March 26, 2023.  This will be fasting.  CBC, CMET, Lipids, PT/INR  If you have labs (blood work) drawn today and your tests are completely normal, you will receive your results only by: [place] (if you have MyChart) OR A paper copy in the mail If you have any lab test that is abnormal or we need to change your treatment, we will call you to review the results.   Testing/Procedures: none   Follow-Up: At [facility], you and your health needs are our priority.  As part of our continuing mission to provide you with exceptional heart care, we have created designated Provider Care Teams.  These Care Teams include your primary Cardiologist (physician) and Advanced Practice Providers (APPs -  Physician Assistants and Nurse Practitioners) who all work together to provide you with the care you need, when you need it.  We recommend signing up for the patient portal called "MyChart".  Sign up information is provided on this After Visit Summary.  MyChart is used to connect with patients for Virtual Visits (Telemedicine).  Patients are able to view lab/test results, encounter notes, upcoming appointments, etc.  Non-urgent messages can be sent to your provider as well.   To learn more about what you can do with MyChart, go to [email or web address].    Your next appointment:   12 month(s)  Provider:   Larae Grooms, MD     Other Instructions We will cancel the coumadin clinic appointment for April 18 and add PT/INR to your lab work being done at [facility] that day

## 2023-03-18 ENCOUNTER — Other Ambulatory Visit: Payer: Self-pay

## 2023-03-18 MED ORDER — LOSARTAN POTASSIUM 100 MG PO TABS: 100.0000 mg | ORAL_TABLET | Freq: Every day | ORAL | 3 refills | Status: DC

## 2023-03-26 ENCOUNTER — Ambulatory Visit: Payer: Medicare Other | Attending: Interventional Cardiology

## 2023-03-26 ENCOUNTER — Ambulatory Visit (INDEPENDENT_AMBULATORY_CARE_PROVIDER_SITE_OTHER): Payer: Medicare Other | Admitting: Cardiology

## 2023-03-26 DIAGNOSIS — E782 Mixed hyperlipidemia: Secondary | ICD-10-CM | POA: Diagnosis not present

## 2023-03-26 DIAGNOSIS — M7989 Other specified soft tissue disorders: Secondary | ICD-10-CM | POA: Diagnosis not present

## 2023-03-26 DIAGNOSIS — Z5181 Encounter for therapeutic drug level monitoring: Secondary | ICD-10-CM

## 2023-03-26 DIAGNOSIS — I10 Essential (primary) hypertension: Secondary | ICD-10-CM | POA: Diagnosis not present

## 2023-03-26 DIAGNOSIS — Z7901 Long term (current) use of anticoagulants: Secondary | ICD-10-CM | POA: Diagnosis not present

## 2023-03-26 DIAGNOSIS — I25118 Atherosclerotic heart disease of native coronary artery with other forms of angina pectoris: Secondary | ICD-10-CM | POA: Diagnosis not present

## 2023-03-26 LAB — COMPREHENSIVE METABOLIC PANEL
ALT: 25 IU/L (ref 0–44)
AST: 28 IU/L (ref 0–40)
Albumin/Globulin Ratio: 2.6 — ABNORMAL HIGH (ref 1.2–2.2)
Albumin: 4.6 g/dL (ref 3.8–4.8)
Alkaline Phosphatase: 44 IU/L (ref 44–121)
BUN/Creatinine Ratio: 11 (ref 10–24)
BUN: 9 mg/dL (ref 8–27)
Bilirubin Total: 0.8 mg/dL (ref 0.0–1.2)
CO2: 25 mmol/L (ref 20–29)
Calcium: 10.8 mg/dL — ABNORMAL HIGH (ref 8.6–10.2)
Chloride: 108 mmol/L — ABNORMAL HIGH (ref 96–106)
Creatinine, Ser: 0.83 mg/dL (ref 0.76–1.27)
Globulin, Total: 1.8 g/dL (ref 1.5–4.5)
Glucose: 105 mg/dL — ABNORMAL HIGH (ref 70–99)
Potassium: 3.9 mmol/L (ref 3.5–5.2)
Sodium: 141 mmol/L (ref 134–144)
Total Protein: 6.4 g/dL (ref 6.0–8.5)
eGFR: 91 mL/min/{1.73_m2} (ref >59)

## 2023-03-26 LAB — CBC
Hematocrit: 40.6 % (ref 37.5–51.0)
Hemoglobin: 13.7 g/dL (ref 13.0–17.7)
MCH: 31.5 pg (ref 26.6–33.0)
MCHC: 33.7 g/dL (ref 31.5–35.7)
MCV: 93 fL (ref 79–97)
Platelets: 213 10*3/uL (ref 150–450)
RBC: 4.35 x10E6/uL (ref 4.14–5.80)
RDW: 14.6 % (ref 11.6–15.4)
WBC: 3.8 10*3/uL (ref 3.4–10.8)

## 2023-03-26 LAB — PROTIME-INR
INR: 3.6 — ABNORMAL HIGH (ref 0.9–1.2)
Prothrombin Time: 34.4 s — ABNORMAL HIGH (ref 9.1–12.0)

## 2023-03-26 NOTE — Patient Instructions (Signed)
Description   Called and spoke to pt's wife and instructed pt to hold warfarin today and then continue taking 1/2 tablet daily EXCEPT 1 tablet on Tuesdays and Thursdays.  Stay consistent with greens each week Recheck INR in 3 weeks.  Coumadin Clinic [phone] or [phone]

## 2023-03-27 LAB — LIPID PANEL
Chol/HDL Ratio: 2 ratio (ref 0.0–5.0)
Cholesterol, Total: 122 mg/dL (ref 100–199)
HDL: 62 mg/dL (ref >39)
LDL Chol Calc (NIH): 47 mg/dL (ref 0–99)
Triglycerides: 56 mg/dL (ref 0–149)
VLDL Cholesterol Cal: 13 mg/dL (ref 5–40)

## 2023-03-30 ENCOUNTER — Other Ambulatory Visit: Payer: Self-pay | Admitting: Interventional Cardiology

## 2023-03-30 DIAGNOSIS — H1132 Conjunctival hemorrhage, left eye: Secondary | ICD-10-CM | POA: Diagnosis not present

## 2023-03-30 DIAGNOSIS — H40013 Open angle with borderline findings, low risk, bilateral: Secondary | ICD-10-CM | POA: Diagnosis not present

## 2023-04-01 NOTE — Progress Notes (Unsigned)
HPI: [name] is a 77 y.o. male, who is here today for chronic disease management.  Last seen on 02/20/23. Since his last visit he has followed with Coumadin clinic and he has seen his cardiologist, Dr. Eldridge Dace. His Furosemide dose was adjusted, he was instructed to take it 2 times per week to help with LE edema.  HTN on Losartan 100 mg daily and Amlodipine 5 mg daily. Negative for severe/frequent headache, visual changes, chest pain, dyspnea,or focal weakness. Lab Results  Component Value Date   CREATININE 0.83 03/26/2023   BUN 9 03/26/2023   NA 141 03/26/2023   K 3.9 03/26/2023   CL 108 (H) 03/26/2023   CO2 25 03/26/2023   Generalized OA/chronic pain: He reports taking Tramadol for pain on an as-needed basis, with an average of one to two doses daily. He states that the pain is either subsiding or he is getting used to it. He has been engaging in more walking activities, both indoors and outdoors, with the assistance of a walker when he goes for long walks. No changes in bowel habits.  Alzheimer's disease:His wife reports that episodes of agitation have decreased, and he has come to terms with his Alzheimer's diagnosis. However, he still experiences moments of not recognizing his wife. He knows to call his wife when he does not know where she is, this has helped with episodes of agitation. He continues to see people in the house who are not there, preparing food and arranging table for an extra person, for the past week he has not have major episodes. He is not taking Depakote, which was recommended by neuro. He is on Namenda 10 mg bid.Marland Kitchen  His sleep is described as poor, with only three to four hours of sleep per night and occasional short naps during the day. Melatonin has not been effective in improving his sleep. He experiences urinary frequency during the night, which also contributes to sleep interruption.  Review of Systems  Constitutional:  Positive for fatigue.  Negative for chills and fever.  Respiratory:  Negative for cough and wheezing.   Gastrointestinal:  Negative for abdominal pain, nausea and vomiting.  Endocrine: Negative for cold intolerance and heat intolerance.  Genitourinary:  Negative for decreased urine volume, dysuria and hematuria.  Skin:  Negative for rash.  Neurological:  Negative for syncope and facial asymmetry.  See other pertinent positives and negatives in HPI.  Current Outpatient Medications on File Prior to Visit  Medication Sig Dispense Refill   amLODipine (NORVASC) 5 MG tablet TAKE ONE AND ONE-HALF TABLETS DAILY 135 tablet 3   cholecalciferol (VITAMIN D) 1000 units tablet Take 1,000 Units by mouth daily.      Cyanocobalamin (VITAMIN B12) 500 MCG TABS Take 500 mcg by mouth daily.      divalproex (DEPAKOTE) 125 MG DR tablet Take 1 tab at night for 1 week, then increase to 2 tablets at night 180 tablet 3   donepezil (ARICEPT) 10 MG tablet TAKE 1 TABLET AT BEDTIME 90 tablet 3   ezetimibe (ZETIA) 10 MG tablet Take 1 tablet (10 mg total) by mouth at bedtime. 90 tablet 3   fenofibrate (TRICOR) 145 MG tablet TAKE 1 TABLET AT BEDTIME 90 tablet 3   furosemide (LASIX) 20 MG tablet Take 1 tablet (20 mg total) by mouth daily. May take an extra dose 20mg  twice a week for swelling 120 tablet 3   losartan (COZAAR) 100 MG tablet Take 1 tablet (100 mg total) by mouth daily.  90 tablet 3   memantine (NAMENDA) 10 MG tablet Take 1 tablet (10 mg total) by mouth 2 (two) times daily. 180 tablet 1   nitroGLYCERIN (NITROSTAT) 0.4 MG SL tablet Place 1 tablet (0.4 mg total) under the tongue every 5 (five) minutes as needed for chest pain. 75 tablet 2   Olopatadine HCl (PATADAY OP) Place 1 drop into both eyes daily as needed (allergies).     rosuvastatin (CRESTOR) 40 MG tablet Take 1 tablet (40 mg total) by mouth daily. 90 tablet 3   sodium chloride (OCEAN) 0.65 % SOLN nasal spray Place 1 spray into both nostrils 2 (two) times daily as needed for  congestion.     tamsulosin (FLOMAX) 0.4 MG CAPS capsule Take 0.4 mg by mouth daily after supper.     traMADol (ULTRAM) 50 MG tablet Take 1 tablet (50 mg total) by mouth every 12 (twelve) hours as needed for severe pain or moderate pain. 60 tablet 1   warfarin (COUMADIN) 5 MG tablet TAKE AS DIRECTED BY COUMADIN CLINIC (Patient taking differently: Take 5-7.5 mg by mouth See admin instructions. Take 5 mg Mon., Wed.,and Fri., all the other day take 7.5 mg  daily TAKE AS DIRECTED BY COUMADIN CLINIC) 80 tablet 3   No current facility-administered medications on file prior to visit.    Past Medical History:  Diagnosis Date   Allergy    Arthritis    CAD (coronary artery disease) 1996   POST CABG   Cataracts, bilateral    Chicken pox    Dyslipidemia    Hyperlipidemia    Hypertension    Sinus bradycardia    Allergies  Allergen Reactions   Other     Environmental allergies   Latex     Itchy hands    Social History   Socioeconomic History   Marital status: Married    Spouse [name]   Number of children: 2   Years of education: COLLEGE   Highest education level: Bachelor's degree (e.g., BA, AB, BS)  Occupational History   Occupation: RETIRED  Tobacco Use   Smoking status: Former    Types: Cigarettes    Quit date: 05/29/1982    Years since quitting: 40.8   Smokeless tobacco: Never  Vaping Use   Vaping Use: Never used  Substance and Sexual Activity   Alcohol use: Yes    Comment: 0-2 drinks a night   Drug use: No   Sexual activity: Not on file  Other Topics Concern   Not on file  Social History Narrative   Right handed   Caffeine   One story home lives with wife   retired   Chief Executive Officer Determinants of Corporate investment banker Strain: Low Risk  (12/24/2022)   Overall Financial Resource Strain (CARDIA)    Difficulty of Paying Living Expenses: Not hard at all  Food Insecurity: No Food Insecurity (12/24/2022)   Hunger Vital Sign    Worried About Running Out of Food in  the Last Year: Never true    Ran Out of Food in the Last Year: Never true  Transportation Needs: No Transportation Needs (12/24/2022)   PRAPARE - Administrator, Civil Service (Medical): No    Lack of Transportation (Non-Medical): No  Physical Activity: Inactive (12/24/2022)   Exercise Vital Sign    Days of Exercise per Week: 0 days    Minutes of Exercise per Session: 0 min  Stress: No Stress Concern Present (12/24/2022)   [employer] of  Occupational Health - Occupational Stress Questionnaire    Feeling of Stress : Not at all  Recent Concern: Stress - Stress Concern Present (10/23/2022)   [employer] of Occupational Health - Occupational Stress Questionnaire    Feeling of Stress : To some extent  Social Connections: Moderately Isolated (12/24/2022)   Social Connection and Isolation Panel [NHANES]    Frequency of Communication with Friends and Family: More than three times a week    Frequency of Social Gatherings with Friends and Family: More than three times a week    Attends Religious Services: Never    Database administrator or Organizations: No    Attends Banker Meetings: Never    Marital Status: Married   Vitals:   04/03/23 1358  BP: 128/72  Pulse: 75  Resp: 16  SpO2: 98%   Body mass index is 22.77 kg/m.  Physical Exam Vitals and nursing note reviewed.  Constitutional:      General: He is not in acute distress.    Appearance: He is well-developed.  HENT:     Head: Normocephalic and atraumatic.  Eyes:     Conjunctiva/sclera: Conjunctivae normal.  Cardiovascular:     Rate and Rhythm: Normal rate and regular rhythm.     Heart sounds: No murmur heard. Pulmonary:     Effort: Pulmonary effort is normal. No respiratory distress.     Breath sounds: Normal breath sounds.  Abdominal:     Palpations: Abdomen is soft.     Tenderness: There is no abdominal tenderness.  Musculoskeletal:     Cervical back: Decreased range of motion.      Thoracic back: Deformity (kyphosis) present.     Right lower leg: 2+ Pitting Edema present.     Left lower leg: 2+ Pitting Edema present.     Comments: Shoulder with no significant limitation of ROM, no pain elicited.  Skin:    General: Skin is warm.     Findings: No erythema or rash.  Neurological:     Mental Status: He is alert. Mental status is at baseline.     Comments: Unstable, antalgic gait, assisted with a cane.  Psychiatric:        Mood and Affect: Mood and affect normal.   ASSESSMENT AND PLAN:  Mr. Lisandro was seen today for follow-up.  Diagnoses and all orders for this visit:  Generalized osteoarthritis of multiple sites Assessment & Plan: Pain has been better controlled. We discussed some side effects of medication, he has tolerated well. Continue Tramadol 50 mg bid prn. PMP reviewed. F/U in 5 months, before if needed.   Essential hypertension Assessment & Plan: BP is adequately controlled. Continue losartan 100 mg daily and Amlodipine 5 mg daily as well as low salt diet.   Mild neurocognitive disorder due to Alzheimer's disease [facility]) Assessment & Plan: Behavior has improved in the past week, occasional hallucinations and confusion at night but he an his wife have developed strategies that helped with these episodes. He does not want to try Depakote at this time. He has an appt with neuro in 07/2023.   Bilateral lower extremity edema Assessment & Plan: His cardiologist has adjusted Furosemide dose, 2 times per week.   Return in about 5 months (around 09/03/2023) for chronic problems.  Sonia Stickels G. [place], MD  [facility]. [place] office.

## 2023-04-03 ENCOUNTER — Encounter: Payer: Self-pay | Admitting: Family Medicine

## 2023-04-03 ENCOUNTER — Ambulatory Visit (INDEPENDENT_AMBULATORY_CARE_PROVIDER_SITE_OTHER): Payer: Medicare Other | Admitting: Family Medicine

## 2023-04-03 VITALS — BP 128/72 | HR 75 | Resp 16 | Ht 74.0 in | Wt 177.4 lb

## 2023-04-03 DIAGNOSIS — I10 Essential (primary) hypertension: Secondary | ICD-10-CM

## 2023-04-03 DIAGNOSIS — R600 Localized edema: Secondary | ICD-10-CM

## 2023-04-03 DIAGNOSIS — M159 Polyosteoarthritis, unspecified: Secondary | ICD-10-CM | POA: Diagnosis not present

## 2023-04-03 DIAGNOSIS — G309 Alzheimer's disease, unspecified: Secondary | ICD-10-CM | POA: Diagnosis not present

## 2023-04-03 DIAGNOSIS — F0670 Mild neurocognitive disorder due to known physiological condition without behavioral disturbance: Secondary | ICD-10-CM | POA: Diagnosis not present

## 2023-04-03 NOTE — Assessment & Plan Note (Signed)
Pain has been better controlled. We discussed some side effects of medication, he has tolerated well. Continue Tramadol 50 mg bid prn. PMP reviewed. F/U in 5 months, before if needed.

## 2023-04-03 NOTE — Patient Instructions (Addendum)
A few things to remember from today's visit:  No changes today. Continue Tramadol 50 mg 2 times daily as needed. Fall precautions. I will see you back in 08/2023, before if needed.  If you need refills for medications you take chronically, please call your pharmacy. Do not use My Chart to request refills or for acute issues that need immediate attention. If you send a my chart message, it may take a few days to be addressed, specially if I am not in the office.  Please be sure medication list is accurate. If a new problem present, please set up appointment sooner than planned today.

## 2023-04-03 NOTE — Assessment & Plan Note (Signed)
His cardiologist has adjusted Furosemide dose, 2 times per week.

## 2023-04-03 NOTE — Assessment & Plan Note (Signed)
BP is adequately controlled. Continue losartan 100 mg daily and Amlodipine 5 mg daily as well as low salt diet.

## 2023-04-03 NOTE — Assessment & Plan Note (Signed)
Behavior has improved in the past week, occasional hallucinations and confusion at night but he an his wife have developed strategies that helped with these episodes. He does not want to try Depakote at this time. He has an appt with neuro in 07/2023.

## 2023-04-14 ENCOUNTER — Other Ambulatory Visit: Payer: Self-pay

## 2023-04-14 DIAGNOSIS — I83029 Varicose veins of left lower extremity with ulcer of unspecified site: Secondary | ICD-10-CM

## 2023-04-14 DIAGNOSIS — L97929 Non-pressure chronic ulcer of unspecified part of left lower leg with unspecified severity: Secondary | ICD-10-CM

## 2023-04-16 ENCOUNTER — Ambulatory Visit: Payer: Medicare Other | Attending: Cardiology

## 2023-04-16 DIAGNOSIS — Z5181 Encounter for therapeutic drug level monitoring: Secondary | ICD-10-CM | POA: Diagnosis not present

## 2023-04-16 DIAGNOSIS — D6859 Other primary thrombophilia: Secondary | ICD-10-CM

## 2023-04-16 LAB — POCT INR: INR: 2.2 (ref 2.0–3.0)

## 2023-04-16 NOTE — Patient Instructions (Signed)
Description   Continue taking 1/2 tablet daily EXCEPT 1 tablet on Tuesdays and Thursdays.  Stay consistent with greens each week Recheck INR in 4 weeks.  Coumadin Clinic [phone] or [phone]     

## 2023-04-28 ENCOUNTER — Encounter (HOSPITAL_BASED_OUTPATIENT_CLINIC_OR_DEPARTMENT_OTHER): Payer: Medicare Other | Attending: General Surgery | Admitting: General Surgery

## 2023-04-28 DIAGNOSIS — I10 Essential (primary) hypertension: Secondary | ICD-10-CM | POA: Insufficient documentation

## 2023-04-28 DIAGNOSIS — I872 Venous insufficiency (chronic) (peripheral): Secondary | ICD-10-CM | POA: Insufficient documentation

## 2023-04-28 DIAGNOSIS — R600 Localized edema: Secondary | ICD-10-CM | POA: Diagnosis not present

## 2023-04-28 DIAGNOSIS — F02818 Dementia in other diseases classified elsewhere, unspecified severity, with other behavioral disturbance: Secondary | ICD-10-CM | POA: Diagnosis not present

## 2023-04-28 DIAGNOSIS — I8393 Asymptomatic varicose veins of bilateral lower extremities: Secondary | ICD-10-CM | POA: Diagnosis not present

## 2023-04-28 DIAGNOSIS — G301 Alzheimer's disease with late onset: Secondary | ICD-10-CM | POA: Insufficient documentation

## 2023-04-28 NOTE — Progress Notes (Signed)
ROBLEY, DUTTON ([id number]) [id number] Page 1 of 4 Visit Report for 04/28/2023 Abuse Risk Screen Details Patient Name: Date of Service: [name], [name] 04/28/2023 1:30 PM Medical Record Number: [id number] Patient Account Number: [id number] Date of Birth/Sex: Treating RN: [birth year] (77 y.o. Marlan Palau Primary Care Audrick Lamoureaux: [place], Betty Other Clinician: Referring Lakeisha Waldrop: Treating Quinlan Mcfall/Extender: Cannon, Jennifer [place], Betty Weeks in Treatment: 0 Abuse Risk Screen Items Answer ABUSE RISK SCREEN: Has anyone close to you tried to hurt or harm you recentlyo No Do you feel uncomfortable with anyone in your familyo No Has anyone forced you do things that you didnt want to doo No Electronic Signature(s) Signed: 04/28/2023 3:55:34 PM By: Samuella Bruin Entered By: Samuella Bruin on 04/28/2023 13:38:46 -------------------------------------------------------------------------------- Activities of Daily Living Details Patient Name: Date of Service: [name], [name] 04/28/2023 1:30 PM Medical Record Number: [id number] Patient Account Number: [id number] Date of Birth/Sex: Treating RN: 05-14-1946 (77 y.o. Marlan Palau Primary Care Lindley Stachnik: [place], Betty Other Clinician: Referring Mallisa Alameda: Treating Lovina Zuver/Extender: Cannon, Jennifer [place], Betty Weeks in Treatment: 0 Activities of Daily Living Items Answer Activities of Daily Living (Please select one for each item) Drive Automobile Not Able T Medications ake Completely Able Use T elephone Completely Able Care for Appearance Completely Able Use T oilet Completely Able Bath / Shower Completely Able Dress Self Completely Able Feed Self Completely Able Walk Need Assistance Get In / Out Bed Completely Able Housework Need Assistance Prepare Meals Need Assistance Handle Money Need Assistance Shop for Self Need Assistance Electronic Signature(s) Signed:  04/28/2023 3:55:34 PM By: Samuella Bruin Entered By: Samuella Bruin on 04/28/2023 13:39:15 -------------------------------------------------------------------------------- Education Screening Details Patient Name: Date of Service: [name] 04/28/2023 1:30 PM Medical Record Number: [id number] Patient Account Number: [id number] Date of Birth/Sex: Treating RN: [birth year] (77 y.o. Marlan Palau Primary Care Endia Moncur: [place], Betty Other Clinician: Referring Nyaisha Simao: Treating Margit Batte/Extender: Cannon, Jennifer [place], Betty Weeks in Treatment: [address] [name], [name] ([id number]) [id number] Page 2 of 4 Primary Learner Assessed: Patient Learning Preferences/Education Level/Primary Language Learning Preference: Explanation, Demonstration, Video, Printed Material Highest Education Level: College or Above Preferred Language: Economist Language Barrier: No Translator Needed: No Memory Deficit: No Emotional Barrier: No Cultural/Religious Beliefs Affecting Medical Care: No Physical Barrier Impaired Vision: No Impaired Hearing: No Decreased Hand dexterity: No Knowledge/Comprehension Knowledge Level: Medium Comprehension Level: Medium Ability to understand written instructions: Medium Ability to understand verbal instructions: Medium Motivation Anxiety Level: Calm Cooperation: Cooperative Education Importance: Acknowledges Need Interest in Health Problems: Asks Questions Perception: Coherent Willingness to Engage in Self-Management Medium Activities: Readiness to Engage in Self-Management Medium Activities: Electronic Signature(s) Signed: 04/28/2023 3:55:34 PM By: Samuella Bruin Entered By: Samuella Bruin on 04/28/2023 13:39:45 -------------------------------------------------------------------------------- Fall Risk Assessment Details Patient Name: Date of Service: [name] 04/28/2023  1:30 PM Medical Record Number: [id number] Patient Account Number: [id number] Date of Birth/Sex: Treating RN: 01-02-1946 (77 y.o. Marlan Palau Primary Care Cedarius Kersh: [place], Betty Other Clinician: Referring Jarry Manon: Treating Dajour Pierpoint/Extender: Cannon, Jennifer [place], Betty Weeks in Treatment: 0 Fall Risk Assessment Items Have you had 2 or more falls in the last 12 monthso 0 Yes Have you had any fall that resulted in injury in the last 12 monthso 0 No FALLS RISK SCREEN History of falling - immediate or within 3 months 25 Yes Secondary diagnosis (Do you have 2 or more medical diagnoseso) 0 No Ambulatory aid None/bed rest/wheelchair/nurse 0 No Crutches/cane/walker 15 Yes Furniture 0 No Intravenous therapy Access/Saline/Heparin Lock 0  No Gait/Transferring Normal/ bed rest/ wheelchair 0 Yes Weak (short steps with or without shuffle, stooped but able to lift head while walking, may seek 10 Yes support from furniture) Impaired (short steps with shuffle, may have difficulty arising from chair, head down, impaired 20 Yes balance) Mental Status Oriented to own ability 0 Yes Overestimates or forgets limitations 0 No Risk Level: High Risk Score: 66 [name], [name] ([id number]) [phone] [id number] Page 3 of 4 Electronic Signature(s) -------------------------------------------------------------------------------- Foot Assessment Details Patient Name: Date of Service: [name], [name] 04/28/2023 1:30 PM Medical Record Number: [id number] Patient Account Number: [id number] Date of Birth/Sex: Treating RN: May 28, 1946 (77 y.o. Marlan Palau Primary Care Leyanna Bittman: [place], Betty Other Clinician: Referring Mareon Robinette: Treating Cecilee Rosner/Extender: Cannon, Jennifer [place], Betty Weeks in Treatment: 0 Foot Assessment Items Site Locations + = Sensation present, - = Sensation absent, C = Callus, U = Ulcer R = Redness, W = Warmth, M = Maceration, PU =  Pre-ulcerative lesion F = Fissure, S = Swelling, D = Dryness Assessment Right: Left: Other Deformity: No No Prior Foot Ulcer: No No Prior Amputation: No No Charcot Joint: No No Ambulatory Status: Ambulatory With Help Assistance Device: Cane Gait: Steady Notes no hx or diabetes or neuropathy Electronic Signature(s) Signed: 04/28/2023 3:55:34 PM By: Samuella Bruin Entered By: Samuella Bruin on 04/28/2023 13:40:56 -------------------------------------------------------------------------------- Nutrition Risk Screening Details Patient Name: Date of Service: [name], [name] 04/28/2023 1:30 PM Medical Record Number: [id number] Patient Account Number: [id number] Date of Birth/Sex: Treating RN: [birth year] (77 y.o. Marlan Palau Primary Care Jaylon Boylen: [place], Betty Other Clinician: Referring Kariann Wecker: Treating Wynn Kernes/Extender: Cannon, Jennifer [place], Betty Weeks in Treatment: 0 Height (in): 73 Weight (lbs): 178 Body Mass Index (BMI): 23.5 [name], [name] ([id number]) [phone] [id number] Page 4 of 4 Nutrition Risk Screening Items Score Screening NUTRITION RISK SCREEN: I have an illness or condition that made me change the kind and/or amount of food I eat 0 No I eat fewer than two meals per day 0 No I eat few fruits and vegetables, or milk products 0 No I have three or more drinks of beer, liquor or wine almost every day 0 No I have tooth or mouth problems that make it hard for me to eat 0 No I don't always have enough money to buy the food I need 0 No I eat alone most of the time 0 No I take three or more different prescribed or over-the-counter drugs a day 1 Yes Without wanting to, I have lost or gained 10 pounds in the last six months 0 No I am not always physically able to shop, cook and/or feed myself 2 Yes Nutrition Protocols Good Risk Protocol Moderate Risk Protocol 0 Provide education on nutrition High Risk Proctocol Risk  Level: Moderate Risk Score: 3 Electronic Signature(s) Signed: 04/28/2023 3:55:34 PM By: Samuella Bruin Entered By: Samuella Bruin on 04/28/2023 13:40:40

## 2023-05-01 ENCOUNTER — Other Ambulatory Visit: Payer: Self-pay | Admitting: Interventional Cardiology

## 2023-05-05 ENCOUNTER — Ambulatory Visit (INDEPENDENT_AMBULATORY_CARE_PROVIDER_SITE_OTHER): Payer: Medicare Other | Admitting: Podiatry

## 2023-05-05 DIAGNOSIS — M7742 Metatarsalgia, left foot: Secondary | ICD-10-CM

## 2023-05-05 DIAGNOSIS — L84 Corns and callosities: Secondary | ICD-10-CM

## 2023-05-05 DIAGNOSIS — M7741 Metatarsalgia, right foot: Secondary | ICD-10-CM

## 2023-05-05 NOTE — Progress Notes (Signed)
  Subjective:  Patient ID: [name], male    DOB: 10-24-1946,  MRN: [id number]  Chief Complaint  Patient presents with   Callouses    Left foot plantar foot pain, large painful callus     77 y.o. male presents with the above complaint. History confirmed with patient.   Objective:  Physical Exam: warm, good capillary refill, no trophic changes or ulcerative lesions, normal DP and PT pulses, normal sensory exam, and prominent second metatarsal head left with plantar callus, the right second metatarsal head is also prominent but there is no callus.  Assessment:   1. Callus of foot   2. Metatarsalgia of both feet      Plan:  Patient was evaluated and treated and all questions answered.  Discussed the foot shape and position is contributing to plantar callus formation and metatarsalgia.  I recommended offloading with a metatarsal pad and this was applied to his shoes.  He has appropriate shoes that are supportive.  The metatarsal was applied.  Callus was debrided in thickness with a sharp scalpel to a tolerable level.  He will return as needed for this.  Return if symptoms worsen or fail to improve.

## 2023-05-05 NOTE — Patient Instructions (Signed)
Look for urea 40% cream or ointment and apply to the thickened dry skin / calluses. This can be bought over the counter, at a pharmacy or online such as [employer].  

## 2023-05-14 ENCOUNTER — Ambulatory Visit: Payer: Medicare Other | Attending: Cardiology

## 2023-05-14 DIAGNOSIS — D6859 Other primary thrombophilia: Secondary | ICD-10-CM

## 2023-05-14 DIAGNOSIS — Z5181 Encounter for therapeutic drug level monitoring: Secondary | ICD-10-CM

## 2023-05-14 LAB — POCT INR: INR: 4.7 — AB (ref 2.0–3.0)

## 2023-05-14 NOTE — Patient Instructions (Signed)
Description   HOLD today's dose and tomorrow's dose and then resume taking 1/2 tablet daily EXCEPT 1 tablet on Tuesdays and Thursdays.  Stay consistent with greens each week Recheck INR in 1 week.  Coumadin Clinic [phone] or [phone]

## 2023-05-18 ENCOUNTER — Other Ambulatory Visit: Payer: Self-pay | Admitting: Interventional Cardiology

## 2023-05-22 ENCOUNTER — Ambulatory Visit: Payer: Medicare Other | Attending: Interventional Cardiology

## 2023-05-22 DIAGNOSIS — Z5181 Encounter for therapeutic drug level monitoring: Secondary | ICD-10-CM

## 2023-05-22 DIAGNOSIS — D6859 Other primary thrombophilia: Secondary | ICD-10-CM | POA: Diagnosis not present

## 2023-05-22 LAB — POCT INR: INR: 2.2 (ref 2.0–3.0)

## 2023-05-22 NOTE — Patient Instructions (Signed)
Continue taking 1/2 tablet daily EXCEPT 1 tablet on Tuesdays and Thursdays.  Stay consistent with greens each week Recheck INR in 3 weeks.  Coumadin Clinic [phone] or [phone]

## 2023-06-08 NOTE — Progress Notes (Signed)
[name], Lloyd ([id number]) [id number] Page 1 of 8 Visit Report for 04/28/2023 Chief Complaint Document Details Patient Name: Date of Service: [name], Lloyd 04/28/2023 1:30 PM Medical Record Number: [id number] Patient Account Number: [id number] Date of Birth/Sex: Treating RN: Apr 11, 1946 (77 y.o. M) Primary Care Provider: [place], Timothy Other Clinician: Referring Provider: Treating Provider/Extender: [name] Lloyd [name] Lloyd Weeks in Treatment: 0 Information Obtained from: Patient Chief Complaint Patient presents to the wound care center due with non-wound condition(s) Electronic Signature(s) Signed: 04/28/2023 2:12:48 PM By: Timothy Guess MD FACS Previous Signature: 04/28/2023 1:41:33 PM Version By: Timothy Guess MD FACS Entered By: [name] Lloyd on 04/28/2023 14:12:48 -------------------------------------------------------------------------------- HPI Details Patient Name: Date of Service: [name] Lloyd 04/28/2023 1:30 PM Medical Record Number: [id number] Patient Account Number: [id number] Date of Birth/Sex: Treating RN: 12-23-1945 (77 y.o. M) Primary Care Provider: [place], Timothy Other Clinician: Referring Provider: Treating Provider/Extender: [name] Lloyd [name] Lloyd Weeks in Treatment: 0 History of Present Illness HPI Description: CONSULTATION ONLY 04/28/2023 This is a 77 year old man with a past medical history significant for coronary artery disease, hypertension, Alzheimer's disease, and a hypercoagulable state on chronic anticoagulation. He has had issues with bilateral lower extremity edema and is on Lasix. He had a left leg ulcer which his wife, who is a Engineer, civil (consulting), treated with silver alginate and a Tegaderm. This healed. He recently had a blister on his right lower extremity, but the time from his referral to actually getting his appointment with [place] has resulted in the wound healing. Electronic  Signature(s) Signed: 04/28/2023 2:13:17 PM By: Timothy Guess MD FACS Entered By: [name] Lloyd on 04/28/2023 14:13:17 -------------------------------------------------------------------------------- Physical Exam Details Patient Name: Date of Service: [name] Lloyd 04/28/2023 1:30 PM Medical Record Number: [id number] Patient Account Number: [id number] Date of Birth/Sex: Treating RN: [birth year] (77 y.o. M) Primary Care Provider: [place], Timothy Other Clinician: KROSBY, Lloyd ([id number]) [id number] Page 2 of 8 Referring Provider: Treating Provider/Extender: [name] Lloyd [name] Lloyd Weeks in Treatment: 0 Constitutional Hypertensive, asymptomatic. . . . No acute distress. Respiratory Normal work of breathing on room air. Cardiovascular 4+ pitting edema at the ankles with 2+ at the shins. Notes 04/28/2023: Evidence of a recently healed blister on the right anterior tibial surface. No open wounds at this time. Electronic Signature(s) Signed: 04/28/2023 2:14:08 PM By: Timothy Guess MD FACS Entered By: [name] Lloyd on 04/28/2023 14:14:08 -------------------------------------------------------------------------------- Physician Orders Details Patient Name: Date of Service: [name] Lloyd 04/28/2023 1:30 PM Medical Record Number: [id number] Patient Account Number: [id number] Date of Birth/Sex: Treating RN: [birth year] (77 y.o. 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 Avoid standing for long periods of time. Patient to wear own compression stockings every day. Moisturize legs daily. Patient Medications llergies: latex A Notifications Medication Indication Start End 04/28/2023 lidocaine DOSE topical 4 % cream -  cream topical Electronic Signature(s) Signed: 04/28/2023 2:15:23 PM By: Timothy Guess MD FACS Entered By: [name] Lloyd on 04/28/2023 14:15:23 [name] Lloyd ([id number]) [id number] Page 3 of 8 -------------------------------------------------------------------------------- Problem List Details Patient Name: Date of Service: [name], Lloyd 04/28/2023 1:30 PM Medical Record Number: [id number] Patient Account Number: [id number] Date of Birth/Sex: Treating RN: 12/27/1945 (77 y.o. 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 04/28/2023 No Yes F02.818 Dementia in other diseases classified elsewhere, unspecified severity, with 04/28/2023 No Yes other behavioral disturbance Inactive Problems Resolved Problems Electronic Signature(s) Signed: 04/28/2023 2:12:30 PM By:  Timothy Guess MD FACS Previous Signature: 04/28/2023 1:41:20 PM Version By: Timothy Guess MD FACS Entered By: [name] Lloyd on 04/28/2023 14:12:29 -------------------------------------------------------------------------------- Progress Note Details Patient Name: Date of Service: [name] Lloyd 04/28/2023 1:30 PM Medical Record Number: [id number] Patient Account Number: [id number] Date of Birth/Sex: Treating RN: 1946-10-07 (77 y.o. M) Primary Care Provider: [place], Timothy Other Clinician: Referring Provider: Treating Provider/Extender: Daria Mcmeekin [name] Lloyd Weeks in Treatment: [address] [name], Lloyd ([id number]) [id number] Page 4 of 8 Subjective Chief Complaint Information obtained from Patient Patient presents to the wound care center due with non-wound condition(s) History of Present Illness (HPI) CONSULTATION ONLY 04/28/2023 This is a 77 year old man with a past medical history significant for coronary artery disease, hypertension, Alzheimer's disease, and a hypercoagulable state on chronic anticoagulation. He has had issues with bilateral lower extremity edema and is on Lasix. He had a left leg ulcer which his wife, who is a Engineer, civil (consulting), treated with silver alginate and a Tegaderm. This healed. He recently had a blister on his right lower extremity, but the time from his referral to actually getting his appointment with [place] has resulted in the wound healing. Patient History Allergies latex Family History Cancer, Lung Disease, No family history of Diabetes, Heart Disease, Hereditary Spherocytosis, Hypertension, Kidney Disease, Seizures, Stroke, Thyroid Problems, Tuberculosis. Social History Former smoker - quit at 37 yrs, Marital Status - Married, Alcohol Use - Daily - a glass a day, Drug Use - No History, Caffeine Use - Daily. Medical History Eyes Patient has history of Cataracts Cardiovascular Patient has history of Arrhythmia -  bradycardia, Coronary Artery Disease, Hypertension, Myocardial Infarction, Peripheral Venous Disease Musculoskeletal Patient has history of Osteoarthritis Hospitalization/Surgery History - Colonoscopy with propofol. - Polypectomy. - Inguinal hernia repair (Right). - Umbilical hernia repair. - Bypass graft. - Cataract extraction (Right). - Refractive surgery (Right). - skin biopsy. 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 Ear/Nose/Mouth/Throat Complains or has symptoms of Chronic sinus problems or rhinitis. Respiratory Denies complaints or symptoms of Chronic or frequent coughs, Shortness of Breath. Gastrointestinal Denies complaints or symptoms of Frequent diarrhea, Nausea, Vomiting. Endocrine Denies complaints or symptoms of Heat/cold intolerance. Integumentary (Skin) Complains or has symptoms of Wounds. Musculoskeletal Complains or has symptoms of Muscle Weakness. Psychiatric Denies complaints or symptoms of Claustrophobia. Objective Constitutional Hypertensive, asymptomatic. No acute distress. Vitals  Time Taken: 1:34 PM, Height: 73 in, Source: Stated, Weight: 178 lbs, Source: Stated, BMI: 23.5, Temperature: 98.6 F, Pulse: 62 bpm, Respiratory Rate: 16 breaths/min, Blood Pressure: 154/78 mmHg. Respiratory Normal work of breathing on room air. Cardiovascular 4+ pitting edema at the ankles with 2+ at the shins. [name], Lloyd ([id number]) [id number] Page 5 of 8 General Notes: 04/28/2023: Evidence of a recently healed blister on the right anterior tibial surface. No open  wounds at this time. Assessment Active Problems ICD-10 Venous insufficiency (chronic) (peripheral) Essential (primary) hypertension Asymptomatic varicose veins of bilateral lower extremities Localized edema Alzheimer's disease with late onset Dementia in other diseases classified elsewhere, unspecified severity, with other behavioral disturbance Plan Discharge From [facility] Services: Discharge from Wound Care Center Anesthetic: (In clinic) Topical Lidocaine 4% applied to wound bed Edema Control - Lymphedema / SCD / Other: Elevate legs to the level of the heart or above for 30 minutes daily and/or when sitting for 3-4 times a day throughout the day. Avoid standing for long periods of time. Patient to wear own compression stockings every day. Moisturize legs daily. The following medication(s) was prescribed: lidocaine topical 4 % cream cream topical was prescribed at facility 04/28/2023: He has no open wounds today. He does have compression stockings, but does not wear them religiously. We discussed the importance of wearing them on a daily basis, from the time he gets up in the morning until he goes to bed at night. He should elevate his legs is much as possible throughout the day and at night while he is sleeping. He should limit the amount of time that he is standing stationary; he can walk is much as he is able. He should take his Lasix as prescribed. He should use a good moisturizing lotion to keep the skin on his legs pliable. At this time, he does not have any wound care needs but he may return at any time in the future should the need arise. Electronic Signature(s) Signed: 06/08/2023 2:48:10 PM By: Pearletha Alfred Signed: 06/08/2023 2:55:18 PM By: Timothy Guess MD FACS Previous Signature: 04/28/2023 2:16:54 PM Version By: Timothy Guess MD FACS Entered By: Pearletha Alfred on 06/08/2023 14:48:10 -------------------------------------------------------------------------------- HxROS  Details Patient Name: Date of Service: [name] Lloyd 04/28/2023 1:30 PM Medical Record Number: [id number] Patient Account Number: [id number] Date of Birth/Sex: Treating RN: [birth year] (77 y.o. [name] Lloyd Primary Care Provider: [place], Timothy Other Clinician: Referring Provider: Treating Provider/Extender: Makaylah Oddo [name] Lloyd Weeks in Treatment: 0 Constitutional Symptoms (General Health) Complaints and Symptoms: Negative for: Fatigue; Fever; Chills; Marked Weight Change Ear/Nose/Mouth/Throat Complaints and Symptoms: Positive for: Chronic sinus problems or rhinitis [name], Lloyd ([id number]) [id number] Page 6 of 8 Respiratory Complaints and Symptoms: Negative for: Chronic or frequent coughs; Shortness of Breath Gastrointestinal Complaints and Symptoms: Negative for: Frequent diarrhea; Nausea; Vomiting Endocrine Complaints and Symptoms: Negative for: Heat/cold intolerance Integumentary (Skin) Complaints and Symptoms: Positive for: Wounds Musculoskeletal Complaints and Symptoms: Positive for: Muscle Weakness Medical History: Positive for: Osteoarthritis Psychiatric Complaints and Symptoms: Negative for: Claustrophobia Eyes Medical History: Positive for: Cataracts Past Medical History Notes: hypertensive retinopathy Hematologic/Lymphatic Medical History: Past Medical History Notes: primary hypercoagulable state Cardiovascular Medical History: Positive for: Arrhythmia - bradycardia; Coronary Artery Disease; Hypertension; Myocardial Infarction; Peripheral Venous Disease Past Medical History Notes: varicose veins Genitourinary Medical History: Past Medical History Notes: urinary retention Immunological Neurologic Medical History: Past Medical History Notes: 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 Oncologic HBO Extended History  Items Eyes:  Cataracts Immunizations Pneumococcal Vaccine: Received Pneumococcal Vaccination: Yes Received Pneumococcal Vaccination On or After [address][name], Lloyd ([id number]) [id number] Page 7 of 8 Implantable Devices No devices added Hospitalization / Surgery History Type of Hospitalization/Surgery Colonoscopy with propofol Polypectomy Inguinal hernia repair (Right) Umbilical hernia repair Bypass graft Cataract extraction (Right) Refractive surgery (Right) skin biopsy Family and Social History Cancer: Yes; Diabetes: No; Heart Disease: No; Hereditary Spherocytosis: No; Hypertension: No; Kidney Disease: No; Lung Disease: Yes; Seizures: No; Stroke: No; Thyroid Problems: No; Tuberculosis: No; Former smoker - quit at 37 yrs; Marital Status - Married; Alcohol Use: Daily - a glass a day; Drug Use: No History; Caffeine Use: Daily; Financial Concerns: No; Food, Clothing or Shelter Needs: No; Support System Lacking: No; Transportation Concerns: No Electronic Signature(s) Signed: 04/28/2023 2:38:46 PM By: Timothy Guess MD FACS Signed: 04/28/2023 3:55:34 PM By: Gelene Mink By: Samuella Bruin on 04/28/2023 13:38:41 -------------------------------------------------------------------------------- SuperBill Details Patient Name: Date of Service: [name] Lloyd 04/28/2023 Medical Record Number: [id number] Patient Account Number: [id number] Date of Birth/Sex: Treating RN: 1946-11-19 (77 y.o. 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 R60.0 Localized edema G30.1 Alzheimer's disease with  late onset F02.818 Dementia in other diseases classified elsewhere, unspecified severity, with other behavioral disturbance Facility Procedures : CPT4 Code: [id number] Description: 99214 - WOUND CARE VISIT-LEV 4 EST PT Modifier: Quantity: 1 Physician Procedures : CPT4 Code Description Modifier [id number] WC PHYS LEVEL 3 NEW PT ICD-10 Diagnosis Description I87.2 Venous insufficiency (chronic) (peripheral) R60.0 Localized edema I83.93 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

## 2023-06-12 ENCOUNTER — Ambulatory Visit: Payer: Medicare Other | Attending: Interventional Cardiology

## 2023-06-12 DIAGNOSIS — D6859 Other primary thrombophilia: Secondary | ICD-10-CM | POA: Insufficient documentation

## 2023-06-12 DIAGNOSIS — Z5181 Encounter for therapeutic drug level monitoring: Secondary | ICD-10-CM | POA: Insufficient documentation

## 2023-06-12 LAB — POCT INR: INR: 2.6 (ref 2.0–3.0)

## 2023-06-12 NOTE — Patient Instructions (Signed)
Continue taking 1/2 tablet daily EXCEPT 1 tablet on Tuesdays and Thursdays.  Stay consistent with greens each week Recheck INR in 4 weeks.  C[facility] [phone] or [phone]

## 2023-06-22 ENCOUNTER — Other Ambulatory Visit: Payer: Self-pay | Admitting: Family Medicine

## 2023-06-22 DIAGNOSIS — M159 Polyosteoarthritis, unspecified: Secondary | ICD-10-CM

## 2023-06-22 NOTE — Telephone Encounter (Signed)
Medication Requested: Tramadol 50 mg tablet Last Refill: 04/28/23 Last Office Visit: 04/03/23 Next Office Visit: 06/24/23 Med Contract: expired - need to renew at July appt.

## 2023-06-23 NOTE — Progress Notes (Signed)
ACUTE VISIT Chief Complaint  Patient presents with   upper quadrant pain    Right side, has had surgeries there in the past. Ongoing about a year, is more constant and painful now. Unable to lay down on the table face down at the chiropractor now.    HPI: [name] is a 77 y.o. male with PMHx significant for Alzheimer's disease,HTN, vit D def,HLD,LE edema with venous ulcers,CAD, and hypercoagulable states on chronic anticoagulation here today with his wife complaining of RUQ/ rib cage pain as described above. Collecting hx is difficult, his wife helps with providing details. Problem has been ongoing for about a year. The pain has become more constant and painful, especially when lying down.  He reports problem as stable but his wife thinks it is becoming more frequent. He thinks it may becoming more frequent.  Abdominal Pain This is a new problem. The current episode started more than 1 year ago. The onset quality is gradual. The problem occurs daily. The problem has been gradually worsening. The pain is located in the RUQ. The pain is moderate. Associated symptoms include arthralgias. Pertinent negatives include no dysuria, fever, hematuria or vomiting. The pain is aggravated by movement. The pain is relieved by Nothing. He has tried nothing for the symptoms. His past medical history is significant for abdominal surgery. There is no history of colon cancer or irritable bowel syndrome.   -Pain is intermittent sometimes sharp, he has described it to his wife like someone is "ripping apart" his skin and bone, with sore and painful ribs - No clear triggers,does not seem to be aggravated food intake or alleviated by defecation. He states that it is mostly annoying but can be severe with sudden movements - No associated nausea,heartburn, changes in bowel habits,urinary symptoms.  He thinks it is related to all his prior surgeries, mentions umbilical and inguinal hernia as well as heart  surgery.  His wife is also concerned about wt loss and no changes in oral intake. He has not been as active. Since April, from 177 to 168 lbs.  His wife is also reporting that he recently started experiencing leg spasms,involuntary episodes of leg movement, and hand tremors. His wife wonders if these symptoms are caused by high B12. Reports that dose of Furosemide has been increased. No cough, difficulty breathing, or wheezing but his wife has noted that he is clearing throat frequently for 2 months. He denies difficulty swallowing.  Lab Results  Component Value Date   VITAMINB12 647 11/21/2020   Lab Results  Component Value Date   NA 141 03/26/2023   CL 108 (H) 03/26/2023   K 3.9 03/26/2023   CO2 25 03/26/2023   BUN 9 03/26/2023   CREATININE 0.83 03/26/2023   EGFR 91 03/26/2023   CALCIUM 10.8 (H) 03/26/2023   ALBUMIN 4.6 03/26/2023   GLUCOSE 105 (H) 03/26/2023   Lab Results  Component Value Date   ALT 25 03/26/2023   AST 28 03/26/2023   ALKPHOS 44 03/26/2023   BILITOT 0.8 03/26/2023   Lab Results  Component Value Date   WBC 3.8 03/26/2023   HGB 13.7 03/26/2023   HCT 40.6 03/26/2023   MCV 93 03/26/2023   PLT 213 03/26/2023   His wife is also concerned about frontal headaches, "bad" earlier today, 5:30 am, when he wpke up with pain. He described pain as "eye ache", it has been going on for a month, reported as new. He has had occipital headaches before. Hx of  chronic neck pain. Not sure about exacerbating or alleviating factors.  Chronic pain on Tramadol to treat neck and joint pain.  Vit D deficiency: He is on Vit D 1000 U daily. On B12 500 mcg daily.  Review of Systems  Constitutional:  Positive for activity change and unexpected weight change. Negative for appetite change, chills and fever.  HENT:  Negative for congestion, mouth sores, nosebleeds, rhinorrhea and sore throat.   Eyes:  Negative for photophobia, redness and visual disturbance.  Cardiovascular:   Positive for leg swelling (Stable.). Negative for chest pain and palpitations.  Gastrointestinal:  Positive for abdominal pain. Negative for blood in stool and vomiting.  Endocrine: Negative for cold intolerance and heat intolerance.  Genitourinary:  Negative for decreased urine volume, dysuria and hematuria.  Musculoskeletal:  Positive for arthralgias, gait problem and neck pain.  Skin:  Negative for rash.  Neurological:  Negative for syncope and facial asymmetry.  See other pertinent positives and negatives in HPI.  Current Outpatient Medications on File Prior to Visit  Medication Sig Dispense Refill   amLODipine (NORVASC) 5 MG tablet TAKE ONE AND ONE-HALF TABLETS DAILY 135 tablet 3   cholecalciferol (VITAMIN D) 1000 units tablet Take 1,000 Units by mouth daily.      Cyanocobalamin (VITAMIN B12) 500 MCG TABS Take 500 mcg by mouth daily.      divalproex (DEPAKOTE) 125 MG DR tablet Take 1 tab at night for 1 week, then increase to 2 tablets at night 180 tablet 3   donepezil (ARICEPT) 10 MG tablet TAKE 1 TABLET AT BEDTIME 90 tablet 3   ezetimibe (ZETIA) 10 MG tablet TAKE 1 TABLET AT BEDTIME 90 tablet 3   fenofibrate (TRICOR) 145 MG tablet TAKE 1 TABLET AT BEDTIME 90 tablet 3   furosemide (LASIX) 20 MG tablet TAKE 1 TABLET DAILY, MAY TAKE AN EXTRA DOSE 20 MG TWICE A WEEK FOR SWELLING 120 tablet 3   losartan (COZAAR) 100 MG tablet Take 1 tablet (100 mg total) by mouth daily. 90 tablet 3   memantine (NAMENDA) 10 MG tablet Take 1 tablet (10 mg total) by mouth 2 (two) times daily. 180 tablet 1   nitroGLYCERIN (NITROSTAT) 0.4 MG SL tablet Place 1 tablet (0.4 mg total) under the tongue every 5 (five) minutes as needed for chest pain. 75 tablet 2   Olopatadine HCl (PATADAY OP) Place 1 drop into both eyes daily as needed (allergies).     rosuvastatin (CRESTOR) 40 MG tablet TAKE 1 TABLET DAILY 90 tablet 3   sodium chloride (OCEAN) 0.65 % SOLN nasal spray Place 1 spray into both nostrils 2 (two) times  daily as needed for congestion.     tamsulosin (FLOMAX) 0.4 MG CAPS capsule Take 0.4 mg by mouth daily after supper.     traMADol (ULTRAM) 50 MG tablet Take 1 tablet (50 mg total) by mouth every 12 (twelve) hours as needed. 60 tablet 2   warfarin (COUMADIN) 5 MG tablet TAKE AS DIRECTED BY COUMADIN CLINIC (Patient taking differently: Take 5-7.5 mg by mouth See admin instructions. Take 5 mg Mon., Wed.,and Fri., all the other day take 7.5 mg  daily TAKE AS DIRECTED BY COUMADIN CLINIC) 80 tablet 3   No current facility-administered medications on file prior to visit.   Past Medical History:  Diagnosis Date   Allergy    Arthritis    CAD (coronary artery disease) 1996   POST CABG   Cataracts, bilateral    Chicken pox    Dyslipidemia  Hyperlipidemia    Hypertension    Sinus bradycardia    Allergies  Allergen Reactions   Other     Environmental allergies   Latex     Itchy hands   Social History   Socioeconomic History   Marital status: Married    Spouse name: [name]   Number of children: 2   Years of education: COLLEGE   Highest education level: Bachelor's degree (e.g., BA, AB, BS)  Occupational History   Occupation: RETIRED  Tobacco Use   Smoking status: Former    Current packs/day: 0.00    Types: Cigarettes    Quit date: 05/29/1982    Years since quitting: 41.0   Smokeless tobacco: Never  Vaping Use   Vaping status: Never Used  Substance and Sexual Activity   Alcohol use: Yes    Comment: 0-2 drinks a night   Drug use: No   Sexual activity: Not on file  Other Topics Concern   Not on file  Social History Narrative   Right handed   Caffeine   One story home lives with wife   retired   Chief Executive Officer Determinants of Corporate investment banker Strain: Low Risk  (12/24/2022)   Overall Financial Resource Strain (CARDIA)    Difficulty of Paying Living Expenses: Not hard at all  Food Insecurity: No Food Insecurity (12/24/2022)   Hunger Vital Sign    Worried About Running  Out of Food in the Last Year: Never true    Ran Out of Food in the Last Year: Never true  Transportation Needs: No Transportation Needs (12/24/2022)   PRAPARE - Administrator, Civil Service (Medical): No    Lack of Transportation (Non-Medical): No  Physical Activity: Inactive (12/24/2022)   Exercise Vital Sign    Days of Exercise per Week: 0 days    Minutes of Exercise per Session: 0 min  Stress: No Stress Concern Present (12/24/2022)   [employer] of Occupational Health - Occupational Stress Questionnaire    Feeling of Stress : Not at all  Recent Concern: Stress - Stress Concern Present (10/23/2022)   [employer] of Occupational Health - Occupational Stress Questionnaire    Feeling of Stress : To some extent  Social Connections: Moderately Isolated (12/24/2022)   Social Connection and Isolation Panel [NHANES]    Frequency of Communication with Friends and Family: More than three times a week    Frequency of Social Gatherings with Friends and Family: More than three times a week    Attends Religious Services: Never    Database administrator or Organizations: No    Attends Banker Meetings: Never    Marital Status: Married   Vitals:   06/24/23 0951  BP: 120/70  Pulse: 60  Resp: 16  Temp: 97.9 F (36.6 C)  SpO2: 95%   Wt Readings from Last 3 Encounters:  06/24/23 168 lb 8 oz (76.4 kg)  04/03/23 177 lb 6 oz (80.5 kg)  03/12/23 175 lb (79.4 kg)   Body mass index is 21.63 kg/m.  Physical Exam Vitals and nursing note reviewed.  Constitutional:      General: He is not in acute distress.    Appearance: He is well-developed.  HENT:     Head: Normocephalic and atraumatic.     Mouth/Throat:     Mouth: Mucous membranes are moist.  Eyes:     Conjunctiva/sclera: Conjunctivae normal.  Cardiovascular:     Rate and Rhythm: Normal rate and regular rhythm.  Heart sounds: No murmur heard. Pulmonary:     Effort: Pulmonary effort is normal. No  respiratory distress.     Breath sounds: Normal breath sounds.  Chest:     Chest wall: No mass, tenderness, crepitus or edema.  Abdominal:     Palpations: Abdomen is soft. There is no hepatomegaly or mass.     Tenderness: There is no abdominal tenderness.    Musculoskeletal:     Cervical back: Decreased range of motion.     Thoracic back: Deformity (kyphosis) present.     Right lower leg: 2+ Pitting Edema present.     Left lower leg: 2+ Pitting Edema present.  Skin:    General: Skin is warm.     Findings: No erythema or rash.  Neurological:     Mental Status: He is alert. Mental status is at baseline.     Comments: Unstable, antalgic gait, assisted with a cane.  Psychiatric:        Mood and Affect: Mood and affect normal.   ASSESSMENT AND PLAN:  Mr. Pinho was seen today for right rib cage pain and other concerns. Lab Results  Component Value Date   NA 140 06/24/2023   CL 103 06/24/2023   K 4.7 06/24/2023   CO2 31 06/24/2023   BUN 20 06/24/2023   CREATININE 1.19 06/24/2023   GFR 59.70 (L) 06/24/2023   CALCIUM 11.1 (H) 06/24/2023   ALBUMIN 4.4 06/24/2023   GLUCOSE 93 06/24/2023   Lab Results  Component Value Date   CRP <1.0 06/24/2023   Lab Results  Component Value Date   ALT 23 06/24/2023   AST 24 06/24/2023   ALKPHOS 32 (L) 06/24/2023   BILITOT 0.7 06/24/2023   Lab Results  Component Value Date   VITAMINB12 526 06/24/2023   Lab Results  Component Value Date   WBC 5.7 06/24/2023   HGB 12.5 (L) 06/24/2023   HCT 38.2 (L) 06/24/2023   MCV 96.8 06/24/2023   PLT 208.0 06/24/2023   Lab Results  Component Value Date   TSH 1.24 06/24/2023   Costal margin pain It does not seem to be GI related but rather musculoskeletal. Not able to reproduce pain today. Rib and CXR ordered today. Monitor for new symptoms.  -     DG Ribs Unilateral W/Chest Right; Future  Other abnormal involuntary movements I did not appreciate this today, reported by his wife as  intermittent for the past few weeks. He has an appt with neurologist next month. Instructed about warning signs.  -     Basic metabolic panel; Future -     C-reactive protein; Future -     Hepatic function panel; Future -     CBC; Future -     Vitamin B12; Future -     TSH; Future  Abnormal weight loss Has lost about 8 Lb sine 03/2023. Possible etiologies discussed, including serious process like malignancies. Last colonoscopy 11/2022. Recommend adding 1-2 protein shakes per day. We will continue monitoring. F/U in 6 weeks.  -     Basic metabolic panel; Future -     C-reactive protein; Future -     Hepatic function panel; Future -     CBC; Future -     TSH; Future  Vitamin D deficiency, unspecified Continue Vit D 1000 U daily. Further recommendations according to 25 OH vit D result.  -     VITAMIN D 25 Hydroxy (Vit-D Deficiency, Fractures); Future  Frontal headache We discussed possible  causes. ? Tension headache. We discussed options, including brain imaging. According to wife, it has been ordered by his neurologist but not able to lie flat on table due to kyphosis and neck problems. Instructed about warning signs. Recommend addressing problem with his neurologist next visit.  I spent a total of 41 minutes in both face to face and non face to face activities for this visit on the date of this encounter. During this time history was obtained and documented, examination was performed, prior labs reviewed, and assessment/plan discussed. Explained that given his various co morbilities, we need to discuss goals of care. We did not address this in detail today.   Return in about 6 weeks (around 08/05/2023) for chronic problems.  Harles Evetts G. [place], MD  [facility]. [place] office.

## 2023-06-24 ENCOUNTER — Encounter: Payer: Self-pay | Admitting: Family Medicine

## 2023-06-24 ENCOUNTER — Ambulatory Visit (INDEPENDENT_AMBULATORY_CARE_PROVIDER_SITE_OTHER): Payer: Medicare Other

## 2023-06-24 ENCOUNTER — Ambulatory Visit: Payer: Medicare Other | Admitting: Family Medicine

## 2023-06-24 VITALS — BP 120/70 | HR 60 | Temp 97.9°F | Resp 16 | Ht 74.0 in | Wt 168.5 lb

## 2023-06-24 DIAGNOSIS — R258 Other abnormal involuntary movements: Secondary | ICD-10-CM

## 2023-06-24 DIAGNOSIS — R0781 Pleurodynia: Secondary | ICD-10-CM

## 2023-06-24 DIAGNOSIS — R634 Abnormal weight loss: Secondary | ICD-10-CM

## 2023-06-24 DIAGNOSIS — R519 Headache, unspecified: Secondary | ICD-10-CM

## 2023-06-24 DIAGNOSIS — G8929 Other chronic pain: Secondary | ICD-10-CM | POA: Diagnosis not present

## 2023-06-24 DIAGNOSIS — E559 Vitamin D deficiency, unspecified: Secondary | ICD-10-CM | POA: Diagnosis not present

## 2023-06-24 LAB — CBC
HCT: 38.2 % — ABNORMAL LOW (ref 39.0–52.0)
Hemoglobin: 12.5 g/dL — ABNORMAL LOW (ref 13.0–17.0)
MCHC: 32.7 g/dL (ref 30.0–36.0)
MCV: 96.8 fl (ref 78.0–100.0)
Platelets: 208 10*3/uL (ref 150.0–400.0)
RBC: 3.95 Mil/uL — ABNORMAL LOW (ref 4.22–5.81)
RDW: 14.4 % (ref 11.5–15.5)
WBC: 5.7 10*3/uL (ref 4.0–10.5)

## 2023-06-24 LAB — BASIC METABOLIC PANEL
BUN: 20 mg/dL (ref 6–23)
CO2: 31 mEq/L (ref 19–32)
Calcium: 11.1 mg/dL — ABNORMAL HIGH (ref 8.4–10.5)
Chloride: 103 mEq/L (ref 96–112)
Creatinine, Ser: 1.19 mg/dL (ref 0.40–1.50)
GFR: 59.7 mL/min — ABNORMAL LOW (ref >60.00)
Glucose, Bld: 93 mg/dL (ref 70–99)
Potassium: 4.7 mEq/L (ref 3.5–5.1)
Sodium: 140 mEq/L (ref 135–145)

## 2023-06-24 LAB — HEPATIC FUNCTION PANEL
ALT: 23 U/L (ref 0–53)
AST: 24 U/L (ref 0–37)
Albumin: 4.4 g/dL (ref 3.5–5.2)
Alkaline Phosphatase: 32 U/L — ABNORMAL LOW (ref 39–117)
Bilirubin, Direct: 0.2 mg/dL (ref 0.0–0.3)
Total Bilirubin: 0.7 mg/dL (ref 0.2–1.2)
Total Protein: 6.4 g/dL (ref 6.0–8.3)

## 2023-06-24 LAB — VITAMIN B12: Vitamin B-12: 526 pg/mL (ref 211–911)

## 2023-06-24 LAB — VITAMIN D 25 HYDROXY (VIT D DEFICIENCY, FRACTURES): VITD: 46.27 ng/mL (ref 30.00–100.00)

## 2023-06-24 LAB — C-REACTIVE PROTEIN: CRP: <1 mg/dL (ref 0.5–20.0)

## 2023-06-24 LAB — TSH: TSH: 1.24 u[IU]/mL (ref 0.35–5.50)

## 2023-06-24 NOTE — Patient Instructions (Addendum)
A few things to remember from today's visit:  Costal margin pain - Plan: DG Ribs Unilateral W/Chest Right  Other abnormal involuntary movements - Plan: Basic metabolic panel, C-reactive protein, Hepatic function panel, CBC, Vitamin B12, TSH  Abnormal weight loss - Plan: Basic metabolic panel, C-reactive protein, Hepatic function panel, CBC, Vitamin B12, TSH  Vitamin D deficiency, unspecified - Plan: VITAMIN D 25 Hydroxy (Vit-D Deficiency, Fractures)  Frontal headache  Pain could be musculoskeletal. Add 1-2 ensures or boosts per day to help with weight. Monitor for new symptoms. Keep appt with neurologist.  If you need refills for medications you take chronically, please call your pharmacy. Do not use My Chart to request refills or for acute issues that need immediate attention. If you send a my chart message, it may take a few days to be addressed, specially if I am not in the office.  Please be sure medication list is accurate. If a new problem present, please set up appointment sooner than planned today.

## 2023-06-29 ENCOUNTER — Encounter: Payer: Self-pay | Admitting: Family Medicine

## 2023-07-06 ENCOUNTER — Ambulatory Visit: Payer: Medicare Other | Attending: Interventional Cardiology

## 2023-07-06 DIAGNOSIS — Z5181 Encounter for therapeutic drug level monitoring: Secondary | ICD-10-CM | POA: Diagnosis not present

## 2023-07-06 DIAGNOSIS — D6859 Other primary thrombophilia: Secondary | ICD-10-CM | POA: Diagnosis not present

## 2023-07-06 LAB — POCT INR: INR: 2.6 (ref 2.0–3.0)

## 2023-07-06 NOTE — Progress Notes (Unsigned)
ACUTE VISIT No chief complaint on file.  HPI: [name] is a 77 y.o. male, who is here today complaining of *** HPI  Review of Systems See other pertinent positives and negatives in HPI.  Current Outpatient Medications on File Prior to Visit  Medication Sig Dispense Refill   amLODipine (NORVASC) 5 MG tablet TAKE ONE AND ONE-HALF TABLETS DAILY 135 tablet 3   cholecalciferol (VITAMIN D) 1000 units tablet Take 1,000 Units by mouth daily.      Cyanocobalamin (VITAMIN B12) 500 MCG TABS Take 500 mcg by mouth daily.      divalproex (DEPAKOTE) 125 MG DR tablet Take 1 tab at night for 1 week, then increase to 2 tablets at night 180 tablet 3   donepezil (ARICEPT) 10 MG tablet TAKE 1 TABLET AT BEDTIME 90 tablet 3   ezetimibe (ZETIA) 10 MG tablet TAKE 1 TABLET AT BEDTIME 90 tablet 3   fenofibrate (TRICOR) 145 MG tablet TAKE 1 TABLET AT BEDTIME 90 tablet 3   furosemide (LASIX) 20 MG tablet TAKE 1 TABLET DAILY, MAY TAKE AN EXTRA DOSE 20 MG TWICE A WEEK FOR SWELLING 120 tablet 3   losartan (COZAAR) 100 MG tablet Take 1 tablet (100 mg total) by mouth daily. 90 tablet 3   memantine (NAMENDA) 10 MG tablet Take 1 tablet (10 mg total) by mouth 2 (two) times daily. 180 tablet 1   nitroGLYCERIN (NITROSTAT) 0.4 MG SL tablet Place 1 tablet (0.4 mg total) under the tongue every 5 (five) minutes as needed for chest pain. 75 tablet 2   Olopatadine HCl (PATADAY OP) Place 1 drop into both eyes daily as needed (allergies).     rosuvastatin (CRESTOR) 40 MG tablet TAKE 1 TABLET DAILY 90 tablet 3   sodium chloride (OCEAN) 0.65 % SOLN nasal spray Place 1 spray into both nostrils 2 (two) times daily as needed for congestion.     tamsulosin (FLOMAX) 0.4 MG CAPS capsule Take 0.4 mg by mouth daily after supper.     traMADol (ULTRAM) 50 MG tablet Take 1 tablet (50 mg total) by mouth every 12 (twelve) hours as needed. 60 tablet 2   warfarin (COUMADIN) 5 MG tablet TAKE AS DIRECTED BY COUMADIN CLINIC (Patient  taking differently: Take 5-7.5 mg by mouth See admin instructions. Take 5 mg Mon., Wed.,and Fri., all the other day take 7.5 mg  daily TAKE AS DIRECTED BY COUMADIN CLINIC) 80 tablet 3   No current facility-administered medications on file prior to visit.    Past Medical History:  Diagnosis Date   Allergy    Arthritis    CAD (coronary artery disease) 1996   POST CABG   Cataracts, bilateral    Chicken pox    Dyslipidemia    Hyperlipidemia    Hypertension    Sinus bradycardia    Allergies  Allergen Reactions   Other     Environmental allergies   Latex     Itchy hands    Social History   Socioeconomic History   Marital status: Married    Spouse [name]   Number of children: 2   Years of education: COLLEGE   Highest education level: Bachelor's degree (e.g., BA, AB, BS)  Occupational History   Occupation: RETIRED  Tobacco Use   Smoking status: Former    Current packs/day: 0.00    Types: Cigarettes    Quit date: 05/29/1982    Years since quitting: 41.1   Smokeless tobacco: Never  Vaping Use  Vaping status: Never Used  Substance and Sexual Activity   Alcohol use: Yes    Comment: 0-2 drinks a night   Drug use: No   Sexual activity: Not on file  Other Topics Concern   Not on file  Social History Narrative   Right handed   Caffeine   One story home lives with wife   retired   Chief Executive Officer Determinants of Corporate investment banker Strain: Low Risk  (12/24/2022)   Overall Financial Resource Strain (CARDIA)    Difficulty of Paying Living Expenses: Not hard at all  Food Insecurity: No Food Insecurity (12/24/2022)   Hunger Vital Sign    Worried About Running Out of Food in the Last Year: Never true    Ran Out of Food in the Last Year: Never true  Transportation Needs: No Transportation Needs (12/24/2022)   PRAPARE - Administrator, Civil Service (Medical): No    Lack of Transportation (Non-Medical): No  Physical Activity: Inactive (12/24/2022)    Exercise Vital Sign    Days of Exercise per Week: 0 days    Minutes of Exercise per Session: 0 min  Stress: No Stress Concern Present (12/24/2022)   [employer] of Occupational Health - Occupational Stress Questionnaire    Feeling of Stress : Not at all  Recent Concern: Stress - Stress Concern Present (10/23/2022)   [employer] of Occupational Health - Occupational Stress Questionnaire    Feeling of Stress : To some extent  Social Connections: Moderately Isolated (12/24/2022)   Social Connection and Isolation Panel [NHANES]    Frequency of Communication with Friends and Family: More than three times a week    Frequency of Social Gatherings with Friends and Family: More than three times a week    Attends Religious Services: Never    Database administrator or Organizations: No    Attends Banker Meetings: Never    Marital Status: Married    There were no vitals filed for this visit. There is no height or weight on file to calculate BMI.  Physical Exam  ASSESSMENT AND PLAN: There are no diagnoses linked to this encounter.  No follow-ups on file.  Timothy Treloar G. [place], MD  [facility] [place] office.  Discharge Instructions   None

## 2023-07-06 NOTE — Patient Instructions (Signed)
Continue taking 1/2 tablet daily EXCEPT 1 tablet on Tuesdays and Thursdays.  Stay consistent with greens each week Recheck INR in 4 weeks.  C[facility] [phone] or [phone]

## 2023-07-07 ENCOUNTER — Ambulatory Visit (INDEPENDENT_AMBULATORY_CARE_PROVIDER_SITE_OTHER): Payer: Medicare Other | Admitting: Family Medicine

## 2023-07-07 ENCOUNTER — Encounter: Payer: Self-pay | Admitting: Family Medicine

## 2023-07-07 VITALS — BP 110/70 | HR 60 | Resp 16 | Ht 74.0 in | Wt 169.0 lb

## 2023-07-07 DIAGNOSIS — I83029 Varicose veins of left lower extremity with ulcer of unspecified site: Secondary | ICD-10-CM | POA: Diagnosis not present

## 2023-07-07 DIAGNOSIS — L97929 Non-pressure chronic ulcer of unspecified part of left lower leg with unspecified severity: Secondary | ICD-10-CM | POA: Diagnosis not present

## 2023-07-07 DIAGNOSIS — R310 Gross hematuria: Secondary | ICD-10-CM | POA: Diagnosis not present

## 2023-07-07 NOTE — Assessment & Plan Note (Signed)
Per wife report, it is 95% healed. Continue LE elevation and Furosemide same dose for edema controlled.

## 2023-07-07 NOTE — Patient Instructions (Signed)
A few things to remember from today's visit:  Gross hematuria Collect urine at home and bring it to the lab. Please call Dr Shannan Harper office, urologist, and arrange an appt for blood in urine.  If you need refills for medications you take chronically, please call your pharmacy. Do not use My Chart to request refills or for acute issues that need immediate attention. If you send a my chart message, it may take a few days to be addressed, specially if I am not in the office.  Please be sure medication list is accurate. If a new problem present, please set up appointment sooner than planned today.

## 2023-07-08 ENCOUNTER — Encounter (INDEPENDENT_AMBULATORY_CARE_PROVIDER_SITE_OTHER): Payer: Self-pay

## 2023-07-14 ENCOUNTER — Ambulatory Visit (INDEPENDENT_AMBULATORY_CARE_PROVIDER_SITE_OTHER): Payer: Medicare Other | Admitting: Physician Assistant

## 2023-07-14 ENCOUNTER — Encounter: Payer: Self-pay | Admitting: Physician Assistant

## 2023-07-14 ENCOUNTER — Emergency Department (HOSPITAL_COMMUNITY)
Admission: EM | Admit: 2023-07-14 | Discharge: 2023-07-14 | Disposition: A | Discharge status: Home / Self Care | Payer: Medicare Other | Attending: Emergency Medicine | Admitting: Emergency Medicine

## 2023-07-14 ENCOUNTER — Emergency Department (HOSPITAL_COMMUNITY): Payer: Medicare Other

## 2023-07-14 VITALS — BP 110/50 | HR 59 | Resp 20 | Ht 74.0 in

## 2023-07-14 DIAGNOSIS — D72819 Decreased white blood cell count, unspecified: Secondary | ICD-10-CM | POA: Diagnosis not present

## 2023-07-14 DIAGNOSIS — Z91040 Latex allergy status: Secondary | ICD-10-CM | POA: Insufficient documentation

## 2023-07-14 DIAGNOSIS — R0789 Other chest pain: Secondary | ICD-10-CM | POA: Insufficient documentation

## 2023-07-14 DIAGNOSIS — I2510 Atherosclerotic heart disease of native coronary artery without angina pectoris: Secondary | ICD-10-CM | POA: Diagnosis not present

## 2023-07-14 DIAGNOSIS — I959 Hypotension, unspecified: Secondary | ICD-10-CM | POA: Diagnosis not present

## 2023-07-14 DIAGNOSIS — Z79899 Other long term (current) drug therapy: Secondary | ICD-10-CM | POA: Diagnosis not present

## 2023-07-14 DIAGNOSIS — Z7901 Long term (current) use of anticoagulants: Secondary | ICD-10-CM | POA: Diagnosis not present

## 2023-07-14 DIAGNOSIS — F0390 Unspecified dementia without behavioral disturbance: Secondary | ICD-10-CM | POA: Diagnosis not present

## 2023-07-14 DIAGNOSIS — Z951 Presence of aortocoronary bypass graft: Secondary | ICD-10-CM | POA: Diagnosis not present

## 2023-07-14 DIAGNOSIS — R079 Chest pain, unspecified: Secondary | ICD-10-CM | POA: Diagnosis not present

## 2023-07-14 DIAGNOSIS — I10 Essential (primary) hypertension: Secondary | ICD-10-CM | POA: Diagnosis not present

## 2023-07-14 DIAGNOSIS — R0781 Pleurodynia: Secondary | ICD-10-CM | POA: Diagnosis not present

## 2023-07-14 LAB — COMPREHENSIVE METABOLIC PANEL
ALT: 28 U/L (ref 0–44)
AST: 29 U/L (ref 15–41)
Albumin: 4 g/dL (ref 3.5–5.0)
Alkaline Phosphatase: 30 U/L — ABNORMAL LOW (ref 38–126)
Anion gap: 13 (ref 5–15)
BUN: 18 mg/dL (ref 8–23)
CO2: 25 mmol/L (ref 22–32)
Calcium: 10.9 mg/dL — ABNORMAL HIGH (ref 8.9–10.3)
Chloride: 102 mmol/L (ref 98–111)
Creatinine, Ser: 1 mg/dL (ref 0.61–1.24)
GFR, Estimated: >60 mL/min (ref >60)
Glucose, Bld: 110 mg/dL — ABNORMAL HIGH (ref 70–99)
Potassium: 4.3 mmol/L (ref 3.5–5.1)
Sodium: 140 mmol/L (ref 135–145)
Total Bilirubin: 0.7 mg/dL (ref 0.3–1.2)
Total Protein: 5.9 g/dL — ABNORMAL LOW (ref 6.5–8.1)

## 2023-07-14 LAB — CBC WITH DIFFERENTIAL/PLATELET
Abs Immature Granulocytes: 0.01 10*3/uL (ref 0.00–0.07)
Basophils Absolute: 0 10*3/uL (ref 0.0–0.1)
Basophils Relative: 1 %
Eosinophils Absolute: 0.1 10*3/uL (ref 0.0–0.5)
Eosinophils Relative: 3 %
HCT: 36.2 % — ABNORMAL LOW (ref 39.0–52.0)
Hemoglobin: 11.8 g/dL — ABNORMAL LOW (ref 13.0–17.0)
Immature Granulocytes: 0 %
Lymphocytes Relative: 18 %
Lymphs Abs: 0.6 10*3/uL — ABNORMAL LOW (ref 0.7–4.0)
MCH: 31.1 pg (ref 26.0–34.0)
MCHC: 32.6 g/dL (ref 30.0–36.0)
MCV: 95.5 fL (ref 80.0–100.0)
Monocytes Absolute: 0.4 10*3/uL (ref 0.1–1.0)
Monocytes Relative: 12 %
Neutro Abs: 2.3 10*3/uL (ref 1.7–7.7)
Neutrophils Relative %: 66 %
Platelets: 222 10*3/uL (ref 150–400)
RBC: 3.79 MIL/uL — ABNORMAL LOW (ref 4.22–5.81)
RDW: 13.5 % (ref 11.5–15.5)
WBC: 3.5 10*3/uL — ABNORMAL LOW (ref 4.0–10.5)
nRBC: 0 % (ref 0.0–0.2)

## 2023-07-14 LAB — PROTIME-INR
INR: 3.5 — ABNORMAL HIGH (ref 0.8–1.2)
Prothrombin Time: 35 seconds — ABNORMAL HIGH (ref 11.4–15.2)

## 2023-07-14 LAB — TROPONIN I (HIGH SENSITIVITY)
Troponin I (High Sensitivity): 8 ng/L (ref <18)
Troponin I (High Sensitivity): 9 ng/L (ref <18)

## 2023-07-14 NOTE — ED Provider Notes (Signed)
[place] EMERGENCY DEPARTMENT AT [facility] Provider Note   CSN: [id number] Arrival date & time: 07/14/23  1139     History  Chief Complaint  Patient presents with   Right Sided Pain    [name] is a 77 y.o. male.  With past medical history of CAD, hypertension, hyperlipidemia, hypercoagulable state on Coumadin, dementia who presents to the emergency department with side pain.  The history is provided primarily by the wife at bedside.  She states that they were in the car on the way to a neurology appointment around 1030 to 11 AM when the patient began having severe pain in both of his sides.  He describes the pain as a "zap" or shock that came from both sides around the rib cage towards the center of his chest.  The wife states that it was severe enough for him to jump in the car.  Apparently this happened a few times.  He was able to get out and walk into the neurology office but she states when they got there and sat down he was complaining of the room feeling hot.  She denies him actually having diaphoresis.  The pain began again at the neurology office and he took a nitroglycerin which helped and they now present here.  He is currently asymptomatic.  He denies having any trauma to the chest or sides.  He denies any recent cough or fevers.  He denies palpitations or shortness of breath.  Denies any recent long drives, flights.   HPI     Home Medications Prior to Admission medications   Medication Sig Start Date End Date Taking? Authorizing Provider  amLODipine (NORVASC) 5 MG tablet TAKE ONE AND ONE-HALF TABLETS DAILY 01/21/23   Corky Crafts, MD  cholecalciferol (VITAMIN D) 1000 units tablet Take 1,000 Units by mouth daily.     [provider]  Cyanocobalamin (VITAMIN B12) 500 MCG TABS Take 500 mcg by mouth daily.     [provider]  divalproex (DEPAKOTE) 125 MG DR tablet Take 1 tab at night for 1 week, then increase to 2 tablets at  night 10/11/21   Gwynneth Munson, Sung Amabile, PA-C  donepezil (ARICEPT) 10 MG tablet TAKE 1 TABLET AT BEDTIME 09/01/22   Marcos Eke, PA-C  ezetimibe (ZETIA) 10 MG tablet TAKE 1 TABLET AT BEDTIME 05/18/23   Corky Crafts, MD  fenofibrate (TRICOR) 145 MG tablet TAKE 1 TABLET AT BEDTIME 03/31/23   Corky Crafts, MD  finasteride (PROSCAR) 5 MG tablet Take 5 mg by mouth daily. 06/12/23   [provider]  furosemide (LASIX) 20 MG tablet TAKE 1 TABLET DAILY, MAY TAKE AN EXTRA DOSE 20 MG TWICE A WEEK FOR SWELLING 05/01/23   Corky Crafts, MD  losartan (COZAAR) 100 MG tablet Take 1 tablet (100 mg total) by mouth daily. 03/18/23   Corky Crafts, MD  memantine (NAMENDA) 10 MG tablet Take 1 tablet (10 mg total) by mouth 2 (two) times daily. 02/18/23   Marcos Eke, PA-C  nitroGLYCERIN (NITROSTAT) 0.4 MG SL tablet Place 1 tablet (0.4 mg total) under the tongue every 5 (five) minutes as needed for chest pain. 01/24/22   Corky Crafts, MD  Olopatadine HCl (PATADAY OP) Place 1 drop into both eyes daily as needed (allergies).    [provider]  rosuvastatin (CRESTOR) 40 MG tablet TAKE 1 TABLET DAILY 05/18/23   Corky Crafts, MD  sodium chloride (OCEAN) 0.65 % SOLN  nasal spray Place 1 spray into both nostrils 2 (two) times daily as needed for congestion.    [provider]  tamsulosin (FLOMAX) 0.4 MG CAPS capsule Take 0.4 mg by mouth daily after supper.    [provider]  traMADol (ULTRAM) 50 MG tablet Take 1 tablet (50 mg total) by mouth every 12 (twelve) hours as needed. 06/23/23   [place], Betty G, MD  warfarin (COUMADIN) 5 MG tablet TAKE AS DIRECTED BY COUMADIN CLINIC Patient taking differently: Take 5-7.5 mg by mouth See admin instructions. Take 5 mg Mon., Wed.,and Fri., all the other day take 7.5 mg  daily TAKE AS DIRECTED BY COUMADIN CLINIC 08/08/22   Corky Crafts, MD      Allergies    Other and Latex    Review of Systems   Review of  Systems  Cardiovascular:  Positive for chest pain.  All other systems reviewed and are negative.   Physical Exam Updated Vital Signs BP 124/74   Pulse (!) 51   Temp 98.5 F (36.9 C) (Oral)   Resp 19   SpO2 100%  Physical Exam Vitals and nursing note reviewed.  Constitutional:      General: He is not in acute distress.    Appearance: Normal appearance. He is not diaphoretic.     Comments: Elderly, more frail appearing  HENT:     Head: Normocephalic.     Mouth/Throat:     Mouth: Mucous membranes are dry.     Pharynx: Oropharynx is clear.  Eyes:     General: No scleral icterus.    Extraocular Movements: Extraocular movements intact.  Cardiovascular:     Rate and Rhythm: Regular rhythm. Bradycardia present.     Pulses: Normal pulses.     Heart sounds: No murmur heard. Pulmonary:     Effort: Pulmonary effort is normal. No respiratory distress.     Breath sounds: Normal breath sounds. No rhonchi or rales.  Chest:     Chest wall: No tenderness.  Abdominal:     General: Bowel sounds are normal.     Palpations: Abdomen is soft.  Musculoskeletal:     Right lower leg: Edema present.     Left lower leg: Edema present.  Skin:    General: Skin is warm and dry.     Capillary Refill: Capillary refill takes less than 2 seconds.  Neurological:     General: No focal deficit present.     Mental Status: He is alert. Mental status is at baseline.  Psychiatric:        Mood and Affect: Mood normal.        Behavior: Behavior normal.     ED Results / Procedures / Treatments   Labs (all labs ordered are listed, but only abnormal results are displayed) Labs Reviewed  COMPREHENSIVE METABOLIC PANEL - Abnormal; Notable for the following components:      Result Value   Glucose, Bld 110 (*)    Calcium 10.9 (*)    Total Protein 5.9 (*)    Alkaline Phosphatase 30 (*)    All other components within normal limits  CBC WITH DIFFERENTIAL/PLATELET - Abnormal; Notable for the following  components:   WBC 3.5 (*)    RBC 3.79 (*)    Hemoglobin 11.8 (*)    HCT 36.2 (*)    Lymphs Abs 0.6 (*)    All other components within normal limits  PROTIME-INR - Abnormal; Notable for the following components:   Prothrombin Time 35.0 (*)  INR 3.5 (*)    All other components within normal limits  TROPONIN I (HIGH SENSITIVITY)    EKG None  Radiology No results found.  Procedures Procedures  {Document cardiac monitor, telemetry assessment procedure when appropriate:1}  Medications Ordered in ED Medications - No data to display  ED Course/ Medical Decision Making/ A&P   {   Click here for ABCD2, HEART and other calculatorsREFRESH Note before signing :1}                              Medical Decision Making Amount and/or Complexity of Data Reviewed Labs: ordered. Radiology: ordered.  Initial Impression and Ddx 77 year old male who presents to the emergency department with side pain Patient PMH that increases complexity of ED encounter: Coronary artery disease, dementia, hypertension, hyperlipidemia, hypercoagulable state  Interpretation of Diagnostics I independent reviewed and interpreted the labs as followed: CBC with leukopenia to 3.5, anemia to 11.8, stable.  CMP with no significant electrolyte derangement, AKI or transaminitis.  Initial troponin is 8, delta***, INR 3.5  - I independently visualized the following imaging with scope of interpretation limited to determining acute life threatening conditions related to emergency care: Chest x-ray, which revealed ***  Patient Reassessment and Ultimate Disposition/Management 77 year old male who presents to the emergency department with side pain.  He is currently asymptomatic.  On exam he is mildly bradycardic.  No murmur.  He does have bilateral 2+ pitting edema which the wife states is baseline for him.  He does take Lasix daily.  This has been worsening over the past year, she states that cardiology is aware of  this. Will obtain an ACS workup as well as an INR to ensure that he is therapeutic on his Coumadin.  Patient management required discussion with the following services or consulting groups:  {BEROCONSULT:26841}  Complexity of Problems Addressed {BEROCOPA:26833}  Additional Data Reviewed and Analyzed Further history obtained from: {BERODATA:26834}  Patient Encounter Risk Assessment {BERORISK:26838}  Final Clinical Impression(s) / ED Diagnoses Final diagnoses:  None    Rx / DC Orders ED Discharge Orders     None

## 2023-07-14 NOTE — ED Triage Notes (Signed)
Pt to ED via EMS from PCP. Pt lives at home with wife. Pt has hx of dementia. Pt c/o sharp right sided rib pain. Pt took nitroglycerin in WR at PCP. Pt denies N/V, SOB, dizziness. AAOx3.   EMS Vitals: 108/54 58 HR 112 CBG 94% RA

## 2023-07-14 NOTE — ED Notes (Signed)
Patient transported to X-ray 

## 2023-07-14 NOTE — ED Provider Notes (Signed)
Accepted handoff at shift change from Mertha Baars PA-C. Please see prior provider note for more detail.   Briefly: Patient is 77 y.o.   DDX: concern for Hx of dementia, hypertension, previous stenting in 90s. Was in car when started having zapping, sharp pain on both ribcaged going towards center of chest. Painful enough that he was jumping -- complained of feeling hot while in lobby of neuro office. Improved with nitroglycerin, called EMS. No ongoing symptoms. No rash. No abdominal pain. Getting ACS workup. Delta troponin negative and plan to send home. Has been wanting to go home since arrival. On coumadin 2/2 "hypercoagulable state". Supratherapeutic INR, no suspicion PE.   Plan: Pending delta troponin.  I adamantly interpreted repeat troponin which is negative this time.  As patient is asymptomatic patient and wife are comfortable with discharge.  Encouraged PCP follow-up.      RISR  EDTHIS     Olene Floss, PA-C 07/14/23 1613    Charlynne Pander, MD 07/14/23 2251

## 2023-07-14 NOTE — Progress Notes (Signed)
Patient came in to be seen front desk called patient was having chest pains. Wife had given me a nitroglycerin before I was able to access him with nursing vitals at 11:04am bp 140/80  pulse rate 59 os at 93%. Marlowe Kays, PA-C, did her assessment and patient was placed in a room to access further. Bp recheck at 110/50 pluse 59 o2 stat at 93%. Patient was feeing tried, chest pains continued bilterally. O2 dropped down at 91% administer 2L o2 via nasal cannula. EMS was on site with in a few minutes. Report was given to EMS and Jerelyn Charles came in and patient was transported to hospital via stretcher.

## 2023-07-15 ENCOUNTER — Encounter (HOSPITAL_COMMUNITY): Payer: Self-pay

## 2023-07-15 ENCOUNTER — Emergency Department (HOSPITAL_COMMUNITY)
Admission: EM | Admit: 2023-07-15 | Discharge: 2023-07-15 | Disposition: A | Discharge status: Home / Self Care | Payer: Medicare Other | Attending: Emergency Medicine | Admitting: Emergency Medicine

## 2023-07-15 ENCOUNTER — Ambulatory Visit (INDEPENDENT_AMBULATORY_CARE_PROVIDER_SITE_OTHER): Payer: Medicare Other

## 2023-07-15 DIAGNOSIS — Z5181 Encounter for therapeutic drug level monitoring: Secondary | ICD-10-CM

## 2023-07-15 DIAGNOSIS — Z7901 Long term (current) use of anticoagulants: Secondary | ICD-10-CM | POA: Insufficient documentation

## 2023-07-15 DIAGNOSIS — R072 Precordial pain: Secondary | ICD-10-CM | POA: Diagnosis not present

## 2023-07-15 DIAGNOSIS — Z91040 Latex allergy status: Secondary | ICD-10-CM | POA: Insufficient documentation

## 2023-07-15 DIAGNOSIS — I2510 Atherosclerotic heart disease of native coronary artery without angina pectoris: Secondary | ICD-10-CM | POA: Diagnosis not present

## 2023-07-15 DIAGNOSIS — Z8679 Personal history of other diseases of the circulatory system: Secondary | ICD-10-CM

## 2023-07-15 DIAGNOSIS — E876 Hypokalemia: Secondary | ICD-10-CM | POA: Insufficient documentation

## 2023-07-15 DIAGNOSIS — R410 Disorientation, unspecified: Secondary | ICD-10-CM | POA: Diagnosis not present

## 2023-07-15 DIAGNOSIS — R079 Chest pain, unspecified: Secondary | ICD-10-CM | POA: Diagnosis not present

## 2023-07-15 DIAGNOSIS — R0789 Other chest pain: Secondary | ICD-10-CM | POA: Diagnosis not present

## 2023-07-15 DIAGNOSIS — Z8739 Personal history of other diseases of the musculoskeletal system and connective tissue: Secondary | ICD-10-CM | POA: Diagnosis not present

## 2023-07-15 LAB — CBC
HCT: 37.9 % — ABNORMAL LOW (ref 39.0–52.0)
Hemoglobin: 12.6 g/dL — ABNORMAL LOW (ref 13.0–17.0)
MCH: 32.6 pg (ref 26.0–34.0)
MCHC: 33.2 g/dL (ref 30.0–36.0)
MCV: 97.9 fL (ref 80.0–100.0)
Platelets: 200 10*3/uL (ref 150–400)
RBC: 3.87 MIL/uL — ABNORMAL LOW (ref 4.22–5.81)
RDW: 13.4 % (ref 11.5–15.5)
WBC: 4.1 10*3/uL (ref 4.0–10.5)
nRBC: 0 % (ref 0.0–0.2)

## 2023-07-15 LAB — BASIC METABOLIC PANEL
Anion gap: 15 (ref 5–15)
BUN: 15 mg/dL (ref 8–23)
CO2: 22 mmol/L (ref 22–32)
Calcium: 10.4 mg/dL — ABNORMAL HIGH (ref 8.9–10.3)
Chloride: 103 mmol/L (ref 98–111)
Creatinine, Ser: 0.93 mg/dL (ref 0.61–1.24)
GFR, Estimated: >60 mL/min (ref >60)
Glucose, Bld: 94 mg/dL (ref 70–99)
Potassium: 3.4 mmol/L — ABNORMAL LOW (ref 3.5–5.1)
Sodium: 140 mmol/L (ref 135–145)

## 2023-07-15 LAB — TROPONIN I (HIGH SENSITIVITY): Troponin I (High Sensitivity): 10 ng/L (ref <18)

## 2023-07-15 MED ORDER — ACETAMINOPHEN 500 MG PO TABS: 1000.0000 mg | ORAL_TABLET | Freq: Once | ORAL | Status: DC

## 2023-07-15 MED ORDER — POTASSIUM CHLORIDE CRYS ER 20 MEQ PO TBCR
40.0000 meq | EXTENDED_RELEASE_TABLET | Freq: Once | ORAL | Status: AC
  Administered 2023-07-15: 40 meq via ORAL
  Filled 2023-07-15: qty 2

## 2023-07-15 MED ORDER — TRAMADOL HCL 50 MG PO TABS
50.0000 mg | ORAL_TABLET | Freq: Once | ORAL | Status: AC
  Administered 2023-07-15: 50 mg via ORAL
  Filled 2023-07-15: qty 1

## 2023-07-15 NOTE — Discharge Instructions (Addendum)
It was our pleasure to provide your ER care today - we hope that you feel better.  Take acetaminophen as need for pain. You may also take your ultram as need for pain.   Follow up closely with your doctor and cardiologist in the next 1-2 weeks - see made referral to cardiology for follow up, and office should contact you with an appointment in the next couple days.   Your potassium level is mildly low - eat plenty of fruits and vegetables, and follow up with your doctor.   Return to ER if worse, new symptoms, fevers, recurrent/persistent chest pain, increased trouble breathing, or other concern.

## 2023-07-15 NOTE — ED Provider Notes (Signed)
[place] EMERGENCY DEPARTMENT AT [facility] Provider Note   CSN: [id number] Arrival date & time: 07/15/23  4098     History  Chief Complaint  Patient presents with   Chest Pain    10/10 chest pain "feels like burning" that woke him up from his sleep. Wife gave x2 SL Nitroglycerin at home before EMS arrival, denies n/v/d. Patient has 1/10 much improved now.     [name] is a 77 y.o. male.  Pt with c/o sharp pain to left lower chest this AM when was trying to get/roll out of bed. Indicates pain did not awaken from sleep but rather started as he was moving about to try to get out of bed. Notes hx similar pain yesterday, and on recurrent basis for past year or so. No exertional chest pain. No pleuritic chest pain. No associated sob, nv or diaphoresis. Denies new leg pain or swelling. No hx PE. Is on coumadin for history hypercoagulable state - INR yesterday 3.5, no abn bleeding. Denies cough or fever. No trauma or fall. No radicular pain.   The history is provided by the patient, medical records and the spouse.  Chest Pain Associated symptoms: no abdominal pain, no cough, no fever, no headache, no nausea, no palpitations, no shortness of breath and no vomiting        Home Medications Prior to Admission medications   Medication Sig Start Date End Date Taking? Authorizing Provider  amLODipine (NORVASC) 5 MG tablet TAKE ONE AND ONE-HALF TABLETS DAILY Patient taking differently: Take 5 mg by mouth daily. 01/21/23  Yes Corky Crafts, MD  cholecalciferol (VITAMIN D) 1000 units tablet Take 1,000 Units by mouth daily.    Yes [provider]  Cyanocobalamin (VITAMIN B12) 500 MCG TABS Take 500 mcg by mouth daily.    Yes [provider]  donepezil (ARICEPT) 10 MG tablet TAKE 1 TABLET AT BEDTIME 09/01/22  Yes Wertman, Sung Amabile, PA-C  ezetimibe (ZETIA) 10 MG tablet TAKE 1 TABLET AT BEDTIME 05/18/23  Yes Corky Crafts, MD  fenofibrate (TRICOR) 145 MG  tablet TAKE 1 TABLET AT BEDTIME 03/31/23  Yes Corky Crafts, MD  finasteride (PROSCAR) 5 MG tablet Take 5 mg by mouth daily. 06/12/23  Yes [provider]  furosemide (LASIX) 20 MG tablet TAKE 1 TABLET DAILY, MAY TAKE AN EXTRA DOSE 20 MG TWICE A WEEK FOR SWELLING 05/01/23  Yes Corky Crafts, MD  losartan (COZAAR) 100 MG tablet Take 1 tablet (100 mg total) by mouth daily. 03/18/23  Yes Corky Crafts, MD  memantine (NAMENDA) 10 MG tablet Take 1 tablet (10 mg total) by mouth 2 (two) times daily. 02/18/23  Yes Marcos Eke, PA-C  nitroGLYCERIN (NITROSTAT) 0.4 MG SL tablet Place 1 tablet (0.4 mg total) under the tongue every 5 (five) minutes as needed for chest pain. 01/24/22  Yes Corky Crafts, MD  rosuvastatin (CRESTOR) 40 MG tablet TAKE 1 TABLET DAILY 05/18/23  Yes Corky Crafts, MD  tamsulosin (FLOMAX) 0.4 MG CAPS capsule Take 0.4 mg by mouth daily after supper.   Yes [provider]  traMADol (ULTRAM) 50 MG tablet Take 1 tablet (50 mg total) by mouth every 12 (twelve) hours as needed. Patient taking differently: Take 50 mg by mouth See admin instructions. Take 50mg  by mouth 1-2 times a day. 06/23/23  Yes [place], Betty G, MD  warfarin (COUMADIN) 5 MG tablet TAKE AS DIRECTED BY COUMADIN CLINIC Patient taking differently: Take 5  mg by mouth See admin instructions. Take 2.5 mg on Mon,Wed,Fri,Sat,and Sun. 5 mg on Tue and Thu. 08/08/22  Yes Corky Crafts, MD      Allergies    Other and Latex    Review of Systems   Review of Systems  Constitutional:  Negative for fever.  HENT:  Negative for sore throat.   Eyes:  Negative for redness.  Respiratory:  Negative for cough and shortness of breath.   Cardiovascular:  Positive for chest pain. Negative for palpitations.  Gastrointestinal:  Negative for abdominal pain, nausea and vomiting.  Genitourinary:  Negative for flank pain.  Musculoskeletal:        Hx ddd, neck and back pain - no acute change.    Skin:  Negative for rash.  Neurological:  Negative for headaches.  Psychiatric/Behavioral:  Negative for confusion.     Physical Exam Updated Vital Signs BP 128/66   Pulse (!) 51   Temp 98 F (36.7 C) (Oral)   Resp 13   SpO2 98%  Physical Exam Vitals and nursing note reviewed.  Constitutional:      Appearance: Normal appearance. He is well-developed.  HENT:     Head: Atraumatic.     Nose: Nose normal.     Mouth/Throat:     Mouth: Mucous membranes are moist.     Pharynx: Oropharynx is clear.  Eyes:     General: No scleral icterus.    Conjunctiva/sclera: Conjunctivae normal.     Pupils: Pupils are equal, round, and reactive to light.  Neck:     Trachea: No tracheal deviation.  Cardiovascular:     Rate and Rhythm: Normal rate and regular rhythm.     Pulses: Normal pulses.     Heart sounds: Normal heart sounds. No murmur heard.    No friction rub. No gallop.  Pulmonary:     Effort: Pulmonary effort is normal. No accessory muscle usage or respiratory distress.     Breath sounds: Normal breath sounds.  Abdominal:     General: Bowel sounds are normal. There is no distension.     Palpations: Abdomen is soft.     Tenderness: There is no abdominal tenderness. There is no guarding.  Genitourinary:    Comments: No cva tenderness. Musculoskeletal:        General: No tenderness.     Cervical back: Normal range of motion and neck supple. No rigidity.     Comments: Mild symmetric ankle/lower leg edema.   Skin:    General: Skin is warm and dry.     Findings: No rash.  Neurological:     Mental Status: He is alert.     Comments: Alert, speech clear. Motor/sens grossly intact bil.   Psychiatric:        Mood and Affect: Mood normal.     ED Results / Procedures / Treatments   Labs (all labs ordered are listed, but only abnormal results are displayed) Results for orders placed or performed during the hospital encounter of 07/15/23  Basic metabolic panel  Result Value Ref  Range   Sodium 140 135 - 145 mmol/L   Potassium 3.4 (L) 3.5 - 5.1 mmol/L   Chloride 103 98 - 111 mmol/L   CO2 22 22 - 32 mmol/L   Glucose, Bld 94 70 - 99 mg/dL   BUN 15 8 - 23 mg/dL   Creatinine, Ser 1.61 0.61 - 1.24 mg/dL   Calcium 09.6 (H) 8.9 - 10.3 mg/dL   GFR, Estimated >04 >  60 mL/min   Anion gap 15 5 - 15  CBC  Result Value Ref Range   WBC 4.1 4.0 - 10.5 K/uL   RBC 3.87 (L) 4.22 - 5.81 MIL/uL   Hemoglobin 12.6 (L) 13.0 - 17.0 g/dL   HCT 86.5 (L) 78.4 - 69.6 %   MCV 97.9 80.0 - 100.0 fL   MCH 32.6 26.0 - 34.0 pg   MCHC 33.2 30.0 - 36.0 g/dL   RDW 29.5 28.4 - 13.2 %   Platelets 200 150 - 400 K/uL   nRBC 0.0 0.0 - 0.2 %  Troponin I (High Sensitivity)  Result Value Ref Range   Troponin I (High Sensitivity) 10 <18 ng/L   DG Chest 2 View  Result Date: 07/14/2023 CLINICAL DATA:  cp EXAM: CHEST - 2 VIEW COMPARISON:  06/24/2023. FINDINGS: Bilateral lung fields are clear. Bilateral costophrenic angles are clear. Stable cardio-mediastinal silhouette. There are surgical staples along the heart border and sternotomy wires, status post CABG (coronary artery bypass graft). No acute osseous abnormalities. The soft tissues are within normal limits. IMPRESSION: No active cardiopulmonary disease. Electronically Signed   By: Jules Schick M.D.   On: 07/14/2023 13:58   DG Ribs Unilateral W/Chest Right  Result Date: 07/03/2023 CLINICAL DATA:  Chronic right rib pain. EXAM: RIGHT RIBS AND CHEST - 3+ VIEW COMPARISON:  None Available. FINDINGS: No fracture or other bone lesions are seen involving the ribs. There is no evidence of pneumothorax or pleural effusion. Both lungs are clear. Heart size and mediastinal contours are within normal limits. IMPRESSION: Negative. Electronically Signed   By: Lupita Raider M.D.   On: 07/03/2023 20:35    EKG EKG Interpretation Date/Time:  Wednesday July 15 2023 06:58:11 EDT Ventricular Rate:  53 PR Interval:  53 QRS Duration:  121 QT Interval:  471 QTC  Calculation: 443 R Axis:   -35  Text Interpretation: Sinus rhythm Left ventricular hypertrophy Confirmed by Cathren Laine ([id number]) on 07/15/2023 7:00:06 AM  Radiology DG Chest 2 View  Result Date: 07/14/2023 CLINICAL DATA:  cp EXAM: CHEST - 2 VIEW COMPARISON:  06/24/2023. FINDINGS: Bilateral lung fields are clear. Bilateral costophrenic angles are clear. Stable cardio-mediastinal silhouette. There are surgical staples along the heart border and sternotomy wires, status post CABG (coronary artery bypass graft). No acute osseous abnormalities. The soft tissues are within normal limits. IMPRESSION: No active cardiopulmonary disease. Electronically Signed   By: Jules Schick M.D.   On: 07/14/2023 13:58    Procedures Procedures    Medications Ordered in ED Medications  acetaminophen (TYLENOL) tablet 1,000 mg (1,000 mg Oral Not Given 07/15/23 0725)  traMADol (ULTRAM) tablet 50 mg (50 mg Oral Given 07/15/23 0728)  potassium chloride SA (KLOR-CON M) CR tablet 40 mEq (40 mEq Oral Given 07/15/23 0911)    ED Course/ Medical Decision Making/ A&P                                 Medical Decision Making Problems Addressed: History of coronary artery disease: chronic illness or injury that poses a threat to life or bodily functions Hx of degenerative disc disease: chronic illness or injury with exacerbation, progression, or side effects of treatment that poses a threat to life or bodily functions Hypokalemia: acute illness or injury Precordial chest pain: acute illness or injury with systemic symptoms that poses a threat to life or bodily functions  Amount and/or Complexity of Data Reviewed Independent  Historian: spouse    Details: hx External Data Reviewed: labs, radiology and notes. Labs: ordered. Decision-making details documented in ED Course. Radiology: ordered and independent interpretation performed. Decision-making details documented in ED Course. ECG/medicine tests: ordered and independent  interpretation performed. Decision-making details documented in ED Course.  Risk OTC drugs. Prescription drug management. Decision regarding hospitalization.   Iv ns. Continuous pulse ox and cardiac monitoring. Labs ordered/sent. Recent imaging reviewed.   Differential diagnosis includes ACS, MSK CP, Gi CP, etc. Dispo decision including potential need for admission considered - will get labs and imaging and reassess.   Reviewed nursing notes and prior charts for additional history. External reports reviewed. Pt with workup yesterday for similar symptoms - cxr neg then, inr 3.5, trop x 2 normal. Additional history from: family.   Cardiac monitor: sinus rhythm, rate 60.  Pt indicates has not taken anything for pain today. Acetaminophen po, ultram po.   Labs reviewed/interpreted by me - trop normal. With recurrent symptoms since yesterday, 3 trops during that time neg and not increasing, felt not c/w acs.  Pt is noted to have hx significant ddd/kyphosis, ?whether chronic recurrent symptoms are musculoskeletal/nerve impingement related.   K sl low, kcl po. Wbc normal, hct c/w baseline.   Recheck, no pain/distress.   Recent Xrays reviewed/interpreted by me - no pna.   Rec close pcp f/u.  Return precautions provided.            Final Clinical Impression(s) / ED Diagnoses Final diagnoses:  Precordial chest pain  Hx of degenerative disc disease  History of coronary artery disease    Rx / DC Orders ED Discharge Orders          Ordered    Ambulatory referral to Cardiology       Comments: If you have not heard from the Cardiology office within the next 72 hours please call [phone].   07/15/23 0915              Cathren Laine, MD 07/15/23 [phone]

## 2023-07-15 NOTE — Patient Instructions (Signed)
Description   HOLD today's dose and then continue taking 1/2 tablet daily EXCEPT 1 tablet on Tuesdays and Thursdays.  Stay consistent with greens each week Recheck INR in 3 weeks.  Coumadin Clinic [phone]

## 2023-07-15 NOTE — Progress Notes (Unsigned)
Cardiology Office Note:    Date:  07/16/2023   ID:  [name], [name] 10/01/1946, MRN [id number]  PCP:  [place], Betty G, MD   [facility] Providers Cardiologist:  Timothy Muss, MD Cardiology APP:  [name], [place] {z  Referring MD: [place], Betty G, MD   Chief Complaint  Patient presents with   Follow-up    Chest pain    History of Present Illness:    [name] is a 77 y.o. male with a hx of CAD s/p CABG in 1996, hypertension, hyperlipidemia, and prior smoking history.  Details of CABG are unclear, but he identified LIMA as one of his grafts.  He moved to [place] from [place] in 2018.  Notes indicated he was diagnosed with a hypercoagulable state at the time of his CABG and has been on Coumadin therapy since that time.  Per Dr. Hoyle Lloyd last note he was found to have a markedly elevated fibrinopeptide A level, mild resistance to protein C and relative aspirin resistance with respect to ADP induced platelet aggregation.  He was last seen 4//24 with main complaint of back pain.  He has not had a heart catheterization since the time of his CABG in 1996, nuclear stress test has been discussed but they have deferred.  On 07/14/23, he presented to neurology for outpatient evaluation but reported chest pain when he was called by triage.  Wife administered a cell NTG and EMS was dispatched. In the ER, he ruled out with negative troponin and was discharged without admission.  Unfortunately he presented back to the ER 07/15/2023 with chest pain that occurred when he was trying to get out of bed.  Chest pain did not wake him from sleep.  He again ruled out with negative troponin and nonischemic EKG.  He was discharged without admission.  He presents today for follow-up.  He reports chest pain was pushing from the inside out at neurology office. CP has been right sided and left sided with radiation to his left neck. He c/o "burning up inside."  They report ongoing  left sided rib pain with problems lying on that side, seeing a chiropractor.  MSK type chest pain has been going on this year, intermittent. CP is improved with resting. He is sedentary, but is able to walk Wife states the chest pain that sent him to the ER x 2 was different than his MSK pain - this was described as crushing chest pain relieved with nitroglycerin.    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 He is unable to lay flat on any table making stress testing or heart catheterization difficult.    Past Medical History:  Diagnosis Date   Allergy    Arthritis    CAD (coronary artery disease) 1996   POST CABG   Cataracts, bilateral    Chicken pox    Dyslipidemia    Hyperlipidemia    Hypertension    Sinus bradycardia     Past Surgical History:  Procedure Laterality Date   BYPASS GRAFT  1996   CATARACT EXTRACTION Right    COLONOSCOPY WITH PROPOFOL N/A 11/20/2022   Procedure: COLONOSCOPY WITH PROPOFOL;  Surgeon: Timothy Fiedler, MD;  Location: [facility] ENDOSCOPY;  Service: Gastroenterology;  Laterality: N/A;   INGUINAL HERNIA REPAIR Right 04/09/2020   Procedure: LAPAROSCOPIC RIGHT INGUINAL HERNIA REPAIR WITH MESH;  Surgeon: Timothy Filler, MD;  Location: [facility] OR;  Service: General;  Laterality: Right;  POLYPECTOMY  11/20/2022   Procedure: POLYPECTOMY;  Surgeon: Timothy Fiedler, MD;  Location: [facility] ENDOSCOPY;  Service: Gastroenterology;;   REFRACTIVE SURGERY Right    SKIN BIOPSY     head x 2   UMBILICAL HERNIA REPAIR  2003    Current Medications: Current Meds  Medication Sig   amLODipine (NORVASC) 5 MG tablet TAKE ONE AND ONE-HALF TABLETS DAILY (Patient taking differently: Take 5 mg by mouth daily.)   cholecalciferol (VITAMIN D) 1000 units tablet Take 1,000 Units by mouth daily.    Cyanocobalamin (VITAMIN B12) 500 MCG TABS Take 500 mcg by mouth daily.    donepezil (ARICEPT) 10 MG tablet TAKE 1 TABLET AT BEDTIME   ezetimibe (ZETIA) 10 MG tablet  TAKE 1 TABLET AT BEDTIME   fenofibrate (TRICOR) 145 MG tablet TAKE 1 TABLET AT BEDTIME   finasteride (PROSCAR) 5 MG tablet Take 5 mg by mouth daily.   isosorbide mononitrate (IMDUR) 30 MG 24 hr tablet Take 1 tablet (30 mg total) by mouth daily.   losartan (COZAAR) 50 MG tablet Take 1 tablet (50 mg total) by mouth daily.   memantine (NAMENDA) 10 MG tablet Take 1 tablet (10 mg total) by mouth 2 (two) times daily.   nitroGLYCERIN (NITROSTAT) 0.4 MG SL tablet Place 1 tablet (0.4 mg total) under the tongue every 5 (five) minutes as needed for chest pain.   ranolazine (RANEXA) 500 MG 12 hr tablet Take 1 tablet (500 mg total) by mouth 2 (two) times daily.   rosuvastatin (CRESTOR) 40 MG tablet TAKE 1 TABLET DAILY   tamsulosin (FLOMAX) 0.4 MG CAPS capsule Take 0.4 mg by mouth daily after supper.   traMADol (ULTRAM) 50 MG tablet Take 1 tablet (50 mg total) by mouth every 12 (twelve) hours as needed. (Patient taking differently: Take 50 mg by mouth See admin instructions. Take 50mg  by mouth 1-2 times a day.)   warfarin (COUMADIN) 5 MG tablet TAKE AS DIRECTED BY COUMADIN CLINIC (Patient taking differently: Take 5 mg by mouth See admin instructions. Take 2.5 mg on Mon,Wed,Fri,Sat,and Sun. 5 mg on Tue and Thu.)   [DISCONTINUED] furosemide (LASIX) 20 MG tablet TAKE 1 TABLET DAILY, MAY TAKE AN EXTRA DOSE 20 MG TWICE A WEEK FOR SWELLING   [DISCONTINUED] furosemide (LASIX) 40 MG tablet Take 1 tablet (40 mg total) by mouth daily.   [DISCONTINUED] losartan (COZAAR) 100 MG tablet Take 1 tablet (100 mg total) by mouth daily.     Allergies:   Other and Latex   Social History   Socioeconomic History   Marital status: Married    Spouse [name]   Number of children: 2   Years of education: COLLEGE   Highest education level: Bachelor's degree (e.Lloyd., BA, AB, BS)  Occupational History   Occupation: RETIRED  Tobacco Use   Smoking status: Former    Current packs/day: 0.00    Types: Cigarettes    Quit date:  05/29/1982    Years since quitting: 41.1   Smokeless tobacco: Never  Vaping Use   Vaping status: Never Used  Substance and Sexual Activity   Alcohol use: Yes    Comment: 0-2 drinks a night   Drug use: No   Sexual activity: Not on file  Other Topics Concern   Not on file  Social History Narrative   Right handed   Caffeine   One story home lives with wife   retired   International aid/development worker of Health   Financial Resource Strain: Low Risk  (12/24/2022)  Overall Financial Resource Strain (CARDIA)    Difficulty of Paying Living Expenses: Not hard at all  Food Insecurity: No Food Insecurity (12/24/2022)   Hunger Vital Sign    Worried About Running Out of Food in the Last Year: Never true    Ran Out of Food in the Last Year: Never true  Transportation Needs: No Transportation Needs (12/24/2022)   PRAPARE - Administrator, Civil Service (Medical): No    Lack of Transportation (Non-Medical): No  Physical Activity: Inactive (12/24/2022)   Exercise Vital Sign    Days of Exercise per Week: 0 days    Minutes of Exercise per Session: 0 min  Stress: No Stress Concern Present (12/24/2022)   [employer] of Occupational Health - Occupational Stress Questionnaire    Feeling of Stress : Not at all  Recent Concern: Stress - Stress Concern Present (10/23/2022)   [employer] of Occupational Health - Occupational Stress Questionnaire    Feeling of Stress : To some extent  Social Connections: Moderately Isolated (12/24/2022)   Social Connection and Isolation Panel [NHANES]    Frequency of Communication with Friends and Family: More than three times a week    Frequency of Social Gatherings with Friends and Family: More than three times a week    Attends Religious Services: Never    Database administrator or Organizations: No    Attends Engineer, structural: Never    Marital Status: Married     Family History: The patient's family history includes AAA (abdominal aortic  aneurysm) in his sister; Alzheimer's disease in his mother; Cancer in his father; Hypertension in his maternal grandmother and paternal grandmother; Prostate cancer in his father. There is no history of Colon cancer, Esophageal cancer, Pancreatic cancer, or Liver disease.  ROS:   Please see the history of present illness.     All other systems reviewed and are negative.  EKGs/Labs/Other Studies Reviewed:    The following studies were reviewed today: none      Recent Labs: 06/24/2023: TSH 1.24 07/14/2023: ALT 28 07/15/2023: BUN 15; Creatinine, Ser 0.93; Hemoglobin 12.6; Platelets 200; Potassium 3.4; Sodium 140  Recent Lipid Panel    Component Value Date/Time   CHOL 122 03/26/2023 0749   TRIG 56 03/26/2023 0749   HDL 62 03/26/2023 0749   CHOLHDL 2.0 03/26/2023 0749   CHOLHDL 2 11/05/2021 0845   VLDL 9.4 11/05/2021 0845   LDLCALC 47 03/26/2023 0749     Risk Assessment/Calculations:                Physical Exam:    VS:  Pulse 70   Ht 6' (1.829 m)   Wt 165 lb (74.8 kg)   SpO2 97%   BMI 22.38 kg/m     Wt Readings from Last 3 Encounters:  07/16/23 165 lb (74.8 kg)  07/07/23 169 lb (76.7 kg)  06/24/23 168 lb 8 oz (76.4 kg)     GEN:  Well nourished, well developed in no acute distress HEENT: Normal NECK: No JVD; No carotid bruits LYMPHATICS: No lymphadenopathy CARDIAC: RRR, no murmurs, rubs, gallops RESPIRATORY:  Clear to auscultation without rales, wheezing or rhonchi  ABDOMEN: Soft, non-tender, non-distended MUSCULOSKELETAL:  No edema; No deformity  SKIN: Warm and dry NEUROLOGIC:  Alert and oriented x 3 PSYCHIATRIC:  Normal affect   ASSESSMENT:    1. Essential hypertension   2. Coronary artery disease involving native coronary artery of native heart with other form of angina pectoris (  HCC)   3. Chest pain of uncertain etiology   4. Hx of CABG   5. Primary hypertension   6. Hyperlipidemia with target LDL less than 70    PLAN:    In order of problems listed  above:  Chest pain - Has had 2 ER visits for chest discomfort - Consider increasing antianginal regimen versus nuclear stress test -Through discussion with he and his wife, he has severe back pain and is unable to lay flat on any table making heart catheterization or even PET stress testing difficult - He is unsure if he could repeat an echocardiogram at this point - I will then increase his antianginal regimen - Continue 5 mg amlodipine, reduce losartan to 50 mg daily, start 30 mg of Imdur at night, start Ranexa 500 mg twice daily   Lower extremity swelling As above, unclear if he would tolerate an echocardiogram - 20 mg of Lasix as needed is not working for them anymore - I have advised 40 mg of Lasix x 3 days then every other day to help with swelling -Lungs are clear on exam, I do not think this is a heart failure exacerbation   CAD s/p CABG 1996 - On Coumadin therapy, no aspirin - has not had an ischemic evaluation   Hypertension - Amlodipine 5 mg, losartan reduced to 50 mg, starting 30 mg imdur   Hyperlipidemia with LDL goal less than 70 Continue 40 mg Crestor, fenofibrate, 2 mg Zetia 03/26/2023: Cholesterol, Total 122; HDL 62; LDL Chol Calc (NIH) 47; Triglycerides 56   Follow up with me in 4-6 weeks. They would like to continue following at [facility]. I will reach out to Dr. Eldridge Dace to see if he has recommended a new primary cardiologist for him yet.       Medication Adjustments/Labs and Tests Ordered: Current medicines are reviewed at length with the patient today.  Concerns regarding medicines are outlined above.  Orders Placed This Encounter  Procedures   EKG 12-Lead   Meds ordered this encounter  Medications   losartan (COZAAR) 50 MG tablet    Sig: Take 1 tablet (50 mg total) by mouth daily.    Dispense:  30 tablet    Refill:  3   isosorbide mononitrate (IMDUR) 30 MG 24 hr tablet    Sig: Take 1 tablet (30 mg total) by mouth daily.    Dispense:  30 tablet     Refill:  3   DISCONTD: furosemide (LASIX) 40 MG tablet    Sig: Take 1 tablet (40 mg total) by mouth daily.    Dispense:  45 tablet    Refill:  1   ranolazine (RANEXA) 500 MG 12 hr tablet    Sig: Take 1 tablet (500 mg total) by mouth 2 (two) times daily.    Dispense:  60 tablet    Refill:  2   furosemide (LASIX) 40 MG tablet    Sig: Take 1 tablet (40 mg total) by mouth every other day.    Dispense:  45 tablet    Refill:  1    Patient Instructions  Medication Instructions:  Decrease Losartan to 50 mg ( Take 1 Tablet Daily). Increase Lasix to 40 mg ( Take 1 Tablet Daily for (3) Three Days. Then Take 1 Tablet Every Other Day). Start Imdur 30 mg ( Take 1/2 Tablet 15 mg for (3) Three Days. Then Take 1 Tablet Daily). Start Ranexa 500 mg ( Take 1 Tablet Twice Daily). *If you need a  refill on your cardiac medications before your next appointment, please call your pharmacy*   Lab Work: No labs If you have labs (blood work) drawn today and your tests are completely normal, you will receive your results only by: MyChart Message (if you have MyChart) OR A paper copy in the mail If you have any lab test that is abnormal or we need to change your treatment, we will call you to review the results.   Testing/Procedures: No Testing   Follow-Up: At [facility], you and your health needs are our priority.  As part of our continuing mission to provide you with exceptional heart care, we have created designated Provider Care Teams.  These Care Teams include your primary Cardiologist (physician) and Advanced Practice Providers (APPs -  Physician Assistants and Nurse Practitioners) who all work together to provide you with the care you need, when you need it.  We recommend signing up for the patient portal called "MyChart".  Sign up information is provided on this After Visit Summary.  MyChart is used to connect with patients for Virtual Visits (Telemedicine).  Patients are able to view  lab/test results, encounter notes, upcoming appointments, etc.  Non-urgent messages can be sent to your provider as well.   To learn more about what you can do with MyChart, go to [email or web address].    Your next appointment:   4-6 week(s)  Provider:   Micah Flesher, PA-C       Signed, [name], [place]  07/16/2023 12:46 PM    [facility]

## 2023-07-16 ENCOUNTER — Encounter: Payer: Self-pay | Admitting: Physician Assistant

## 2023-07-16 ENCOUNTER — Ambulatory Visit: Payer: Medicare Other | Attending: Physician Assistant | Admitting: Physician Assistant

## 2023-07-16 VITALS — HR 70 | Ht 72.0 in | Wt 165.0 lb

## 2023-07-16 DIAGNOSIS — Z951 Presence of aortocoronary bypass graft: Secondary | ICD-10-CM

## 2023-07-16 DIAGNOSIS — I25118 Atherosclerotic heart disease of native coronary artery with other forms of angina pectoris: Secondary | ICD-10-CM | POA: Diagnosis not present

## 2023-07-16 DIAGNOSIS — I10 Essential (primary) hypertension: Secondary | ICD-10-CM | POA: Diagnosis not present

## 2023-07-16 DIAGNOSIS — R079 Chest pain, unspecified: Secondary | ICD-10-CM | POA: Diagnosis not present

## 2023-07-16 DIAGNOSIS — E785 Hyperlipidemia, unspecified: Secondary | ICD-10-CM | POA: Insufficient documentation

## 2023-07-16 MED ORDER — FUROSEMIDE 40 MG PO TABS: 40.0000 mg | ORAL_TABLET | Freq: Every day | ORAL | 1 refills | Status: DC

## 2023-07-16 MED ORDER — ISOSORBIDE MONONITRATE ER 30 MG PO TB24: 30.0000 mg | ORAL_TABLET | Freq: Every day | ORAL | 3 refills | Status: DC

## 2023-07-16 MED ORDER — FUROSEMIDE 40 MG PO TABS: 40.0000 mg | ORAL_TABLET | ORAL | 1 refills | Status: DC

## 2023-07-16 MED ORDER — RANOLAZINE ER 500 MG PO TB12: 500.0000 mg | ORAL_TABLET | Freq: Two times a day (BID) | ORAL | 2 refills | Status: DC

## 2023-07-16 MED ORDER — LOSARTAN POTASSIUM 50 MG PO TABS: 50.0000 mg | ORAL_TABLET | Freq: Every day | ORAL | 3 refills | Status: DC

## 2023-07-16 NOTE — Patient Instructions (Signed)
Medication Instructions:  Decrease Losartan to 50 mg ( Take 1 Tablet Daily). Increase Lasix to 40 mg ( Take 1 Tablet Daily for (3) Three Days. Then Take 1 Tablet Every Other Day). Start Imdur 30 mg ( Take 1/2 Tablet 15 mg for (3) Three Days. Then Take 1 Tablet Daily). Start Ranexa 500 mg ( Take 1 Tablet Twice Daily). *If you need a refill on your cardiac medications before your next appointment, please call your pharmacy*   Lab Work: No labs If you have labs (blood work) drawn today and your tests are completely normal, you will receive your results only by: MyChart Message (if you have MyChart) OR A paper copy in the mail If you have any lab test that is abnormal or we need to change your treatment, we will call you to review the results.   Testing/Procedures: No Testing   Follow-Up: At [facility], you and your health needs are our priority.  As part of our continuing mission to provide you with exceptional heart care, we have created designated Provider Care Teams.  These Care Teams include your primary Cardiologist (physician) and Advanced Practice Providers (APPs -  Physician Assistants and Nurse Practitioners) who all work together to provide you with the care you need, when you need it.  We recommend signing up for the patient portal called "MyChart".  Sign up information is provided on this After Visit Summary.  MyChart is used to connect with patients for Virtual Visits (Telemedicine).  Patients are able to view lab/test results, encounter notes, upcoming appointments, etc.  Non-urgent messages can be sent to your provider as well.   To learn more about what you can do with MyChart, go to [email or web address].    Your next appointment:   4-6 week(s)  Provider:   Micah Flesher, PA-C

## 2023-07-22 ENCOUNTER — Telehealth: Payer: Self-pay

## 2023-07-22 NOTE — Telephone Encounter (Signed)
Transition Care Management Follow-up Telephone Call Date of discharge and from where: 07/15/2023 [facility] How have you been since you were released from the hospital? Patient's wife stated he was still in a lot of pain. Any questions or concerns? No  Items Reviewed: Did the pt receive and understand the discharge instructions provided? Yes  Medications obtained and verified?  No medication prescribed. Other? No  Any new allergies since your discharge? No  Dietary orders reviewed? Yes Do you have support at home? Yes   Follow up appointments reviewed:  PCP Hospital f/u appt confirmed? Yes  Scheduled to see Betty G. [place]il[place], MD on 08/05/2023 @ [facility] [place]. Specialist Hospital f/u appt confirmed? Yes  Scheduled to see Micah Flesher, PA on 07/16/2023 @ [facility]. Are transportation arrangements needed? No  If their condition worsens, is the pt aware to call PCP or go to the Emergency Dept.? Yes Was the patient provided with contact information for the PCP's office or ED? Yes Was to pt encouraged to call back with questions or concerns? Yes  Zackory Pudlo [facility]  [facility] Population Health Community Resource Care Guide   [email or web address]  ?? [phone]   Website: [email or web address]  [email or web address]

## 2023-07-24 ENCOUNTER — Ambulatory Visit: Payer: Medicare Other | Admitting: Physician Assistant

## 2023-07-24 DIAGNOSIS — R310 Gross hematuria: Secondary | ICD-10-CM | POA: Diagnosis not present

## 2023-07-24 DIAGNOSIS — N400 Enlarged prostate without lower urinary tract symptoms: Secondary | ICD-10-CM | POA: Diagnosis not present

## 2023-07-27 ENCOUNTER — Encounter: Payer: Self-pay | Admitting: Physician Assistant

## 2023-07-27 ENCOUNTER — Ambulatory Visit (INDEPENDENT_AMBULATORY_CARE_PROVIDER_SITE_OTHER): Payer: Medicare Other | Admitting: Physician Assistant

## 2023-07-27 VITALS — BP 133/66 | HR 68 | Ht 74.0 in | Wt 167.6 lb

## 2023-07-27 DIAGNOSIS — G301 Alzheimer's disease with late onset: Secondary | ICD-10-CM

## 2023-07-27 DIAGNOSIS — F02818 Dementia in other diseases classified elsewhere, unspecified severity, with other behavioral disturbance: Secondary | ICD-10-CM | POA: Diagnosis not present

## 2023-07-27 DIAGNOSIS — F0280 Dementia in other diseases classified elsewhere without behavioral disturbance: Secondary | ICD-10-CM

## 2023-07-27 DIAGNOSIS — G309 Alzheimer's disease, unspecified: Secondary | ICD-10-CM | POA: Diagnosis not present

## 2023-07-27 NOTE — Patient Instructions (Signed)
It was a pleasure to see you today at our office.   Recommendations:  Follow up in  6 months Continue donepezil 10 mg daily.   Memantine 5 mg tablets  1 tablet twice daily.  Whom to call:  Memory  decline, memory medications: Call our office [phone]   For psychiatric meds, mood meds: Please have your primary care physician manage these medications.      For assessment of decision of mental capacity and competency:  Call Dr. Erick Blinks, geriatric psychiatrist at [phone]  For guidance in geriatric dementia issues please call [facility] [phone]  For guidance regarding [facility] and if placement were needed at the facility, contact Sidney Ace, Social Worker tel: [phone]  If you have any severe symptoms of a stroke, or other severe issues such as confusion,severe chills or fever, etc call 911 or go to the ER as you may need to be evaluated further   Feel free to visit Facebook page " [employer]" for tips of how to care for people with memory problems.   Feel free to go to the following database for funded clinical studies conducted around the world: RankChecks.se   https://www.triadclinicaltrials.com/     RECOMMENDATIONS FOR ALL PATIENTS WITH MEMORY PROBLEMS: 1. Continue to exercise (Recommend 30 minutes of walking everyday, or 3 hours every week) 2. Increase social interactions - continue going to [place] and enjoy social gatherings with friends and family 3. Eat healthy, avoid fried foods and eat more fruits and vegetables 4. Maintain adequate blood pressure, blood sugar, and blood cholesterol level. Reducing the risk of stroke and cardiovascular disease also helps promoting better memory. 5. Avoid stressful situations. Live a simple life and avoid aggravations. Organize your time and prepare for the next day in anticipation. 6. Sleep well, avoid any interruptions of sleep and avoid any distractions in the bedroom  that may interfere with adequate sleep quality 7. Avoid sugar, avoid sweets as there is a strong link between excessive sugar intake, diabetes, and cognitive impairment We discussed the Mediterranean diet, which has been shown to help patients reduce the risk of progressive memory disorders and reduces cardiovascular risk. This includes eating fish, eat fruits and green leafy vegetables, nuts like almonds and hazelnuts, walnuts, and also use olive oil. Avoid fast foods and fried foods as much as possible. Avoid sweets and sugar as sugar use has been linked to worsening of memory function.  There is always a concern of gradual progression of memory problems. If this is the case, then we may need to adjust level of care according to patient needs. Support, both to the patient and caregiver, should then be put into place.    The Alzheimer's Association is here all day, every day for people facing Alzheimer's disease through our free 24/7 Helpline: [phone]. The Helpline provides reliable information and support to all those who need assistance, such as individuals living with memory loss, Alzheimer's or other dementia, caregivers, health care professionals and the public.  Our highly trained and knowledgeable staff can help you with: Understanding memory loss, dementia and Alzheimer's  Medications and other treatment options  General information about aging and brain health  Skills to provide quality care and to find the best care from professionals  Legal, financial and living-arrangement decisions Our Helpline also features: Confidential care consultation provided by master's level clinicians who can help with decision-making support, crisis assistance and education on issues families face every day  Help in a caller's preferred  language using our translation service that features more than 200 languages and dialects  Referrals to local community programs, services and ongoing  support     FALL PRECAUTIONS: Be cautious when walking. Scan the area for obstacles that may increase the risk of trips and falls. When getting up in the mornings, sit up at the edge of the bed for a few minutes before getting out of bed. Consider elevating the bed at the head end to avoid drop of blood pressure when getting up. Walk always in a well-lit room (use night lights in the walls). Avoid area rugs or power cords from appliances in the middle of the walkways. Use a walker or a cane if necessary and consider physical therapy for balance exercise. Get your eyesight checked regularly.  FINANCIAL OVERSIGHT: Supervision, especially oversight when making financial decisions or transactions is also recommended.  HOME SAFETY: Consider the safety of the kitchen when operating appliances like stoves, microwave oven, and blender. Consider having supervision and share cooking responsibilities until no longer able to participate in those. Accidents with firearms and other hazards in the house should be identified and addressed as well.   ABILITY TO BE LEFT ALONE: If patient is unable to contact 911 operator, consider using LifeLine, or when the need is there, arrange for someone to stay with patients. Smoking is a fire hazard, consider supervision or cessation. Risk of wandering should be assessed by caregiver and if detected at any point, supervision and safe proof recommendations should be instituted.  MEDICATION SUPERVISION: Inability to self-administer medication needs to be constantly addressed. Implement a mechanism to ensure safe administration of the medications.   DRIVING: Regarding driving, in patients with progressive memory problems, driving will be impaired. We advise to have someone else do the driving if trouble finding directions or if minor accidents are reported. Independent driving assessment is available to determine safety of driving.   If you are interested in the driving  assessment, you can contact the following:  [employer] in [place] [phone]  [facility] [phone]  [facility] [phone] or [phone]      Mediterranean Diet A Mediterranean diet refers to food and lifestyle choices that are based on the traditions of countries located on the [employer]. This way of eating has been shown to help prevent certain conditions and improve outcomes for people who have chronic diseases, like kidney disease and heart disease. What are tips for following this plan? Lifestyle  Cook and eat meals together with your family, when possible. Drink enough fluid to keep your urine clear or pale yellow. Be physically active every day. This includes: Aerobic exercise like running or swimming. Leisure activities like gardening, walking, or housework. Get 7-8 hours of sleep each night. If recommended by your health care provider, drink red wine in moderation. This means 1 glass a day for nonpregnant women and 2 glasses a day for men. A glass of wine equals 5 oz (150 mL). Reading food labels  Check the serving size of packaged foods. For foods such as rice and pasta, the serving size refers to the amount of cooked product, not dry. Check the total fat in packaged foods. Avoid foods that have saturated fat or trans fats. Check the ingredients list for added sugars, such as corn syrup. Shopping  At the grocery store, buy most of your food from the areas near the walls of the store. This includes: Fresh fruits and vegetables (produce).  Grains, beans, nuts, and seeds. Some of these may be available in unpackaged forms or large amounts (in bulk). Fresh seafood. Poultry and eggs. Low-fat dairy products. Buy whole ingredients instead of prepackaged foods. Buy fresh fruits and vegetables in-season from local farmers markets. Buy frozen fruits and vegetables in resealable bags. If  you do not have access to quality fresh seafood, buy precooked frozen shrimp or canned fish, such as tuna, salmon, or sardines. Buy small amounts of raw or cooked vegetables, salads, or olives from the deli or salad bar at your store. Stock your pantry so you always have certain foods on hand, such as olive oil, canned tuna, canned tomatoes, rice, pasta, and beans. Cooking  Cook foods with extra-virgin olive oil instead of using butter or other vegetable oils. Have meat as a side dish, and have vegetables or grains as your main dish. This means having meat in small portions or adding small amounts of meat to foods like pasta or stew. Use beans or vegetables instead of meat in common dishes like chili or lasagna. Experiment with different cooking methods. Try roasting or broiling vegetables instead of steaming or sauteing them. Add frozen vegetables to soups, stews, pasta, or rice. Add nuts or seeds for added healthy fat at each meal. You can add these to yogurt, salads, or vegetable dishes. Marinate fish or vegetables using olive oil, lemon juice, garlic, and fresh herbs. Meal planning  Plan to eat 1 vegetarian meal one day each week. Try to work up to 2 vegetarian meals, if possible. Eat seafood 2 or more times a week. Have healthy snacks readily available, such as: Vegetable sticks with hummus. Greek yogurt. Fruit and nut trail mix. Eat balanced meals throughout the week. This includes: Fruit: 2-3 servings a day Vegetables: 4-5 servings a day Low-fat dairy: 2 servings a day Fish, poultry, or lean meat: 1 serving a day Beans and legumes: 2 or more servings a week Nuts and seeds: 1-2 servings a day Whole grains: 6-8 servings a day Extra-virgin olive oil: 3-4 servings a day Limit red meat and sweets to only a few servings a month What are my food choices? Mediterranean diet Recommended Grains: Whole-grain pasta. Brown rice. Bulgar wheat. Polenta. Couscous. Whole-wheat bread. Orpah Cobb. Vegetables: Artichokes. Beets. Broccoli. Cabbage. Carrots. Eggplant. Green beans. Chard. Kale. Spinach. Onions. Leeks. Peas. Squash. Tomatoes. Peppers. Radishes. Fruits: Apples. Apricots. Avocado. Berries. Bananas. Cherries. Dates. Figs. Grapes. Lemons. Melon. Oranges. Peaches. Plums. Pomegranate. Meats and other protein foods: Beans. Almonds. Sunflower seeds. Pine nuts. Peanuts. Cod. Salmon. Scallops. Shrimp. Tuna. Tilapia. Clams. Oysters. Eggs. Dairy: Low-fat milk. Cheese. Greek yogurt. Beverages: Water. Red wine. Herbal tea. Fats and oils: Extra virgin olive oil. Avocado oil. Grape seed oil. Sweets and desserts: [place] yogurt with honey. Baked apples. Poached pears. Trail mix. Seasoning and other foods: Basil. Cilantro. Coriander. Cumin. Mint. Parsley. Sage. Rosemary. Tarragon. Garlic. Oregano. Thyme. Pepper. Balsalmic vinegar. Tahini. Hummus. Tomato sauce. Olives. Mushrooms. Limit these Grains: Prepackaged pasta or rice dishes. Prepackaged cereal with added sugar. Vegetables: Deep fried potatoes (french fries). Fruits: Fruit canned in syrup. Meats and other protein foods: Beef. Pork. Lamb. Poultry with skin. Hot dogs. Tomasa Blase. Dairy: Ice cream. Sour cream. Whole milk. Beverages: Juice. Sugar-sweetened soft drinks. Beer. Liquor and spirits. Fats and oils: Butter. Canola oil. Vegetable oil. Beef fat (tallow). Lard. Sweets and desserts: Cookies. Cakes. Pies. Candy. Seasoning and other foods: Mayonnaise. Premade sauces and marinades. The items listed may not be a complete list. Talk with your dietitian  about what dietary choices are right for you. Summary The Mediterranean diet includes both food and lifestyle choices. Eat a variety of fresh fruits and vegetables, beans, nuts, seeds, and whole grains. Limit the amount of red meat and sweets that you eat. Talk with your health care provider about whether it is safe for you to drink red wine in moderation. This means 1 glass a day for  nonpregnant women and 2 glasses a day for men. A glass of wine equals 5 oz (150 mL). This information is not intended to replace advice given to you by your health care provider. Make sure you discuss any questions you have with your health care provider. Document Released: 07/17/2016 Document Revised: 08/19/2016 Document Reviewed: 07/17/2016 Elsevier Interactive Patient Education  2017 [employer].

## 2023-07-27 NOTE — Progress Notes (Signed)
Assessment/Plan:   Dementia likely due to Alzheimer's Disease with behavioral disturbance  [name] is a very pleasant 77 y.o. RH male with a history of CAD status post CABG, osteoarthritis, hypertension, history of adenomatous polyps followed by GI, cataracts seen today in follow up for memory loss. Patient is currently on donepezil 10 mg daily and memantine 5 mg twice daily, tolerating well. Since his last visit, cognitive decline is noted, including sometimes difficulty recognizing his own wife.  Unfortunately, the patient has been having some issues with headaches believed to be due to new medical regimen with nitroglycerin r, and recent visits to the ED with some dizziness, chest wall pain for which increasing memantine is not indicated at this time.  Discussed with his wife the role of this medication, as the risk of late outweighed the benefits of it.  He is now more dependent of ADLs.  His wife is requesting caregiver support information, which was provided to her.   Follow up in 6  months. Continue donepezil 10 mg daily and continue memantine 5 mg daily side effects discussed Recommend good control of her cardiovascular risk factors Continue to control mood as per PCP    Subjective:    This patient is accompanied in the office by his wife  who supplements the history.  Previous records as well as any outside records available were reviewed prior to todays visit. Patient was last seen on 01/09/2023.  He was supposed to be seen on 07/14/2023 chest wall pain at our office lobby, was sent to the ED for further evaluation, will workup negative for cardiac etiology.  The nature was likely musculoskeletal.   Any changes in memory since last visit? " Worse, there is more confusion".  Difficulty remembering recent conversations and new information, names."He does not know me most of the time"-wife says becoming tearful. He likes watching  TCM.  repeats oneself?  Endorsed Disoriented  when walking into a room?  Not always recognizes his house.  Leaving objects in unusual places?  May misplace things especially kitchen utensils.  "He will spend a lot of time looking for things ".    Wandering behavior?  denies   Any personality changes since last visit?  denies  "frustration, he knows something is wrong with him and he wants to know why him"-wife  says Any worsening depression?:  Denies.   Hallucinations or paranoia?  Endorsed.  At times he feels that there is someone else living in the house.  Sees different people, sometimes a crowd. "He placed 6 cups when sitting the table, when there were only 2 of [place] "-wife says Seizures? denies   His wife reports the patient having lower body spasms sometimes, "does not matter what position, not every time ""he sees a chiropractor who seems to help him any sleep changes? He does not sleep well, sometimes he does not go to sleep till the sun comes up".  Denies vivid dreams, REM behavior or sleepwalking   Sleep apnea?   Denies.   Any hygiene concerns?  Most of the time he does sponge bath "because my balance is iffy ".  Independent of bathing and dressing?  Needs assistance. Does the patient needs help with medications?  Wife is in charge   Who is in charge of the finances?  Wife is in charge     Any changes in appetite?  denies     Patient have trouble swallowing? Denies.   Does the patient cook?  Any  headaches?   "Severe frontal headaches different from the neck pain, appearing after starting a new nitro drug by cardiology" Chronic back pain  denies   Ambulates with difficulty?  Endorsed, uses a cane    Recent falls or head injuries? denies, uses a cane    Strokelike symptoms? denies   Any tremors?  Denies   Any anosmia?  Denies   Any incontinence of urine? Endorsed.  Any bowel dysfunction?   Denies      Patient lives with wife  Does the patient drive? No longer drives     HISTORY 12/06/2020: His wife has noted memory problems  since 2020 that has gradually progressed.  He started having trouble calculating the checkbook and would be off by several hundreds of dollars.  He also quickly forgets conversations and would repeat questions after just minutes.  He hasn't driven for years but as a passenger he denies disorientation on familiar routes. On a recent cruise, he had trouble keeping up with the schedule and activities.  He has a system to remember to take his medications independently.  No difficulty with performing home chores or activities.  At first, it was thought to be related to his chronic back/radicular pain but now the memory problems appear to be out of proportion to being in pain.     He has family history of Alzheimer's disease on his mother's side, including his mother, aunts, uncles and grandmother. PREVIOUS MEDICATIONS:   CURRENT MEDICATIONS:  Outpatient Encounter Medications as of 07/27/2023  Medication Sig   amLODipine (NORVASC) 5 MG tablet TAKE ONE AND ONE-HALF TABLETS DAILY (Patient taking differently: Take 5 mg by mouth daily.)   cholecalciferol (VITAMIN D) 1000 units tablet Take 1,000 Units by mouth daily.    Cyanocobalamin (VITAMIN B12) 500 MCG TABS Take 500 mcg by mouth daily.    donepezil (ARICEPT) 10 MG tablet TAKE 1 TABLET AT BEDTIME   ezetimibe (ZETIA) 10 MG tablet TAKE 1 TABLET AT BEDTIME   fenofibrate (TRICOR) 145 MG tablet TAKE 1 TABLET AT BEDTIME   finasteride (PROSCAR) 5 MG tablet Take 5 mg by mouth daily.   furosemide (LASIX) 40 MG tablet Take 1 tablet (40 mg total) by mouth every other day.   isosorbide mononitrate (IMDUR) 30 MG 24 hr tablet Take 1 tablet (30 mg total) by mouth daily.   losartan (COZAAR) 50 MG tablet Take 1 tablet (50 mg total) by mouth daily.   memantine (NAMENDA) 10 MG tablet Take 1 tablet (10 mg total) by mouth 2 (two) times daily.   nitroGLYCERIN (NITROSTAT) 0.4 MG SL tablet Place 1 tablet (0.4 mg total) under the tongue every 5 (five) minutes as needed for chest  pain.   ranolazine (RANEXA) 500 MG 12 hr tablet Take 1 tablet (500 mg total) by mouth 2 (two) times daily.   rosuvastatin (CRESTOR) 40 MG tablet TAKE 1 TABLET DAILY   tamsulosin (FLOMAX) 0.4 MG CAPS capsule Take 0.4 mg by mouth daily after supper.   traMADol (ULTRAM) 50 MG tablet Take 1 tablet (50 mg total) by mouth every 12 (twelve) hours as needed. (Patient taking differently: Take 50 mg by mouth See admin instructions. Take 50mg  by mouth 1-2 times a day.)   warfarin (COUMADIN) 5 MG tablet TAKE AS DIRECTED BY COUMADIN CLINIC (Patient taking differently: Take 5 mg by mouth See admin instructions. Take 2.5 mg on Mon,Wed,Fri,Sat,and Sun. 5 mg on Tue and Thu.)   No facility-administered encounter medications on file as of 07/27/2023.  07/17/2022   12:00 PM 03/23/2018   11:49 AM  MMSE - Mini Mental State Exam  Not completed:  --  Orientation to time 0   Orientation to Place 4   Registration 3   Attention/ Calculation 0   Recall 2   Language- name 2 objects 2   Language- repeat 1   Language- follow 3 step command 3   Language- read & follow direction 1   Write a sentence 0   Copy design 0   Total score 16       10/11/2021    1:00 PM  Montreal Cognitive Assessment   Visuospatial/ Executive (0/5) 0  Naming (0/3) 2  Attention: Read list of digits (0/2) 2  Attention: Read list of letters (0/1) 1  Attention: Serial 7 subtraction starting at 100 (0/3) 2  Language: Repeat phrase (0/2) 0  Language : Fluency (0/1) 1  Abstraction (0/2) 2  Delayed Recall (0/5) 2  Orientation (0/6) 5  Total 17  Adjusted Score (based on education) 17    Objective:     PHYSICAL EXAMINATION:    VITALS:   Vitals:   07/27/23 0919  BP: 133/66  Pulse: 68  SpO2: 94%  Weight: 167 lb 9.6 oz (76 kg)  Height: 6\' 2"  (1.88 m)    GEN:  The patient appears stated age and is in NAD. HEENT:  Normocephalic, atraumatic.   Neurological examination:  General: NAD, well-groomed, appears stated  age. Orientation: The patient is alert. Oriented to person, not to place and date Cranial nerves: There is good facial symmetry.The speech is fluent and clear, but tangential at times. No aphasia or dysarthria. Fund of knowledge is reduced. Recent and remote memory are impaired. Attention and concentration are reduced.  Able to name objects and unable to  repeat phrases.  Hearing is intact to conversational tone.   Sensation: Sensation is intact to light touch throughout Motor: Strength is at least antigravity x4. DTR's 2/4 in UE/LE     Movement examination: Tone: There is normal tone in the UE/LE Abnormal movements:  no tremor.  No myoclonus.  No asterixis.   Coordination:  There is no decremation with RAM's. Normal finger to nose  Gait and Station: The patient has difficulty arising out of a deep-seated chair without the use of the hands, needs a cane. The patient's stride length is shorter, slow.  Gait is cautious and wide based.    Thank you for allowing [place] the opportunity to participate in the care of this nice patient. Please do not hesitate to contact us for any questions or concerns.   Total time spent on today's visit was 37 minutes dedicated to this patient today, preparing to see patient, examining the patient, ordering tests and/or medications and counseling the patient, documenting clinical information in the EHR or other health record, independently interpreting results and communicating results to the patient/family, discussing treatment and goals, answering patient's questions and coordinating care.  Cc:  [place], Betty G, MD  Marlowe Kays 07/27/2023 9:58 AM

## 2023-07-28 ENCOUNTER — Telehealth: Payer: Self-pay | Admitting: Interventional Cardiology

## 2023-07-28 NOTE — Telephone Encounter (Signed)
Called pt's wife. He stares pt has severe degenerated arthritis. She states ever since he has started taking both of these medications (07/16/23) he has been having worse headaches. "They use to stop at his ears now they are to the top of his head and he has to take a Tramadol. The headaches are getting worse." Pt is currently taking Ranexa (second dose) and Imdur at night together. She is wondering if they should be taking separately. His wife wants to know if there is a reaction between any of his medications. She wants to know are there better times to take the medications so he is not taking them all together at night. "I'll try anything, I'll space out meds 4 times a day if necessary." Pt is also getting "violent muscle spasms. They have increased drastically to the point of affecting his ability to walk."  She would like the pharmacy staff to look over the medications list. She states she does not want to mess with his medications on her own accord but is wondering if she should hold Imdur for a day or so to see if his symptoms improve. Will get message to [facility] and Pharm-D for review.

## 2023-07-28 NOTE — Telephone Encounter (Signed)
Pt c/o medication issue:  1. Name of Medication: isosorbide mononitrate (IMDUR) 30 MG 24 hr tablet   ranolazine (RANEXA) 500 MG 12 hr tablet   2. How are you currently taking this medication (dosage and times per day)?    3. Are you having a reaction (difficulty breathing--STAT)? no  4. What is your medication issue? Causing patient to have server headaches. Think he might be having a allergic reaction.Please advise

## 2023-07-29 ENCOUNTER — Encounter: Payer: Self-pay | Admitting: Family Medicine

## 2023-07-29 DIAGNOSIS — M62838 Other muscle spasm: Secondary | ICD-10-CM

## 2023-07-29 DIAGNOSIS — R251 Tremor, unspecified: Secondary | ICD-10-CM

## 2023-07-29 NOTE — Telephone Encounter (Signed)
Ranexa doesn't usually have the same headache issues as isosorbide.  Consider cutting isosorbide to 15 mg or hold off completely and see if ranexa is enough

## 2023-07-29 NOTE — Telephone Encounter (Signed)
Called pt's wife and relayed Kristin's message. She verbalized understanding and she states they will stop the Imdur for now. She will call back in a few days to give an update on pt's symptoms. She thanked me for calling her back.

## 2023-07-29 NOTE — Telephone Encounter (Signed)
  Pt's wife is calling back to follow up. She would like to get a callback today before the day ends

## 2023-07-30 ENCOUNTER — Telehealth: Payer: Self-pay | Admitting: Family Medicine

## 2023-07-30 NOTE — Telephone Encounter (Signed)
[facility] (fax [phone]) cannot find referral sent yesterday. [id number] is to [facility] but patient prefers [facility]

## 2023-07-31 ENCOUNTER — Other Ambulatory Visit (HOSPITAL_BASED_OUTPATIENT_CLINIC_OR_DEPARTMENT_OTHER): Payer: Self-pay

## 2023-07-31 NOTE — Telephone Encounter (Signed)
Agree with Phillips Hay PharmD.  If headaches persist after 2 weeks of imdur, can stop imdur.

## 2023-07-31 NOTE — Telephone Encounter (Signed)
Sent a MyChart message to pt 

## 2023-07-31 NOTE — Addendum Note (Signed)
Addended by: Kathreen Devoid on: 07/31/2023 07:16 AM   Modules accepted: Orders

## 2023-07-31 NOTE — Telephone Encounter (Signed)
Resent

## 2023-08-03 ENCOUNTER — Ambulatory Visit: Payer: Medicare Other | Attending: Cardiology

## 2023-08-03 DIAGNOSIS — D6859 Other primary thrombophilia: Secondary | ICD-10-CM | POA: Diagnosis not present

## 2023-08-03 DIAGNOSIS — Z5181 Encounter for therapeutic drug level monitoring: Secondary | ICD-10-CM | POA: Insufficient documentation

## 2023-08-03 LAB — POCT INR: INR: 2.8 (ref 2.0–3.0)

## 2023-08-03 NOTE — Patient Instructions (Signed)
continue taking 1/2 tablet daily EXCEPT 1 tablet on Tuesdays and Thursdays.  Stay consistent with greens each week Recheck INR in 4 weeks.  [facility] [phone]

## 2023-08-04 NOTE — Progress Notes (Signed)
HPI:  [name] is a 77 y.o. male, who is here today with his wife to follow on recent  visit. He was last seen on 07/07/23, when he reported episodes of gross hematuria.  Since his last visit he has been in the ED, 07/14/23 and 07/15/23, and saw his cardiologist on 07/16/23 and his neurologist on 07/27/23. His wife helps providing hx.  He saw his urologist, Dr. Alvester Morin and an abdominal/pelvic CT is scheduled for tomorrow.  He states that gross  hematuria stopped for a while but has returned, not as severe as it was. No associated dysuria or decreased urine output.  He also reports right rib cage pain he reported in 06/2023 has "tapered off" but occasionally returns, mild.  He had 2 ER visits for this pain and right-sided CP. 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 He was started on ranolazine 500mg  bid and Imdur 30 mg, the latter one caused severe headache, so he discontinued.  HTN on Losartan 50 mg daily and Amlodipine 5 mg daily. He has not had CP,SOB,palpitations,orthopnea,or PND since ED visit. No worsening LE edema or focal weakness. Lab Results  Component Value Date   NA 140 07/15/2023   CL 103 07/15/2023   K 3.4 (L) 07/15/2023   CO2 22 07/15/2023   BUN 15 07/15/2023   CREATININE 0.93 07/15/2023   GFRNONAA >60 07/15/2023   CALCIUM 10.4 (H) 07/15/2023   ALBUMIN 4.0 07/14/2023   GLUCOSE 94 07/15/2023   LE muscle spasm and involuntary movement has worsened, he was evaluated by neurologist and referred to [facility] for further evaluation. He has not identified exacerbating or alleviating factors, occurs during the day and even waking him up when in bed.   Chronic pain, cervicalgia: He has been taking tramadol 50 mg bid prn. He is using ice on back/neck to stretch out time between doses.  Intermittent cervical shooting pain, R>L, radiated to upper back and shoulder sometimes. Marked limitation of ROM.  Review of Systems  Constitutional:  Positive for activity  change and appetite change. Negative for chills and fever.  HENT:  Negative for sore throat and trouble swallowing.   Respiratory:  Negative for cough and wheezing.   Gastrointestinal:  Negative for abdominal pain, nausea and vomiting.  Endocrine: Negative for cold intolerance and heat intolerance.  Musculoskeletal:  Positive for gait problem.  Skin:  Negative for rash.  Neurological:  Negative for syncope and facial asymmetry.  Following with see other pertinent positives and negatives in HPI.  Current Outpatient Medications on File Prior to Visit  Medication Sig Dispense Refill   amLODipine (NORVASC) 5 MG tablet TAKE ONE AND ONE-HALF TABLETS DAILY (Patient taking differently: Take 5 mg by mouth daily.) 135 tablet 3   cholecalciferol (VITAMIN D) 1000 units tablet Take 1,000 Units by mouth daily.      Cyanocobalamin (VITAMIN B12) 500 MCG TABS Take 500 mcg by mouth daily.      donepezil (ARICEPT) 10 MG tablet TAKE 1 TABLET AT BEDTIME 90 tablet 3   ezetimibe (ZETIA) 10 MG tablet TAKE 1 TABLET AT BEDTIME 90 tablet 3   fenofibrate (TRICOR) 145 MG tablet TAKE 1 TABLET AT BEDTIME 90 tablet 3   finasteride (PROSCAR) 5 MG tablet Take 5 mg by mouth daily.     furosemide (LASIX) 40 MG tablet Take 1 tablet (40 mg total) by mouth every other day. 45 tablet 1   isosorbide mononitrate (IMDUR) 30 MG 24 hr tablet Take 1 tablet (  30 mg total) by mouth daily. 30 tablet 3   losartan (COZAAR) 50 MG tablet Take 1 tablet (50 mg total) by mouth daily. 30 tablet 3   memantine (NAMENDA) 10 MG tablet Take 1 tablet (10 mg total) by mouth 2 (two) times daily. 180 tablet 1   nitroGLYCERIN (NITROSTAT) 0.4 MG SL tablet Place 1 tablet (0.4 mg total) under the tongue every 5 (five) minutes as needed for chest pain. 75 tablet 2   ranolazine (RANEXA) 500 MG 12 hr tablet Take 1 tablet (500 mg total) by mouth 2 (two) times daily. 60 tablet 2   rosuvastatin (CRESTOR) 40 MG tablet TAKE 1 TABLET DAILY 90 tablet 3   tamsulosin  (FLOMAX) 0.4 MG CAPS capsule Take 0.4 mg by mouth daily after supper.     traMADol (ULTRAM) 50 MG tablet Take 1 tablet (50 mg total) by mouth every 12 (twelve) hours as needed. (Patient taking differently: Take 50 mg by mouth See admin instructions. Take 50mg  by mouth 1-2 times a day.) 60 tablet 2   warfarin (COUMADIN) 5 MG tablet TAKE AS DIRECTED BY COUMADIN CLINIC (Patient taking differently: Take 5 mg by mouth See admin instructions. Take 2.5 mg on Mon,Wed,Fri,Sat,and Sun. 5 mg on Tue and Thu.) 80 tablet 3   No current facility-administered medications on file prior to visit.    Past Medical History:  Diagnosis Date   Allergy    Arthritis    CAD (coronary artery disease) 1996   POST CABG   Cataracts, bilateral    Chicken pox    Dyslipidemia    Hyperlipidemia    Hypertension    Sinus bradycardia    Allergies  Allergen Reactions   Other     Environmental allergies   Latex     Itchy hands    Social History   Socioeconomic History   Marital status: Married    Spouse name: [name]   Number of children: 2   Years of education: COLLEGE   Highest education level: Bachelor's degree (e.g., BA, AB, BS)  Occupational History   Occupation: RETIRED  Tobacco Use   Smoking status: Former    Current packs/day: 0.00    Types: Cigarettes    Quit date: 05/29/1982    Years since quitting: 41.2   Smokeless tobacco: Never  Vaping Use   Vaping status: Never Used  Substance and Sexual Activity   Alcohol use: Yes    Comment: 0-2 drinks a night   Drug use: No   Sexual activity: Not on file  Other Topics Concern   Not on file  Social History Narrative   Right handed   Caffeine   One story home lives with wife   retired   Chief Executive Officer Determinants of Corporate investment banker Strain: Low Risk  (12/24/2022)   Overall Financial Resource Strain (CARDIA)    Difficulty of Paying Living Expenses: Not hard at all  Food Insecurity: No Food Insecurity (12/24/2022)   Hunger Vital Sign     Worried About Running Out of Food in the Last Year: Never true    Ran Out of Food in the Last Year: Never true  Transportation Needs: No Transportation Needs (12/24/2022)   PRAPARE - Administrator, Civil Service (Medical): No    Lack of Transportation (Non-Medical): No  Physical Activity: Inactive (12/24/2022)   Exercise Vital Sign    Days of Exercise per Week: 0 days    Minutes of Exercise per Session: 0 min  Stress:  No Stress Concern Present (12/24/2022)   [employer] of Occupational Health - Occupational Stress Questionnaire    Feeling of Stress : Not at all  Recent Concern: Stress - Stress Concern Present (10/23/2022)   [employer] of Occupational Health - Occupational Stress Questionnaire    Feeling of Stress : To some extent  Social Connections: Moderately Isolated (12/24/2022)   Social Connection and Isolation Panel [NHANES]    Frequency of Communication with Friends and Family: More than three times a week    Frequency of Social Gatherings with Friends and Family: More than three times a week    Attends Religious Services: Never    Database administrator or Organizations: No    Attends Banker Meetings: Never    Marital Status: Married    Vitals:   08/05/23 0921  BP: 126/70  Pulse: 65  Resp: 16  SpO2: 97%   Wt Readings from Last 3 Encounters:  08/05/23 158 lb 4 oz (71.8 kg)  07/27/23 167 lb 9.6 oz (76 kg)  07/16/23 165 lb (74.8 kg)   Body mass index is 20.32 kg/m.  Physical Exam Vitals and nursing note reviewed.  Constitutional:      General: He is not in acute distress.    Appearance: He is well-developed.  HENT:     Head: Normocephalic and atraumatic.  Eyes:     Conjunctiva/sclera: Conjunctivae normal.  Cardiovascular:     Rate and Rhythm: Normal rate and regular rhythm.     Heart sounds: No murmur heard.    Comments: Periankle 2+ pitting edema, trace pitting LE edema, bilateral. Pulmonary:     Effort: Pulmonary effort  is normal. No respiratory distress.     Breath sounds: Normal breath sounds.  Abdominal:     Palpations: Abdomen is soft.     Tenderness: There is no abdominal tenderness.  Musculoskeletal:     Cervical back: Pain with movement present. Decreased range of motion.     Thoracic back: Deformity (kyphosis) present.  Skin:    General: Skin is warm.     Findings: No erythema or rash.  Neurological:     Mental Status: He is alert. Mental status is at baseline.     Comments: Unstable, antalgic gait, assisted with a cane.  Psychiatric:        Mood and Affect: Mood and affect normal.   ASSESSMENT AND PLAN:  Mr. Jamahri "Rocky Link" was seen today for medical management of chronic issues.  Diagnoses and all orders for this visit:  Generalized osteoarthritis of multiple sites Assessment & Plan: Continue tramadol 50 mg twice daily as needed, which he takes mainly for cervical and upper back pain. We discussed some side effects of medications, so far he has tolerated well. PDMP reviewed. Will plan on renewing med contract next visit. Follow-up in 5 to 6 months, before if needed.  Essential hypertension Assessment & Plan: BP is adequately controlled. Continue losartan 100 mg daily and Amlodipine 5 mg daily. Low-salt diet to continue.   Gross hematuria Following with urologist. He has lost more wt since his last visit,about 10-11 Lb, some decreased in oral intake due to pain.  We discussed possible etiologies, malignancy is a concerned. Tomorrow he is scheduled for abdomen/pelvic CT. Urologist's notes reviewed and the plan is to perform a cystoscopy next visit.  Coronary artery disease involving native coronary artery of native heart with angina pectoris [facility]) Assessment & Plan: Recently added ranolazine 500 mg twice daily. He did not tolerate  a Imdur 30 mg. Currently on amlodipine 5 mg daily, Zetia 10 mg daily, and rosuvastatin 40 mg daily. Next appointment with cardiologist on  08/19/2023.  Return in about 26 weeks (around 02/03/2024) for chronic problems.  Jeilyn Reznik G. [place], MD  [facility]. [place] office.

## 2023-08-05 ENCOUNTER — Ambulatory Visit: Payer: Medicare Other | Admitting: Family Medicine

## 2023-08-05 ENCOUNTER — Encounter: Payer: Self-pay | Admitting: Family Medicine

## 2023-08-05 VITALS — BP 126/70 | HR 65 | Resp 16 | Ht 74.0 in | Wt 158.2 lb

## 2023-08-05 DIAGNOSIS — M159 Polyosteoarthritis, unspecified: Secondary | ICD-10-CM

## 2023-08-05 DIAGNOSIS — I25119 Atherosclerotic heart disease of native coronary artery with unspecified angina pectoris: Secondary | ICD-10-CM | POA: Diagnosis not present

## 2023-08-05 DIAGNOSIS — R310 Gross hematuria: Secondary | ICD-10-CM | POA: Diagnosis not present

## 2023-08-05 DIAGNOSIS — I10 Essential (primary) hypertension: Secondary | ICD-10-CM | POA: Diagnosis not present

## 2023-08-05 NOTE — Patient Instructions (Addendum)
A few things to remember from today's visit:  No changes today. Continue Tramadol 2 times daily as needed. Keep appt with neuro,urologist,and cardiologist. I will see you back in 6 months for pain follow up, before if needed.  If you need refills for medications you take chronically, please call your pharmacy. Do not use My Chart to request refills or for acute issues that need immediate attention. If you send a my chart message, it may take a few days to be addressed, specially if I am not in the office.  Please be sure medication list is accurate. If a new problem present, please set up appointment sooner than planned today.

## 2023-08-05 NOTE — Progress Notes (Signed)
Cardiology Office Note:    Date:  08/19/2023   ID:  [name], DOB 03-Apr-1946, MRN [id number]  PCP:  [place], Betty G, MD   [facility] Providers Cardiologist:  Lance Muss, MD Cardiology APP:  Marcelino Duster, [place]     Referring MD: [place], Betty G, MD   Chief Complaint  Patient presents with   Follow-up    Chest pain    History of Present Illness:    [name] is a 77 y.o. male with a hx of CAD s/p CABG in 1996, hypertension, hyperlipidemia, and prior smoking history.  Details of CABG are unclear, but he identified LIMA as one of his grafts.  He moved to [place] from [place] in 2018.  Notes indicated he was diagnosed with a hypercoagulable state at the time of his CABG and has been on Coumadin therapy since that time.  Per Dr. Hoyle Barr last note he was found to have a markedly elevated fibrinopeptide A level, mild resistance to protein C and relative aspirin resistance with respect to ADP induced platelet aggregation.  He was last seen 4//24 with main complaint of back pain.  He has not had a heart catheterization since the time of his CABG in 1996, nuclear stress test has been discussed but they have deferred.  On 07/14/23, he presented to neurology for outpatient evaluation but reported chest pain when he was called by triage.  Wife administered a cell NTG and EMS was dispatched. In the ER, he ruled out with negative troponin and was discharged without admission.  Unfortunately he presented back to the ER 07/15/2023 with chest pain that occurred when he was trying to get out of bed.  Chest pain did not wake him from sleep.  He again ruled out with negative troponin and nonischemic EKG.  He was discharged without admission.  I saw him for follow up 07/16/23. He has very back arthritis in his back that significantly alters his posture. We discussed MSK CP vs angina. He stated he would be unable to lay flat on any table, includin for heart cath, stress  test, or even echocardiogram. I opted to increase his anti-anginal regimen with starting 30 mg imdur at night and ranexa 500 mg BID.   He returns today for follow up. He did not tolerate imdur due to debilitating headaches. He states his chest pain has resolved, which is great news.    Past Medical History:  Diagnosis Date   Allergy    Arthritis    CAD (coronary artery disease) 1996   POST CABG   Cataracts, bilateral    Chicken pox    Dyslipidemia    Hyperlipidemia    Hypertension    Sinus bradycardia     Past Surgical History:  Procedure Laterality Date   BYPASS GRAFT  1996   CATARACT EXTRACTION Right    COLONOSCOPY WITH PROPOFOL N/A 11/20/2022   Procedure: COLONOSCOPY WITH PROPOFOL;  Surgeon: Beverley Fiedler, MD;  Location: [facility] ENDOSCOPY;  Service: Gastroenterology;  Laterality: N/A;   INGUINAL HERNIA REPAIR Right 04/09/2020   Procedure: LAPAROSCOPIC RIGHT INGUINAL HERNIA REPAIR WITH MESH;  Surgeon: Axel Filler, MD;  Location: [facility] OR;  Service: General;  Laterality: Right;   POLYPECTOMY  11/20/2022   Procedure: POLYPECTOMY;  Surgeon: Beverley Fiedler, MD;  Location: [facility] ENDOSCOPY;  Service: Gastroenterology;;   REFRACTIVE SURGERY Right    SKIN BIOPSY     head x 2   UMBILICAL HERNIA REPAIR  2003  Current Medications: Current Meds  Medication Sig   amLODipine (NORVASC) 5 MG tablet TAKE ONE AND ONE-HALF TABLETS DAILY (Patient taking differently: Take 5 mg by mouth daily.)   cholecalciferol (VITAMIN D) 1000 units tablet Take 1,000 Units by mouth daily.    Cyanocobalamin (VITAMIN B12) 500 MCG TABS Take 500 mcg by mouth daily.    donepezil (ARICEPT) 10 MG tablet TAKE 1 TABLET AT BEDTIME   ezetimibe (ZETIA) 10 MG tablet TAKE 1 TABLET AT BEDTIME   fenofibrate (TRICOR) 145 MG tablet TAKE 1 TABLET AT BEDTIME   finasteride (PROSCAR) 5 MG tablet Take 5 mg by mouth daily.   furosemide (LASIX) 40 MG tablet Take 1 tablet (40 mg total) by mouth every other day.   losartan (COZAAR) 50 MG  tablet Take 1 tablet (50 mg total) by mouth daily.   memantine (NAMENDA) 10 MG tablet Take 1 tablet (10 mg total) by mouth 2 (two) times daily.   nitroGLYCERIN (NITROSTAT) 0.4 MG SL tablet Place 1 tablet (0.4 mg total) under the tongue every 5 (five) minutes as needed for chest pain.   ranolazine (RANEXA) 500 MG 12 hr tablet Take 1 tablet (500 mg total) by mouth 2 (two) times daily.   rosuvastatin (CRESTOR) 40 MG tablet TAKE 1 TABLET DAILY   tamsulosin (FLOMAX) 0.4 MG CAPS capsule Take 0.4 mg by mouth daily after supper.   traMADol (ULTRAM) 50 MG tablet Take 1 tablet (50 mg total) by mouth every 12 (twelve) hours as needed. (Patient taking differently: Take 50 mg by mouth See admin instructions. Take 50mg  by mouth 1-2 times a day.)   warfarin (COUMADIN) 5 MG tablet TAKE AS DIRECTED BY COUMADIN CLINIC (Patient taking differently: Take 5 mg by mouth See admin instructions. Take 2.5 mg on Mon,Wed,Fri,Sat,and Sun. 5 mg on Tue and Thu.)     Allergies:   Other, Imdur [isosorbide nitrate], and Latex   Social History   Socioeconomic History   Marital status: Married    Spouse name: [name]   Number of children: 2   Years of education: COLLEGE   Highest education level: Bachelor's degree (e.g., BA, AB, BS)  Occupational History   Occupation: RETIRED  Tobacco Use   Smoking status: Former    Current packs/day: 0.00    Types: Cigarettes    Quit date: 05/29/1982    Years since quitting: 41.2   Smokeless tobacco: Never  Vaping Use   Vaping status: Never Used  Substance and Sexual Activity   Alcohol use: Yes    Comment: 0-2 drinks a night   Drug use: No   Sexual activity: Not on file  Other Topics Concern   Not on file  Social History Narrative   Right handed   Caffeine   One story home lives with wife   retired   Chief Executive Officer Determinants of Corporate investment banker Strain: Low Risk  (12/24/2022)   Overall Financial Resource Strain (CARDIA)    Difficulty of Paying Living Expenses: Not hard  at all  Food Insecurity: No Food Insecurity (12/24/2022)   Hunger Vital Sign    Worried About Running Out of Food in the Last Year: Never true    Ran Out of Food in the Last Year: Never true  Transportation Needs: No Transportation Needs (12/24/2022)   PRAPARE - Administrator, Civil Service (Medical): No    Lack of Transportation (Non-Medical): No  Physical Activity: Inactive (12/24/2022)   Exercise Vital Sign    Days of Exercise  per Week: 0 days    Minutes of Exercise per Session: 0 min  Stress: No Stress Concern Present (12/24/2022)   [employer] of Occupational Health - Occupational Stress Questionnaire    Feeling of Stress : Not at all  Recent Concern: Stress - Stress Concern Present (10/23/2022)   [employer] of Occupational Health - Occupational Stress Questionnaire    Feeling of Stress : To some extent  Social Connections: Moderately Isolated (12/24/2022)   Social Connection and Isolation Panel [NHANES]    Frequency of Communication with Friends and Family: More than three times a week    Frequency of Social Gatherings with Friends and Family: More than three times a week    Attends Religious Services: Never    Database administrator or Organizations: No    Attends Engineer, structural: Never    Marital Status: Married     Family History: The patient's family history includes AAA (abdominal aortic aneurysm) in his sister; Alzheimer's disease in his mother; Cancer in his father; Hypertension in his maternal grandmother and paternal grandmother; Prostate cancer in his father. There is no history of Colon cancer, Esophageal cancer, Pancreatic cancer, or Liver disease.  ROS:   Please see the history of present illness.     All other systems reviewed and are negative.  EKGs/Labs/Other Studies Reviewed:    The following studies were reviewed today:  None       Recent Labs: 06/24/2023: TSH 1.24 07/14/2023: ALT 28 07/15/2023: BUN 15; Creatinine,  Ser 0.93; Hemoglobin 12.6; Platelets 200; Potassium 3.4; Sodium 140  Recent Lipid Panel    Component Value Date/Time   CHOL 122 03/26/2023 0749   TRIG 56 03/26/2023 0749   HDL 62 03/26/2023 0749   CHOLHDL 2.0 03/26/2023 0749   CHOLHDL 2 11/05/2021 0845   VLDL 9.4 11/05/2021 0845   LDLCALC 47 03/26/2023 0749     Risk Assessment/Calculations:                Physical Exam:    VS:  BP 100/64 (BP Location: Left Arm, Patient Position: Sitting, Cuff Size: Normal)   Pulse 87   Ht 6\' 1"  (1.854 m)   Wt 158 lb (71.7 kg)   SpO2 97%   BMI 20.85 kg/m     Wt Readings from Last 3 Encounters:  08/19/23 158 lb (71.7 kg)  08/12/23 152 lb 6 oz (69.1 kg)  08/05/23 158 lb 4 oz (71.8 kg)     GEN:  Well nourished, well developed in no acute distress HEENT: Normal NECK: No JVD; No carotid bruits LYMPHATICS: No lymphadenopathy CARDIAC: RRR, no murmurs, rubs, gallops RESPIRATORY:  Clear to auscultation without rales, wheezing or rhonchi  ABDOMEN: Soft, non-tender, non-distended MUSCULOSKELETAL:  No edema; No deformity  SKIN: Warm and dry NEUROLOGIC:  Alert and oriented x 3 PSYCHIATRIC:  Normal affect   ASSESSMENT:    1. Chest pain of uncertain etiology   2. Hx of CABG   3. Primary hypertension   4. Mixed hyperlipidemia    PLAN:    In order of problems listed above:  Chest pain - antianginal regimen now includes amlodipine and ranexa - unable to tolerate laying flat on a table, so could not do a stress test or repeat heart catheterization - is tolerating ranexa with no recurrence of chest pain, did not tolerate imdur   CAD s/p CABG 1996 - On Coumadin therapy, no aspirin - has not had an ischemic evaluation   Hypertension - Amlodipine  5 mg, losartan 50 mg, 30 mg imdur - did not tolerate imdur   Hyperlipidemia with LDL goal less than 70 Continue 40 mg Crestor, fenofibrate, 10 mg Zetia 03/26/2023: Cholesterol, Total 122; HDL 62; LDL Chol Calc (NIH) 47; Triglycerides  56   They ask about a new cardiologist and will research options and get back to me. I will put in for a 5-6 month follow up with me, but when they decide on a new cardiologits, I will request an appt with that provider instead of with me so they can meet.            Medication Adjustments/Labs and Tests Ordered: Current medicines are reviewed at length with the patient today.  Concerns regarding medicines are outlined above.  No orders of the defined types were placed in this encounter.  No orders of the defined types were placed in this encounter.   Patient Instructions  Medication Instructions:  Stop Imdur *If you need a refill on your cardiac medications before your next appointment, please call your pharmacy*   Lab Work: No Labs If you have labs (blood work) drawn today and your tests are completely normal, you will receive your results only by: MyChart Message (if you have MyChart) OR A paper copy in the mail If you have any lab test that is abnormal or we need to change your treatment, we will call you to review the results.   Testing/Procedures: No Testing   Follow-Up: At [facility], you and your health needs are our priority.  As part of our continuing mission to provide you with exceptional heart care, we have created designated Provider Care Teams.  These Care Teams include your primary Cardiologist (physician) and Advanced Practice Providers (APPs -  Physician Assistants and Nurse Practitioners) who all work together to provide you with the care you need, when you need it.  We recommend signing up for the patient portal called "MyChart".  Sign up information is provided on this After Visit Summary.  MyChart is used to connect with patients for Virtual Visits (Telemedicine).  Patients are able to view lab/test results, encounter notes, upcoming appointments, etc.  Non-urgent messages can be sent to your provider as well.   To learn more about what you can  do with MyChart, go to [email or web address].    Your next appointment:   6 month(s)  Provider:   Micah Flesher, PA-C      Signed, Roe Rutherford Aivy Akter, [place]  08/19/2023 10:33 AM     [facility]

## 2023-08-06 DIAGNOSIS — N281 Cyst of kidney, acquired: Secondary | ICD-10-CM | POA: Diagnosis not present

## 2023-08-06 DIAGNOSIS — R310 Gross hematuria: Secondary | ICD-10-CM | POA: Diagnosis not present

## 2023-08-06 DIAGNOSIS — N200 Calculus of kidney: Secondary | ICD-10-CM | POA: Diagnosis not present

## 2023-08-06 DIAGNOSIS — R932 Abnormal findings on diagnostic imaging of liver and biliary tract: Secondary | ICD-10-CM | POA: Diagnosis not present

## 2023-08-07 ENCOUNTER — Emergency Department (HOSPITAL_BASED_OUTPATIENT_CLINIC_OR_DEPARTMENT_OTHER)
Admission: EM | Admit: 2023-08-07 | Discharge: 2023-08-07 | Disposition: A | Discharge status: Home / Self Care | Payer: Medicare Other | Attending: Emergency Medicine | Admitting: Emergency Medicine

## 2023-08-07 ENCOUNTER — Telehealth: Payer: Self-pay | Admitting: Internal Medicine

## 2023-08-07 ENCOUNTER — Emergency Department (HOSPITAL_BASED_OUTPATIENT_CLINIC_OR_DEPARTMENT_OTHER): Payer: Medicare Other | Admitting: Radiology

## 2023-08-07 ENCOUNTER — Other Ambulatory Visit: Payer: Self-pay

## 2023-08-07 ENCOUNTER — Encounter (HOSPITAL_BASED_OUTPATIENT_CLINIC_OR_DEPARTMENT_OTHER): Payer: Self-pay | Admitting: Emergency Medicine

## 2023-08-07 DIAGNOSIS — Z7901 Long term (current) use of anticoagulants: Secondary | ICD-10-CM | POA: Insufficient documentation

## 2023-08-07 DIAGNOSIS — I10 Essential (primary) hypertension: Secondary | ICD-10-CM | POA: Insufficient documentation

## 2023-08-07 DIAGNOSIS — Z91040 Latex allergy status: Secondary | ICD-10-CM | POA: Insufficient documentation

## 2023-08-07 DIAGNOSIS — K5900 Constipation, unspecified: Secondary | ICD-10-CM | POA: Diagnosis present

## 2023-08-07 DIAGNOSIS — Z79899 Other long term (current) drug therapy: Secondary | ICD-10-CM | POA: Insufficient documentation

## 2023-08-07 DIAGNOSIS — K5641 Fecal impaction: Secondary | ICD-10-CM | POA: Diagnosis not present

## 2023-08-07 DIAGNOSIS — F0390 Unspecified dementia without behavioral disturbance: Secondary | ICD-10-CM | POA: Insufficient documentation

## 2023-08-07 MED ORDER — OXYCODONE HCL 5 MG PO TABS
5.0000 mg | ORAL_TABLET | ORAL | Status: AC
  Administered 2023-08-07: 5 mg via ORAL
  Filled 2023-08-07: qty 1

## 2023-08-07 MED ORDER — LIDOCAINE HCL URETHRAL/MUCOSAL 2 % EX GEL
1.0000 | Freq: Once | CUTANEOUS | Status: AC
  Administered 2023-08-07: 1
  Filled 2023-08-07: qty 11

## 2023-08-07 MED ORDER — FLEET ENEMA RE ENEM
1.0000 | ENEMA | Freq: Once | RECTAL | Status: AC
  Administered 2023-08-07: 1 via RECTAL
  Filled 2023-08-07: qty 1

## 2023-08-07 NOTE — Telephone Encounter (Signed)
Pt is "impacted" and does not know what to do.  He has tried miralax with no relief

## 2023-08-07 NOTE — ED Provider Notes (Signed)
[name] Provider Note   CSN: [id number] Arrival date & time: 08/07/23  1250     History Chief Complaint  Patient presents with   Constipation    [name] is a 77 y.o. male history of hypercoagulable state on Coumadin, dementia, hypertension, hyperlipidemia presents the emergency room today for evaluation of constipation.  Wife reports that she thinks he may have had a bowel movement on Monday or Saturday, due to his memory problems he is not sure.  He is complaining about rectal pain.  Will occasionally have some suprapubic abdominal pain.  He did have a CT scan done yesterday with urology unrelated to this.  He called his primary care doctor with his symptoms and it was encouraged to come into the emergency department for fecal disimpaction.  Denies any fevers or any dysuria.  He has tried 2 days of MiraLAX without much relief.  No nausea or vomiting.  Does have a decreased appetite today.  Denies any fever or any recent cough or cold symptoms.  No known drug allergies.   Constipation Associated symptoms: abdominal pain (Suprapubic cramping occasionally)   Associated symptoms: no dysuria, no fever, no nausea and no vomiting        Home Medications Prior to Admission medications   Medication Sig Start Date End Date Taking? Authorizing Provider  amLODipine (NORVASC) 5 MG tablet TAKE ONE AND ONE-HALF TABLETS DAILY Patient taking differently: Take 5 mg by mouth daily. 01/21/23   Timothy Crafts, MD  cholecalciferol (VITAMIN D) 1000 units tablet Take 1,000 Units by mouth daily.     [provider]  Cyanocobalamin (VITAMIN B12) 500 MCG TABS Take 500 mcg by mouth daily.     [provider]  donepezil (ARICEPT) 10 MG tablet TAKE 1 TABLET AT BEDTIME 09/01/22   Timothy Eke, PA-C  ezetimibe (ZETIA) 10 MG tablet TAKE 1 TABLET AT BEDTIME 05/18/23   Timothy Crafts, MD  fenofibrate (TRICOR) 145 MG tablet TAKE 1  TABLET AT BEDTIME 03/31/23   Timothy Crafts, MD  finasteride (PROSCAR) 5 MG tablet Take 5 mg by mouth daily. 06/12/23   [provider]  furosemide (LASIX) 40 MG tablet Take 1 tablet (40 mg total) by mouth every other day. 07/16/23 10/14/23  Marcelino Duster, PA  isosorbide mononitrate (IMDUR) 30 MG 24 hr tablet Take 1 tablet (30 mg total) by mouth daily. 07/16/23   Timothy, Roe Rutherford, PA  losartan (COZAAR) 50 MG tablet Take 1 tablet (50 mg total) by mouth daily. 07/16/23 10/14/23  Marcelino Duster, PA  memantine (NAMENDA) 10 MG tablet Take 1 tablet (10 mg total) by mouth 2 (two) times daily. 02/18/23   Timothy Eke, PA-C  nitroGLYCERIN (NITROSTAT) 0.4 MG SL tablet Place 1 tablet (0.4 mg total) under the tongue every 5 (five) minutes as needed for chest pain. 01/24/22   Timothy Crafts, MD  ranolazine (RANEXA) 500 MG 12 hr tablet Take 1 tablet (500 mg total) by mouth 2 (two) times daily. 07/16/23   Marcelino Duster, PA  rosuvastatin (CRESTOR) 40 MG tablet TAKE 1 TABLET DAILY 05/18/23   Timothy Crafts, MD  tamsulosin (FLOMAX) 0.4 MG CAPS capsule Take 0.4 mg by mouth daily after supper.    [provider]  traMADol (ULTRAM) 50 MG tablet Take 1 tablet (50 mg total) by mouth every 12 (twelve) hours as needed. Patient taking differently: Take 50 mg by mouth See admin instructions. Take  50mg  by mouth 1-2 times a day. 06/23/23   Lloyd, Timothy G, MD  warfarin (COUMADIN) 5 MG tablet TAKE AS DIRECTED BY COUMADIN CLINIC Patient taking differently: Take 5 mg by mouth See admin instructions. Take 2.5 mg on Mon,Wed,Fri,Sat,and Sun. 5 mg on Tue and Thu. 08/08/22   Timothy Crafts, MD      Allergies    Other and Latex    Review of Systems   Review of Systems  Unable to perform ROS: Dementia  Constitutional:  Negative for chills and fever.  Respiratory:  Negative for shortness of breath.   Cardiovascular:  Negative for chest pain.  Gastrointestinal:  Positive for abdominal  pain (Suprapubic cramping occasionally), constipation and rectal pain. Negative for nausea and vomiting.  Genitourinary:  Negative for difficulty urinating and dysuria.    Physical Exam Updated Vital Signs BP (!) 154/78 (BP Location: Right Arm)   Pulse 60   Temp 97.7 F (36.5 C)   Resp 20   SpO2 100%  Physical Exam Vitals and nursing note reviewed. Exam conducted with a chaperone present Soil scientist).  Constitutional:      Appearance: He is not toxic-appearing.     Comments: Uncomfortable, but nontoxic-appearing  HENT:     Mouth/Throat:     Mouth: Mucous membranes are moist.  Eyes:     General: No scleral icterus. Cardiovascular:     Rate and Rhythm: Normal rate.  Pulmonary:     Effort: Pulmonary effort is normal. No respiratory distress.  Abdominal:     Palpations: Abdomen is soft.     Tenderness: There is abdominal tenderness. There is no guarding or rebound.     Comments: Mild very low suprapubic tenderness to palpation.  Genitourinary:    Comments: Some fecal smears noted on brief.  Rectal tone intact. Skin:    General: Skin is warm and dry.  Neurological:     Mental Status: He is alert.     ED Results / Procedures / Treatments   Labs (all labs ordered are listed, but only abnormal results are displayed) Labs Reviewed - No data to display  EKG None  Radiology DG Abd 1 View  Result Date: 08/07/2023 CLINICAL DATA:  Constipation/fecal impaction EXAM: ABDOMEN - 1 VIEW COMPARISON:  CT abdomen and pelvis dated 08/06/2023 FINDINGS: Excreted contrast material within the urinary bladder. Nonobstructive bowel gas pattern. Gas-filled bowel loops throughout the abdomen. No free air or pneumatosis. Large volume stool in the rectum. No abnormal radio-opaque calculi or mass effect. No acute or substantial osseous abnormality. The sacrum and coccyx are partially obscured by overlying bowel contents. Partially imaged median sternotomy wires are nondisplaced. Unchanged fracture of the  inferior most wire. Surgical clips project over the lower pelvis. IMPRESSION: Large volume stool in the rectum. Nonobstructive bowel gas pattern. Electronically Signed   By: Agustin Cree M.D.   On: 08/07/2023 15:54    Procedures Procedures   Medications Ordered in ED Medications  oxyCODONE (Oxy IR/ROXICODONE) immediate release tablet 5 mg (5 mg Oral Given 08/07/23 1518)  lidocaine (XYLOCAINE) 2 % jelly 1 Application (1 Application Other Given 08/07/23 1800)  sodium phosphate (FLEET) enema 1 enema (1 enema Rectal Given 08/07/23 1800)    ED Course/ Medical Decision Making/ A&P   {                               Medical Decision Making Amount and/or Complexity of Data Reviewed Radiology: ordered.  Risk OTC drugs. Prescription drug management.   77 y.o. male presents to the ER for evaluation of constipation and rectal pain. Differential diagnosis includes but is not limited to SBO, hemorrhoids, impaction. Vital signs unremarkable. Physical exam as noted above.   Patient actually had CT pelvis with contrast performed at outside hospital with urology.  I called radiology to have them read this.  Read is:   1. Tiny right renal stones. Mid/distal ureters and bladder are poorly opacified, limiting evaluation. Otherwise, no additional findings to explain the patient's hematuria. 2. Stool throughout the colon is indicative of constipation, with probable associated fecal impaction. 3. Prostate may be mildly prominent. 4. Aortic atherosclerosis (ICD10-I70.0).   XR preformed here read as  Large volume stool in the rectum. Nonobstructive bowel gas pattern.  Given the CT was done less than 24 hours ago, I do not see any need to reCT him.  This is likely fecal impaction given that there was no small bowel obstruction seen on CT.  I discussed risk benefits of disimpaction with patient and family.  Would like to proceed.  Disimpaction was unsuccessful.  Patient has significant mount of dry, very hard  and stool.  I was able to break up some of the initial large stool burden.  Ordered soapsuds enema.  Bedside commode in the room.  Will reevaluate.  Nursing tried fleet enema with Coude Catheter.  Patient was able to produce several large bowel movements.  He reports he is feeling back to normal and does not have any rectal pain.  He reports he is feeling much better.  He is not having any lower abdominal cramping or rectal cramping.  I did see the bedside commode and there is a large amount of brown hardened stool.  Belly is soft and nontender still.  I discussed with the patient and wife at bedside about admission versus going home.  Discussed that we could admit him for further bowel regiment to keep his bowels moving.  Also discussed the option of going home and continuing daily MiraLAX.  They would like to go home to continue care but extremity will return for any new or worsening symptoms.  The patient overall looks much better.  He appears comfortable, he is sitting on his bottom.  He reports he is not having abdominal pain or any rectal pain any longer.  I do feel like he is stable for discharge home.  They do not appreciate any colitis seen where his fecal impaction was, I do not think any need for any antibiotics for sterile coral colitis.  He is afebrile here.  Recommended to see his MiraLAX in the morning until he produces a bowel movement.  Can keep it 2 scoops or can titrate down to 1 as needed.  We discussed the results of the labs/imaging. The plan is take MiraLAX, follow-up with GI. We discussed strict return precautions and red flag symptoms. The patient verbalized their understanding and agrees to the plan. The patient is stable and being discharged home in good condition.  Portions of this report may have been transcribed using voice recognition software. Every effort was made to ensure accuracy; however, inadvertent computerized transcription errors may be present.   Final Clinical  Impression(s) / ED Diagnoses Final diagnoses:  Fecal impaction [facility])    Rx / DC Orders ED Discharge Orders     None         Achille Rich, PA-C 08/07/23 2009    Floyd, Dan, DO 08/10/23 2256

## 2023-08-07 NOTE — Discharge Instructions (Addendum)
You were seen in the ER today for evaluation of your fecal impaction.  I am glad that you are feeling better.  Narcotics can cause constipation.  For her to be taking narcotics I do recommend taking a daily MiraLAX supplement.  I recommend trying to see his MiraLAX in the morning with coffee.  If he starts having significant loose stools you can titrate down to 1 scoop.  I would recommend following up with your gastroenterologist for further evaluation.  If he still does not produce an additional bowel movement after 24 hours, you can try a Dulcolax medication to help stimulate his bowels to produce a bowel movement.  Make sure you are staying well-hydrated and plenty of fluids, any water.  If you have any concerns, new or worsening symptoms, please return to your nearest emergency department for reevaluation.  Contact a health care provider if you: Have ongoing pain in your rectum. Need to use an enema or a suppository more than 2 times a week. Have rectal bleeding. Continue to have problems. The problems may include not being able to go to the bathroom and having long-term constipation. Have pain in your abdomen. Have thin, pencil-like stools. Get help right away if: You have black or tarry stools.

## 2023-08-07 NOTE — Telephone Encounter (Signed)
Wife states pt is impacted. Discussed with pts wife that if he is impacted she would need to take him to the ER. Suggested she try [facility] or [facility]. She verbalized understanding.

## 2023-08-07 NOTE — ED Triage Notes (Addendum)
Pt c/o constipation/impaction and abd pain. Pt reports that he is unsure when he had his last BM but possibly 5 days ago.

## 2023-08-08 NOTE — Assessment & Plan Note (Signed)
BP is adequately controlled. Continue losartan 100 mg daily and Amlodipine 5 mg daily. Low-salt diet to continue.

## 2023-08-08 NOTE — Assessment & Plan Note (Signed)
Recently added ranolazine 500 mg twice daily. He did not tolerate a Imdur 30 mg. Currently on amlodipine 5 mg daily, Zetia 10 mg daily, and rosuvastatin 40 mg daily. Next appointment with cardiologist on 08/19/2023.

## 2023-08-08 NOTE — Assessment & Plan Note (Addendum)
Continue tramadol 50 mg twice daily as needed, which he takes mainly for cervical and upper back pain. We discussed some side effects of medications, so far he has tolerated well. PDMP reviewed. Will plan on renewing med contract next visit. Follow-up in 5 to 6 months, before if needed.

## 2023-08-11 NOTE — Telephone Encounter (Signed)
PT wife is calling to discuss impaction further. She feels it has not all fully come out and is very concerned. Especially with him having Alzheimer's. Requesting call back.

## 2023-08-11 NOTE — Telephone Encounter (Signed)
PT wife is calling back to very concerned about impaction. She wants to know if she can use Dulcolax or if we can prescribe something for him. Please advise.

## 2023-08-11 NOTE — Telephone Encounter (Signed)
Discussed with pts wife that we recommend 1-3 doses of miralax daily as needed. She reports he has been taking 2 doses daily. She states husband told her he had a BM on Saturday but she is not sure. Discussed she could also try some fleets enemas. States she has an appt with Dr. Rhea Belton but it is out a ways. Pt scheduled to see Mike Gip PA tomorrow at 11:30am. Wife aware of appt.

## 2023-08-12 ENCOUNTER — Ambulatory Visit (INDEPENDENT_AMBULATORY_CARE_PROVIDER_SITE_OTHER)
Admission: RE | Admit: 2023-08-12 | Discharge: 2023-08-12 | Disposition: A | Discharge status: Home / Self Care | Payer: Medicare Other | Source: Ambulatory Visit | Attending: Physician Assistant | Admitting: Physician Assistant

## 2023-08-12 ENCOUNTER — Ambulatory Visit (INDEPENDENT_AMBULATORY_CARE_PROVIDER_SITE_OTHER): Payer: Medicare Other | Admitting: Physician Assistant

## 2023-08-12 ENCOUNTER — Other Ambulatory Visit: Payer: Self-pay | Admitting: Physician Assistant

## 2023-08-12 ENCOUNTER — Encounter: Payer: Self-pay | Admitting: Physician Assistant

## 2023-08-12 VITALS — BP 108/58 | HR 61 | Ht 72.0 in | Wt 152.4 lb

## 2023-08-12 DIAGNOSIS — K5641 Fecal impaction: Secondary | ICD-10-CM

## 2023-08-12 DIAGNOSIS — K5900 Constipation, unspecified: Secondary | ICD-10-CM

## 2023-08-12 NOTE — Patient Instructions (Addendum)
_______________________________________________________  If your blood pressure at your visit was 140/90 or greater, please contact your primary care physician to follow up on this. _______________________________________________________  If you are age 76 or older, your body mass index should be between 23-30. Your Body mass index is 20.67 kg/m. If this is out of the aforementioned range listed, please consider follow up with your Primary Care Provider. ________________________________________________________  The [facility] providers would like to encourage you to use [facility] to communicate with providers for non-urgent requests or questions.  Due to long hold times on the telephone, sending your provider a message by [facility] may be a faster and more efficient way to get a response.  Please allow 48 business hours for a response.  Please remember that this is for non-urgent requests.  _______________________________________________________  Your provider has requested that you have an abdominal x ray before leaving today. Please go to the basement floor to our Radiology department for the test.  START: Miralax 17 grams in 8 ounces of water daily.  If stool is too loose, only use 1/2 dose daily.  We will call you with results of Xray.  Due to recent changes in healthcare laws, you may see the results of your imaging and laboratory studies on MyChart before your provider has had a chance to review them.  We understand that in some cases there may be results that are confusing or concerning to you. Not all laboratory results come back in the same time frame and the provider may be waiting for multiple results in order to interpret others.  Please give [place] 48 hours in order for your provider to thoroughly review all the results before contacting the office for clarification of your results.   Thank you for entrusting me with your care and choosing [facility].  Amy Esterwood, PA-C

## 2023-08-12 NOTE — Progress Notes (Signed)
Subjective:    Patient ID: [name], male    DOB: 1946/04/08, 77 y.o.   MRN: [id number]  HPI [name] is a pleasant 77 year old white male established with Dr. Rhea Lloyd, who comes in today after an ER visit on 08/07/2023 with obstipation and fecal impaction. He has history of Alzheimer's disease with dementia, hypercoagulable state on chronic Coumadin, history of adenomatous colon polyps diverticulosis, osteoarthritis, coronary artery disease and hypertension.  He has not generally had problems with constipation, his wife says the symptoms started little over a month ago.  He had an ER visit at that time with complaints of chest pain and had been started on Imdur and Ranexa.  The Imdur caused headaches and was discontinued but he continues on Ranexa.  He has not had any other changes in his medications. At the time of the ER visit last week he had not had a bowel movement for 5 to 6 days.  Darted complaining of rectal pain which he does not remember.  He was felt to have a fecal impaction.  He did have KUB done confirmed a large volume of stool in the rectum and nonobstructive bowel gas pattern. Wife reports that he was given a soapsuds enema, disimpacted and then a fleets enema in the emergency room, did have to have analgesics due to pain, and had good result with stool at that time.  However since then despite taking MiraLAX twice a day he has not passed much stool since.  He did finally have a large amount of liquid stool yesterday but had not had a bowel movement on Sunday or Monday.  His wife was concerned about persistence or incomplete evacuation of the impaction.  Patient has no complaints of abdominal pain or rectal pain and they have not been seeing any blood.  Last colonoscopy December 2023 with multiple diverticuli throughout the colon, internal hemorrhoids and there were 2 small sessile polyps in the transverse colon measuring 3 to 5 mm.  These were adenomatous and the  recommendation was for 3-year interval follow-up.  That will need to depend on his overall status at that time given Alzheimer's with dementia.  Review of Systems Pertinent positive and negative review of systems were noted in the above HPI section.  All other review of systems was otherwise negative.   Outpatient Encounter Medications as of 08/12/2023  Medication Sig   amLODipine (NORVASC) 5 MG tablet TAKE ONE AND ONE-HALF TABLETS DAILY (Patient taking differently: Take 5 mg by mouth daily.)   cholecalciferol (VITAMIN D) 1000 units tablet Take 1,000 Units by mouth daily.    Cyanocobalamin (VITAMIN B12) 500 MCG TABS Take 500 mcg by mouth daily.    donepezil (ARICEPT) 10 MG tablet TAKE 1 TABLET AT BEDTIME   ezetimibe (ZETIA) 10 MG tablet TAKE 1 TABLET AT BEDTIME   fenofibrate (TRICOR) 145 MG tablet TAKE 1 TABLET AT BEDTIME   finasteride (PROSCAR) 5 MG tablet Take 5 mg by mouth daily.   furosemide (LASIX) 40 MG tablet Take 1 tablet (40 mg total) by mouth every other day.   losartan (COZAAR) 50 MG tablet Take 1 tablet (50 mg total) by mouth daily.   memantine (NAMENDA) 10 MG tablet Take 1 tablet (10 mg total) by mouth 2 (two) times daily.   nitroGLYCERIN (NITROSTAT) 0.4 MG SL tablet Place 1 tablet (0.4 mg total) under the tongue every 5 (five) minutes as needed for chest pain.   ranolazine (RANEXA) 500 MG 12 hr tablet Take 1 tablet (500  mg total) by mouth 2 (two) times daily.   rosuvastatin (CRESTOR) 40 MG tablet TAKE 1 TABLET DAILY   tamsulosin (FLOMAX) 0.4 MG CAPS capsule Take 0.4 mg by mouth daily after supper.   traMADol (ULTRAM) 50 MG tablet Take 1 tablet (50 mg total) by mouth every 12 (twelve) hours as needed. (Patient taking differently: Take 50 mg by mouth See admin instructions. Take 50mg  by mouth 1-2 times a day.)   warfarin (COUMADIN) 5 MG tablet TAKE AS DIRECTED BY COUMADIN CLINIC (Patient taking differently: Take 5 mg by mouth See admin instructions. Take 2.5 mg on Mon,Wed,Fri,Sat,and  Sun. 5 mg on Tue and Thu.)   [DISCONTINUED] isosorbide mononitrate (IMDUR) 30 MG 24 hr tablet Take 1 tablet (30 mg total) by mouth daily.   No facility-administered encounter medications on file as of 08/12/2023.   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) 11/05/2021   Hypertensive retinopathy of both eyes 02/21/2020   Former smoker 03/29/2019   Generalized osteoarthritis of multiple sites 09/20/2018   Unstable gait 09/20/2018   Varicose veins of both lower extremities 06/04/2018   Vitamin D deficiency, unspecified 03/29/2018   Essential hypertension 10/22/2017   Hyperlipidemia 10/22/2017   Urinary hesitancy 02/10/2017   Primary hypercoagulable state (HCC) [D68.59] 02/09/2017   Encounter for therapeutic drug monitoring 02/09/2017   CAD (coronary artery disease) 02/09/2017   Anticoagulated 02/09/2017   Social History   Socioeconomic History   Marital status: Married    Spouse [name]   Number of children: 2   Years of education: COLLEGE   Highest education level: Bachelor's degree (e.g., BA, AB, BS)  Occupational History   Occupation: RETIRED  Tobacco Use   Smoking status: Former    Current packs/day: 0.00    Types: Cigarettes    Quit date: 05/29/1982    Years since quitting: 41.2   Smokeless tobacco: Never  Vaping Use   Vaping status: Never Used  Substance and Sexual Activity   Alcohol use: Yes    Comment: 0-2 drinks a night   Drug use: No   Sexual activity: Not on  file  Other Topics Concern   Not on file  Social History Narrative   Right handed   Caffeine   One story home lives with wife   retired   Chief Executive Officer Determinants of Corporate investment banker Strain: Low Risk  (12/24/2022)   Overall Financial Resource Strain (CARDIA)    Difficulty of Paying Living Expenses: Not hard at all  Food Insecurity: No Food Insecurity (12/24/2022)   Hunger Vital Sign    Worried About Running Out of Food in the Last Year: Never true    Ran Out of Food in the Last Year: Never true  Transportation Needs: No Transportation Needs (12/24/2022)   PRAPARE - Administrator, Civil Service (Medical): No    Lack of Transportation (Non-Medical): No  Physical Activity: Inactive (12/24/2022)   Exercise Vital Sign  Days of Exercise per Week: 0 days    Minutes of Exercise per Session: 0 min  Stress: No Stress Concern Present (12/24/2022)   [employer] of Occupational Health - Occupational Stress Questionnaire    Feeling of Stress : Not at all  Recent Concern: Stress - Stress Concern Present (10/23/2022)   [employer] of Occupational Health - Occupational Stress Questionnaire    Feeling of Stress : To some extent  Social Connections: Moderately Isolated (12/24/2022)   Social Connection and Isolation Panel [NHANES]    Frequency of Communication with Friends and Family: More than three times a week    Frequency of Social Gatherings with Friends and Family: More than three times a week    Attends Religious Services: Never    Database administrator or Organizations: No    Attends Banker Meetings: Never    Marital Status: Married  Catering manager Violence: Not At Risk (12/24/2022)   Humiliation, Afraid, Rape, and Kick questionnaire    Fear of Current or Ex-Partner: No    Emotionally Abused: No    Physically Abused: No    Sexually Abused: No    Mr. Mash's family history includes AAA (abdominal aortic aneurysm) in his sister;  Alzheimer's disease in his mother; Cancer in his father; Hypertension in his maternal grandmother and paternal grandmother; Prostate cancer in his father.      Objective:    Vitals:   08/12/23 1134  BP: (!) 108/58  Pulse: 61  SpO2: 97%    Physical Exam. Well-developed very kyphotic elderly white male in no acute distress.  Accompanied by his wife who gives most of the history Weight, 152 BMI 20.6  HEENT; nontraumatic normocephalic, EOMI, PE R LA, sclera anicteric. Oropharynx; not examined today Neck; supple, no JVD Cardiovascular; regular rate and rhythm with S1-S2, no murmur rub or gallop Pulmonary; Clear bilaterally Abdomen; soft, nontender, nondistended, no palpable mass or hepatosplenomegaly, bowel sounds are present, wearing depends Rectal; no external anal rectal lesion, on digital exam there is no fecal impaction, small amount of liquid stool in the rectal vault, brown nontender to exam Skin; benign exam, no jaundice rash or appreciable lesions Extremities; no clubbing cyanosis or edema skin warm and dry Neuro/Psych; alert and oriented x4, grossly nonfocal mood and affect appropriate        Assessment & Plan:   #80 77 year old white male with Alzheimer's dementia, with recent ER visit on 08/07/2023 with obstipation and complaints of rectal pain.  KUB confirmed a large amount of stool in the rectum consistent with impaction.  He did have enemas and disimpaction done in the emergency room He was started on twice daily MiraLAX, and over the past 5 to 6 days his wife has become concerned because he was not having bowel movements on a daily basis.  He did have a large amount of liquid stool yesterday.  On exam today there is no residual fecal impaction, small amount of liquid stool in the rectal vault nontender to exam and abdomen is benign.  Reassured that the impaction has resolved  I think he developed constipation secondary to Ranexa which is a new medication over the past  month or so.  #2 history of adenomatous colon polyps-up-to-date with colonoscopy last done December 2023-indicated for 3-year interval follow-up, that will need to be based on his fitness at that time given advanced age and Alzheimer's.  #3 coronary artery disease #4.  Hypertension #5.  Osteoarthritis #6.  Hypercoagulable state on chronic Coumadin #  7 new problem of intermittent bilateral spasms in his lower extremities-upcoming appointment [facility]; KUB today-assess for degree of persistent obstipation Continue Ranexa as this has been helping his chest pain. Discussed titrating MiraLAX with his wife.  At this point would continue MiraLAX 17 g in 8 ounces of water every day, is this results in diarrhea or more than 1-2 bowel movements per day can decrease to half a dose daily and she can titrate up as needed.   Davene Jobin Oswald Hillock PA-C 08/12/2023   Cc: [place], Betty G, MD

## 2023-08-17 DIAGNOSIS — R310 Gross hematuria: Secondary | ICD-10-CM | POA: Diagnosis not present

## 2023-08-18 ENCOUNTER — Telehealth: Payer: Self-pay | Admitting: *Deleted

## 2023-08-18 NOTE — Telephone Encounter (Signed)
Wife called to report pt took one dose of cipro 500mg  yesterday. Advised that the med dose interact with warfarin and since it was one dose to have something green leafy today or tomorrow and keep normal dose of warfarin. Also, advised to call if he gets anymore and she verbalized understanding.

## 2023-08-19 ENCOUNTER — Encounter: Payer: Self-pay | Admitting: Physician Assistant

## 2023-08-19 ENCOUNTER — Ambulatory Visit: Payer: Medicare Other | Attending: Physician Assistant | Admitting: Physician Assistant

## 2023-08-19 VITALS — BP 100/64 | HR 87 | Ht 73.0 in | Wt 158.0 lb

## 2023-08-19 DIAGNOSIS — I10 Essential (primary) hypertension: Secondary | ICD-10-CM | POA: Diagnosis not present

## 2023-08-19 DIAGNOSIS — E782 Mixed hyperlipidemia: Secondary | ICD-10-CM | POA: Diagnosis present

## 2023-08-19 DIAGNOSIS — Z951 Presence of aortocoronary bypass graft: Secondary | ICD-10-CM | POA: Diagnosis present

## 2023-08-19 DIAGNOSIS — R079 Chest pain, unspecified: Secondary | ICD-10-CM | POA: Diagnosis not present

## 2023-08-19 NOTE — Patient Instructions (Signed)
Medication Instructions:  Stop Imdur *If you need a refill on your cardiac medications before your next appointment, please call your pharmacy*   Lab Work: No Labs If you have labs (blood work) drawn today and your tests are completely normal, you will receive your results only by: MyChart Message (if you have MyChart) OR A paper copy in the mail If you have any lab test that is abnormal or we need to change your treatment, we will call you to review the results.   Testing/Procedures: No Testing   Follow-Up: At [facility], you and your health needs are our priority.  As part of our continuing mission to provide you with exceptional heart care, we have created designated Provider Care Teams.  These Care Teams include your primary Cardiologist (physician) and Advanced Practice Providers (APPs -  Physician Assistants and Nurse Practitioners) who all work together to provide you with the care you need, when you need it.  We recommend signing up for the patient portal called "MyChart".  Sign up information is provided on this After Visit Summary.  MyChart is used to connect with patients for Virtual Visits (Telemedicine).  Patients are able to view lab/test results, encounter notes, upcoming appointments, etc.  Non-urgent messages can be sent to your provider as well.   To learn more about what you can do with MyChart, go to [email or web address].    Your next appointment:   6 month(s)  Provider:   Micah Flesher, PA-C

## 2023-08-20 ENCOUNTER — Telehealth: Payer: Self-pay

## 2023-08-20 NOTE — Telephone Encounter (Signed)
Transition Care Management Follow-up Telephone Call Date of discharge and from where: 08/07/2023 [facility] How have you been since you were released from the hospital? Patient's spouse stated he is feeling much better. Any questions or concerns? No  Items Reviewed: Did the pt receive and understand the discharge instructions provided? Yes  Medications obtained and verified?  No medication prescribed. Other? No  Any new allergies since your discharge? No  Dietary orders reviewed? Yes Do you have support at home? Yes   Follow up appointments reviewed:  PCP Hospital f/u appt confirmed? No  Scheduled to see  on  @ . Specialist Hospital f/u appt confirmed? Yes  Scheduled to see Sammuel Cooper, PA-C on 08/12/2023 @ [facility]. Are transportation arrangements needed? No  If their condition worsens, is the pt aware to call PCP or go to the Emergency Dept.? Yes Was the patient provided with contact information for the PCP's office or ED? Yes Was to pt encouraged to call back with questions or concerns? Yes  Lunette Tapp [facility]  [facility] Guide Direct Dial: [phone]  Website: [email or web address]

## 2023-08-24 NOTE — Progress Notes (Signed)
Addendum: Reviewed and agree with assessment and management plan. Kadijah Shamoon M, MD  

## 2023-08-26 ENCOUNTER — Other Ambulatory Visit: Payer: Self-pay | Admitting: Physician Assistant

## 2023-08-26 DIAGNOSIS — R259 Unspecified abnormal involuntary movements: Secondary | ICD-10-CM | POA: Diagnosis not present

## 2023-08-26 DIAGNOSIS — G309 Alzheimer's disease, unspecified: Secondary | ICD-10-CM

## 2023-08-30 ENCOUNTER — Encounter: Payer: Self-pay | Admitting: Physician Assistant

## 2023-08-30 ENCOUNTER — Other Ambulatory Visit: Payer: Self-pay | Admitting: Physician Assistant

## 2023-08-31 ENCOUNTER — Ambulatory Visit: Payer: Medicare Other | Attending: Cardiology

## 2023-08-31 ENCOUNTER — Other Ambulatory Visit: Payer: Self-pay | Admitting: Physician Assistant

## 2023-08-31 DIAGNOSIS — D6859 Other primary thrombophilia: Secondary | ICD-10-CM

## 2023-08-31 DIAGNOSIS — Z5181 Encounter for therapeutic drug level monitoring: Secondary | ICD-10-CM

## 2023-08-31 LAB — POCT INR: INR: 2 (ref 2.0–3.0)

## 2023-08-31 MED ORDER — MEMANTINE HCL 10 MG PO TABS: 10.0000 mg | ORAL_TABLET | Freq: Two times a day (BID) | ORAL | 0 refills | Status: DC

## 2023-08-31 NOTE — Patient Instructions (Signed)
continue taking 1/2 tablet daily EXCEPT 1 tablet on Tuesdays and Thursdays.  Stay consistent with greens each week Recheck INR in 4 weeks.  Coumadin Clinic [phone]

## 2023-09-01 ENCOUNTER — Telehealth: Payer: Self-pay | Admitting: Physician Assistant

## 2023-09-01 NOTE — Telephone Encounter (Signed)
I advised to hold Aricept per Huntley Dec, to call with update in a few weeks. She thanked me for calling.

## 2023-09-01 NOTE — Telephone Encounter (Signed)
Pt's wife called in concerned. She stated the pt has had an increase in hallucinations. He was evaluated for his leg spasms. There is no treatment at this time. Notes should be available on mychart under Aletta Edouard. She would like to speak with someone.

## 2023-09-03 ENCOUNTER — Encounter: Payer: Self-pay | Admitting: Internal Medicine

## 2023-09-03 ENCOUNTER — Ambulatory Visit: Payer: Medicare Other | Admitting: Internal Medicine

## 2023-09-03 VITALS — BP 98/68 | HR 72 | Wt 155.0 lb

## 2023-09-03 DIAGNOSIS — K5909 Other constipation: Secondary | ICD-10-CM

## 2023-09-03 DIAGNOSIS — K5903 Drug induced constipation: Secondary | ICD-10-CM | POA: Diagnosis not present

## 2023-09-03 DIAGNOSIS — Z86010 Personal history of colonic polyps: Secondary | ICD-10-CM | POA: Diagnosis not present

## 2023-09-03 DIAGNOSIS — T402X5A Adverse effect of other opioids, initial encounter: Secondary | ICD-10-CM

## 2023-09-03 MED ORDER — NALOXEGOL OXALATE 25 MG PO TABS: 25.0000 mg | ORAL_TABLET | Freq: Every day | ORAL | 1 refills | Status: DC

## 2023-09-03 NOTE — Patient Instructions (Signed)
We have sent the following medications to your pharmacy for you to pick up at your convenience: Movantik 25 mg daily. Take Movantik on the days you take Tramadol.   Take over the counter Miralax mixing 17 grams in 8 oz of water/juice 1-2 x daily. The goal is to have a bowel movement every other day at least.   _______________________________________________________  If your blood pressure at your visit was 140/90 or greater, please contact your primary care physician to follow up on this.  _______________________________________________________  If you are age 24 or older, your body mass index should be between 23-30. Your Body mass index is 20.45 kg/m. If this is out of the aforementioned range listed, please consider follow up with your Primary Care Provider.  If you are age 27 or younger, your body mass index should be between 19-25. Your Body mass index is 20.45 kg/m. If this is out of the aformentioned range listed, please consider follow up with your Primary Care Provider.   ________________________________________________________  The [facility] providers would like to encourage you to use [facility] to communicate with providers for non-urgent requests or questions.  Due to long hold times on the telephone, sending your provider a message by [facility] may be a faster and more efficient way to get a response.  Please allow 48 business hours for a response.  Please remember that this is for non-urgent requests.  _______________________________________________________

## 2023-09-03 NOTE — Progress Notes (Signed)
Subjective:    Patient ID: [name], male    DOB: [birth year], 77 y.o.   MRN: [id number]  HPI [name] is a 77 year old male with a history of constipation and fecal impaction in late August 2024, history of colonic polyps, colonic diverticulosis, hemorrhoids, Alzheimer's disease, CAD, hypertension, hypercoagulable state on warfarin who is here for follow-up.  He is here today with his wife and was last seen on 08/12/2023 by Mike Gip, PA-C.  He is doing well with his bowel habits of late.  He is using MiraLAX 17 g daily.  He is having a bowel movement typically every other day but can go as long as 3 days between bowel movement.  No further hard stool or difficulty with stool evacuation.  No rectal bleeding.  No abdominal pain.  He has had overall increase in his general arthritic type pain because he has remained off of tramadol since his fecal impaction.  He is also remained off Imdur but continued on Ranexa.  He and his wife feel that he would have a better quality of life if he was able to use tramadol again but they are worried that this may cause recurrent constipation with fear of fecal impaction.  His last colonoscopy was performed by me in December 2023.  See that note for details   Review of Systems As per HPI, otherwise negative  Current Medications, Allergies, Past Medical History, Past Surgical History, Family History and Social History were reviewed in [employer] record.    Objective:   Physical Exam BP 98/68   Pulse 72   Wt 155 lb (70.3 kg)   BMI 20.45 kg/m  Gen: awake, alert, NAD HEENT: anicteric  Ext: no c/c/e      Assessment & Plan:  77 year old male with a history of constipation and fecal impaction in late August 2024, history of colonic polyps, colonic diverticulosis, hemorrhoids, Alzheimer's disease, CAD, hypertension, hypercoagulable state on warfarin who is here for follow-up.   Constipation; chronic and opioid induced  --I am most suspicious that tramadol was the main contributor to his constipation and fecal impaction.  He is doing very well with MiraLAX and I discussed the option of adding Movantik if he plans to resume tramadol.  It seems that resuming tramadol would give him a better quality of life.  After thorough discussion I have recommended the following -- MiraLAX 17 g at least daily possibly twice daily targeting a bowel movement at least every other day -- Begin Movantik 25 mg daily best taken on an empty stomach; this would only be used if he is taking tramadol; he was given samples today and prescription provided.  They were reminded to let us know if he has any side effects or concerns after trying this medication.  2. History of colonic polyps --up-to-date with colonoscopy.  Surveillance colonoscopy would be considered around December 2026 based on his overall health and appropriateness for that procedure at that time  3.  Chronic pain --tramadol prescription per primary care, see #1  Can follow-up as needed  30 minutes total spent today including patient facing time, coordination of care, reviewing medical history/procedures/pertinent radiology studies, and documentation of the encounter.

## 2023-09-05 ENCOUNTER — Ambulatory Visit (HOSPITAL_COMMUNITY)
Admission: EM | Admit: 2023-09-05 | Discharge: 2023-09-05 | Disposition: A | Discharge status: Home / Self Care | Payer: Medicare Other | Attending: Emergency Medicine | Admitting: Emergency Medicine

## 2023-09-05 ENCOUNTER — Encounter (HOSPITAL_COMMUNITY): Payer: Self-pay | Admitting: Emergency Medicine

## 2023-09-05 DIAGNOSIS — L03115 Cellulitis of right lower limb: Secondary | ICD-10-CM

## 2023-09-05 MED ORDER — CLINDAMYCIN HCL 150 MG PO CAPS: 300.0000 mg | ORAL_CAPSULE | Freq: Three times a day (TID) | ORAL | 0 refills | Status: DC

## 2023-09-05 MED ORDER — CLINDAMYCIN HCL 150 MG PO CAPS
300.0000 mg | ORAL_CAPSULE | Freq: Three times a day (TID) | ORAL | 0 refills | Status: DC
Start: 2023-09-05 — End: 2023-09-05

## 2023-09-05 NOTE — ED Provider Notes (Signed)
[facility]    CSN: [id number] Arrival date & time: 09/05/23  1222      History   Chief Complaint Chief Complaint  Patient presents with   Skin Ulcer    HPI [name] is a 77 y.o. male.   Patient presents with wife for wound to right lower leg x 10 days.  Wife states that she has been trying to get patient into wound care without any success.  Wife states that wound started small and has become bigger and now has pus and swelling.  Denies fever, confusion, lethargy, and dizziness.  Wife reports applying silver alginate and keeping wound covered.     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/22/2023   Sinus bradycardia 12/15/2022   Acute urinary retention 12/15/2022   History of adenomatous polyp of colon 11/20/2022   Benign neoplasm of transverse colon 11/20/2022   Bilateral lower extremity edema 10/24/2022   Venous ulcer of left leg (HCC) 10/24/2022   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) 11/05/2021   Hypertensive retinopathy of both eyes 02/21/2020   Former smoker 03/29/2019   Generalized osteoarthritis of multiple sites 09/20/2018   Unstable gait 09/20/2018   Varicose veins of both lower extremities 06/04/2018   Vitamin D deficiency, unspecified 03/29/2018   Essential hypertension 10/22/2017   Hyperlipidemia 10/22/2017   Urinary hesitancy 02/10/2017   Primary hypercoagulable state (HCC) [D68.59] 02/09/2017   Encounter for therapeutic drug monitoring 02/09/2017   CAD (coronary artery disease) 02/09/2017   Anticoagulated 02/09/2017    Past Surgical History:  Procedure Laterality Date   BYPASS GRAFT  1996    CATARACT EXTRACTION Right    COLONOSCOPY WITH PROPOFOL N/A 11/20/2022   Procedure: COLONOSCOPY WITH PROPOFOL;  Surgeon: Beverley Fiedler, MD;  Location: [facility] ENDOSCOPY;  Service: Gastroenterology;  Laterality: N/A;   INGUINAL HERNIA REPAIR Right 04/09/2020   Procedure: LAPAROSCOPIC RIGHT INGUINAL HERNIA REPAIR WITH MESH;  Surgeon: Axel Filler, MD;  Location: [facility] OR;  Service: General;  Laterality: Right;   POLYPECTOMY  11/20/2022   Procedure: POLYPECTOMY;  Surgeon: Beverley Fiedler, MD;  Location: [facility] ENDOSCOPY;  Service: Gastroenterology;;   REFRACTIVE SURGERY Right    SKIN BIOPSY     head x 2   UMBILICAL HERNIA REPAIR  2003       Home Medications    Prior to Admission medications   Medication Sig Start Date End Date Taking? Authorizing Provider  clindamycin (CLEOCIN) 150 MG capsule Take 2 capsules (300 mg total) by mouth in the morning, at noon, and at bedtime for 7 days. 09/05/23 09/12/23 Yes Bransen Fassnacht A, NP  amLODipine (NORVASC) 5 MG tablet TAKE ONE AND ONE-HALF TABLETS DAILY Patient taking differently: Take 5 mg by mouth daily. 01/21/23   Corky Crafts, MD  cholecalciferol (VITAMIN D) 1000 units tablet Take 1,000 Units by mouth daily.     [provider]  Cyanocobalamin (VITAMIN B12) 500 MCG TABS Take 500 mcg by mouth daily.     [provider]  donepezil (ARICEPT) 10 MG tablet TAKE 1 TABLET AT BEDTIME 08/26/23  Marcos Eke, PA-C  ezetimibe (ZETIA) 10 MG tablet TAKE 1 TABLET AT BEDTIME 05/18/23   Corky Crafts, MD  fenofibrate (TRICOR) 145 MG tablet TAKE 1 TABLET AT BEDTIME 03/31/23   Corky Crafts, MD  finasteride (PROSCAR) 5 MG tablet Take 5 mg by mouth daily. 06/12/23   [provider]  furosemide (LASIX) 40 MG tablet Take 1 tablet (40 mg total) by mouth every other day. 07/16/23 10/14/23  Marcelino Duster, PA  losartan (COZAAR) 50 MG tablet Take 1 tablet (50 mg total) by mouth daily. 07/16/23 10/14/23  Marcelino Duster, PA   memantine (NAMENDA) 10 MG tablet Take 1 tablet (10 mg total) by mouth 2 (two) times daily. 02/18/23   Marcos Eke, PA-C  memantine (NAMENDA) 10 MG tablet Take 1 tablet (10 mg total) by mouth 2 (two) times daily. 08/31/23   Marcos Eke, PA-C  naloxegol oxalate (MOVANTIK) 25 MG TABS tablet Take 1 tablet (25 mg total) by mouth daily. 09/03/23   Pyrtle, Carie Caddy, MD  nitroGLYCERIN (NITROSTAT) 0.4 MG SL tablet Place 1 tablet (0.4 mg total) under the tongue every 5 (five) minutes as needed for chest pain. 01/24/22   Corky Crafts, MD  ranolazine (RANEXA) 500 MG 12 hr tablet Take 1 tablet (500 mg total) by mouth 2 (two) times daily. 07/16/23   Marcelino Duster, PA  rosuvastatin (CRESTOR) 40 MG tablet TAKE 1 TABLET DAILY 05/18/23   Corky Crafts, MD  tamsulosin (FLOMAX) 0.4 MG CAPS capsule Take 0.4 mg by mouth daily after supper.    [provider]  traMADol (ULTRAM) 50 MG tablet Take 1 tablet (50 mg total) by mouth every 12 (twelve) hours as needed. Patient taking differently: Take 50 mg by mouth See admin instructions. Take 50mg  by mouth 1-2 times a day. 06/23/23   [place], Betty G, MD  warfarin (COUMADIN) 5 MG tablet TAKE AS DIRECTED BY COUMADIN CLINIC Patient taking differently: Take 5 mg by mouth See admin instructions. Take 2.5 mg on Mon,Wed,Fri,Sat,and Sun. 5 mg on Tue and Thu. 08/08/22   Corky Crafts, MD    Family History Family History  Problem Relation Age of Onset   Alzheimer's disease Mother    Cancer Father        prostate   Prostate cancer Father    AAA (abdominal aortic aneurysm) Sister    Hypertension Maternal Grandmother    Hypertension Paternal Grandmother    Colon cancer Other        great uncle   Esophageal cancer Neg Hx    Pancreatic cancer Neg Hx    Liver disease Neg Hx    Stomach cancer Neg Hx     Social History Social History   Tobacco Use   Smoking status: Former    Current packs/day: 0.00    Types: Cigarettes    Quit date:  05/29/1982    Years since quitting: 41.2   Smokeless tobacco: Never  Vaping Use   Vaping status: Never Used  Substance Use Topics   Alcohol use: Yes    Comment: 0-2 drinks a night   Drug use: No     Allergies   Other, Imdur [isosorbide nitrate], and Latex   Review of Systems Review of Systems  Constitutional:  Negative for fatigue and fever.  Skin:  Positive for color change and wound.  Neurological:  Negative for dizziness and weakness.  Psychiatric/Behavioral:  Negative for confusion.      Physical Exam Triage  Vital Signs ED Triage Vitals  Encounter Vitals Group     BP 09/05/23 1242 105/64     Systolic BP Percentile --      Diastolic BP Percentile --      Pulse Rate 09/05/23 1242 63     Resp 09/05/23 1242 16     Temp 09/05/23 1242 98.1 F (36.7 C)     Temp Source 09/05/23 1242 Oral     SpO2 09/05/23 1242 92 %     Weight --      Height --      Head Circumference --      Peak Flow --      Pain Score 09/05/23 1239 8     Pain Loc --      Pain Education --      Exclude from Growth Chart --    No data found.  Updated Vital Signs BP 105/64 (BP Location: Right Arm)   Pulse 63   Temp 98.1 F (36.7 C) (Oral)   Resp 16   SpO2 92%   Visual Acuity Right Eye Distance:   Left Eye Distance:   Bilateral Distance:    Right Eye Near:   Left Eye Near:    Bilateral Near:     Physical Exam Vitals and nursing note reviewed.  Constitutional:      General: He is awake. He is not in acute distress.    Appearance: Normal appearance. He is well-developed and well-groomed. He is not ill-appearing, toxic-appearing or diaphoretic.  Cardiovascular:     Pulses:          Dorsalis pedis pulses are 1+ on the right side.       Posterior tibial pulses are 1+ on the right side.  Feet:     Right foot:     Skin integrity: Skin integrity normal.  Skin:    General: Skin is warm and moist.     Findings: Erythema and wound present. No abscess.     Comments: Baseball-sized  erythematous wound to medial aspect of R lower leg with large blister and mild swelling around wound. Yellow pus draining from wound.   Neurological:     Mental Status: He is alert.  Psychiatric:        Behavior: Behavior is cooperative.      UC Treatments / Results  Labs (all labs ordered are listed, but only abnormal results are displayed) Labs Reviewed - No data to display  EKG   Radiology No results found.  Procedures Procedures (including critical care time)  Medications Ordered in UC Medications - No data to display  Initial Impression / Assessment and Plan / UC Course  I have reviewed the triage vital signs and the nursing notes.  Pertinent labs & imaging results that were available during my care of the patient were reviewed by me and considered in my medical decision making (see chart for details).     Patient presented with wife for 10-day history of right lower leg wound.  Wife reports wound started small and has been becoming larger and has passed and mild swelling.  Denies fever, confusion, lethargy, and dizziness. Hx of CAD.  Upon assessment patient has baseball sized.  Erythematous wound to medial aspect of right lower leg with large blister and mild swelling around wound.  Yellow pus draining from wound.  Surrounding tissue is not hot to touch and there is no significant swelling or redness along the rest of the leg. Pedal pulses present.  Prescribed clindamycin for cellulitis.  Given referral wound care.  Discussed return, follow-up, and strict emergency department precautions. Final Clinical Impressions(s) / UC Diagnoses   Final diagnoses:  Cellulitis of right lower extremity     Discharge Instructions      Start taking Clindamycin 3 times daily for 7 days. Keep wound clean and dressed. DO NOT POP BLISTER, this could increase risk for worsening infection. I have put in a consult to wound care and someone should call to set up an  appointment. If they  do not reach out over the next few days please call them schedule an appointment. If you develop spreading redness, streaking redness, increased swelling, fever, confusion, or lethargy please seek immediate medical treatment in the ER. Return here as needed.    ED Prescriptions     Medication Sig Dispense Auth. Provider   clindamycin (CLEOCIN) 150 MG capsule Take 2 capsules (300 mg total) by mouth in the morning, at noon, and at bedtime for 7 days. 42 capsule Wynonia Lawman A, NP      PDMP not reviewed this encounter.   Wynonia Lawman A, NP 09/05/23 1455

## 2023-09-05 NOTE — ED Triage Notes (Signed)
Pt has right leg ankle wound that caregiver notice was infected this am with dressing change.  She has been putting silver alginate and tegaderm on wound

## 2023-09-05 NOTE — Discharge Instructions (Signed)
Start taking Clindamycin 3 times daily for 7 days. Keep wound clean and dressed. DO NOT POP BLISTER, this could increase risk for worsening infection. I have put in a consult to wound care and someone should call to set up an  appointment. If they do not reach out over the next few days please call them schedule an appointment. If you develop spreading redness, streaking redness, increased swelling, fever, confusion, or lethargy please seek immediate medical treatment in the ER. Return here as needed.

## 2023-09-07 ENCOUNTER — Ambulatory Visit (INDEPENDENT_AMBULATORY_CARE_PROVIDER_SITE_OTHER): Payer: Medicare Other | Admitting: Family Medicine

## 2023-09-07 ENCOUNTER — Encounter: Payer: Self-pay | Admitting: Family Medicine

## 2023-09-07 ENCOUNTER — Telehealth: Payer: Self-pay | Admitting: *Deleted

## 2023-09-07 VITALS — BP 128/70 | HR 65 | Temp 98.6°F

## 2023-09-07 DIAGNOSIS — L03115 Cellulitis of right lower limb: Secondary | ICD-10-CM

## 2023-09-07 MED ORDER — CLINDAMYCIN HCL 300 MG PO CAPS: 300.0000 mg | ORAL_CAPSULE | Freq: Four times a day (QID) | ORAL | 0 refills | Status: DC

## 2023-09-07 NOTE — Telephone Encounter (Signed)
Wife called to report pt is taking clindamycin and advised this medication is safe to take with warfarin. She verbalized understanding and advised her to call back with any new meds.   Clindamycin 150mg  TID x 7 days started 09/05/23

## 2023-09-07 NOTE — Progress Notes (Signed)
   Subjective:    Patient ID: [name], male    DOB: [birth year], 77 y.o.   MRN: [id number]  HPI Here to follow up an urgent care visit on 09-05-23 for a spontaneous cellulitis on the right lower leg. He has lymphedema, and this has happened before no recent trauma. No fever. A small red spot appeared on the leg and then quickly this grew into a large blister that drained clear fluid. At the urgent care he was started on Clindamycin 300 mg TID, and the area seems to have stabilized. His wife has been dressing it with gauze.     Review of Systems  Constitutional: Negative.   Respiratory: Negative.    Cardiovascular: Negative.   Skin:  Positive for wound.       Objective:   Physical Exam Constitutional:      Comments: In a wheelchair   Cardiovascular:     Rate and Rhythm: Normal rate and regular rhythm.     Pulses: Normal pulses.     Heart sounds: Normal heart sounds.  Pulmonary:     Effort: Pulmonary effort is normal.     Breath sounds: Normal breath sounds.  Musculoskeletal:     Comments: Both lower extremities has 3+ edema. The medial right lower leg has a 5 cm area of macular erythema with a large blister in the center. This is mildly tender but not warm. No streaking up the leg   Neurological:     Mental Status: He is alert.           Assessment & Plan:  Cellulitis. We will increase the Clindamycin to 300 mg QID. He is scheduled to see the [facility] on 09-17-23.  Gershon Crane, MD

## 2023-09-15 ENCOUNTER — Emergency Department (HOSPITAL_COMMUNITY)
Admission: EM | Admit: 2023-09-15 | Discharge: 2023-09-15 | Disposition: A | Discharge status: Home / Self Care | Payer: Medicare Other | Attending: Emergency Medicine | Admitting: Emergency Medicine

## 2023-09-15 ENCOUNTER — Emergency Department (HOSPITAL_COMMUNITY): Payer: Medicare Other

## 2023-09-15 ENCOUNTER — Encounter (HOSPITAL_COMMUNITY): Payer: Self-pay | Admitting: Emergency Medicine

## 2023-09-15 ENCOUNTER — Other Ambulatory Visit: Payer: Self-pay

## 2023-09-15 DIAGNOSIS — R079 Chest pain, unspecified: Secondary | ICD-10-CM | POA: Diagnosis not present

## 2023-09-15 DIAGNOSIS — R000 Tachycardia, unspecified: Secondary | ICD-10-CM | POA: Diagnosis not present

## 2023-09-15 DIAGNOSIS — R0989 Other specified symptoms and signs involving the circulatory and respiratory systems: Secondary | ICD-10-CM | POA: Diagnosis not present

## 2023-09-15 DIAGNOSIS — Z91040 Latex allergy status: Secondary | ICD-10-CM | POA: Insufficient documentation

## 2023-09-15 DIAGNOSIS — I700 Atherosclerosis of aorta: Secondary | ICD-10-CM | POA: Diagnosis not present

## 2023-09-15 DIAGNOSIS — R0789 Other chest pain: Secondary | ICD-10-CM | POA: Diagnosis not present

## 2023-09-15 DIAGNOSIS — I771 Stricture of artery: Secondary | ICD-10-CM | POA: Diagnosis not present

## 2023-09-15 DIAGNOSIS — R001 Bradycardia, unspecified: Secondary | ICD-10-CM | POA: Diagnosis not present

## 2023-09-15 LAB — BASIC METABOLIC PANEL
Anion gap: 14 (ref 5–15)
BUN: 20 mg/dL (ref 8–23)
CO2: 27 mmol/L (ref 22–32)
Calcium: 10.2 mg/dL (ref 8.9–10.3)
Chloride: 105 mmol/L (ref 98–111)
Creatinine, Ser: 1.15 mg/dL (ref 0.61–1.24)
GFR, Estimated: >60 mL/min (ref >60)
Glucose, Bld: 100 mg/dL — ABNORMAL HIGH (ref 70–99)
Potassium: 3.5 mmol/L (ref 3.5–5.1)
Sodium: 146 mmol/L — ABNORMAL HIGH (ref 135–145)

## 2023-09-15 LAB — HEPATIC FUNCTION PANEL
ALT: 14 U/L (ref 0–44)
AST: 19 U/L (ref 15–41)
Albumin: 2.6 g/dL — ABNORMAL LOW (ref 3.5–5.0)
Alkaline Phosphatase: 23 U/L — ABNORMAL LOW (ref 38–126)
Bilirubin, Direct: 0.2 mg/dL (ref 0.0–0.2)
Indirect Bilirubin: 0.4 mg/dL (ref 0.3–0.9)
Total Bilirubin: 0.6 mg/dL (ref 0.3–1.2)
Total Protein: 4.1 g/dL — ABNORMAL LOW (ref 6.5–8.1)

## 2023-09-15 LAB — CBC
HCT: 32.4 % — ABNORMAL LOW (ref 39.0–52.0)
Hemoglobin: 10.7 g/dL — ABNORMAL LOW (ref 13.0–17.0)
MCH: 31.2 pg (ref 26.0–34.0)
MCHC: 33 g/dL (ref 30.0–36.0)
MCV: 94.5 fL (ref 80.0–100.0)
Platelets: 227 10*3/uL (ref 150–400)
RBC: 3.43 MIL/uL — ABNORMAL LOW (ref 4.22–5.81)
RDW: 14.7 % (ref 11.5–15.5)
WBC: 2.5 10*3/uL — ABNORMAL LOW (ref 4.0–10.5)
nRBC: 0 % (ref 0.0–0.2)

## 2023-09-15 LAB — PROTIME-INR
INR: 2.9 — ABNORMAL HIGH (ref 0.8–1.2)
Prothrombin Time: 30.6 s — ABNORMAL HIGH (ref 11.4–15.2)

## 2023-09-15 LAB — TROPONIN I (HIGH SENSITIVITY)
Troponin I (High Sensitivity): 6 ng/L (ref <18)
Troponin I (High Sensitivity): 5 ng/L (ref <18)

## 2023-09-15 LAB — LIPASE, BLOOD: Lipase: 26 U/L (ref 11–51)

## 2023-09-15 NOTE — ED Triage Notes (Signed)
Per [facility] pt coming from home c/o chest pain onset of 9:30 am this morning. Wife gave pt 4 nitroglycerin tablets and 50 mg tramadol. Wife did not want patient to receive aspirin. Hx of dementia.

## 2023-09-15 NOTE — ED Provider Notes (Signed)
[place] EM[facility] DEPART[facility] AT [facility] Provider Note   CSN: [id number] Arrival date & time: 09/15/23  1135     History  Chief Complaint  Patient presents with   Chest Pain    AKASHDEEP CHUBA is a 77 y.o. male.  77 year old male presents from home with complaining of chest pain x 2 hours.  Patient pain was located subxiphoid and did go to his back.  It was unrelieved with 4 nitroglycerin.  Was not associated with dyspnea or diaphoresis.  No nausea or vomiting.  No exertional pain to it.  History of similar symptoms in the past with unclear etiology.  Denies any fever cough or shortness of breath currently.  Patient also received tramadol at home.  Denies any GI symptoms.  Called EMS and was transported here       Home Medications Prior to Admission medications   Medication Sig Start Date End Date Taking? Authorizing Provider  amLODipine (NORVASC) 5 MG tablet TAKE ONE AND ONE-HALF TABLETS DAILY Patient taking differently: Take 5 mg by mouth daily. 01/21/23   Corky Crafts, MD  cholecalciferol (VITAMIN D) 1000 units tablet Take 1,000 Units by mouth daily.     [provider]  clindamycin (CLEOCIN) 300 MG capsule Take 1 capsule (300 mg total) by mouth 4 (four) times daily. 09/07/23   Nelwyn Salisbury, MD  Cyanocobalamin (VITAMIN B12) 500 MCG TABS Take 500 mcg by mouth daily.     [provider]  donepezil (ARICEPT) 10 MG tablet TAKE 1 TABLET AT BEDTIME 08/26/23   Marcos Eke, PA-C  ezetimibe (ZETIA) 10 MG tablet TAKE 1 TABLET AT BEDTIME 05/18/23   Corky Crafts, MD  fenofibrate (TRICOR) 145 MG tablet TAKE 1 TABLET AT BEDTIME 03/31/23   Corky Crafts, MD  finasteride (PROSCAR) 5 MG tablet Take 5 mg by mouth daily. 06/12/23   [provider]  furosemide (LASIX) 40 MG tablet Take 1 tablet (40 mg total) by mouth every other day. 07/16/23 10/14/23  Marcelino Duster, PA  losartan (COZAAR) 50 MG tablet Take 1 tablet (50 mg total)  by mouth daily. 07/16/23 10/14/23  Marcelino Duster, PA  memantine (NAMENDA) 10 MG tablet Take 1 tablet (10 mg total) by mouth 2 (two) times daily. 02/18/23   Marcos Eke, PA-C  memantine (NAMENDA) 10 MG tablet Take 1 tablet (10 mg total) by mouth 2 (two) times daily. 08/31/23   Marcos Eke, PA-C  naloxegol oxalate (MOVANTIK) 25 MG TABS tablet Take 1 tablet (25 mg total) by mouth daily. 09/03/23   Pyrtle, Carie Caddy, MD  nitroGLYCERIN (NITROSTAT) 0.4 MG SL tablet Place 1 tablet (0.4 mg total) under the tongue every 5 (five) minutes as needed for chest pain. 01/24/22   Corky Crafts, MD  ranolazine (RANEXA) 500 MG 12 hr tablet Take 1 tablet (500 mg total) by mouth 2 (two) times daily. 07/16/23   Marcelino Duster, PA  rosuvastatin (CRESTOR) 40 MG tablet TAKE 1 TABLET DAILY 05/18/23   Corky Crafts, MD  tamsulosin (FLOMAX) 0.4 MG CAPS capsule Take 0.4 mg by mouth daily after supper.    [provider]  traMADol (ULTRAM) 50 MG tablet Take 1 tablet (50 mg total) by mouth every 12 (twelve) hours as needed. Patient taking differently: Take 50 mg by mouth See admin instructions. Take 50mg  by mouth 1-2 times a day. 06/23/23   [place], Betty G, MD  warfarin (COUMADIN) 5 MG tablet TAKE AS  DIRECTED BY COUMADIN CLINIC Patient taking differently: Take 5 mg by mouth See admin instructions. Take 2.5 mg on Mon,Wed,Fri,Sat,and Sun. 5 mg on Tue and Thu. 08/08/22   Corky Crafts, MD      Allergies    Other, Imdur [isosorbide nitrate], and Latex    Review of Systems   Review of Systems  All other systems reviewed and are negative.   Physical Exam Updated Vital Signs BP 109/73 (BP Location: Right Arm)   Pulse (!) 53   Temp (!) 97.5 F (36.4 C) (Oral)   Resp 19   Ht 1.854 m (6\' 1" )   Wt 70.3 kg   SpO2 98%   BMI 20.45 kg/m  Physical Exam Vitals and nursing note reviewed.  Constitutional:      General: He is not in acute distress.    Appearance: Normal appearance. He is  well-developed. He is not toxic-appearing.  HENT:     Head: Normocephalic and atraumatic.  Eyes:     General: Lids are normal.     Conjunctiva/sclera: Conjunctivae normal.     Pupils: Pupils are equal, round, and reactive to light.  Neck:     Thyroid: No thyroid mass.     Trachea: No tracheal deviation.  Cardiovascular:     Rate and Rhythm: Normal rate and regular rhythm.     Heart sounds: Normal heart sounds. No murmur heard.    No gallop.  Pulmonary:     Effort: Pulmonary effort is normal. No respiratory distress.     Breath sounds: Normal breath sounds. No stridor. No decreased breath sounds, wheezing, rhonchi or rales.  Abdominal:     General: There is no distension.     Palpations: Abdomen is soft.     Tenderness: There is no abdominal tenderness. There is no rebound.  Musculoskeletal:        General: No tenderness. Normal range of motion.     Cervical back: Normal range of motion and neck supple.  Skin:    General: Skin is warm and dry.     Findings: No abrasion or rash.  Neurological:     Mental Status: He is alert and oriented to person, place, and time. Mental status is at baseline.     GCS: GCS eye subscore is 4. GCS verbal subscore is 5. GCS motor subscore is 6.     Cranial Nerves: Cranial nerves are intact. No cranial nerve deficit.     Sensory: No sensory deficit.     Motor: Motor function is intact.  Psychiatric:        Attention and Perception: Attention normal.        Speech: Speech normal.        Behavior: Behavior normal.     ED Results / Procedures / Treatments   Labs (all labs ordered are listed, but only abnormal results are displayed) Labs Reviewed  BASIC METABOLIC PANEL - Abnormal; Notable for the following components:      Result Value   Sodium 146 (*)    Glucose, Bld 100 (*)    All other components within normal limits  CBC - Abnormal; Notable for the following components:   WBC 2.5 (*)    RBC 3.43 (*)    Hemoglobin 10.7 (*)    HCT 32.4  (*)    All other components within normal limits  PROTIME-INR  TROPONIN I (HIGH SENSITIVITY)  TROPONIN I (HIGH SENSITIVITY)    EKG EKG Interpretation Date/Time:  Tuesday September 15 2023 11:44:34  EDT Ventricular Rate:  54 PR Interval:  138 QRS Duration:  108 QT Interval:  442 QTC Calculation: 419 R Axis:   -17  Text Interpretation: Sinus bradycardia Minimal voltage criteria for LVH, may be normal variant ( R in aVL ) Septal infarct , age undetermined Abnormal ECG When compared with ECG of 15-Jul-2023 06:58, PREVIOUS ECG IS PRESENT No significant change since last tracing Confirmed by Lorre Nick ([id number]) on 09/15/2023 2:07:45 PM  Radiology DG Chest 2 View  Result Date: 09/15/2023 CLINICAL DATA:  Chest pain. EXAM: CHEST - 2 VIEW COMPARISON:  Chest radiograph 07/14/2023. FINDINGS: Low lung volumes accentuate the pulmonary vasculature and cardiomediastinal silhouette. Unchanged tortuosity and calcifications of the thoracic aorta. No consolidation or pulmonary edema. No pleural effusion or pneumothorax. IMPRESSION: Low lung volumes without evidence of acute cardiopulmonary disease. Electronically Signed   By: Orvan Falconer M.D.   On: 09/15/2023 14:07    Procedures Procedures    Medications Ordered in ED Medications - No data to display  ED Course/ Medical Decision Making/ A&P                                 Medical Decision Making Amount and/or Complexity of Data Reviewed Labs: ordered. Radiology: ordered.   Patient is EKG interpretation shows no acute findings here.  Chest x-ray also shows no acute findings.  Findings x 2 are negative.  No evidence of this being a gastrointestinal process.  Low suspicion for ACS at this time.  Wife who is at bedside agrees.  Will discharge home and will follow-up with his doctor as needed        Final Clinical Impression(s) / ED Diagnoses Final diagnoses:  None    Rx / DC Orders ED Discharge Orders     None         Lorre Nick, MD 09/15/23 1546

## 2023-09-17 ENCOUNTER — Other Ambulatory Visit: Payer: Self-pay | Admitting: Physician Assistant

## 2023-09-17 ENCOUNTER — Telehealth: Payer: Self-pay | Admitting: Physician Assistant

## 2023-09-17 ENCOUNTER — Other Ambulatory Visit: Payer: Self-pay

## 2023-09-17 ENCOUNTER — Encounter: Payer: Self-pay | Admitting: Physician Assistant

## 2023-09-17 ENCOUNTER — Encounter (HOSPITAL_BASED_OUTPATIENT_CLINIC_OR_DEPARTMENT_OTHER): Payer: Medicare Other | Attending: General Surgery | Admitting: General Surgery

## 2023-09-17 DIAGNOSIS — Z87891 Personal history of nicotine dependence: Secondary | ICD-10-CM | POA: Insufficient documentation

## 2023-09-17 DIAGNOSIS — I10 Essential (primary) hypertension: Secondary | ICD-10-CM | POA: Diagnosis not present

## 2023-09-17 DIAGNOSIS — M1990 Unspecified osteoarthritis, unspecified site: Secondary | ICD-10-CM | POA: Diagnosis not present

## 2023-09-17 DIAGNOSIS — I8393 Asymptomatic varicose veins of bilateral lower extremities: Secondary | ICD-10-CM | POA: Diagnosis not present

## 2023-09-17 DIAGNOSIS — D6859 Other primary thrombophilia: Secondary | ICD-10-CM | POA: Insufficient documentation

## 2023-09-17 DIAGNOSIS — L97812 Non-pressure chronic ulcer of other part of right lower leg with fat layer exposed: Secondary | ICD-10-CM | POA: Diagnosis not present

## 2023-09-17 DIAGNOSIS — L97811 Non-pressure chronic ulcer of other part of right lower leg limited to breakdown of skin: Secondary | ICD-10-CM | POA: Diagnosis not present

## 2023-09-17 DIAGNOSIS — I2510 Atherosclerotic heart disease of native coronary artery without angina pectoris: Secondary | ICD-10-CM | POA: Insufficient documentation

## 2023-09-17 DIAGNOSIS — Z7901 Long term (current) use of anticoagulants: Secondary | ICD-10-CM | POA: Insufficient documentation

## 2023-09-17 DIAGNOSIS — I890 Lymphedema, not elsewhere classified: Secondary | ICD-10-CM | POA: Diagnosis not present

## 2023-09-17 MED ORDER — RANOLAZINE ER 500 MG PO TB12: 500.0000 mg | ORAL_TABLET | Freq: Two times a day (BID) | ORAL | 3 refills | Status: DC

## 2023-09-17 MED ORDER — MEMANTINE HCL 10 MG PO TABS: 10.0000 mg | ORAL_TABLET | Freq: Two times a day (BID) | ORAL | 0 refills | Status: DC

## 2023-09-17 NOTE — Progress Notes (Signed)
DEJEAN, TRIBBY ([id number]) [phone] [id number] Page 1 of 4 Visit Report for 09/17/2023 Abuse Risk Screen Details Patient Name: Date of Service: [name], [name] 09/17/2023 12:45 PM Medical Record Number: [id number] Patient Account Number: [id number] Date of Birth/Sex: Treating RN: [birth year] (77 y.o. [name] Primary Care Francisco Ostrovsky: [place], Betty Other Clinician: Referring Orland Visconti: Treating Duaine Radin/Extender: Cannon, Jennifer [place], Betty Weeks in Treatment: 0 Abuse Risk Screen Items Answer ABUSE RISK SCREEN: Has anyone close to you tried to hurt or harm you recentlyo No Do you feel uncomfortable with anyone in your familyo No Has anyone forced you do things that you didnt want to doo No Electronic Signature(s) Signed: 09/17/2023 3:36:06 PM By: Samuella Bruin Entered By: Samuella Bruin on 09/17/2023 10:06:47 -------------------------------------------------------------------------------- Activities of Daily Living Details Patient Name: Date of Service: [name], [name] 09/17/2023 12:45 PM Medical Record Number: [id number] Patient Account Number: [id number] Date of Birth/Sex: Treating RN: [birth year] (77 y.o. [name] Primary Care Jimmylee Ratterree: [place], Betty Other Clinician: Referring Neveen Daponte: Treating Kaelynne Christley/Extender: Cannon, Jennifer [place], Betty Weeks in Treatment: 0 Activities of Daily Living Items Answer Activities of Daily Living (Please select one for each item) Drive Automobile Not Able T Medications ake Need Assistance Use T elephone Completely Able Care for Appearance Need Assistance Use T oilet Completely Able Bath / Shower Need Assistance Dress Self Need Assistance Feed Self Completely Able Walk Need Assistance Get In / Out Bed Need Assistance Housework Not Able Prepare Meals Not Able Handle Money Need Assistance Shop for Self Not Able Electronic Signature(s) Signed: 09/17/2023  3:36:06 PM By: Samuella Bruin Entered By: Samuella Bruin on 09/17/2023 10:07:14 [name] ([id number]) [id number] Page 2 of 4 -------------------------------------------------------------------------------- Education Screening Details Patient Name: Date of Service: [name], [name] 09/17/2023 12:45 PM Medical Record Number: [id number] Patient Account Number: [id number] Date of Birth/Sex: Treating RN: 22-May-1946 (77 y.o. [name] Primary Care Buddie Marston: [place], Betty Other Clinician: Referring Yvonnia Tango: Treating Kaston Faughn/Extender: Cannon, Jennifer [place], Betty Weeks in Treatment: 0 Primary Learner Assessed: Patient Learning Preferences/Education Level/Primary Language Learning Preference: Explanation, Demonstration, Video, Printed Material Highest Education Level: College or Above Preferred Language: English Cognitive Barrier Language Barrier: No Translator Needed: No Memory Deficit: No Emotional Barrier: No Cultural/Religious Beliefs Affecting Medical Care: No Physical Barrier Impaired Vision: No Impaired Hearing: No Decreased Hand dexterity: No Knowledge/Comprehension Knowledge Level: Medium Comprehension Level: Medium Ability to understand written instructions: Medium Ability to understand verbal instructions: Medium Motivation Anxiety Level: Calm Cooperation: Cooperative Education Importance: Acknowledges Need Interest in Health Problems: Asks Questions Perception: Coherent Willingness to Engage in Self-Management Medium Activities: Readiness to Engage in Self-Management Medium Activities: Electronic Signature(s) Signed: 09/17/2023 3:36:06 PM By: Samuella Bruin Entered By: Samuella Bruin on 09/17/2023 10:07:42 -------------------------------------------------------------------------------- Fall Risk Assessment Details Patient Name: Date of Service: [name] 09/17/2023 12:45  PM Medical Record Number: [id number] Patient Account Number: [id number] Date of Birth/Sex: Treating RN: June 27, 1946 (77 y.o. [name] Primary Care Adiya Selmer: [place], Betty Other Clinician: Referring Marcello Tuzzolino: Treating Madoline Bhatt/Extender: Cannon, Jennifer [place], Betty Weeks in Treatment: 0 Fall Risk Assessment Items Have you had 2 or more falls in the last [address] [name], [name] ([id number]) [id number] Page 3 of 4 Have you had any fall that resulted in injury in the last 12 monthso 0 No FALLS RISK SCREEN History of falling - immediate or within 3 months 25 Yes Secondary diagnosis (Do you have 2 or more medical diagnoseso) 15 Yes Ambulatory aid None/bed rest/wheelchair/nurse 0 Yes Crutches/cane/walker  0 No Furniture 0 No Intravenous therapy Access/Saline/Heparin Lock 0 No Gait/Transferring Normal/ bed rest/ wheelchair 0 Yes Weak (short steps with or without shuffle, stooped but able to lift head while walking, may seek 0 No support from furniture) Impaired (short steps with shuffle, may have difficulty arising from chair, head down, impaired 0 No balance) Mental Status Oriented to own ability 0 Yes Electronic Signature(s) Signed: 09/17/2023 3:36:06 PM By: Samuella Bruin Entered By: Samuella Bruin on 09/17/2023 10:08:35 -------------------------------------------------------------------------------- Foot Assessment Details Patient Name: Date of Service: [name] 09/17/2023 12:45 PM Medical Record Number: [id number] Patient Account Number: [id number] Date of Birth/Sex: Treating RN: May 05, 1946 (77 y.o. [name] Primary Care Angi Goodell: [place], Betty Other Clinician: Referring Juliauna Stueve: Treating Blayre Papania/Extender: Cannon, Jennifer [place], Betty Weeks in Treatment: 0 Foot Assessment Items Site Locations + = Sensation present, - = Sensation absent, C = Callus, U = Ulcer R = Redness, W =  Warmth, M = Maceration, PU = Pre-ulcerative lesion F = Fissure, S = Swelling, D = Dryness Assessment Right: Left: Other Deformity: No No Prior Foot Ulcer: No No Prior Amputation: No No Charcot Joint: No No Ambulatory Status: Ambulatory With Help Assistance Device: Wheelchair [name], [name] ([id number]) [phone] [id number] Page 4 of 4 Gait: Unsteady Notes no hx of diabetes or neuropathy Electronic Signature(s) Signed: 09/17/2023 3:36:06 PM By: Samuella Bruin Entered By: Samuella Bruin on 09/17/2023 10:09:22 -------------------------------------------------------------------------------- Nutrition Risk Screening Details Patient Name: Date of Service: [name], [name] 09/17/2023 12:45 PM Medical Record Number: [id number] Patient Account Number: [id number] Date of Birth/Sex: Treating RN: 1946/02/10 (77 y.o. [name] Primary Care Jashley Yellin: [place], Betty Other Clinician: Referring Santino Kinsella: Treating Sidnie Swalley/Extender: Cannon, Jennifer [place], Betty Weeks in Treatment: 0 Height (in): 73 Weight (lbs): 153 Body Mass Index (BMI): 20.2 Nutrition Risk Screening Items Score Screening NUTRITION RISK SCREEN: I have an illness or condition that made me change the kind and/or amount of food I eat 0 No I eat fewer than two meals per day 0 No I eat few fruits and vegetables, or milk products 0 No I have three or more drinks of beer, liquor or wine almost every day 0 No I have tooth or mouth problems that make it hard for me to eat 0 No I don't always have enough money to buy the food I need 0 No I eat alone most of the time 0 No I take three or more different prescribed or over-the-counter drugs a day 1 Yes Without wanting to, I have lost or gained 10 pounds in the last six months 0 No I am not always physically able to shop, cook and/or feed myself 2 Yes Nutrition Protocols Good Risk Protocol Moderate Risk Protocol 0 Provide education  on nutrition High Risk Proctocol Risk Level: Moderate Risk Score: 3 Electronic Signature(s) Signed: 09/17/2023 3:36:06 PM By: Samuella Bruin Entered By: Samuella Bruin on 09/17/2023 10:08:57

## 2023-09-17 NOTE — Progress Notes (Signed)
[name] ([id number]) [id number] Page 1 of 8 Visit Report for 09/17/2023 Chief Complaint Document Details Patient Name: Date of Service: [name] 09/17/2023 12:45 PM Medical Record Number: [id number] Patient Account Number: [id number] Date of Birth/Sex: Treating RN: [birth year] (77 y.o. M) Primary Care Provider: [place], Lloyd Other Clinician: Referring Provider: Treating Provider/Extender: [name], Lloyd Weeks in Treatment: 0 Information Obtained from: Patient Chief Complaint Patient presents to the wound care center due with non-wound condition(s) 09/17/2023: blister due to LE edema Electronic Signature(s) Signed: 09/17/2023 1:48:31 PM By: Timothy Guess MD FACS Previous Signature: 09/17/2023 1:48:06 PM Version By: Timothy Guess MD FACS Entered By: [name] on 09/17/2023 10:48:31 -------------------------------------------------------------------------------- HPI Details Patient Name: Date of Service: [name] 09/17/2023 12:45 PM Medical Record Number: [id number] Patient Account Number: [id number] Date of Birth/Sex: Treating RN: 09/13/1946 (77 y.o. M) Primary Care Provider: [place], Lloyd Other Clinician: Referring Provider: Treating Provider/Extender: [name], Lloyd Weeks in Treatment: 0 History of Present Illness HPI Description: CONSULTATION ONLY 04/28/2023 This is a 77 year old man with a past medical history significant for coronary artery disease, hypertension, Alzheimer's disease, and a hypercoagulable state on chronic anticoagulation. He has had issues with bilateral lower extremity edema and is on Lasix. He had a left leg ulcer which his wife, who is a Engineer, civil (consulting), treated with silver alginate and a Tegaderm. This healed. He recently had a blister on his right lower extremity, but the time from his referral to actually getting his appointment with [place] has resulted in the  wound healing. ADMISSION 09/17/2023 He returns to clinic today with a large circular superficial ulcer on his right medial lower leg. This is a blister that ultimately broke open. He has not been wearing compression stockings as he says they are too hard to get on. Electronic Signature(s) Signed: 09/17/2023 1:49:21 PM By: Timothy Guess MD FACS Entered By: [name] on 09/17/2023 10:49:21 [name] ([id number]) [id number] Page 2 of 8 -------------------------------------------------------------------------------- Physical Exam Details Patient Name: Date of Service: [name] 09/17/2023 12:45 PM Medical Record Number: [id number] Patient Account Number: [id number] Date of Birth/Sex: Treating RN: 1946/02/01 (77 y.o. M) Primary Care Provider: [place], Lloyd Other Clinician: Referring Provider: Treating Provider/Extender: [name] [name] Weeks in Treatment: 0 Constitutional Hypotensive, asymptomatic. . . . No acute distress. Respiratory Normal work of breathing on room air. Notes 09/17/2023: On the medial aspect of his right lower leg, there is a large circular ulcer that is limited to breakdown of skin. There is already a fair amount of epithelialization present. The open areas are quite clean. Electronic Signature(s) Signed: 09/17/2023 1:50:14 PM By: Timothy Guess MD FACS Entered By: [name] on 09/17/2023 10:50:14 -------------------------------------------------------------------------------- Physician Orders Details Patient Name: Date of Service: [name] 09/17/2023 12:45 PM Medical Record Number: [id number] Patient Account Number: [id number] Date of Birth/Sex: Treating RN: 1946-01-25 (77 y.o. 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 - Dr. Lady Lloyd - room 2 Return A Anesthetic (In clinic) Topical Lidocaine 4%  applied to wound bed Bathing/ Shower/ Hygiene May shower with protection but do not get wound dressing(s) wet. Protect dressing(s) with water repellant cover (for example, large plastic bag) or a cast cover and may then take shower. Edema Control - Lymphedema / SCD / Other Elevate legs to the level of the heart or above for 30 minutes daily and/or when sitting for 3-4 times a day throughout the day. Avoid standing for long periods of time. Patient to wear own compression stockings every day. Moisturize legs daily. Wound Treatment Wound #1 - Lower Leg Wound Laterality: Right, Medial Cleanser: Soap and Water 1 x Per Week/30 Days Discharge Instructions: May shower and wash wound with dial antibacterial soap and water prior to dressing change. Cleanser: Wound Cleanser 1 x Per Week/30 Days [name] ([id number]) [phone].pdf Page 3 of 8 Discharge Instructions: Cleanse the wound with wound cleanser prior to applying a clean dressing using gauze sponges, not tissue or cotton balls. Peri-Wound Care: Sween Lotion (Moisturizing lotion) 1 x Per Week/30 Days Discharge Instructions: Apply moisturizing lotion as directed Prim Dressing: Maxorb Extra Ag+ Alginate Dressing, 4x4.75 (in/in) 1 x Per Week/30 Days ary Discharge Instructions: Apply to wound bed as instructed Secondary Dressing: ABD Pad, 8x10 1 x Per Week/30  Days Discharge Instructions: Apply over primary dressing as directed. Compression Wrap: Urgo K2 Lite, (equivalent to a 3 layer) two layer compression system, regular 1 x Per Week/30 Days Discharge Instructions: Apply Urgo K2 Lite as directed (alternative to 3 layer compression). Compression Stockings: Jobst Farrow Wrap 4000 (DME) Right Leg Compression Amount: 30-40 mmHG Discharge Instructions: Apply Renee Pain daily as instructed. Apply first thing in the morning, remove at night before bed. Patient Medications llergies: latex, Imdur A Notifications Medication Indication Start End 09/17/2023 lidocaine DOSE topical 4 % cream - cream topical Electronic Signature(s) Signed: 09/17/2023 2:34:17 PM By: Timothy Guess MD FACS Signed: 09/17/2023 3:36:06 PM By: [name] Entered By: [name] on 09/17/2023 11:24:47 -------------------------------------------------------------------------------- Problem List Details Patient Name: Date of Service: [name] 09/17/2023 12:45 PM Medical Record Number: [id number] Patient Account Number: [id number] Date of Birth/Sex: Treating RN: 1945/12/26 (77 y.o. 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 09/17/2023 No Yes Inactive Problems [name], [name] ([id number]) [phone].pdf Page 4 of 8 Resolved Problems Electronic Signature(s) Signed: 09/17/2023 1:47:52 PM By: Timothy Guess MD FACS Previous  Signature: 09/17/2023 1:08:28 PM Version By: Timothy Guess MD FACS Entered By: [name] on 09/17/2023 10:47:52 -------------------------------------------------------------------------------- Progress Note Details Patient Name: Date of Service: [name] 09/17/2023 12:45 PM Medical Record Number: [id number] Patient Account Number: [id number] Date of Birth/Sex: Treating RN: [birth year] (77 y.o. M) Primary Care Provider: [place], Lloyd Other Clinician: Referring Provider: Treating Provider/Extender: Ladarian Bonczek [name] Weeks in Treatment: 0 Subjective Chief Complaint Information obtained from Patient Patient presents to the wound care center due with non-wound condition(s) 09/17/2023: blister due to LE edema History of Present Illness (HPI) CONSULTATION ONLY 04/28/2023 This is a 77 year old man with a past medical history significant for coronary artery disease, hypertension, Alzheimer's disease, and a hypercoagulable state on chronic anticoagulation. He has had  issues with bilateral lower extremity edema and is on Lasix. He had a left leg ulcer which his wife, who is a Engineer, civil (consulting), treated with silver alginate and a Tegaderm. This healed. He recently had a blister on his right lower extremity, but the time from his referral to actually getting his appointment with [place] has resulted in the wound healing. ADMISSION 09/17/2023 He returns to clinic today with a large circular superficial ulcer on his right medial lower leg. This is a blister that ultimately broke open. He has not been wearing compression stockings as he says they are too hard to get on. Patient History Information obtained from Patient, Chart. Allergies latex, Imdur Family History Cancer, Lung Disease, No family history of Diabetes, Heart Disease, Hereditary Spherocytosis, Hypertension, Kidney Disease, Seizures, Stroke, Thyroid Problems, Tuberculosis. Social History Former smoker - quit at 37 yrs,  Marital Status - Married, Alcohol Use - Daily - a glass a day, Drug Use - No History, Caffeine Use - Daily. Medical History Eyes Patient has history of Cataracts Cardiovascular Patient has history of Arrhythmia - bradycardia, Coronary Artery Disease, Hypertension, Myocardial Infarction, Peripheral Venous Disease Musculoskeletal Patient has history of Osteoarthritis Hospitalization/Surgery History - Colonoscopy with propofol. - Polypectomy. - Inguinal hernia repair (Right). - Umbilical hernia repair. - Bypass graft. - Cataract extraction (Right). - Refractive surgery (Right). - skin biopsy. 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 [name], [name] ([id number]) [id number] Page 5 of 8 Ear/Nose/Mouth/Throat Denies complaints or symptoms of Chronic sinus problems or rhinitis. Respiratory Denies complaints or symptoms of Chronic or frequent coughs, Shortness of Breath. Gastrointestinal Denies complaints or symptoms of Frequent diarrhea, Nausea, Vomiting. Endocrine Denies complaints or symptoms of Heat/cold intolerance. Integumentary (Skin) Complains or has symptoms of Wounds. Neurologic Denies complaints or symptoms of Numbness/parasthesias. Psychiatric Denies complaints or symptoms of Claustrophobia. Objective Constitutional Hypotensive, asymptomatic. No acute distress. Vitals Time Taken: 1:04 PM, Height: 73 in, Source: Stated, Weight: 153 lbs, Source: Stated, BMI: 20.2, Temperature: 97.8 F, Pulse: 71 bpm, Respiratory Rate: 18 breaths/min, Blood Pressure: 90/56  mmHg. Respiratory Normal work of breathing on room air. General Notes: 09/17/2023: On the medial aspect of his right lower leg, there is a large circular ulcer that is limited to breakdown of skin. There is already a fair amount of epithelialization present. The open areas are quite clean. Integumentary (Hair, Skin) Wound #1 status is Open. Original cause of wound was Blister. The date acquired was: 08/27/2023. The wound is located on the Right,Medial Lower Leg. The wound measures 8cm length x 9.5cm width x 0.1cm depth; 59.69cm^2 area and 5.969cm^3 volume. There is Fat Layer (Subcutaneous Tissue) exposed. There is no tunneling or undermining noted. There is a medium amount of serous drainage noted. The wound margin is distinct with the outline attached to the wound base. There is large (67-100%) red granulation within the wound bed. There is no necrotic tissue within the wound bed. The periwound skin appearance had no abnormalities noted for texture. The periwound skin appearance had no abnormalities noted for moisture. The periwound skin appearance had no abnormalities noted for color. Periwound temperature was noted as No Abnormality. 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 There was a Double Layer Compression Therapy Procedure by Timothy Bruin, RN. Post procedure Diagnosis Wound #1: Same as Pre-Procedure Plan Follow-up Appointments: Return Appointment in 1 week. - Dr. Lady Lloyd - room 2 Anesthetic: (In clinic) Topical Lidocaine 4% applied to wound bed Bathing/ Shower/ Hygiene: May shower with protection but do not get wound dressing(s) wet. Protect  dressing(s) with water repellant cover (for example, large plastic bag) or a cast cover and may then take shower. Edema Control - Lymphedema / SCD / Other: Elevate legs to the level of the heart or above for 30 minutes daily and/or when sitting for 3-4 times a day throughout the day. [name], [name] ([id number]) [id number] Page 6 of 8 Avoid standing for long periods of time. Patient to wear own compression stockings every day. Moisturize legs daily. The following medication(s) was prescribed: lidocaine topical 4 % cream cream topical was prescribed at facility WOUND #1: - Lower Leg Wound Laterality: Right, Medial Cleanser: Soap and Water 1 x Per Week/30 Days Discharge Instructions: May shower and wash wound with dial antibacterial soap and water prior to dressing change. Cleanser: Wound Cleanser 1 x Per Week/30 Days Discharge Instructions: Cleanse the wound with wound cleanser prior to applying a clean dressing using gauze sponges, not tissue or cotton balls. Peri-Wound Care: Sween Lotion (Moisturizing lotion) 1 x Per Week/30 Days Discharge Instructions: Apply moisturizing lotion as directed Prim Dressing: Maxorb Extra Ag+ Alginate Dressing, 4x4.75 (in/in) 1 x Per Week/30 Days ary Discharge Instructions: Apply to wound bed as instructed Secondary Dressing: ABD Pad, 8x10 1 x Per Week/30 Days Discharge Instructions: Apply over primary dressing as directed. Com pression Wrap: Urgo K2 Lite, (equivalent to a 3 layer) two layer compression system, regular 1 x Per Week/30 Days Discharge Instructions: Apply Urgo K2 Lite as directed (alternative to 3 layer compression). Com pression Stockings: Jobst Farrow Wrap 4000 (DME) Compression Amount: 30-40 mmHg (right) Discharge Instructions: Apply Renee Pain daily as instructed. Apply first thing in the morning, remove at night before bed. 09/17/2023: He returns to clinic today with a new wound secondary to uncontrolled  swelling. On the medial aspect of his right lower leg, there is a large circular ulcer that is limited to breakdown of skin. There is already a fair amount of epithelialization present. The open areas are quite clean. No debridement was necessary today. We will apply silver alginate and a 3 layer compression equivalent wrap. We are going to order him a Farrow wrap to encourage compliance with compression. He was reminded to keep his legs elevated as much as possible throughout the day and at night while he is sleeping. I anticipate that he will likely be healed at his follow-up visit in 1 week's time. Electronic Signature(s) Signed: 09/17/2023 2:34:17 PM By: Timothy Guess MD FACS Signed: 09/17/2023 3:36:06 PM By: [name] Previous Signature: 09/17/2023 1:51:39 PM Version By: Timothy Guess MD FACS Entered By: [name] on 09/17/2023 11:25:00 -------------------------------------------------------------------------------- HxROS Details Patient Name: Date of Service: [name], [name] 09/17/2023 12:45 PM Medical Record Number: [id number] Patient Account Number: [id number] Date of Birth/Sex: Treating RN: 1946-02-04 (77 y.o. [name] Primary Care Provider: [place], Lloyd Other Clinician: Referring Provider: Treating Provider/Extender: Kavion Mancinas [name] Weeks in Treatment: 0 Information Obtained From Patient Chart Constitutional Symptoms (General Health) Complaints and Symptoms: Negative for: Fatigue; Fever;  Chills; Marked Weight Change Ear/Nose/Mouth/Throat Complaints and Symptoms: Negative for: Chronic sinus problems or rhinitis Respiratory Complaints and Symptoms: Negative for: Chronic or frequent coughs; Shortness of Breath Gastrointestinal Complaints and Symptoms: Negative for: Frequent diarrhea; Nausea; Vomiting Endocrine [name], [name] ([id number]) [phone].pdf Page 7 of 8 Complaints and  Symptoms: Negative for: Heat/cold intolerance Integumentary (Skin) Complaints and Symptoms: Positive for: Wounds Neurologic Complaints and Symptoms: Negative for: Numbness/parasthesias Medical History: Past Medical History Notes: 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 Psychiatric Complaints and Symptoms: Negative for: Claustrophobia Eyes Medical History: Positive for: Cataracts Past Medical History Notes: hypertensive retinopathy Hematologic/Lymphatic Medical History: Past Medical History Notes: primary hypercoagulable state Cardiovascular Medical History: Positive for: Arrhythmia - bradycardia; Coronary Artery Disease; Hypertension; Myocardial Infarction; Peripheral Venous Disease Past Medical History Notes: varicose veins Genitourinary Medical History: Past Medical History Notes: urinary retention Immunological Musculoskeletal Medical History: Positive for: Osteoarthritis Oncologic HBO Extended History Items Eyes: Cataracts Immunizations Pneumococcal Vaccine: Received Pneumococcal Vaccination: Yes Received Pneumococcal Vaccination On or After 60th Birthday: Yes Implantable Devices None Hospitalization / Surgery History Type of Hospitalization/Surgery Colonoscopy with propofol Polypectomy Inguinal hernia repair (Right) Umbilical hernia repair Bypass graft [name], [name] ([id number]) [id number] Page 8 of 8 Cataract extraction (Right) Refractive surgery (Right) skin biopsy Family and Social History Cancer: Yes; Diabetes: No; Heart Disease: No; Hereditary Spherocytosis: No; Hypertension: No; Kidney Disease: No; Lung Disease: Yes; Seizures: No; Stroke: No; Thyroid Problems: No; Tuberculosis: No; Former smoker - quit at 37 yrs; Marital Status - Married; Alcohol Use: Daily - a glass a day; Drug Use: No History; Caffeine Use: Daily; Financial Concerns: No; Food, Clothing or Shelter Needs:  No; Support System Lacking: No; Transportation Concerns: No Psychologist, prison and probation services) Signed: 09/17/2023 2:34:17 PM By: Timothy Guess MD FACS Signed: 09/17/2023 3:36:06 PM By: Gelene Mink By: [name] on 09/17/2023 10:06:42 -------------------------------------------------------------------------------- SuperBill Details Patient Name: Date of Service: [name] 09/17/2023 Medical Record Number: [id number] Patient Account Number: [id number] Date of Birth/Sex: Treating RN: Aug 10, 1946 (77 y.o. 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 Facility Procedures : CPT4 Code: [id number] Description: 99213 - WOUND CARE VISIT-LEV 3 EST PT Modifier: Quantity: 1 Physician Procedures : CPT4 Code Description Modifier [id number] 99213 - WC PHYS LEVEL 3 - EST PT ICD-10 Diagnosis Description L97.811 Non-pressure chronic ulcer of other part of right lower leg limited to breakdown of skin I87.2 Venous insufficiency (chronic) (peripheral)  I83.93 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

## 2023-09-17 NOTE — Telephone Encounter (Signed)
Pt's medication was sent to pt's pharmacy as requested. Confirmation received.  °

## 2023-09-17 NOTE — Progress Notes (Signed)
[name], [name] ([id number]) [id number] Page 1 of 9 Visit Report for 09/17/2023 Allergy List Details Patient Name: Date of Service: [name], [name] 09/17/2023 12:45 PM Medical Record Number: [id number] Patient Account Number: [id number] Date of Birth/Sex: Treating RN: 1945-12-23 (77 y.o. Marlan Palau Primary Care Chellsea Beckers: [place], Betty Other Clinician: Referring Laruth Hanger: Treating Zela Sobieski/Extender: Cannon, Jennifer [place], Betty Weeks in Treatment: 0 Allergies Active Allergies latex Imdur Allergy Notes Electronic Signature(s) Signed: 09/17/2023 3:36:06 PM By: Gelene Mink By: Samuella Bruin on 09/17/2023 10:04:36 -------------------------------------------------------------------------------- Arrival Information Details Patient Name: Date of Service: [name] 09/17/2023 12:45 PM Medical Record Number: [id number] Patient Account Number: [id number] Date of Birth/Sex: Treating RN: 03/01/1946 (77 y.o. Marlan Palau Primary Care Aryssa Rosamond: [place], Betty Other Clinician: Referring Ella Golomb: Treating Rodert Hinch/Extender: Cannon, Jennifer [place], Betty Weeks in Treatment: 0 Visit Information Patient Arrived: Wheel Chair Arrival Time: 12:57 Accompanied By: wife Transfer Assistance: Manual Patient Identification Verified: Yes Secondary Verification Process Completed: Yes History Since Last Visit Added or deleted any medications: No Any new allergies or adverse reactions: Yes Pain Present Now: No Electronic Signature(s) Signed: 09/17/2023 3:36:06 PM By: Samuella Bruin Entered By: Samuella Bruin on 09/17/2023 10:19:07 -------------------------------------------------------------------------------- Clinic Level of Care Assessment Details Patient Name: Date of Service: [name], [name] 09/17/2023 12:45 PM Medical Record Number: [id number] Patient Account Number: [id number] [name], [name]  ([id number]) [id number] Page 2 of 9 Date of Birth/Sex: Treating RN: 07-11-1946 (77 y.o. Marlan Palau Primary Care Dwanna Goshert: [place], Betty Other Clinician: Referring Cadi Rhinehart: Treating Obie Kallenbach/Extender: Cannon, Jennifer [place], Betty Weeks in Treatment: 0 Clinic Level of Care Assessment Items TOOL 2 Quantity Score X- 1 0 Use when only an EandM is performed on the INITIAL visit ASSESSMENTS - Nursing Assessment / Reassessment X- 1 20 General Physical Exam (combine w/ comprehensive assessment (listed just below) when performed on new pt. evals) X- 1 25 Comprehensive Assessment (HX, ROS, Risk Assessments, Wounds Hx, etc.) ASSESSMENTS - Wound and Skin A ssessment / Reassessment X - Simple Wound Assessment / Reassessment - one wound 1 5 []  - 0 Complex Wound Assessment / Reassessment - multiple wounds []  - 0 Dermatologic / Skin Assessment (not related to wound area) ASSESSMENTS - Ostomy and/or Continence Assessment and Care []  - 0 Incontinence Assessment and Management []  - 0 Ostomy Care Assessment and Management (repouching, etc.) PROCESS - Coordination of Care X - Simple Patient / Family Education for ongoing care 1 15 []  - 0 Complex (extensive) Patient / Family Education for ongoing care X- 1 10 Staff obtains Chiropractor, Records, T Results / Process Orders est []  - 0 Staff telephones HHA, Nursing Homes / Clarify orders / etc []  - 0 Routine Transfer to another Facility (non-emergent condition) []  - 0 Routine Hospital Admission (non-emergent condition) X- 1 15 New Admissions / Manufacturing engineer / Ordering NPWT Apligraf, etc. , []  - 0 Emergency Hospital Admission (emergent condition) X- 1 10 Simple Discharge Coordination []  - 0 Complex (extensive) Discharge Coordination PROCESS - Special Needs []  - 0 Pediatric / Minor Patient Management []  - 0 Isolation Patient Management []  - 0 Hearing / Language / Visual special needs []  -  0 Assessment of Community assistance (transportation, D/C planning, etc.) []  - 0 Additional assistance / Altered mentation []  - 0 Support Surface(s) Assessment (bed, cushion, seat, etc.) INTERVENTIONS - Wound Cleansing / Measurement X- 1 5 Wound Imaging (photographs - any number of wounds) []  - 0 Wound Tracing (instead of photographs) X- 1 5 Simple Wound Measurement - one  wound []  - 0 Complex Wound Measurement - multiple wounds X- 1 5 Simple Wound Cleansing - one wound []  - 0 Complex Wound Cleansing - multiple wounds INTERVENTIONS - Wound Dressings []  - 0 Small Wound Dressing one or multiple wounds X- 1 15 Medium Wound Dressing one or multiple wounds []  - 0 Large Wound Dressing one or multiple wounds []  - 0 Application of Medications - injection [name], [name] ([id number]) [phone] Page 3 of 9 INTERVENTIONS - Miscellaneous []  - 0 External ear exam []  - 0 Specimen Collection (cultures, biopsies, blood, body fluids, etc.) []  - 0 Specimen(s) / Culture(s) sent or taken to Lab for analysis []  - 0 Patient Transfer (multiple staff / Michiel Sites Lift / Similar devices) []  - 0 Simple Staple / Suture removal (25 or less) []  - 0 Complex Staple / Suture removal (26 or more) []  - 0 Hypo / Hyperglycemic Management (close monitor of Blood Glucose) []  - 0 Ankle / Brachial Index (ABI) - do not check if billed separately Has the patient been seen at the hospital within the last three years: Yes Total Score: 130 Level Of Care: New/Established - Level 4 Electronic Signature(s) Signed: 09/17/2023 3:36:06 PM By: Samuella Bruin Entered By: Samuella Bruin on 09/17/2023 10:55:11 -------------------------------------------------------------------------------- Compression Therapy Details Patient Name: Date of Service: [name] 09/17/2023 12:45 PM Medical Record Number: [id number] Patient Account Number: [id number] Date of Birth/Sex: Treating  RN: 1946-10-16 (77 y.o. Marlan Palau Primary Care Everly Rubalcava: [place], Betty Other Clinician: Referring Draco Malczewski: Treating Alexxander Kurt/Extender: Cannon, Jennifer [place], Betty Weeks in Treatment: 0 Compression Therapy Performed for Wound Assessment: Wound #1 Right,Medial Lower Leg Performed By: Clinician Samuella Bruin, RN Compression Type: Double Layer Post Procedure Diagnosis Same as Pre-procedure Electronic Signature(s) Signed: 09/17/2023 3:36:06 PM By: Gelene Mink By: Samuella Bruin on 09/17/2023 10:38:24 -------------------------------------------------------------------------------- Encounter Discharge Information Details Patient Name: Date of Service: [name] 09/17/2023 12:45 PM Medical Record Number: [id number] Patient Account Number: [id number] Date of Birth/Sex: Treating RN: Jul 29, 1946 (77 y.o. Marlan Palau Primary Care Fleet Higham: [place], Betty Other Clinician: Referring Helayne Metsker: Treating Romona Murdy/Extender: Cannon, Jennifer [place], Betty Weeks in Treatment: 0 Encounter Discharge Information Items Discharge Condition: Stable Ambulatory Status: Wheelchair Discharge Destination: Home Transportation: Private Auto Accompanied By: wife Schedule Follow-up Appointment: [name], [name] ([id number]) [id number] Page 4 of 9 Clinical Summary of Care: Patient Declined Electronic Signature(s) Signed: 09/17/2023 3:36:06 PM By: Gelene Mink By: Samuella Bruin on 09/17/2023 11:22:19 -------------------------------------------------------------------------------- Lower Extremity Assessment Details Patient Name: Date of Service: [name], [name] 09/17/2023 12:45 PM Medical Record Number: [id number] Patient Account Number: [id number] Date of Birth/Sex: Treating RN: 1946-02-13 (77 y.o. Marlan Palau Primary Care Livianna Petraglia: [place], Betty Other Clinician: Referring  Chinara Hertzberg: Treating Rishit Burkhalter/Extender: Cannon, Jennifer [place], Betty Weeks in Treatment: 0 Edema Assessment Assessed: [Left: No] [Right: No] [Left: Edema] [Right: :] Calf Left: Right: Point of Measurement: From Medial Instep 36.2 cm Ankle Left: Right: Point of Measurement: From Medial Instep 21 cm Knee To Floor Left: Right: From Medial Instep 54 cm Vascular Assessment Pulses: Dorsalis Pedis Palpable: [Right:Yes] Extremity colors, hair growth, and conditions: Extremity Color: [Right:Normal] Hair Growth on Extremity: [Right:No] Temperature of Extremity: [Right:Warm] Capillary Refill: [Right:< 3 seconds] Dependent Rubor: [Right:No] Blanched when Elevated: [Right:No No] Electronic Signature(s) Signed: 09/17/2023 3:36:06 PM By: Samuella Bruin Entered By: Samuella Bruin on 09/17/2023 10:38:53 -------------------------------------------------------------------------------- Multi Wound Chart Details Patient Name: Date of Service: [name] 09/17/2023 12:45 PM Medical Record Number: [id number] Patient Account Number: [id number] Date  of Birth/Sex: Treating RN: 04-Sep-1946 (77 y.o. M) Primary Care Ardell Makarewicz: [place], Betty Other Clinician: ARLANDO, [name] ([id number]) [id number] Page 5 of 9 Referring Frantz Quattrone: Treating Annamary Buschman/Extender: Cannon, Jennifer [place], Betty Weeks in Treatment: 0 Vital Signs Height(in): 73 Pulse(bpm): 71 Weight(lbs): 153 Blood Pressure(mmHg): 90/56 Body Mass Index(BMI): 20.2 Temperature(F): 97.8 Respiratory Rate(breaths/min): 18 [1:Photos:] [N/A:N/A] Right, Medial Lower Leg N/A N/A Wound Location: Blister N/A N/A Wounding Event: Lymphedema N/A N/A Primary Etiology: Cataracts, Arrhythmia, Coronary N/A N/A Comorbid History: Artery Disease, Hypertension, Myocardial Infarction, Peripheral Venous Disease, Osteoarthritis 08/27/2023 N/A N/A Date Acquired: 0 N/A N/A Weeks of Treatment: Open N/A  N/A Wound Status: No N/A N/A Wound Recurrence: 8x9.5x0.1 N/A N/A Measurements L x W x D (cm) 59.69 N/A N/A A (cm) : rea 5.969 N/A N/A Volume (cm) : Full Thickness Without Exposed N/A N/A Classification: Support Structures Medium N/A N/A Exudate Amount: Serous N/A N/A Exudate Type: amber N/A N/A Exudate Color: Distinct, outline attached N/A N/A Wound Margin: Large (67-100%) N/A N/A Granulation Amount: Red N/A N/A Granulation Quality: None Present (0%) N/A N/A Necrotic Amount: Fat Layer (Subcutaneous Tissue): Yes N/A N/A Exposed Structures: Fascia: No Tendon: No Muscle: No Joint: No Bone: No Medium (34-66%) N/A N/A Epithelialization: No Abnormalities Noted N/A N/A Periwound Skin Texture: No Abnormalities Noted N/A N/A Periwound Skin Moisture: No Abnormalities Noted N/A N/A Periwound Skin Color: No Abnormality N/A N/A Temperature: Compression Therapy N/A N/A Procedures Performed: Treatment Notes Electronic Signature(s) Signed: 09/17/2023 1:47:58 PM By: Duanne Guess MD FACS Entered By: Duanne Guess on 09/17/2023 10:47:57 -------------------------------------------------------------------------------- Multi-Disciplinary Care Plan Details Patient Name: Date of Service: [name] 09/17/2023 12:45 PM Medical Record Number: [id number] Patient Account Number: [id number] Date of Birth/Sex: Treating RN: December 11, 1945 (77 y.o. Marlan Palau Primary Care Vaughn Frieze: [place], Betty Other Clinician: XYLER, [name] ([id number]) [id number] Page 6 of 9 Referring Kordelia Severin: Treating Xenia Nile/Extender: Cannon, Jennifer [place], Betty Weeks in Treatment: 0 Active Inactive Abuse / Safety / Falls / Self Care Management Nursing Diagnoses: Impaired physical mobility Potential for falls Goals: Patient will remain injury free Date Initiated: 09/17/2023 Target Resolution Date: 10/16/2023 Goal Status: Active Patient/caregiver  will verbalize/demonstrate measures taken to prevent injury and/or falls Date Initiated: 09/17/2023 Target Resolution Date: 10/16/2023 Goal Status: Active Interventions: Assess self care needs on admission and as needed Provide education on fall prevention Notes: Wound/Skin Impairment Nursing Diagnoses: Impaired tissue integrity Knowledge deficit related to ulceration/compromised skin integrity Goals: Patient/caregiver will verbalize understanding of skin care regimen Date Initiated: 09/17/2023 Target Resolution Date: 11/13/2023 Goal Status: Active Interventions: Assess ulceration(s) every visit Treatment Activities: Skin care regimen initiated : 09/17/2023 Topical wound management initiated : 09/17/2023 Notes: Electronic Signature(s) Signed: 09/17/2023 3:36:06 PM By: Samuella Bruin Entered By: Samuella Bruin on 09/17/2023 11:21:51 -------------------------------------------------------------------------------- Pain Assessment Details Patient Name: Date of Service: [name] 09/17/2023 12:45 PM Medical Record Number: [id number] Patient Account Number: [id number] Date of Birth/Sex: Treating RN: 11/14/1946 (77 y.o. Marlan Palau Primary Care Leone Putman: [place], Betty Other Clinician: Referring Quinterrius Errington: Treating Narya Beavin/Extender: Cannon, Jennifer [place], Betty Weeks in Treatment: 0 Active Problems Location of Pain Severity and Description of Pain Patient Has Paino No Site Locations Duration of the Pain. [name], [name] ([id number]) [id number] Page 7 of 9 Duration of the Pain. Constant / Intermittento Constant Rate the pain. Current Pain Level: 0 Pain Management and Medication Current Pain Management: Electronic Signature(s) Signed: 09/17/2023 3:36:06 PM By: Samuella Bruin Entered By: Samuella Bruin on 09/17/2023  10:19:00 -------------------------------------------------------------------------------- Patient/Caregiver Education Details Patient Name: Date of  Service: [name] 10/10/2024andnbsp12:45 PM Medical Record Number: [id number] Patient Account Number: [id number] Date of Birth/Gender: Treating RN: March 08, 1946 (77 y.o. Marlan Palau Primary Care Physician: [place], Betty Other Clinician: Referring Physician: Treating Physician/Extender: Cannon, Jennifer [place], Betty Weeks in Treatment: 0 Education Assessment Education Provided To: Patient Education Topics Provided Wound/Skin Impairment: Methods: Explain/Verbal Responses: Reinforcements needed, State content correctly Electronic Signature(s) Signed: 09/17/2023 3:36:06 PM By: Gelene Mink By: Samuella Bruin on 09/17/2023 10:53:34 -------------------------------------------------------------------------------- Wound Assessment Details Patient Name: Date of Service: [name] 09/17/2023 12:45 PM Medical Record Number: [id number] Patient Account Number: [id number] Date of Birth/Sex: Treating RN: 1946/04/16 (77 y.o. Marlan Palau Primary Care Wesson Stith: [place], Betty Other Clinician: Referring Keyondre Hepburn: Treating Edwyna Dangerfield/Extender: Cannon, Jennifer [place], Betty Steele, Leavy J ([id number]) [id number] Page 8 of 9 Weeks in Treatment: 0 Wound Status Wound Number: 1 Primary Lymphedema Etiology: Wound Location: Right, Medial Lower Leg Wound Open Wounding Event: Blister Status: Date Acquired: 08/27/2023 Comorbid Cataracts, Arrhythmia, Coronary Artery Disease, Hypertension, Weeks Of Treatment: 0 History: Myocardial Infarction, Peripheral Venous Disease, Osteoarthritis Clustered Wound: No Photos Wound Measurements Length: (cm) 8 Width: (cm) 9.5 Depth: (cm) 0.1 Area: (cm) 59.69 Volume: (cm) 5.969 % Reduction in Area: % Reduction in  Volume: Epithelialization: Medium (34-66%) Tunneling: No Undermining: No Wound Description Classification: Full Thickness Without Exposed Support Structures Wound Margin: Distinct, outline attached Exudate Amount: Medium Exudate Type: Serous Exudate Color: amber Foul Odor After Cleansing: No Slough/Fibrino No Wound Bed Granulation Amount: Large (67-100%) Exposed Structure Granulation Quality: Red Fascia Exposed: No Necrotic Amount: None Present (0%) Fat Layer (Subcutaneous Tissue) Exposed: Yes Tendon Exposed: No Muscle Exposed: No Joint Exposed: No Bone Exposed: No Periwound Skin Texture Texture Color No Abnormalities Noted: Yes No Abnormalities Noted: Yes Moisture Temperature / Pain No Abnormalities Noted: Yes Temperature: No Abnormality Treatment Notes Wound #1 (Lower Leg) Wound Laterality: Right, Medial Cleanser Soap and Water Discharge Instruction: May shower and wash wound with dial antibacterial soap and water prior to dressing change. Wound Cleanser Discharge Instruction: Cleanse the wound with wound cleanser prior to applying a clean dressing using gauze sponges, not tissue or cotton balls. Peri-Wound Care Sween Lotion (Moisturizing lotion) Discharge Instruction: Apply moisturizing lotion as directed Topical Primary Dressing Maxorb Extra Ag+ Alginate Dressing, 4x4.75 (in/in) Discharge Instruction: Apply to wound bed as instructed AMIN, FORNWALT ([id number]) [phone] Page 9 of 9 Secondary Dressing ABD Pad, 8x10 Discharge Instruction: Apply over primary dressing as directed. Secured With Compression Wrap Urgo K2 Lite, (equivalent to a 3 layer) two layer compression system, regular Discharge Instruction: Apply Urgo K2 Lite as directed (alternative to 3 layer compression). Compression Stockings Jobst Farrow Wrap 4000 Quantity: 1 Right Leg Compression Amount: 30-40 mmHg Discharge Instruction: Apply Renee Pain daily as  instructed. Apply first thing in the morning, remove at night before bed. Add-Ons Electronic Signature(s) Signed: 09/17/2023 3:36:06 PM By: Gelene Mink By: Samuella Bruin on 09/17/2023 10:17:39 -------------------------------------------------------------------------------- Vitals Details Patient Name: Date of Service: [name] 09/17/2023 12:45 PM Medical Record Number: [id number] Patient Account Number: [id number] Date of Birth/Sex: Treating RN: 14-Aug-1946 (77 y.o. Marlan Palau Primary Care Shavanna Furnari: [place], Betty Other Clinician: Referring Maury Bamba: Treating Eligah Anello/Extender: Cannon, Jennifer [place], Betty Weeks in Treatment: 0 Vital Signs Time Taken: 13:04 Temperature (F): 97.8 Height (in): 73 Pulse (bpm): 71 Source: Stated Respiratory Rate (breaths/min): 18 Weight (lbs): 153 Blood Pressure (mmHg): 90/56 Source: Stated Reference Range: 80 - 120 mg / dl Body Mass Index (BMI): 20.2 Electronic Signature(s) Signed: 09/17/2023  3:36:06 PM By: Gelene Mink By: Samuella Bruin on 09/17/2023 10:05:49

## 2023-09-17 NOTE — Telephone Encounter (Signed)
*  STAT* If patient is at the pharmacy, call can be transferred to refill team.   1. Which medications need to be refilled? (please list name of each medication and dose if known)   ranolazine (RANEXA) 500 MG 12 hr tablet    2. Which pharmacy/location (including street and city if local pharmacy) is medication to be sent to?[facility] - [place], [place] - [address]   3. Do they need a 30 day or 90 day supply? 90 day

## 2023-09-17 NOTE — Telephone Encounter (Signed)
I sent in refill of memantine 10mg  bid #28 enough for mail order. Lupita Leash thanked me for calling and fixing this.

## 2023-09-18 ENCOUNTER — Telehealth: Payer: Medicare Other | Admitting: Family Medicine

## 2023-09-21 ENCOUNTER — Ambulatory Visit (INDEPENDENT_AMBULATORY_CARE_PROVIDER_SITE_OTHER): Payer: Medicare Other | Admitting: Family Medicine

## 2023-09-21 ENCOUNTER — Encounter: Payer: Self-pay | Admitting: Family Medicine

## 2023-09-21 VITALS — BP 110/70 | HR 76 | Resp 16 | Ht 73.0 in

## 2023-09-21 DIAGNOSIS — D509 Iron deficiency anemia, unspecified: Secondary | ICD-10-CM

## 2023-09-21 DIAGNOSIS — M159 Polyosteoarthritis, unspecified: Secondary | ICD-10-CM | POA: Diagnosis not present

## 2023-09-21 DIAGNOSIS — R0789 Other chest pain: Secondary | ICD-10-CM

## 2023-09-21 DIAGNOSIS — K5903 Drug induced constipation: Secondary | ICD-10-CM | POA: Diagnosis not present

## 2023-09-21 DIAGNOSIS — T402X5A Adverse effect of other opioids, initial encounter: Secondary | ICD-10-CM | POA: Diagnosis not present

## 2023-09-21 NOTE — Progress Notes (Signed)
ACUTE VISIT Chief Complaint  Patient presents with   Follow-up   HPI: [name] is a 77 y.o. male with a PMHx significant for CAD, HTN,  Alzheimer's disease, HLD, and chronic pain here today with his wife for ER follow up.   He was seen in the ER for chest pain on 10/8. He took four nitroglycerin at home which did not help. An EKG and chest x-ray performed in the ER did not show acute findings.  His wife states he had not had chest pain for two months prior to the ER visit, and has not had any since.  He denies palpitations, heartburn, or belching.   His wife also notes he has a large wound on his right lower extremity, pretibial area above ankle. He  is following with wound clinic and has his next appt on 10/21.  Patient is currently complaining of pain from the top of his head to neck and his lower back. He endorses generalized body aches. Difficulty breathing through his nose, nasal congestion attributed to allergies. Lab Results  Component Value Date   NA 146 (H) 09/15/2023   CL 105 09/15/2023   K 3.5 09/15/2023   CO2 27 09/15/2023   BUN 20 09/15/2023   CREATININE 1.15 09/15/2023   GFRNONAA >60 09/15/2023   CALCIUM 10.2 09/15/2023   ALBUMIN 2.6 (L) 09/15/2023   GLUCOSE 100 (H) 09/15/2023   Lab Results  Component Value Date   ALT 14 09/15/2023   AST 19 09/15/2023   ALKPHOS 23 (L) 09/15/2023   BILITOT 0.6 09/15/2023      Latest Ref Rng & Units 09/15/2023   11:42 AM 07/15/2023    7:23 AM 07/14/2023   12:46 PM  CBC  WBC 4.0 - 10.5 K/uL 2.5  4.1  3.5   Hemoglobin 13.0 - 17.0 g/dL 16.1  09.6  04.5   Hematocrit 39.0 - 52.0 % 32.4  37.9  36.2   Platelets 150 - 400 K/uL 227  200  222    Chronic pain: He has not taken Tramadol in a few weeks, since he was evaluated in the ED for fecal impaction, 08/07/23. He has seen GI, Movantik was recommended. He has not needed medication since stopping Tramadol.  Review of Systems  Constitutional:  Positive for fatigue.  Negative for appetite change, chills and fever.  Respiratory:  Negative for cough and wheezing.   Gastrointestinal:  Negative for abdominal pain, blood in stool, nausea and vomiting.  Genitourinary:  Negative for decreased urine volume, dysuria and hematuria.  Musculoskeletal:  Positive for arthralgias, back pain and gait problem.  Skin:  Negative for rash.  Neurological:  Negative for syncope and facial asymmetry.  See other pertinent positives and negatives in HPI.  Current Outpatient Medications on File Prior to Visit  Medication Sig Dispense Refill   amLODipine (NORVASC) 5 MG tablet TAKE ONE AND ONE-HALF TABLETS DAILY (Patient taking differently: Take 5 mg by mouth daily.) 135 tablet 3   cholecalciferol (VITAMIN D) 1000 units tablet Take 1,000 Units by mouth daily.      clindamycin (CLEOCIN) 300 MG capsule Take 1 capsule (300 mg total) by mouth 4 (four) times daily. 40 capsule 0   Cyanocobalamin (VITAMIN B12) 500 MCG TABS Take 500 mcg by mouth daily.      donepezil (ARICEPT) 10 MG tablet TAKE 1 TABLET AT BEDTIME 90 tablet 1   ezetimibe (ZETIA) 10 MG tablet TAKE 1 TABLET AT BEDTIME 90 tablet 3   fenofibrate (TRICOR)  145 MG tablet TAKE 1 TABLET AT BEDTIME 90 tablet 3   finasteride (PROSCAR) 5 MG tablet Take 5 mg by mouth daily.     furosemide (LASIX) 40 MG tablet Take 1 tablet (40 mg total) by mouth every other day. 45 tablet 1   losartan (COZAAR) 50 MG tablet Take 1 tablet (50 mg total) by mouth daily. 30 tablet 3   memantine (NAMENDA) 10 MG tablet Take 1 tablet (10 mg total) by mouth 2 (two) times daily. 60 tablet 0   memantine (NAMENDA) 10 MG tablet Take 1 tablet (10 mg total) by mouth 2 (two) times daily. 28 tablet 0   naloxegol oxalate (MOVANTIK) 25 MG TABS tablet Take 1 tablet (25 mg total) by mouth daily. 30 tablet 1   nitroGLYCERIN (NITROSTAT) 0.4 MG SL tablet Place 1 tablet (0.4 mg total) under the tongue every 5 (five) minutes as needed for chest pain. 75 tablet 2   ranolazine  (RANEXA) 500 MG 12 hr tablet Take 1 tablet (500 mg total) by mouth 2 (two) times daily. 180 tablet 3   rosuvastatin (CRESTOR) 40 MG tablet TAKE 1 TABLET DAILY 90 tablet 3   tamsulosin (FLOMAX) 0.4 MG CAPS capsule Take 0.4 mg by mouth daily after supper.     traMADol (ULTRAM) 50 MG tablet Take 1 tablet (50 mg total) by mouth every 12 (twelve) hours as needed. (Patient taking differently: Take 50 mg by mouth See admin instructions. Take 50mg  by mouth 1-2 times a day.) 60 tablet 2   warfarin (COUMADIN) 5 MG tablet TAKE AS DIRECTED BY COUMADIN CLINIC (Patient taking differently: Take 5 mg by mouth See admin instructions. Take 2.5 mg on Mon,Wed,Fri,Sat,and Sun. 5 mg on Tue and Thu.) 80 tablet 3   No current facility-administered medications on file prior to visit.    Past Medical History:  Diagnosis Date   Allergy    Arthritis    CAD (coronary artery disease) 1996   POST CABG   Cataracts, bilateral    Chicken pox    Dyslipidemia    Hyperlipidemia    Hypertension    Sinus bradycardia    Allergies  Allergen Reactions   Other     Environmental allergies   Imdur [Isosorbide Nitrate] Other (See Comments)    headache   Latex     Itchy hands    Social History   Socioeconomic History   Marital status: Married    Spouse [name]   Number of children: 2   Years of education: COLLEGE   Highest education level: Bachelor's degree (e.g., BA, AB, BS)  Occupational History   Occupation: RETIRED  Tobacco Use   Smoking status: Former    Current packs/day: 0.00    Types: Cigarettes    Quit date: 05/29/1982    Years since quitting: 41.3   Smokeless tobacco: Never  Vaping Use   Vaping status: Never Used  Substance and Sexual Activity   Alcohol use: Yes    Comment: 0-2 drinks a night   Drug use: No   Sexual activity: Not on file  Other Topics Concern   Not on file  Social History Narrative   Right handed   Caffeine   One story home lives with wife   retired   Chief Executive Officer  Determinants of Corporate investment banker Strain: Low Risk  (09/20/2023)   Overall Financial Resource Strain (CARDIA)    Difficulty of Paying Living Expenses: Not hard at all  Food Insecurity: No Food Insecurity (09/20/2023)  Hunger Vital Sign    Worried About Running Out of Food in the Last Year: Never true    Ran Out of Food in the Last Year: Never true  Transportation Needs: No Transportation Needs (09/20/2023)   PRAPARE - Administrator, Civil Service (Medical): No    Lack of Transportation (Non-Medical): No  Physical Activity: Inactive (09/20/2023)   Exercise Vital Sign    Days of Exercise per Week: 0 days    Minutes of Exercise per Session: 0 min  Stress: Patient Declined (09/20/2023)   [employer] of Occupational Health - Occupational Stress Questionnaire    Feeling of Stress : Patient declined  Social Connections: Moderately Isolated (09/20/2023)   Social Connection and Isolation Panel [NHANES]    Frequency of Communication with Friends and Family: Three times a week    Frequency of Social Gatherings with Friends and Family: Once a week    Attends Religious Services: Never    Database administrator or Organizations: No    Attends Banker Meetings: Never    Marital Status: Married    Vitals:   09/21/23 1029  BP: 110/70  Pulse: 76  Resp: 16  SpO2: 98%   Body mass index is 20.45 kg/m.  Physical Exam Vitals and nursing note reviewed.  Constitutional:      General: He is not in acute distress.    Appearance: He is well-developed.  HENT:     Head: Normocephalic and atraumatic.  Eyes:     Conjunctiva/sclera: Conjunctivae normal.  Cardiovascular:     Rate and Rhythm: Normal rate and regular rhythm.     Heart sounds: No murmur heard. Pulmonary:     Effort: Pulmonary effort is normal. No respiratory distress.     Breath sounds: Normal breath sounds.  Musculoskeletal:     Cervical back: No bony tenderness. Pain with movement  present. Decreased range of motion.     Thoracic back: Deformity (kyphosis.) present.     Right lower leg: Edema present.     Left lower leg: Edema present.     Comments: He cannot extend cervical spine or lift up his head.  Skin:    General: Skin is warm.     Findings: No erythema or rash.  Neurological:     Mental Status: He is alert. Mental status is at baseline.     Comments: In a wheel chair today.  Psychiatric:        Mood and Affect: Affect normal. Mood is anxious.   ASSESSMENT AND PLAN:  Mr. Nygard was seen today for ER follow up.   Other chest pain He has not had CP since ED visit. We discussed possible etiologies. He follows with cardiologist regularly, next appt 09/28/2023. Instructed about warning signs.  Generalized osteoarthritis of multiple sites We do not have many options for pain management due to his co morbilities and risk of med interaction. He could try Tramadol again but lower dose. He may tolerate better other opioid meds but they all have the potential of causing problem. He does not want to take Tramadol, again. His wife will let me know, if he decides to try a different medication.  A soft collar may help with cervical posture and therefore with pain, he has one at home.  Opioid-induced constipation He has not taken Movantik, constipation improved after stopping Tramadol.  Iron deficiency anemia, unspecified iron deficiency anemia type Slightly worse. We discussed possible causes. Colonoscopy 11/2022. I do not recommend further  work up at this time. On chronic anticoagulation.  Return if symptoms worsen or fail to improve, for keep next appointment.  I, Rolla Etienne Wierda, acting as a scribe for Ariah Mower [place], MD., have documented all relevant documentation on the behalf of Donaldson Richter [place], MD, as directed by  Eligh Rybacki [place], MD while in the presence of Marit Goodwill [place], MD.   I, Greggory Safranek [place], MD, have reviewed all documentation for this visit. The  documentation on 09/21/23 for the exam, diagnosis, procedures, and orders are all accurate and complete.  Torrie Lafavor G. [place], MD  [facility]. [place] office.

## 2023-09-21 NOTE — Patient Instructions (Signed)
A few things to remember from today's visit:  We could try a different med for pain but all opioids have the potential of causing constipation. Could also try 1/2 of the tramadol with the medication that gastroenterologist prescribed. A soft collar may help with neck pain and position.  If you need refills for medications you take chronically, please call your pharmacy. Do not use My Chart to request refills or for acute issues that need immediate attention. If you send a my chart message, it may take a few days to be addressed, specially if I am not in the office.  Please be sure medication list is accurate. If a new problem present, please set up appointment sooner than planned today.

## 2023-09-22 ENCOUNTER — Encounter (HOSPITAL_BASED_OUTPATIENT_CLINIC_OR_DEPARTMENT_OTHER): Payer: Medicare Other | Admitting: General Surgery

## 2023-09-25 ENCOUNTER — Other Ambulatory Visit: Payer: Self-pay

## 2023-09-26 MED ORDER — MEMANTINE HCL 10 MG PO TABS: 10.0000 mg | ORAL_TABLET | Freq: Two times a day (BID) | ORAL | 0 refills | Status: DC

## 2023-09-28 ENCOUNTER — Ambulatory Visit: Payer: Medicare Other | Attending: Cardiology

## 2023-09-28 ENCOUNTER — Encounter (HOSPITAL_BASED_OUTPATIENT_CLINIC_OR_DEPARTMENT_OTHER): Payer: Medicare Other | Admitting: General Surgery

## 2023-09-28 DIAGNOSIS — I890 Lymphedema, not elsewhere classified: Secondary | ICD-10-CM | POA: Diagnosis not present

## 2023-09-28 DIAGNOSIS — D6859 Other primary thrombophilia: Secondary | ICD-10-CM | POA: Insufficient documentation

## 2023-09-28 DIAGNOSIS — Z7901 Long term (current) use of anticoagulants: Secondary | ICD-10-CM | POA: Diagnosis not present

## 2023-09-28 DIAGNOSIS — I872 Venous insufficiency (chronic) (peripheral): Secondary | ICD-10-CM | POA: Diagnosis not present

## 2023-09-28 DIAGNOSIS — I8393 Asymptomatic varicose veins of bilateral lower extremities: Secondary | ICD-10-CM | POA: Diagnosis not present

## 2023-09-28 DIAGNOSIS — Z5181 Encounter for therapeutic drug level monitoring: Secondary | ICD-10-CM | POA: Insufficient documentation

## 2023-09-28 DIAGNOSIS — I10 Essential (primary) hypertension: Secondary | ICD-10-CM | POA: Diagnosis not present

## 2023-09-28 DIAGNOSIS — I2510 Atherosclerotic heart disease of native coronary artery without angina pectoris: Secondary | ICD-10-CM | POA: Diagnosis not present

## 2023-09-28 DIAGNOSIS — L97811 Non-pressure chronic ulcer of other part of right lower leg limited to breakdown of skin: Secondary | ICD-10-CM | POA: Diagnosis not present

## 2023-09-28 LAB — POCT INR: INR: 1.6 — AB (ref 2.0–3.0)

## 2023-09-28 NOTE — Patient Instructions (Signed)
Take 1 tablet today only then continue taking 1/2 tablet daily EXCEPT 1 tablet on Tuesdays and Thursdays.  Stay consistent with greens each week Recheck INR in 3 weeks.  [facility] [phone]

## 2023-09-28 NOTE — Progress Notes (Signed)
[name], [name] ([id number]) [id number] Page 1 of 7 Visit Report for 09/28/2023 Chief Complaint Document Details Patient Name: Date of Service: [name] 09/28/2023 10:45 A M Medical Record Number: [id number] Patient Account Number: [id number] Date of Birth/Sex: Treating RN: May 18, 1946 (77 y.o. M) Primary Care Provider: [place], Timothy Other Clinician: Referring Provider: Treating Provider/Extender: [name] Lloyd, Timothy Weeks in Treatment: 1 Information Obtained from: Patient Chief Complaint Patient presents to the wound care center due with non-wound condition(s) 09/17/2023: blister due to LE edema Electronic Signature(s) Signed: 09/28/2023 11:39:44 AM By: Timothy Guess MD FACS Entered By: [name] on 09/28/2023 08:39:44 -------------------------------------------------------------------------------- HPI Details Patient Name: Date of Service: [name] 09/28/2023 10:45 A M Medical Record Number: [id number] Patient Account Number: [id number] Date of Birth/Sex: Treating RN: 06-29-1946 (77 y.o. M) Primary Care Provider: [place], Timothy Other Clinician: Referring Provider: Treating Provider/Extender: [name] Lloyd, Timothy Weeks in Treatment: 1 History of Present Illness HPI Description: CONSULTATION ONLY 04/28/2023 This is a 77 year old man with a past medical history significant for coronary artery disease, hypertension, Alzheimer's disease, and a hypercoagulable state on chronic anticoagulation. He has had issues with bilateral lower extremity edema and is on Lasix. He had a left leg ulcer which his wife, who is a Engineer, civil (consulting), treated with silver alginate and a Tegaderm. This healed. He recently had a blister on his right lower extremity, but the time from his referral to actually getting his appointment with [place] has resulted in the wound healing. ADMISSION 09/17/2023 He returns to clinic today with a large  circular superficial ulcer on his right medial lower leg. This is a blister that ultimately broke open. He has not been wearing compression stockings as he says they are too hard to get on. 09/28/2023: As expected, his wound is healed. Electronic Signature(s) Signed: 09/28/2023 11:40:19 AM By: Timothy Guess MD FACS Entered By: [name] on 09/28/2023 08:40:19 [name] ([id number]) [id number] Page 2 of 7 -------------------------------------------------------------------------------- Physical Exam Details Patient Name: Date of Service: [name], [name] 09/28/2023 10:45 A M Medical Record Number: [id number] Patient Account Number: [id number] Date of Birth/Sex: Treating RN: 09/04/1946 (77 y.o. M) Primary Care Provider: [place], Timothy Other Clinician: Referring Provider: Treating Provider/Extender: [name] Lloyd, Timothy Weeks in Treatment: 1 Constitutional . Slightly bradycardic. . . no acute distress. Respiratory Normal work of breathing on room air. Notes 09/28/2023: His wound is healed. Electronic Signature(s) Signed: 09/28/2023 11:41:13 AM By: Timothy Guess MD FACS Entered By: [name] on 09/28/2023 08:41:13 -------------------------------------------------------------------------------- Physician Orders Details Patient Name: Date of Service: [name] 09/28/2023 10:45 A M Medical Record Number: [id number] Patient Account Number: [id number] Date of Birth/Sex: Treating RN: August 14, 1946 (77 y.o. 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 Services Discharge from Wound Care Center - Congratulations your wound is healed! Use JOBST 30000 FARROW WRAP when you get home!! Electronic Signature(s) Signed: 09/28/2023 11:46:57 AM By: Timothy Guess MD FACS Entered By: [name] on 09/28/2023 08:46:57 Problem List Details -------------------------------------------------------------------------------- [name] ([id number]) [id number] Page 3 of 7 Patient Name: Date of Service: [name], [name] 09/28/2023 10:45 A M  Medical Record Number: [id number] Patient Account Number: [id number] Date of Birth/Sex: Treating RN: 1946/11/28 (77 y.o. 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 M) Primary Care Provider: [place], Timothy Other Clinician: Referring Provider: Treating Provider/Extender: [name],  [name] Lloyd, Timothy Weeks in Treatment: 1 Subjective Chief Complaint Information obtained from Patient Patient presents to the wound care center due with non-wound condition(s) 09/17/2023: blister due to LE edema History of Present Illness (HPI) CONSULTATION ONLY 04/28/2023 This is a 77 year old man with a past medical history significant for coronary artery disease, hypertension, Alzheimer's disease, and a hypercoagulable state on chronic anticoagulation. He has had issues with bilateral lower extremity edema and is on Lasix. He had a left leg ulcer which his wife, who is a Engineer, civil (consulting), treated with silver alginate and a Tegaderm. This healed. He recently had a blister on his right lower extremity, but the time from his referral to actually getting his appointment with [place] has resulted in the wound healing. ADMISSION 09/17/2023 He returns to clinic today with a large circular superficial ulcer on his right medial lower leg. This is a blister that ultimately broke open. He has not been wearing compression stockings as he says they are too hard to get on. 09/28/2023: As expected, his wound is healed. [name], [name] ([id number]) [id number] Page 4 of 7 Patient History Information obtained from Patient, Chart. Family History Cancer, Lung Disease, No family history of Diabetes, Heart Disease, Hereditary Spherocytosis, Hypertension, Kidney Disease, Seizures, Stroke, Thyroid Problems, Tuberculosis. Social History Former smoker - quit at 37 yrs, Marital Status - Married, Alcohol Use - Daily - a glass a day, Drug Use - No History, Caffeine Use - Daily. Medical History Eyes Patient has history of Cataracts Cardiovascular Patient has history of Arrhythmia - bradycardia, Coronary Artery Disease, Hypertension, Myocardial Infarction, Peripheral Venous Disease Musculoskeletal Patient has history of Osteoarthritis Hospitalization/Surgery History - Colonoscopy with  propofol. - Polypectomy. - Inguinal hernia repair (Right). - Umbilical hernia repair. - Bypass graft. - Cataract extraction (Right). - Refractive surgery (Right). - skin biopsy. 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 no acute distress. Vitals Time Taken: 10:55 AM, Height: 73 in, Weight: 153 lbs, BMI: 20.2, Temperature: 97.6 F, Pulse: 57 bpm, Respiratory Rate: 18 breaths/min, Blood Pressure: 104/66 mmHg. Respiratory Normal work of breathing on room air. General Notes: 09/28/2023: His wound is healed. Integumentary (Hair, Skin) Wound #1 status is Healed - Epithelialized. Original cause of wound was Blister. The date acquired was: 08/27/2023. The wound has been in treatment 1 weeks. The wound is located on the Right,Medial Lower Leg. The wound measures 0cm length x 0cm width x 0cm  depth; 0cm^2 area and 0cm^3 volume. There is no tunneling or undermining noted. There is a none present amount of drainage noted. The wound margin is distinct with the outline attached to the wound base. There is no granulation within the wound bed. There is no necrotic tissue within the wound bed. The periwound skin appearance had no abnormalities noted for texture. The periwound skin appearance had no abnormalities noted for moisture. The periwound skin appearance had no abnormalities noted for color. Periwound temperature was noted as No Abnormality. 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 Use JOBST 30000 FARROW WRAP when you get home!! 09/28/2023: His wound is healed. He previously had not been wearing compression stockings due to difficulty getting them on. We were him Fabian November wraps so we can use these. He should wear compression garments on a daily basis and keep his legs elevated is much as possible during the day and at night while sleeping. We will discharge him from the wound care center. He may follow-up in the future if needed. Electronic Signature(s) Signed: 09/28/2023 11:52:41 AM By: Timothy Guess MD FACS Entered By: [name] on 09/28/2023 08:52:40 -------------------------------------------------------------------------------- HxROS Details Patient Name: Date of Service: [name] 09/28/2023 10:45 A M Medical Record Number: [id number] Patient Account Number: [id number] Date of Birth/Sex: Treating RN: [birth year] (77 y.o. M) Primary Care Provider: [place], Timothy Other Clinician: Referring Provider: Treating Provider/Extender: Parlee Amescua Lloyd, Timothy Weeks in Treatment: 1 Information Obtained From Patient Chart Eyes Medical History: Positive for: Cataracts Past Medical History Notes: hypertensive retinopathy Hematologic/Lymphatic Medical History: Past Medical History Notes: primary hypercoagulable state Cardiovascular Medical History: Positive for: Arrhythmia - bradycardia; Coronary Artery Disease; Hypertension; Myocardial Infarction; Peripheral Venous Disease Past Medical History Notes: varicose veins Genitourinary Medical History: Past Medical History Notes: urinary retention Musculoskeletal Medical History: Positive for: Osteoarthritis Neurologic Medical History: Past Medical History Notes: mild neurocognitive disorder and behavioral disturbance due  to Alzheimer's disease HBO Extended History Items Eyes: Cataracts Immunizations Pneumococcal Vaccine: NINO, STRUBE ([id number]) [id number] Page 6 of 7 Received Pneumococcal Vaccination: Yes Received Pneumococcal Vaccination On or After 60th Birthday: Yes Implantable Devices None Hospitalization / Surgery History Type of Hospitalization/Surgery Colonoscopy with propofol Polypectomy Inguinal hernia repair (Right) Umbilical hernia repair Bypass graft Cataract extraction (Right) Refractive surgery (Right) skin biopsy Family and Social History Cancer: Yes; Diabetes: No; Heart Disease: No; Hereditary Spherocytosis: No; Hypertension: No; Kidney Disease: No; Lung Disease: Yes; Seizures: No; Stroke: No; Thyroid Problems: No; Tuberculosis: No; Former smoker - quit at 37 yrs; Marital Status - Married; Alcohol Use: Daily - a glass a day; Drug Use: No History; Caffeine Use: Daily; Financial Concerns: No; Food, Clothing or Shelter Needs: No; Support System Lacking: No; Transportation Concerns: No Electronic Signature(s) Signed: 09/28/2023 2:09:05 PM By: Timothy Guess MD FACS Entered By: [name] on 09/28/2023 08:40:31 -------------------------------------------------------------------------------- SuperBill Details Patient Name: Date of Service: [name] 09/28/2023 Medical Record Number: [id number] Patient Account Number: [id number] Date of Birth/Sex: Treating RN: 03-Apr-1946 (77 y.o. 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 G30.1 Alzheimer's disease with late  onset Facility Procedures : CPT4 Code: [id number] Description: 99213 - WOUND CARE VISIT-LEV 3 EST PT Modifier: Quantity: 1 Physician Procedures : CPT4 Code Description Modifier [id number] 99213 - WC PHYS LEVEL 3 - EST PT ICD-10 Diagnosis Description L97.811 Non-pressure chronic ulcer of other part of right lower leg limited to breakdown of skin I87.2 Venous insufficiency (chronic) (peripheral)  I83.93 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

## 2023-09-29 NOTE — Progress Notes (Signed)
MICKENZIE, SCHNEIDER ([id number]) [id number] Page 1 of 8 Visit Report for 09/28/2023 Arrival Information Details Patient Name: Date of Service: [name], [name] 09/28/2023 10:45 A M Medical Record Number: [id number] Patient Account Number: [id number] Date of Birth/Sex: Treating RN: Mar 31, 1946 (77 y.o. Dianna Lloyd Primary Care Kehinde Bowdish: [place], Betty Other Clinician: Referring Gabreal Worton: Treating Avalyn Molino/Extender: Cannon, Jennifer [place], Betty Weeks in Treatment: 1 Visit Information History Since Last Visit Added or deleted any medications: No Patient Arrived: [place] Any new allergies or adverse reactions: No Arrival Time: 10:52 Had a fall or experienced change in No Accompanied By: friend activities of daily living that may affect Transfer Assistance: Manual risk of falls: Patient Identification Verified: Yes Signs or symptoms of abuse/neglect since last visito No Hospitalized since last visit: No Implantable device outside of the clinic excluding No cellular tissue based products placed in the center since last visit: Has Dressing in Place as Prescribed: Yes Pain Present Now: Yes Electronic Signature(s) Signed: 09/28/2023 6:28:47 PM By: Karie Schwalbe RN Entered By: Karie Schwalbe on 09/28/2023 10:53:25 -------------------------------------------------------------------------------- Clinic Level of Care Assessment Details Patient Name: Date of Service: Timothy, Lloyd 09/28/2023 10:45 A M Medical Record Number: [id number] Patient Account Number: [id number] Date of Birth/Sex: Treating RN: Mar 13, 1946 (77 y.o. Dianna Lloyd Primary Care Jhonatan Lomeli: [place], Betty Other Clinician: Referring Jennica Tagliaferri: Treating Brendaly Townsel/Extender: Cannon, Jennifer [place], Betty Weeks in Treatment: 1 Clinic Level of Care Assessment Items TOOL 4 Quantity Score X- 1 0 Use when only an EandM is performed on FOLLOW-UP visit ASSESSMENTS - Nursing  Assessment / Reassessment X- 1 10 Reassessment of Co-morbidities (includes updates in patient status) X- 1 5 Reassessment of Adherence to Treatment Plan ASSESSMENTS - Wound and Skin A ssessment / Reassessment X - Simple Wound Assessment / Reassessment - one wound 1 5 []  - 0 Complex Wound Assessment / Reassessment - multiple wounds []  - 0 Dermatologic / Skin Assessment (not related to wound area) ASSESSMENTS - Focused Assessment []  - 0 Circumferential Edema Measurements - multi extremities []  - 0 Nutritional Assessment / Counseling / Intervention AYVION, ISLER ([id number]) [id number] Page 2 of 8 []  - 0 Lower Extremity Assessment (monofilament, tuning fork, pulses) []  - 0 Peripheral Arterial Disease Assessment (using hand held doppler) ASSESSMENTS - Ostomy and/or Continence Assessment and Care []  - 0 Incontinence Assessment and Management []  - 0 Ostomy Care Assessment and Management (repouching, etc.) PROCESS - Coordination of Care X - Simple Patient / Family Education for ongoing care 1 15 []  - 0 Complex (extensive) Patient / Family Education for ongoing care X- 1 10 Staff obtains Chiropractor, Records, T Results / Process Orders est X- 1 10 Staff telephones HHA, Nursing Homes / Clarify orders / etc []  - 0 Routine Transfer to another Facility (non-emergent condition) []  - 0 Routine Hospital Admission (non-emergent condition) []  - 0 New Admissions / Manufacturing engineer / Ordering NPWT Apligraf, etc. , []  - 0 Emergency Hospital Admission (emergent condition) X- 1 10 Simple Discharge Coordination []  - 0 Complex (extensive) Discharge Coordination PROCESS - Special Needs []  - 0 Pediatric / Minor Patient Management []  - 0 Isolation Patient Management []  - 0 Hearing / Language / Visual special needs []  - 0 Assessment of Community assistance (transportation, D/C planning, etc.) []  - 0 Additional assistance / Altered mentation []  -  0 Support Surface(s) Assessment (bed, cushion, seat, etc.) INTERVENTIONS - Wound Cleansing / Measurement X - Simple Wound Cleansing - one wound 1 5 []  - 0 Complex Wound Cleansing - multiple  wounds X- 1 5 Wound Imaging (photographs - any number of wounds) []  - 0 Wound Tracing (instead of photographs) X- 1 5 Simple Wound Measurement - one wound []  - 0 Complex Wound Measurement - multiple wounds INTERVENTIONS - Wound Dressings X - Small Wound Dressing one or multiple wounds 1 10 []  - 0 Medium Wound Dressing one or multiple wounds []  - 0 Large Wound Dressing one or multiple wounds []  - 0 Application of Medications - topical []  - 0 Application of Medications - injection INTERVENTIONS - Miscellaneous []  - 0 External ear exam []  - 0 Specimen Collection (cultures, biopsies, blood, body fluids, etc.) []  - 0 Specimen(s) / Culture(s) sent or taken to Lab for analysis []  - 0 Patient Transfer (multiple staff / Nurse, adult / Similar devices) []  - 0 Simple Staple / Suture removal (25 or less) []  - 0 Complex Staple / Suture removal (26 or more) []  - 0 Hypo / Hyperglycemic Management (close monitor of Blood Glucose) Timothy, Lloyd ([id number]) [phone].pdf Page 3 of 8 []  - 0 Ankle / Brachial Index (ABI) - do not check if billed separately X- 1 5 Vital Signs Has the patient been seen at the hospital within the last three years: Yes Total Score: 95 Level Of Care: New/Established - Level 3 Electronic Signature(s) Signed: 09/28/2023 6:28:47 PM By: Karie Schwalbe RN Entered By: Karie Schwalbe on 09/28/2023 18:08:44 -------------------------------------------------------------------------------- Encounter Discharge Information Details Patient Name: Date of Service: [name] 09/28/2023 10:45 A M Medical Record Number: [id number] Patient Account Number: [id number] Date of Birth/Sex: Treating RN: 12-09-1945 (77 y.o. Dianna Lloyd Primary  Care Tevyn Codd: [place], Betty Other Clinician: Referring Leeba Barbe: Treating Brieanne Mignone/Extender: Cannon, Jennifer [place], Betty Weeks in Treatment: 1 Encounter Discharge Information Items Discharge Condition: Stable Ambulatory Status: Cane Discharge Destination: Home Transportation: Private Auto Accompanied By: daughter/caregiver Schedule Follow-up Appointment: Yes Clinical Summary of Care: Patient Declined Electronic Signature(s) Signed: 09/28/2023 6:28:47 PM By: Karie Schwalbe RN Entered By: Karie Schwalbe on 09/28/2023 18:09:35 -------------------------------------------------------------------------------- Lower Extremity Assessment Details Patient Name: Date of Service: MAKARIOS, VLIET 09/28/2023 10:45 A M Medical Record Number: [id number] Patient Account Number: [id number] Date of Birth/Sex: Treating RN: Feb 04, 1946 (77 y.o. Dianna Lloyd Primary Care Jovonta Levit: [place], Betty Other Clinician: Referring Marsheila Alejo: Treating Romanda Turrubiates/Extender: Cannon, Jennifer [place], Betty Weeks in Treatment: 1 Edema Assessment Assessed: [Left: No] [Right: No] [Left: Edema] [Right: :] Calf Left: Right: Point of Measurement: 37 cm From Medial Instep 33 cm Ankle Left: Right: Point of Measurement: 13 cm From Medial Instep 25 cm Knee To Floor Left: Right: JADEYN, REMEDIOS ([id number]) [phone].pdf Page 4 of 8 From Medial Instep 50 cm Vascular Assessment Pulses: Dorsalis Pedis Palpable: [Right:Yes] Extremity colors, hair growth, and conditions: Extremity Color: [Right:Normal] Hair Growth on Extremity: [Right:No] Temperature of Extremity: [Right:Warm] Capillary Refill: [Right:< 3 seconds] Dependent Rubor: [Right:No No] Toe Nail Assessment Left: Right: Thick: Yes Discolored: No Deformed: No Improper Length and Hygiene: No Electronic Signature(s) Signed: 09/28/2023 6:28:47 PM By: Karie Schwalbe RN Entered By: Karie Schwalbe on 09/28/2023  11:12:30 -------------------------------------------------------------------------------- Multi Wound Chart Details Patient Name: Date of Service: [name] 09/28/2023 10:45 A M Medical Record Number: [id number] Patient Account Number: [id number] Date of Birth/Sex: Treating RN: 1946-09-03 (77 y.o. M) Primary Care Lesia Monica: [place], Betty Other Clinician: Referring Stepanie Graver: Treating Takirah Binford/Extender: Cannon, Jennifer [place], Betty Weeks in Treatment: 1 Vital Signs Height(in): 73 Pulse(bpm): 57 Weight(lbs): 153 Blood Pressure(mmHg): 104/66 Body Mass Index(BMI): 20.2 Temperature(F): 97.6 Respiratory Rate(breaths/min): 18 [1:Photos:] [N/A:N/A] Right, Medial  Lower Leg N/A N/A Wound Location: Blister N/A N/A Wounding Event: Lymphedema N/A N/A Primary Etiology: Cataracts, Arrhythmia, Coronary N/A N/A Comorbid History: Artery Disease, Hypertension, Myocardial Infarction, Peripheral Venous Disease, Osteoarthritis 08/27/2023 N/A N/A Date Acquired: 1 N/A N/A Weeks of Treatment: Healed - Epithelialized N/A N/A Wound Status: No N/A N/A Wound Recurrence: 0x0x0 N/A N/A Measurements L x W x D (cm) 0 N/A N/A A (cm) : rea 0 N/A N/A Volume (cm) : [name] ([id number]) [id number] Page 5 of 8 100.00% N/A N/A % Reduction in Area: 100.00% N/A N/A % Reduction in Volume: Full Thickness Without Exposed N/A N/A Classification: Support Structures None Present N/A N/A Exudate Amount: Distinct, outline attached N/A N/A Wound Margin: None Present (0%) N/A N/A Granulation Amount: None Present (0%) N/A N/A Necrotic Amount: Fascia: No N/A N/A Exposed Structures: Fat Layer (Subcutaneous Tissue): No Tendon: No Muscle: No Joint: No Bone: No Large (67-100%) N/A N/A Epithelialization: No Abnormalities Noted N/A N/A Periwound Skin Texture: No Abnormalities Noted N/A N/A Periwound Skin Moisture: No Abnormalities Noted N/A  N/A Periwound Skin Color: No Abnormality N/A N/A Temperature: Treatment Notes Electronic Signature(s) Signed: 09/28/2023 11:38:17 AM By: Duanne Guess MD FACS Entered By: Duanne Guess on 09/28/2023 11:38:17 -------------------------------------------------------------------------------- Multi-Disciplinary Care Plan Details Patient Name: Date of Service: [name] 09/28/2023 10:45 A M Medical Record Number: [id number] Patient Account Number: [id number] Date of Birth/Sex: Treating RN: [birth year] (77 y.o. Dianna Lloyd Primary Care Jaiden Wahab: [place], Betty Other Clinician: Referring Maxwell Lemen: Treating Chino Sardo/Extender: Cannon, Jennifer [place], Betty Weeks in Treatment: 1 Active Inactive Electronic Signature(s) Signed: 09/28/2023 6:28:47 PM By: Karie Schwalbe RN Entered By: Karie Schwalbe on 09/28/2023 18:07:39 -------------------------------------------------------------------------------- Pain Assessment Details Patient Name: Date of Service: [name] 09/28/2023 10:45 A M Medical Record Number: [id number] Patient Account Number: [id number] Date of Birth/Sex: Treating RN: 1946/01/17 (77 y.o. Dianna Lloyd Primary Care Alfonse Garringer: [place], Betty Other Clinician: Referring Naliya Gish: Treating Lavert Matousek/Extender: Cannon, Jennifer [place], Betty Weeks in Treatment: 1 Active Problems Location of Pain Severity and Description of Pain Patient Has Paino Yes Site Locations Pain Location: GLYNDON, RAILE ([id number]) [id number] Page 6 of 8 Pain Location: Generalized Pain With Dressing Change: No Duration of the Pain. Constant / Intermittento Constant Rate the pain. Current Pain Level: 7 Character of Pain Describe the Pain: Difficult to Pinpoint Pain Management and Medication Current Pain Management: Medication: Yes Cold Application: No Rest: Yes Massage: No Activity: No T.E.N.S.: No Heat Application: No Leg  drop or elevation: No Is the Current Pain Management Adequate: Adequate How does your wound impact your activities of daily livingo Sleep: No Bathing: No Appetite: No Relationship With Others: No Bladder Continence: No Emotions: No Bowel Continence: No Work: No Toileting: No Drive: No Dressing: No Hobbies: No Electronic Signature(s) Signed: 09/28/2023 6:28:47 PM By: Karie Schwalbe RN Entered By: Karie Schwalbe on 09/28/2023 11:10:57 -------------------------------------------------------------------------------- Patient/Caregiver Education Details Patient Name: Date of Service: [name] 10/21/2024andnbsp10:45 A M Medical Record Number: [id number] Patient Account Number: [id number] Date of Birth/Gender: Treating RN: [birth year] (77 y.o. Dianna Lloyd Primary Care Physician: [place], Betty Other Clinician: Referring Physician: Treating Physician/Extender: Cannon, Jennifer [place], Betty Weeks in Treatment: 1 Education Assessment Education Provided To: Patient Education Topics Provided Wound/Skin Impairment: Methods: Explain/Verbal Responses: State content correctly Electronic Signature(s) Signed: 09/28/2023 6:28:47 PM By: Karie Schwalbe RN Claude Manges, Rolly Salter ([id number]) [id number] Page 7 of 8 Entered By: Karie Schwalbe on 09/28/2023 18:07:51 -------------------------------------------------------------------------------- Wound Assessment Details Patient Name: Date of Service: ROHAN, BRIXEY  J. 09/28/2023 10:45 A M Medical Record Number: [id number] Patient Account Number: [id number] Date of Birth/Sex: Treating RN: Nov 11, 1946 (77 y.o. Dianna Lloyd Primary Care Dominion Kathan: [place], Betty Other Clinician: Referring Kaushal Vannice: Treating Jaivian Battaglini/Extender: Cannon, Jennifer [place], Betty Weeks in Treatment: 1 Wound Status Wound Number: 1 Primary Lymphedema Etiology: Wound Location: Right, Medial Lower Leg Wound Healed -  Epithelialized Wounding Event: Blister Status: Date Acquired: 08/27/2023 Comorbid Cataracts, Arrhythmia, Coronary Artery Disease, Hypertension, Weeks Of Treatment: 1 History: Myocardial Infarction, Peripheral Venous Disease, Osteoarthritis Clustered Wound: No Photos Wound Measurements Length: (cm) Width: (cm) Depth: (cm) Area: (cm) Volume: (cm) 0 % Reduction in Area: 100% 0 % Reduction in Volume: 100% 0 Epithelialization: Large (67-100%) 0 Tunneling: No 0 Undermining: No Wound Description Classification: Full Thickness Without Exposed Support Structures Wound Margin: Distinct, outline attached Exudate Amount: None Present Foul Odor After Cleansing: No Slough/Fibrino No Wound Bed Granulation Amount: None Present (0%) Exposed Structure Necrotic Amount: None Present (0%) Fascia Exposed: No Fat Layer (Subcutaneous Tissue) Exposed: No Tendon Exposed: No Muscle Exposed: No Joint Exposed: No Bone Exposed: No Periwound Skin Texture Texture Color No Abnormalities Noted: Yes No Abnormalities Noted: Yes Moisture Temperature / Pain No Abnormalities Noted: Yes Temperature: No Abnormality Electronic Signature(s) Signed: 09/28/2023 6:28:47 PM By: Karie Schwalbe RN Entered By: Karie Schwalbe on 09/28/2023 11:25:40 [name] ([id number]) [id number] Page 8 of 8 -------------------------------------------------------------------------------- Vitals Details Patient Name: Date of Service: ZAIYDEN, BRUMBELOW 09/28/2023 10:45 A M Medical Record Number: [id number] Patient Account Number: [id number] Date of Birth/Sex: Treating RN: [birth year] (77 y.o. Dianna Lloyd Primary Care Ahlijah Raia: [place], Betty Other Clinician: Referring Burnette Valenti: Treating Sherald Balbuena/Extender: Cannon, Jennifer [place], Betty Weeks in Treatment: 1 Vital Signs Time Taken: 10:55 Temperature (F): 97.6 Height (in): 73 Pulse (bpm): 57 Weight (lbs): 153 Respiratory Rate  (breaths/min): 18 Body Mass Index (BMI): 20.2 Blood Pressure (mmHg): 104/66 Reference Range: 80 - 120 mg / dl Electronic Signature(s) Signed: 09/28/2023 6:28:47 PM By: Karie Schwalbe RN Entered By: Karie Schwalbe on 09/28/2023 11:09:48

## 2023-10-05 DIAGNOSIS — S0502XA Injury of conjunctiva and corneal abrasion without foreign body, left eye, initial encounter: Secondary | ICD-10-CM | POA: Diagnosis not present

## 2023-10-09 DIAGNOSIS — S0502XD Injury of conjunctiva and corneal abrasion without foreign body, left eye, subsequent encounter: Secondary | ICD-10-CM | POA: Diagnosis not present

## 2023-10-15 ENCOUNTER — Telehealth: Payer: Self-pay

## 2023-10-15 ENCOUNTER — Telehealth: Payer: Self-pay | Admitting: Interventional Cardiology

## 2023-10-15 NOTE — Telephone Encounter (Signed)
Transition Care Management Unsuccessful Follow-up Telephone Call  Date of discharge and from where:  09/15/2023 [facility]  Attempts:  1st Attempt  Reason for unsuccessful TCM follow-up call:  No answer/busy  Jordayn Mink [facility]  [facility], [facility] Resource Care Guide Direct Dial: [phone]  Website: [email or web address]

## 2023-10-15 NOTE — Telephone Encounter (Signed)
Spoke with Lupita Leash, patient's wife, [place] per DPR.  She reports patient had gone to ED for chest pain (09/15/23). His HR was low in the 50's there as well.   Lupita Leash reports patient continues to have intermittent chest pain, the last one being this morning. She notes he had not had his Ranexa in over 12 hours at that point. Lupita Leash reports patient is currently not in any acute distress. 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  Lupita Leash states patient has been sleepy. He slept 13 hours last night. She reports BP this morning 134/69, HR 52. Denies any apparent SOB. She states he has had 2 falls in the past week, but unable to know whether or not he has had any dizziness/lightheadedness.   Hgb in hospital 10.7 on 10/8, previously 12.6 8/7. Encouraged Lupita Leash to have patient follow-up with PCP for evaluation and F/U on Hgb level. Advised on ED precautions for CP/SOB. If further evaluation is needed or recommended after PCP visit call our office to schedule an appt. Lupita Leash verbalized understanding and agrees with plan.

## 2023-10-15 NOTE — Telephone Encounter (Signed)
Wife is calling to say the patient pulse is down to 52 and giving his decrease helium globin. Should she take him to the hospital or does he need an adjustment on his cardiac meds. Please advise

## 2023-10-16 ENCOUNTER — Telehealth: Payer: Self-pay

## 2023-10-16 NOTE — Telephone Encounter (Signed)
Transition Care Management Unsuccessful Follow-up Telephone Call  Date of discharge and from where:  09/15/2023 [facility]  Attempts:  2nd Attempt  Reason for unsuccessful TCM follow-up call:  Unable to leave message voicemail does not pickpup.  Ilia Dimaano [facility]  [facility] Guide Direct Dial: [phone]  Website: [email or web address]

## 2023-10-19 ENCOUNTER — Ambulatory Visit: Payer: Medicare Other

## 2023-10-19 ENCOUNTER — Telehealth: Payer: Self-pay | Admitting: *Deleted

## 2023-10-19 NOTE — Telephone Encounter (Signed)
Called pt since he missed this mornings Anticoagulation Appt; was able to speak with wife & reschedule appt to later this week. She was thankful for the call.

## 2023-10-20 ENCOUNTER — Other Ambulatory Visit: Payer: Self-pay

## 2023-10-20 MED ORDER — FUROSEMIDE 40 MG PO TABS: 40.0000 mg | ORAL_TABLET | ORAL | 3 refills | Status: DC

## 2023-10-20 MED ORDER — RANOLAZINE ER 500 MG PO TB12: 500.0000 mg | ORAL_TABLET | Freq: Two times a day (BID) | ORAL | 0 refills | Status: DC

## 2023-10-21 ENCOUNTER — Encounter: Payer: Self-pay | Admitting: Family Medicine

## 2023-10-21 ENCOUNTER — Ambulatory Visit: Payer: Medicare Other | Attending: Cardiology

## 2023-10-21 ENCOUNTER — Ambulatory Visit (INDEPENDENT_AMBULATORY_CARE_PROVIDER_SITE_OTHER): Payer: Medicare Other | Admitting: Family Medicine

## 2023-10-21 VITALS — BP 120/60 | HR 60 | Resp 16 | Ht 73.0 in

## 2023-10-21 DIAGNOSIS — G301 Alzheimer's disease with late onset: Secondary | ICD-10-CM

## 2023-10-21 DIAGNOSIS — T402X5A Adverse effect of other opioids, initial encounter: Secondary | ICD-10-CM | POA: Insufficient documentation

## 2023-10-21 DIAGNOSIS — D6859 Other primary thrombophilia: Secondary | ICD-10-CM | POA: Insufficient documentation

## 2023-10-21 DIAGNOSIS — Z5181 Encounter for therapeutic drug level monitoring: Secondary | ICD-10-CM | POA: Insufficient documentation

## 2023-10-21 DIAGNOSIS — F02818 Dementia in other diseases classified elsewhere, unspecified severity, with other behavioral disturbance: Secondary | ICD-10-CM | POA: Diagnosis not present

## 2023-10-21 DIAGNOSIS — M159 Polyosteoarthritis, unspecified: Secondary | ICD-10-CM

## 2023-10-21 DIAGNOSIS — R5381 Other malaise: Secondary | ICD-10-CM

## 2023-10-21 DIAGNOSIS — R258 Other abnormal involuntary movements: Secondary | ICD-10-CM | POA: Insufficient documentation

## 2023-10-21 DIAGNOSIS — D649 Anemia, unspecified: Secondary | ICD-10-CM | POA: Diagnosis not present

## 2023-10-21 DIAGNOSIS — K5903 Drug induced constipation: Secondary | ICD-10-CM

## 2023-10-21 DIAGNOSIS — R296 Repeated falls: Secondary | ICD-10-CM | POA: Insufficient documentation

## 2023-10-21 LAB — POCT INR: INR: 2.4 (ref 2.0–3.0)

## 2023-10-21 MED ORDER — LORAZEPAM 0.5 MG PO TABS
0.2500 mg | ORAL_TABLET | Freq: Two times a day (BID) | ORAL | 0 refills | Status: DC | PRN
Start: 2023-10-21 — End: 2024-06-22

## 2023-10-21 NOTE — Assessment & Plan Note (Signed)
We discussed general side effects of opioid medications. Because severe pain is affecting his quality of life, we decided to continue tramadol. Recommend taking MiraLAX and Movantik 25 mg daily. Adequate hydration and fiber intake. Instructed about warning signs.

## 2023-10-21 NOTE — Assessment & Plan Note (Signed)
Decreasing Namenda as recommended by his neurologist did not help. Lorazepam 0.5 mg 1/2 tablet twice daily as needed may help.

## 2023-10-21 NOTE — Patient Instructions (Signed)
Description   Continue taking 1/2 tablet daily EXCEPT 1 tablet on Tuesdays and Thursdays.  Stay consistent with greens each week Recheck INR in 4 weeks.  [facility] [phone]

## 2023-10-21 NOTE — Assessment & Plan Note (Signed)
Specially lower and upper back with significant limitation of range of motion. Today pain is severe, has not taken Tramadol in 2 days and decided to take one now. Due to cervical spine limitation of motion he could not drink from bottle. We reviewed some side effects of tramadol, based on risk vs benefits, we decided to continue but lower done at the time, 25 mg (1/2 tab) tid with acetaminophen 500 mg. Follow-up in 2 months.

## 2023-10-21 NOTE — Progress Notes (Addendum)
ACUTE VISIT Chief Complaint  Patient presents with   Medical Management of Chronic Issues   HPI: [name] is a 77 y.o. male with a PMHx significant for CAD, HTN, Alzheimer's disease, HLD, and chronic pain here today with his wife , who is concerned about anemia and progressive weakness.   Last seen on 09/21/2023.    Patient's wife states he has had 3 falls in the last 10 days. They did not go to the ER for any of the falls, but his wife says she needed to call 911 to have help getting him up after one of the falls.  She says that he is significantly weaker, specially LE's, no focal neurologic deficit. He could not stand without assistance on 11/10.  Unstable gait. She says he uses both a walker and a cane occasionally in the house but has forgotten sometimes, like last night when she was walking with no assistance and fell again. No serious injury..  She also mentions he is eating less and losing weight.   She is also concerned about worsening anemia. Occasional small amount of blood in the urine.  He has followed with urology since his last visit and underwent cystoscopy.  According to wife, a small polyp was seen, deemed to be benign, no further workup was recommended. Negative for nosebleed, gum bleeding, abdominal pain,melena, or blood in the stool. He is on chronic anticoagulation, Coumadin.  Last INR today was 2.4.    Latest Ref Rng & Units 09/15/2023   11:42 AM 07/15/2023    7:23 AM 07/14/2023   12:46 PM  CBC  WBC 4.0 - 10.5 K/uL 2.5  4.1  3.5   Hemoglobin 13.0 - 17.0 g/dL 16.1  09.6  04.5   Hematocrit 39.0 - 52.0 % 32.4  37.9  36.2   Platelets 150 - 400 K/uL 227  200  222     Lab Results  Component Value Date   NA 146 (H) 09/15/2023   CL 105 09/15/2023   K 3.5 09/15/2023   CO2 27 09/15/2023   BUN 20 09/15/2023   CREATININE 1.15 09/15/2023   GFRNONAA >60 09/15/2023   CALCIUM 10.2 09/15/2023   ALBUMIN 2.6 (L) 09/15/2023   GLUCOSE 100 (H) 09/15/2023    Chronic pain, especially upper and lower back pain. Currently she is on tramadol, which has helped with pain in the past but he has refused to take consistently afraid of having constipation/fecal impaction again. He has not taken any tramadol for the last 2 days.   His wife says he is taking Miralax daily as needed and Movantik 25 mg daily if he takes tramadol.  He has bowel movements about every other day. Alzheimer's disease: Problem is gradually getting worse, sometimes she does not recognize his wife.  Hallucinations have become more frequent and vivid, mostly occur at night. He is on Namenda 10 mg twice daily. Now he needs assistance with some ADLs: Toilet, dressing, grooming, and transfer.  Lower extremity venous ulcer, he is no longer following at the wound clinic, last visit 09/28/23.   Lower extremity movement disorder is a still present, sudden onset, it last a few minutes, not sure about exacerbating or elevating factors.  His wife sent a video to neuro at [facility], who did not recommend adding medication due to risk of side effects and medication interaction.  Decreasing dose of Namenda did not help.  Review of Systems  Constitutional:  Positive for activity change, appetite change and fatigue. Negative  for chills and fever.  HENT:  Negative for mouth sores and sore throat.   Respiratory:  Negative for cough, shortness of breath and wheezing.   Cardiovascular:  Positive for leg swelling (Chronic.). Negative for chest pain.  Endocrine: Negative for cold intolerance and heat intolerance.  Genitourinary:  Negative for decreased urine volume and dysuria.  Musculoskeletal:  Positive for arthralgias, back pain and gait problem.  Neurological:  Negative for syncope and facial asymmetry.  See other pertinent positives and negatives in HPI.  Current Outpatient Medications on File Prior to Visit  Medication Sig Dispense Refill   amLODipine (NORVASC) 5 MG tablet TAKE ONE AND ONE-HALF  TABLETS DAILY (Patient taking differently: Take 5 mg by mouth daily.) 135 tablet 3   cholecalciferol (VITAMIN D) 1000 units tablet Take 1,000 Units by mouth daily.      Cyanocobalamin (VITAMIN B12) 500 MCG TABS Take 500 mcg by mouth daily.      ezetimibe (ZETIA) 10 MG tablet TAKE 1 TABLET AT BEDTIME 90 tablet 3   fenofibrate (TRICOR) 145 MG tablet TAKE 1 TABLET AT BEDTIME 90 tablet 3   finasteride (PROSCAR) 5 MG tablet Take 5 mg by mouth daily.     furosemide (LASIX) 40 MG tablet Take 1 tablet (40 mg total) by mouth every other day. 90 tablet 3   memantine (NAMENDA) 10 MG tablet Take 1 tablet (10 mg total) by mouth 2 (two) times daily. 180 tablet 0   naloxegol oxalate (MOVANTIK) 25 MG TABS tablet Take 1 tablet (25 mg total) by mouth daily. 30 tablet 1   nitroGLYCERIN (NITROSTAT) 0.4 MG SL tablet Place 1 tablet (0.4 mg total) under the tongue every 5 (five) minutes as needed for chest pain. 75 tablet 2   ranolazine (RANEXA) 500 MG 12 hr tablet Take 1 tablet (500 mg total) by mouth 2 (two) times daily. 60 tablet 0   rosuvastatin (CRESTOR) 40 MG tablet TAKE 1 TABLET DAILY 90 tablet 3   tamsulosin (FLOMAX) 0.4 MG CAPS capsule Take 0.4 mg by mouth daily after supper.     traMADol (ULTRAM) 50 MG tablet Take 1 tablet (50 mg total) by mouth every 12 (twelve) hours as needed. (Patient taking differently: Take 50 mg by mouth See admin instructions. Take 50mg  by mouth 1-2 times a day.) 60 tablet 2   warfarin (COUMADIN) 5 MG tablet TAKE AS DIRECTED BY COUMADIN CLINIC (Patient taking differently: Take 5 mg by mouth See admin instructions. Take 2.5 mg on Mon,Wed,Fri,Sat,and Sun. 5 mg on Tue and Thu.) 80 tablet 3   losartan (COZAAR) 50 MG tablet Take 1 tablet (50 mg total) by mouth daily. 30 tablet 3   No current facility-administered medications on file prior to visit.    Past Medical History:  Diagnosis Date   Allergy    Arthritis    CAD (coronary artery disease) 1996   POST CABG   Cataracts, bilateral     Chicken pox    Dyslipidemia    Hyperlipidemia    Hypertension    Sinus bradycardia    Allergies  Allergen Reactions   Other     Environmental allergies   Imdur [Isosorbide Nitrate] Other (See Comments)    headache   Latex     Itchy hands    Social History   Socioeconomic History   Marital status: Married    Spouse name: [name]   Number of children: 2   Years of education: COLLEGE   Highest education level: Bachelor's degree (e.g., BA,  AB, BS)  Occupational History   Occupation: RETIRED  Tobacco Use   Smoking status: Former    Current packs/day: 0.00    Types: Cigarettes    Quit date: 05/29/1982    Years since quitting: 41.4   Smokeless tobacco: Never  Vaping Use   Vaping status: Never Used  Substance and Sexual Activity   Alcohol use: Yes    Comment: 0-2 drinks a night   Drug use: No   Sexual activity: Not on file  Other Topics Concern   Not on file  Social History Narrative   Right handed   Caffeine   One story home lives with wife   retired   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 Strain: Low Risk  (09/20/2023)   Overall Financial Resource Strain (CARDIA)    Difficulty of Paying Living Expenses: Not hard at all  Food Insecurity: No Food Insecurity (09/20/2023)   Hunger Vital Sign    Worried About Running Out of Food in the Last Year: Never true    Ran Out of Food in the Last Year: Never true  Transportation Needs: No Transportation Needs (09/20/2023)   PRAPARE - Administrator, Civil Service (Medical): No    Lack of Transportation (Non-Medical): No  Physical Activity: Inactive (09/20/2023)   Exercise Vital Sign    Days of Exercise per Week: 0 days    Minutes of Exercise per Session: 0 min  Stress: Patient Declined (09/20/2023)   [employer] of Occupational Health - Occupational Stress Questionnaire    Feeling of Stress : Patient declined  Social Connections: Moderately Isolated (09/20/2023)   Social Connection and  Isolation Panel [NHANES]    Frequency of Communication with Friends and Family: Three times a week    Frequency of Social Gatherings with Friends and Family: Once a week    Attends Religious Services: Never    Database administrator or Organizations: No    Attends Banker Meetings: Never    Marital Status: Married   Vitals:   10/21/23 1420  BP: 120/60  Pulse: 60  Resp: 16  SpO2: 98%   Wt Readings from Last 3 Encounters:  09/15/23 155 lb (70.3 kg)  09/03/23 155 lb (70.3 kg)  08/19/23 158 lb (71.7 kg)   Body mass index is 20.45 kg/m.  Physical Exam Vitals and nursing note reviewed.  Constitutional:      General: He is not in acute distress.    Appearance: He is well-developed.  HENT:     Head: Normocephalic and atraumatic.  Eyes:     Conjunctiva/sclera: Conjunctivae normal.  Cardiovascular:     Rate and Rhythm: Normal rate and regular rhythm.     Heart sounds: No murmur heard. Pulmonary:     Effort: Pulmonary effort is normal. No respiratory distress.     Breath sounds: Normal breath sounds.  Abdominal:     Palpations: Abdomen is soft.     Tenderness: There is no abdominal tenderness.  Musculoskeletal:     Cervical back: Decreased range of motion.     Thoracic back: Deformity (Marked kyphosis.) present.     Comments: He cannot extend cervical spine or lift up his head, trying to do so causes pain.  Skin:    General: Skin is warm.     Findings: No erythema or rash.  Neurological:     Mental Status: He is alert. Mental status is at baseline.     Comments: Unstable gait, using a  wheel chair today. Needs assistance to stand up from seating position. He has a cane with him today.  Psychiatric:        Mood and Affect: Affect normal. Mood is anxious.   ASSESSMENT AND PLAN:  Mr. Loughner was seen today for bradycardia, low HGB, and weakness.  Lab Results  Component Value Date   NA 143 10/21/2023   CL 106 10/21/2023   K 4.0 10/21/2023   CO2 30 10/21/2023    BUN 34 (H) 10/21/2023   CREATININE 1.70 (H) 10/21/2023   GFR 38.82 (L) 10/21/2023   CALCIUM 11.0 (H) 10/21/2023   ALBUMIN 2.6 (L) 09/15/2023   GLUCOSE 122 (H) 10/21/2023   Lab Results  Component Value Date   WBC 3.4 (L) 10/21/2023   HGB 12.2 (L) 10/21/2023   HCT 36.2 (L) 10/21/2023   MCV 96.1 10/21/2023   PLT 192.0 10/21/2023   Opioid-induced constipation Assessment & Plan: We discussed general side effects of opioid medications. Because severe pain is affecting his quality of life, we decided to continue tramadol. Recommend taking MiraLAX and Movantik 25 mg daily. Adequate hydration and fiber intake. Instructed about warning signs.   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 Currently he is on Namenda 10 mg twice daily and Aricept, which dose was recently decreased from 10 mg to 5 mg daily. He follows with neurology regularly. After discussion of side effects, which include agitation and worsening confusion, he is wife agrees with trying Lorazepam 0.5 1/2 tab bid prn. His wife agrees with palliative care evaluation.  Orders: -     LORazepam; Take 0.5 tablets (0.25 mg total) by mouth 2 (two) times daily as needed for anxiety.  Dispense: 30 tablet; Refill: 0 -     Ambulatory referral to Home Health  Generalized osteoarthritis of multiple sites Assessment & Plan: Specially lower and upper back with significant limitation of range of motion. Today pain is severe, has not taken Tramadol in 2 days and decided to take one now. Due to cervical spine limitation of motion he could not drink from bottle. We reviewed some side effects of tramadol, based on risk vs benefits, we decided to continue but lower done at the time, 25 mg (1/2 tab) tid with acetaminophen 500 mg. Follow-up in 2 months.  Orders: -     Ambulatory referral to Home  Health  Declining functional status -     Ambulatory referral to Home Health  Normocytic anemia Assessment & Plan: We discussed possible etiologies. Mild. ?  Anemia of chronic disease. He is not interested in further workup, including GI. Colonoscopy done in 11/2022. His wife would like a CBC done today to be sure problem is stable.  Orders: -     Basic metabolic panel; Future -     CBC; Future  Other abnormal involuntary movements Assessment & Plan: Decreasing Namenda as recommended by his neurologist did not help. Lorazepam 0.5 mg 1/2 tablet twice daily as needed may help.  Orders: -     LORazepam; Take 0.5 tablets (0.25 mg total) by mouth 2 (two) times daily as needed for anxiety.  Dispense: 30 tablet; Refill: 0  Frequent falls Assessment & Plan: Some of his comorbidities as well as medication can be contributing factors. Sometimes he forgets to use his walker. We discussed fall precautions. I do not think he will benefit from PT at this time, he  and his wife agree with holding on this for now.   I spent a total of 48 minutes in both face to face and non face to face activities for this visit on the date of this encounter. During this time history was obtained and documented, examination was performed, prior labs reviewed, and assessment/plan discussed. His wife agrees with palliative care evaluation.  Return in about 2 months (around 12/21/2023).  I, Rolla Etienne Wierda, acting as a scribe for Damari Suastegui [place], MD., have documented all relevant documentation on the behalf of Henley Blyth [place], MD, as directed by  Mazelle Huebert [place], MD while in the presence of Remedy Corporan [place], MD.   I, Melodye Swor [place], MD, have reviewed all documentation for this visit. The documentation on 10/21/23 for the exam, diagnosis, procedures, and orders are all accurate and complete.  Ezrah Dembeck G. [place], MD  [facility]. [place] office.

## 2023-10-21 NOTE — Assessment & Plan Note (Signed)
Problem is gradually getting worse, having hallucination and not recognizing his wife at times. Currently he is on Namenda 10 mg twice daily and Aricept, which dose was recently decreased from 10 mg to 5 mg daily. He follows with neurology regularly. After discussion of side effects, which include agitation and worsening confusion, he is wife agrees with trying Lorazepam 0.5 1/2 tab bid prn. His wife agrees with palliative care evaluation.

## 2023-10-21 NOTE — Assessment & Plan Note (Addendum)
We discussed possible etiologies. Mild. ?  Anemia of chronic disease. He is not interested in further workup, including GI. Colonoscopy done in 11/2022. His wife would like a CBC done today to be sure problem is stable.

## 2023-10-21 NOTE — Assessment & Plan Note (Signed)
Some of his comorbidities as well as medication can be contributing factors. Sometimes he forgets to use his walker. We discussed fall precautions. I do not think he will benefit from PT at this time, he and his wife agree with holding on this for now.

## 2023-10-21 NOTE — Patient Instructions (Signed)
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 Tramadol 50 mg 1/2 tab 3 times daily with Tylenol 500 mg. Miralax daily.  If you need refills for medications you take chronically, please call your pharmacy. Do not use My Chart to request refills or for acute issues that need immediate attention. If you send a my chart message, it may take a few days to be addressed, specially if I am not in the office.  Please be sure medication list is accurate. If a new problem present, please set up appointment sooner than planned today.

## 2023-10-22 LAB — BASIC METABOLIC PANEL
BUN: 34 mg/dL — ABNORMAL HIGH (ref 6–23)
CO2: 30 meq/L (ref 19–32)
Calcium: 11 mg/dL — ABNORMAL HIGH (ref 8.4–10.5)
Chloride: 106 meq/L (ref 96–112)
Creatinine, Ser: 1.7 mg/dL — ABNORMAL HIGH (ref 0.40–1.50)
GFR: 38.82 mL/min — ABNORMAL LOW (ref >60.00)
Glucose, Bld: 122 mg/dL — ABNORMAL HIGH (ref 70–99)
Potassium: 4 meq/L (ref 3.5–5.1)
Sodium: 143 meq/L (ref 135–145)

## 2023-10-22 LAB — CBC
HCT: 36.2 % — ABNORMAL LOW (ref 39.0–52.0)
Hemoglobin: 12.2 g/dL — ABNORMAL LOW (ref 13.0–17.0)
MCHC: 33.8 g/dL (ref 30.0–36.0)
MCV: 96.1 fL (ref 78.0–100.0)
Platelets: 192 10*3/uL (ref 150.0–400.0)
RBC: 3.77 Mil/uL — ABNORMAL LOW (ref 4.22–5.81)
RDW: 15.2 % (ref 11.5–15.5)
WBC: 3.4 10*3/uL — ABNORMAL LOW (ref 4.0–10.5)

## 2023-10-23 ENCOUNTER — Telehealth: Payer: Self-pay | Admitting: Family Medicine

## 2023-10-23 NOTE — Telephone Encounter (Signed)
Submitted via covermymeds. [id number]

## 2023-10-23 NOTE — Telephone Encounter (Signed)
Pharmacy requesting PA for LORazepam (ATIVAN) 0.5 MG tablet for auth call [phone]

## 2023-10-23 NOTE — Telephone Encounter (Signed)
I spoke with patient's wife. She is aware we are waiting on the insurance to make a decision.

## 2023-10-26 NOTE — Telephone Encounter (Signed)
PA approved.

## 2023-11-02 ENCOUNTER — Other Ambulatory Visit: Payer: Self-pay

## 2023-11-02 MED ORDER — LOSARTAN POTASSIUM 50 MG PO TABS: 50.0000 mg | ORAL_TABLET | Freq: Every day | ORAL | 2 refills | Status: DC

## 2023-11-14 ENCOUNTER — Emergency Department (HOSPITAL_COMMUNITY): Payer: Medicare Other

## 2023-11-14 ENCOUNTER — Emergency Department (HOSPITAL_COMMUNITY)
Admission: EM | Admit: 2023-11-14 | Discharge: 2023-11-14 | Disposition: A | Discharge status: Home / Self Care | Payer: Medicare Other | Attending: Emergency Medicine | Admitting: Emergency Medicine

## 2023-11-14 ENCOUNTER — Encounter (HOSPITAL_COMMUNITY): Payer: Self-pay

## 2023-11-14 ENCOUNTER — Other Ambulatory Visit: Payer: Self-pay

## 2023-11-14 DIAGNOSIS — Y92009 Unspecified place in unspecified non-institutional (private) residence as the place of occurrence of the external cause: Secondary | ICD-10-CM | POA: Insufficient documentation

## 2023-11-14 DIAGNOSIS — W19XXXA Unspecified fall, initial encounter: Secondary | ICD-10-CM | POA: Insufficient documentation

## 2023-11-14 DIAGNOSIS — I2510 Atherosclerotic heart disease of native coronary artery without angina pectoris: Secondary | ICD-10-CM | POA: Diagnosis not present

## 2023-11-14 DIAGNOSIS — I10 Essential (primary) hypertension: Secondary | ICD-10-CM | POA: Insufficient documentation

## 2023-11-14 DIAGNOSIS — M25551 Pain in right hip: Secondary | ICD-10-CM | POA: Diagnosis not present

## 2023-11-14 DIAGNOSIS — Z955 Presence of coronary angioplasty implant and graft: Secondary | ICD-10-CM | POA: Diagnosis not present

## 2023-11-14 DIAGNOSIS — S0990XA Unspecified injury of head, initial encounter: Secondary | ICD-10-CM | POA: Insufficient documentation

## 2023-11-14 DIAGNOSIS — Z79899 Other long term (current) drug therapy: Secondary | ICD-10-CM | POA: Insufficient documentation

## 2023-11-14 DIAGNOSIS — Z91040 Latex allergy status: Secondary | ICD-10-CM | POA: Diagnosis not present

## 2023-11-14 DIAGNOSIS — F0390 Unspecified dementia without behavioral disturbance: Secondary | ICD-10-CM | POA: Diagnosis not present

## 2023-11-14 DIAGNOSIS — I6789 Other cerebrovascular disease: Secondary | ICD-10-CM | POA: Diagnosis not present

## 2023-11-14 DIAGNOSIS — Z7901 Long term (current) use of anticoagulants: Secondary | ICD-10-CM | POA: Insufficient documentation

## 2023-11-14 DIAGNOSIS — Z85038 Personal history of other malignant neoplasm of large intestine: Secondary | ICD-10-CM | POA: Diagnosis not present

## 2023-11-14 DIAGNOSIS — M25552 Pain in left hip: Secondary | ICD-10-CM | POA: Diagnosis not present

## 2023-11-14 DIAGNOSIS — M25561 Pain in right knee: Secondary | ICD-10-CM | POA: Diagnosis not present

## 2023-11-14 DIAGNOSIS — M25559 Pain in unspecified hip: Secondary | ICD-10-CM | POA: Diagnosis not present

## 2023-11-14 DIAGNOSIS — Z043 Encounter for examination and observation following other accident: Secondary | ICD-10-CM | POA: Diagnosis not present

## 2023-11-14 DIAGNOSIS — R600 Localized edema: Secondary | ICD-10-CM | POA: Diagnosis not present

## 2023-11-14 LAB — CBC WITH DIFFERENTIAL/PLATELET
Abs Immature Granulocytes: 0.01 10*3/uL (ref 0.00–0.07)
Basophils Absolute: 0 10*3/uL (ref 0.0–0.1)
Basophils Relative: 1 %
Eosinophils Absolute: 0 10*3/uL (ref 0.0–0.5)
Eosinophils Relative: 1 %
HCT: 30.9 % — ABNORMAL LOW (ref 39.0–52.0)
Hemoglobin: 10.3 g/dL — ABNORMAL LOW (ref 13.0–17.0)
Immature Granulocytes: 0 %
Lymphocytes Relative: 19 %
Lymphs Abs: 0.6 10*3/uL — ABNORMAL LOW (ref 0.7–4.0)
MCH: 32.1 pg (ref 26.0–34.0)
MCHC: 33.3 g/dL (ref 30.0–36.0)
MCV: 96.3 fL (ref 80.0–100.0)
Monocytes Absolute: 0.3 10*3/uL (ref 0.1–1.0)
Monocytes Relative: 10 %
Neutro Abs: 2.3 10*3/uL (ref 1.7–7.7)
Neutrophils Relative %: 69 %
Platelets: 172 10*3/uL (ref 150–400)
RBC: 3.21 MIL/uL — ABNORMAL LOW (ref 4.22–5.81)
RDW: 14.7 % (ref 11.5–15.5)
WBC: 3.4 10*3/uL — ABNORMAL LOW (ref 4.0–10.5)
nRBC: 0 % (ref 0.0–0.2)

## 2023-11-14 LAB — I-STAT CHEM 8, ED
BUN: 31 mg/dL — ABNORMAL HIGH (ref 8–23)
Calcium, Ion: 1.32 mmol/L (ref 1.15–1.40)
Chloride: 105 mmol/L (ref 98–111)
Creatinine, Ser: 1.9 mg/dL — ABNORMAL HIGH (ref 0.61–1.24)
Glucose, Bld: 95 mg/dL (ref 70–99)
HCT: 31 % — ABNORMAL LOW (ref 39.0–52.0)
Hemoglobin: 10.5 g/dL — ABNORMAL LOW (ref 13.0–17.0)
Potassium: 4.2 mmol/L (ref 3.5–5.1)
Sodium: 139 mmol/L (ref 135–145)
TCO2: 24 mmol/L (ref 22–32)

## 2023-11-14 LAB — BASIC METABOLIC PANEL
Anion gap: 9 (ref 5–15)
BUN: 34 mg/dL — ABNORMAL HIGH (ref 8–23)
CO2: 26 mmol/L (ref 22–32)
Calcium: 11.1 mg/dL — ABNORMAL HIGH (ref 8.9–10.3)
Chloride: 104 mmol/L (ref 98–111)
Creatinine, Ser: 1.82 mg/dL — ABNORMAL HIGH (ref 0.61–1.24)
GFR, Estimated: 38 mL/min — ABNORMAL LOW (ref >60)
Glucose, Bld: 99 mg/dL (ref 70–99)
Potassium: 4.3 mmol/L (ref 3.5–5.1)
Sodium: 139 mmol/L (ref 135–145)

## 2023-11-14 LAB — PROTIME-INR
INR: 4.7 (ref 0.8–1.2)
Prothrombin Time: 44.3 s — ABNORMAL HIGH (ref 11.4–15.2)

## 2023-11-14 LAB — I-STAT CG4 LACTIC ACID, ED: Lactic Acid, Venous: 0.6 mmol/L (ref 0.5–1.9)

## 2023-11-14 NOTE — ED Triage Notes (Signed)
[facility], pt FOT  at home , and hit knees and then continue to fall on ground (pt did not hit head). Pt. Takes coumadin. Pt. C/o pain all over body and feels sharp and dull pain as if he is being stabbed. Pt has no loc or sob. Pt has hx of dementia, hypertension arthritis and kyphosis.Mental status is baseline.   98.1 184/100 99 RA  18 rr HR 60

## 2023-11-14 NOTE — ED Provider Notes (Signed)
[place] EMERGENCY DEPARTMENT AT [facility] Provider Note  CSN: [id number] Arrival date & time: 11/14/23 0111  Chief Complaint(s) Fall (/)  HPI [name] is a 77 y.o. male with a past medical history listed below including Alzheimer's dementia who presents from home after mechanical fall.  Fall was witnessed by wife stating that she heard him tumble in the living room while she was sleeping on the couch and woke up to see him fall to his knees and over the coffee table.  She denies any head trauma or loss of consciousness.  He complained mostly of knee and hip pain and had difficulty standing which prompted the call to EMS.  Currently patient denies any physical complaints but history is limited due to his dementia.  Patient is anticoagulated on Coumadin.  The history is provided by the spouse.    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) 02/22/2023   Sinus bradycardia 12/15/2022   Acute urinary retention 12/15/2022   History of adenomatous polyp of colon 11/20/2022   Benign neoplasm of transverse colon 11/20/2022   Bilateral lower extremity edema 10/24/2022   Venous ulcer of left leg (HCC) 10/24/2022   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) 11/05/2021   Hypertensive retinopathy of both eyes 02/21/2020   Former smoker 03/29/2019   Generalized osteoarthritis of multiple sites 09/20/2018   Unstable gait 09/20/2018   Varicose veins of both lower extremities  06/04/2018   Vitamin D deficiency, unspecified 03/29/2018   Essential hypertension 10/22/2017   Hyperlipidemia 10/22/2017   Urinary hesitancy 02/10/2017   Primary hypercoagulable state (HCC) [D68.59] 02/09/2017   Encounter for therapeutic drug monitoring 02/09/2017   CAD (coronary artery disease) 02/09/2017   Anticoagulated 02/09/2017   Home Medication(s) Prior to Admission medications   Medication Sig Start Date End Date Taking? Authorizing Provider  amLODipine (NORVASC) 5 MG tablet TAKE ONE AND ONE-HALF TABLETS DAILY Patient taking differently: Take 5 mg by mouth daily. 01/21/23   Corky Crafts, MD  cholecalciferol (VITAMIN D) 1000 units tablet Take 1,000 Units by mouth daily.     [provider]  Cyanocobalamin (VITAMIN B12) 500 MCG TABS Take 500 mcg by mouth daily.     [provider]  ezetimibe (ZETIA) 10 MG tablet TAKE 1 TABLET AT BEDTIME 05/18/23   Corky Crafts, MD  fenofibrate (TRICOR) 145 MG tablet TAKE 1 TABLET AT BEDTIME 03/31/23   Corky Crafts, MD  finasteride (PROSCAR) 5 MG tablet Take 5 mg by mouth daily. 06/12/23   [provider]  furosemide (LASIX) 40 MG tablet Take 1 tablet (40 mg total) by mouth every other day. 10/20/23 01/18/24  Marcelino Duster, PA  LORazepam (ATIVAN) 0.5 MG tablet Take 0.5 tablets (0.25 mg total) by mouth 2 (two) times daily as needed for anxiety. 10/21/23   [place], Betty G, MD  losartan (COZAAR) 50 MG tablet Take 1 tablet (50 mg total) by mouth daily. 11/02/23  Marcelino Duster, PA  memantine (NAMENDA) 10 MG tablet Take 1 tablet (10 mg total) by mouth 2 (two) times daily. 09/26/23   Marcos Eke, PA-C  naloxegol oxalate (MOVANTIK) 25 MG TABS tablet Take 1 tablet (25 mg total) by mouth daily. 09/03/23   Pyrtle, Carie Caddy, MD  nitroGLYCERIN (NITROSTAT) 0.4 MG SL tablet Place 1 tablet (0.4 mg total) under the tongue every 5 (five) minutes as needed for chest pain. 01/24/22   Corky Crafts, MD   ranolazine (RANEXA) 500 MG 12 hr tablet Take 1 tablet (500 mg total) by mouth 2 (two) times daily. 10/20/23   Marcelino Duster, PA  rosuvastatin (CRESTOR) 40 MG tablet TAKE 1 TABLET DAILY 05/18/23   Corky Crafts, MD  tamsulosin (FLOMAX) 0.4 MG CAPS capsule Take 0.4 mg by mouth daily after supper.    [provider]  traMADol (ULTRAM) 50 MG tablet Take 1 tablet (50 mg total) by mouth every 12 (twelve) hours as needed. Patient taking differently: Take 50 mg by mouth See admin instructions. Take 50mg  by mouth 1-2 times a day. 06/23/23   [place], Betty G, MD  warfarin (COUMADIN) 5 MG tablet TAKE AS DIRECTED BY COUMADIN CLINIC Patient taking differently: Take 5 mg by mouth See admin instructions. Take 2.5 mg on Mon,Wed,Fri,Sat,and Sun. 5 mg on Tue and Thu. 08/08/22   Corky Crafts, MD                                                                                                                                    Allergies Other, Imdur [isosorbide nitrate], and Latex  Review of Systems Review of Systems As noted in HPI  Physical Exam Vital Signs  I have reviewed the triage vital signs BP 115/76 (BP Location: Right Arm)   Pulse (!) 54   Temp 98 F (36.7 C) (Oral)   Resp 18   Ht 6\' 1"  (1.854 m)   Wt 70.3 kg   SpO2 100%   BMI 20.45 kg/m   Physical Exam Constitutional:      General: He is not in acute distress.    Appearance: He is well-developed. He is not diaphoretic.  HENT:     Head: Normocephalic.     Right Ear: External ear normal.     Left Ear: External ear normal.  Eyes:     General: No scleral icterus.       Right eye: No discharge.        Left eye: No discharge.     Conjunctiva/sclera: Conjunctivae normal.     Pupils: Pupils are equal, round, and reactive to light.  Cardiovascular:     Rate and Rhythm: Regular rhythm.     Pulses:          Radial pulses are 2+ on the right side and 2+ on the left side.       Dorsalis pedis pulses are 2+  on the  right side and 2+ on the left side.     Heart sounds: Normal heart sounds. No murmur heard.    No friction rub. No gallop.  Pulmonary:     Effort: Pulmonary effort is normal. No respiratory distress.     Breath sounds: Normal breath sounds. No stridor.  Abdominal:     General: There is no distension.     Palpations: Abdomen is soft.     Tenderness: There is no abdominal tenderness.  Musculoskeletal:     Cervical back: Normal range of motion and neck supple. No bony tenderness.     Thoracic back: No bony tenderness.     Lumbar back: No bony tenderness.     Right lower leg: 2+ Pitting Edema present.     Left lower leg: 2+ Pitting Edema present.     Comments: Clavicle stable. Chest stable to AP/Lat compression. Pelvis stable to Lat compression. No obvious extremity deformity. No chest or abdominal wall contusion.  Skin:    General: Skin is warm.  Neurological:     Mental Status: He is alert and oriented to person, place, and time.     GCS: GCS eye subscore is 4. GCS verbal subscore is 5. GCS motor subscore is 6.     Comments: Moving all extremities      ED Results and Treatments Labs (all labs ordered are listed, but only abnormal results are displayed) Labs Reviewed  CBC WITH DIFFERENTIAL/PLATELET - Abnormal; Notable for the following components:      Result Value   WBC 3.4 (*)    RBC 3.21 (*)    Hemoglobin 10.3 (*)    HCT 30.9 (*)    Lymphs Abs 0.6 (*)    All other components within normal limits  BASIC METABOLIC PANEL - Abnormal; Notable for the following components:   BUN 34 (*)    Creatinine, Ser 1.82 (*)    Calcium 11.1 (*)    GFR, Estimated 38 (*)    All other components within normal limits  PROTIME-INR - Abnormal; Notable for the following components:   Prothrombin Time 44.3 (*)    INR 4.7 (*)    All other components within normal limits  I-STAT CHEM 8, ED - Abnormal; Notable for the following components:   BUN 31 (*)    Creatinine, Ser 1.90 (*)     Hemoglobin 10.5 (*)    HCT 31.0 (*)    All other components within normal limits  I-STAT CG4 LACTIC ACID, ED                                                                                                                         EKG  EKG Interpretation Date/Time:    Ventricular Rate:    PR Interval:    QRS Duration:    QT Interval:    QTC Calculation:   R Axis:      Text Interpretation:  Radiology CT Head Wo Contrast  Result Date: 11/14/2023 CLINICAL DATA:  Fall.  On blood thinners. EXAM: CT HEAD WITHOUT CONTRAST TECHNIQUE: Contiguous axial images were obtained from the base of the skull through the vertex without intravenous contrast. RADIATION DOSE REDUCTION: This exam was performed according to the departmental dose-optimization program which includes automated exposure control, adjustment of the mA and/or kV according to patient size and/or use of iterative reconstruction technique. COMPARISON:  None Available. FINDINGS: Brain: There is atrophy and chronic small vessel disease changes. No acute intracranial abnormality. Specifically, no hemorrhage, hydrocephalus, mass lesion, acute infarction, or significant intracranial injury. Vascular: No hyperdense vessel or unexpected calcification. Skull: No acute calvarial abnormality. Sinuses/Orbits: No acute findings Other: None IMPRESSION: Atrophy, chronic microvascular disease. No acute intracranial abnormality. Electronically Signed   By: Charlett Nose M.D.   On: 11/14/2023 03:19   DG Pelvis 1-2 Views  Result Date: 11/14/2023 CLINICAL DATA:  Fall, pain EXAM: PELVIS - 1-2 VIEW COMPARISON:  08/12/2023 FINDINGS: There is no evidence of pelvic fracture or diastasis. No pelvic bone lesions are seen. IMPRESSION: Negative. Electronically Signed   By: Charlett Nose M.D.   On: 11/14/2023 02:51   DG Knee Complete 4 Views Right  Result Date: 11/14/2023 CLINICAL DATA:  Fall, pain EXAM: RIGHT KNEE - COMPLETE 4+ VIEW COMPARISON:  None Available.  FINDINGS: No acute bony abnormality. Specifically, no fracture, subluxation, or dislocation. No joint effusion. Joint spaces maintained. IMPRESSION: No acute bony abnormality. Electronically Signed   By: Charlett Nose M.D.   On: 11/14/2023 02:50    Medications Ordered in ED Medications - No data to display Procedures Procedures  (including critical care time) Medical Decision Making / ED Course   Medical Decision Making Amount and/or Complexity of Data Reviewed Labs: ordered. Decision-making details documented in ED Course. Radiology: ordered and independent interpretation performed. Decision-making details documented in ED Course.    Mechanical fall. ABCs intact Secondary as above Initial history limited due to dementia but further detail provided when spouse arrived. CT head was ordered and ruled out ICH. Plain films of the right knee and pelvis were negative for any acute injuries. Labs otherwise reassuring though patient's INR is supratherapeutic at 4.7.  Recommended he hold to doses, Saturday and Sunday and restart his regular regimen on Monday.  Wife will call Coumadin clinic as well on Monday.  She states that he has a scheduled appointment for INR check this upcoming Friday as well.    Final Clinical Impression(s) / ED Diagnoses Final diagnoses:  Fall in home, initial encounter    The patient appears reasonably screened and/or stabilized for discharge and I doubt any other medical condition or other [facility] requiring further screening, evaluation, or treatment in the ED at this time. I have discussed the findings, Dx and Tx plan with the patient/family who expressed understanding and agree(s) with the plan. Discharge instructions discussed at length. The patient/family was given strict return precautions who verbalized understanding of the instructions. No further questions at time of discharge.  Disposition: Discharge  Condition: Good  ED Discharge Orders     None         Follow Up: [place], Betty G, MD [address] [place] [address] [phone]  Call  to schedule an appointment for close follow up     This chart was dictated using voice recognition software.  Despite best efforts to proofread,  errors can occur which can change the documentation meaning.    Nira Conn, MD  11/14/23 0358  

## 2023-11-14 NOTE — ED Notes (Signed)
MD Cardama made aware of INR 4.7

## 2023-11-14 NOTE — Discharge Instructions (Signed)
Recommended he hold two coumadin doses, Saturday and Sunday, and restart his regular regimen on Monday due to elevated INR of 4.7 today.

## 2023-11-16 ENCOUNTER — Telehealth: Payer: Self-pay | Admitting: *Deleted

## 2023-11-16 ENCOUNTER — Telehealth: Payer: Self-pay | Admitting: Family Medicine

## 2023-11-16 DIAGNOSIS — R5381 Other malaise: Secondary | ICD-10-CM

## 2023-11-16 DIAGNOSIS — R296 Repeated falls: Secondary | ICD-10-CM

## 2023-11-16 NOTE — Telephone Encounter (Signed)
Order placed for lift chair & message sent to [employer] to see if they can go ahead and process the order prior to OV.

## 2023-11-16 NOTE — Telephone Encounter (Signed)
Patients wife called and was inquiring if Dr [place] can prescribe an order for a lift chair.  Patient was in the ED on Friday night due to a fall, patient has been scheduled to see Dr [place] on Wednesday December 11th.

## 2023-11-16 NOTE — Telephone Encounter (Signed)
Received a call pt's wife and she states the pt fell at home and went to ER and reports INR was 4.7; they advised pt to hold 2 days on 12/7 & 12/8. Advised that is great to know and verified INR in results. Denies any bleeding or any other issues but a decline in his health. Advised that we do need to move appt down to Friday and she verbalized understanding. Also, advised to call back if needed.

## 2023-11-18 ENCOUNTER — Ambulatory Visit: Payer: Medicare Other | Admitting: Family Medicine

## 2023-11-18 ENCOUNTER — Encounter: Payer: Self-pay | Admitting: Family Medicine

## 2023-11-18 ENCOUNTER — Ambulatory Visit: Payer: Medicare Other

## 2023-11-18 VITALS — BP 110/62 | Resp 16 | Ht 73.0 in | Wt 155.0 lb

## 2023-11-18 DIAGNOSIS — R296 Repeated falls: Secondary | ICD-10-CM

## 2023-11-18 DIAGNOSIS — R5381 Other malaise: Secondary | ICD-10-CM

## 2023-11-18 DIAGNOSIS — F02818 Dementia in other diseases classified elsewhere, unspecified severity, with other behavioral disturbance: Secondary | ICD-10-CM

## 2023-11-18 DIAGNOSIS — D6859 Other primary thrombophilia: Secondary | ICD-10-CM | POA: Diagnosis not present

## 2023-11-18 DIAGNOSIS — G301 Alzheimer's disease with late onset: Secondary | ICD-10-CM

## 2023-11-18 DIAGNOSIS — M159 Polyosteoarthritis, unspecified: Secondary | ICD-10-CM | POA: Diagnosis not present

## 2023-11-18 NOTE — Assessment & Plan Note (Signed)
Last INR 4.7 on 11/14/23, coumadin dose was adjusted. We discussed some side effects, he has not had signs of bleeding. Following with Coumadin clinic.

## 2023-11-18 NOTE — Assessment & Plan Note (Signed)
In general muscle weakness is getting worse, mainly LE's, contributing to falls. I think he will benefit for a wheel chair, he cannot maneuver it due to upper and lower back OA but his wife can, specially when going out. His wife has difficulty helping to get him up from sitting position, so a lift chair will also help. Both orders have been sent to medical supplier. Fall precautions to continue.

## 2023-11-18 NOTE — Assessment & Plan Note (Signed)
He has been tolerating tramadol 50 mg 1/2 to 1 tablet twice daily as needed.He takes MiraLAX and Movantik to prevent constipation. No changes in current management. PDMP reviewed.

## 2023-11-18 NOTE — Assessment & Plan Note (Signed)
No significant changes since his last visit. He has a good family support, his son , who lives in town, is calling him every nigh and this has really helped with his general state of mind. He has taking lorazepam 0.5 1/2 tablet 3 times since last visit and his wife feels he has helped with agitation.  He was also recommended to try to help with involuntary leg movement. Currently on Namenda 10 mg twice daily. Following with neurologist.

## 2023-11-18 NOTE — Assessment & Plan Note (Signed)
Wheel chair and lift chair Rx sent to medical supply. Pending palliative care evaluation.

## 2023-11-18 NOTE — Patient Instructions (Addendum)
A few things to remember from today's visit:  Frequent falls - Plan: DME Wheelchair manual  Primary hypercoagulable state [facility]) [D68.59]  Declining functional status  Generalized osteoarthritis of multiple sites - Plan: DME Wheelchair manual  Will order manual wheel chair and lifting chair. No changes today.  If you need refills for medications you take chronically, please call your pharmacy. Do not use My Chart to request refills or for acute issues that need immediate attention. If you send a my chart message, it may take a few days to be addressed, specially if I am not in the office.  Please be sure medication list is accurate. If a new problem present, please set up appointment sooner than planned today.

## 2023-11-18 NOTE — Progress Notes (Signed)
ACUTE VISIT Chief Complaint  Patient presents with   Follow-up   HPI: [name] is a 77 y.o. male with a PMHx significant for CAD, HTN, Alzheimer's disease, HLD, and chronic pain, who is here today with his wife for ED follow up.  Patient was seen in the ED on 12/7 for a fall late on the night of 12/6. He was unable to get up from the fall with the help of his wife.  Head CT, pelvis x-ray, and knee x-ray were performed in the ED and negative for acute fractures or acute intracameral abnormality.  He has had a few falls since his last OV and after ED visit. His wife reports he fell again on 12/8.  Wife recalls an incident on 11/27 where they both feel into the bushes trying to walk up the stairs outside their house.   INR  was found supratherapeutic when recently checked in the ER. Coumadin dose was adjusted. Denies any gum bleeding, nose bleeding, blood in his stool, or bruising.  Lab Results  Component Value Date   INR 4.7 (HH) 11/14/2023   INR 2.4 10/21/2023   INR 1.6 (A) 09/28/2023   His wife mentions he has not been sleeping well, getting up a few times through the night for the past 3 days. He is taking naps during the day. Last night he finally slept through the night.   He reports his appetite is good.  + Fatigue.  His wife is interested in getting him a lift chair. He has a great difficulty standing up from chair, his wife is not able to help him and it is causing her lower back pain.   Alzheimer's disease: He is on Namenda 10 mg bid. Last visit we decided to try Lorazepam 0.5 mg to help with acute anxiety/agitation or leg involuntary movement episodes. His wife reports he has taken the lorazepam 3x since his last visit, all at night, and it has helped. No side effects reports. His wife mentions their son is calling nightly, which has helped with his mood.   Chronic pain/generalized OA: Tramadol 50 mg 0.5-1 tablet and tylenol combination has worked well in  general, most of the time he takes 1/2 tab but occasionally his wife gives him a whole tablet of tramadol.  He denies any constipation. Taking miralax and movantik.   Review of Systems  Constitutional:  Positive for fatigue. Negative for appetite change.  HENT:  Negative for mouth sores, nosebleeds and sore throat.   Respiratory:  Negative for cough and wheezing.   Gastrointestinal:  Negative for abdominal pain, nausea and vomiting.  Genitourinary:  Negative for decreased urine volume and dysuria.  Skin:  Negative for rash.  Neurological:  Negative for syncope and facial asymmetry.  See other pertinent positives and negatives in HPI.  Current Outpatient Medications on File Prior to Visit  Medication Sig Dispense Refill   amLODipine (NORVASC) 5 MG tablet TAKE ONE AND ONE-HALF TABLETS DAILY (Patient taking differently: Take 5 mg by mouth daily.) 135 tablet 3   cholecalciferol (VITAMIN D) 1000 units tablet Take 1,000 Units by mouth daily.      Cyanocobalamin (VITAMIN B12) 500 MCG TABS Take 500 mcg by mouth daily.      ezetimibe (ZETIA) 10 MG tablet TAKE 1 TABLET AT BEDTIME 90 tablet 3   fenofibrate (TRICOR) 145 MG tablet TAKE 1 TABLET AT BEDTIME 90 tablet 3   finasteride (PROSCAR) 5 MG tablet Take 5 mg by mouth daily.  furosemide (LASIX) 40 MG tablet Take 1 tablet (40 mg total) by mouth every other day. 90 tablet 3   LORazepam (ATIVAN) 0.5 MG tablet Take 0.5 tablets (0.25 mg total) by mouth 2 (two) times daily as needed for anxiety. 30 tablet 0   losartan (COZAAR) 50 MG tablet Take 1 tablet (50 mg total) by mouth daily. 90 tablet 2   memantine (NAMENDA) 10 MG tablet Take 1 tablet (10 mg total) by mouth 2 (two) times daily. 180 tablet 0   naloxegol oxalate (MOVANTIK) 25 MG TABS tablet Take 1 tablet (25 mg total) by mouth daily. 30 tablet 1   nitroGLYCERIN (NITROSTAT) 0.4 MG SL tablet Place 1 tablet (0.4 mg total) under the tongue every 5 (five) minutes as needed for chest pain. 75 tablet 2    ranolazine (RANEXA) 500 MG 12 hr tablet Take 1 tablet (500 mg total) by mouth 2 (two) times daily. 60 tablet 0   rosuvastatin (CRESTOR) 40 MG tablet TAKE 1 TABLET DAILY 90 tablet 3   tamsulosin (FLOMAX) 0.4 MG CAPS capsule Take 0.4 mg by mouth daily after supper.     traMADol (ULTRAM) 50 MG tablet Take 1 tablet (50 mg total) by mouth every 12 (twelve) hours as needed. (Patient taking differently: Take 50 mg by mouth See admin instructions. Take 50mg  by mouth 1-2 times a day.) 60 tablet 2   warfarin (COUMADIN) 5 MG tablet TAKE AS DIRECTED BY COUMADIN CLINIC (Patient taking differently: Take 5 mg by mouth See admin instructions. Take 2.5 mg on Mon,Wed,Fri,Sat,and Sun. 5 mg on Tue and Thu.) 80 tablet 3   No current facility-administered medications on file prior to visit.    Past Medical History:  Diagnosis Date   Allergy    Arthritis    CAD (coronary artery disease) 1996   POST CABG   Cataracts, bilateral    Chicken pox    Dyslipidemia    Hyperlipidemia    Hypertension    Sinus bradycardia    Allergies  Allergen Reactions   Other     Environmental allergies   Imdur [Isosorbide Nitrate] Other (See Comments)    headache   Latex     Itchy hands    Social History   Socioeconomic History   Marital status: Married    Spouse name: [name]   Number of children: 2   Years of education: COLLEGE   Highest education level: Bachelor's degree (e.g., BA, AB, BS)  Occupational History   Occupation: RETIRED  Tobacco Use   Smoking status: Former    Current packs/day: 0.00    Types: Cigarettes    Quit date: 05/29/1982    Years since quitting: 41.5   Smokeless tobacco: Never  Vaping Use   Vaping status: Never Used  Substance and Sexual Activity   Alcohol use: Not Currently    Comment: 0-2 drinks a night   Drug use: No   Sexual activity: Not on file  Other Topics Concern   Not on file  Social History Narrative   Right handed   Caffeine   One story home lives with wife   retired    Chief Executive Officer Determinants of Corporate investment banker Strain: Low Risk  (09/20/2023)   Overall Financial Resource Strain (CARDIA)    Difficulty of Paying Living Expenses: Not hard at all  Food Insecurity: No Food Insecurity (09/20/2023)   Hunger Vital Sign    Worried About Running Out of Food in the Last Year: Never true  Ran Out of Food in the Last Year: Never true  Transportation Needs: No Transportation Needs (09/20/2023)   PRAPARE - Administrator, Civil Service (Medical): No    Lack of Transportation (Non-Medical): No  Physical Activity: Inactive (09/20/2023)   Exercise Vital Sign    Days of Exercise per Week: 0 days    Minutes of Exercise per Session: 0 min  Stress: Patient Declined (09/20/2023)   [employer] of Occupational Health - Occupational Stress Questionnaire    Feeling of Stress : Patient declined  Social Connections: Moderately Isolated (09/20/2023)   Social Connection and Isolation Panel [NHANES]    Frequency of Communication with Friends and Family: Three times a week    Frequency of Social Gatherings with Friends and Family: Once a week    Attends Religious Services: Never    Database administrator or Organizations: No    Attends Banker Meetings: Never    Marital Status: Married    Vitals:   11/18/23 1555  BP: 110/62  Resp: 16   Body mass index is 20.45 kg/m.  Physical Exam Vitals and nursing note reviewed.  Constitutional:      General: He is not in acute distress.    Appearance: He is well-developed.  HENT:     Head: Normocephalic and atraumatic.  Eyes:     Conjunctiva/sclera: Conjunctivae normal.  Cardiovascular:     Rate and Rhythm: Normal rate and regular rhythm.     Heart sounds: No murmur heard. Pulmonary:     Effort: Pulmonary effort is normal. No respiratory distress.     Breath sounds: Normal breath sounds.  Abdominal:     Palpations: Abdomen is soft.     Tenderness: There is no abdominal tenderness.   Musculoskeletal:     Cervical back: Decreased range of motion.     Thoracic back: Deformity (Kyphosis) present.  Skin:    General: Skin is warm.     Findings: No rash.  Neurological:     Mental Status: He is alert. Mental status is at baseline.     Comments: In a wheel chair.  Psychiatric:        Mood and Affect: Mood and affect normal.    ASSESSMENT AND PLAN:  Mr. Oba was seen today for ED follow up for fall.   Frequent falls Assessment & Plan: In general muscle weakness is getting worse, mainly LE's, contributing to falls. I think he will benefit for a wheel chair, he cannot maneuver it due to upper and lower back OA but his wife can, specially when going out. His wife has difficulty helping to get him up from sitting position, so a lift chair will also help. Both orders have been sent to medical supplier. Fall precautions to continue.  Orders: -     For home use only DME standard manual wheelchair with seat cushion  Primary hypercoagulable state [facility]) [D68.59] Assessment & Plan: Last INR 4.7 on 11/14/23, coumadin dose was adjusted. We discussed some side effects, he has not had signs of bleeding. Following with Coumadin clinic.   Declining functional status Assessment & Plan: Wheel chair and lift chair Rx sent to medical supply. Pending palliative care evaluation.   Generalized osteoarthritis of multiple sites Assessment & Plan: He has been tolerating tramadol 50 mg 1/2 to 1 tablet twice daily as needed.He takes MiraLAX and Movantik to prevent constipation. No changes in current management. PDMP reviewed.  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 He has a good family support, his son , who lives in town, is calling him every nigh and this has really helped with his general state of  mind. He has taking lorazepam 0.5 1/2 tablet 3 times since last visit and his wife feels he has helped with agitation.  He was also recommended to try to help with involuntary leg movement. Currently on Namenda 10 mg twice daily. Following with neurologist.   I spent a total of 40 minutes in both face to face and non face to face activities for this visit on the date of this encounter. During this time history was obtained and documented, examination was performed, prior labs/imaging reviewed, and assessment/plan discussed. He has enough space in the house for the manual wheel chair, which will be maneuver by his wife.  Because posture and Alzheimer's disease, an electric wheel chair is not a good options. He cannot walk short distances without assistance, balance is getting worse,and having frequent falls.  Return if symptoms worsen or fail to improve, for keep next appointment.  I, Rolla Etienne Wierda, acting as a scribe for Ayisha Pol [place], MD., have documented all relevant documentation on the behalf of Jolea Dolle [place], MD, as directed by  Cason Dabney [place], MD while in the presence of Yailine Ballard [place], MD.   I, Parish Dubose [place], MD, have reviewed all documentation for this visit. The documentation on 11/18/23 for the exam, diagnosis, procedures, and orders are all accurate and complete.  Tyquasia Pant G. [place], MD  [facility]. [place] office.

## 2023-11-19 NOTE — Telephone Encounter (Signed)
Spouse called back to say they would like this Rx sent to [facility] Fax [phone]  Please send by 3 pm, and they will be able to save $100.

## 2023-11-20 ENCOUNTER — Ambulatory Visit: Payer: Medicare Other | Attending: Cardiology

## 2023-11-20 DIAGNOSIS — I10 Essential (primary) hypertension: Secondary | ICD-10-CM | POA: Diagnosis not present

## 2023-11-20 DIAGNOSIS — G8929 Other chronic pain: Secondary | ICD-10-CM | POA: Diagnosis not present

## 2023-11-20 DIAGNOSIS — D509 Iron deficiency anemia, unspecified: Secondary | ICD-10-CM | POA: Diagnosis not present

## 2023-11-20 DIAGNOSIS — M40294 Other kyphosis, thoracic region: Secondary | ICD-10-CM | POA: Diagnosis not present

## 2023-11-20 DIAGNOSIS — H35033 Hypertensive retinopathy, bilateral: Secondary | ICD-10-CM | POA: Diagnosis not present

## 2023-11-20 DIAGNOSIS — K5903 Drug induced constipation: Secondary | ICD-10-CM | POA: Diagnosis not present

## 2023-11-20 DIAGNOSIS — M150 Primary generalized (osteo)arthritis: Secondary | ICD-10-CM | POA: Diagnosis not present

## 2023-11-20 DIAGNOSIS — T402X5D Adverse effect of other opioids, subsequent encounter: Secondary | ICD-10-CM | POA: Diagnosis not present

## 2023-11-20 DIAGNOSIS — M545 Low back pain, unspecified: Secondary | ICD-10-CM | POA: Diagnosis not present

## 2023-11-20 DIAGNOSIS — R296 Repeated falls: Secondary | ICD-10-CM | POA: Diagnosis not present

## 2023-11-20 DIAGNOSIS — E785 Hyperlipidemia, unspecified: Secondary | ICD-10-CM | POA: Diagnosis not present

## 2023-11-20 DIAGNOSIS — F0282 Dementia in other diseases classified elsewhere, unspecified severity, with psychotic disturbance: Secondary | ICD-10-CM | POA: Diagnosis not present

## 2023-11-20 DIAGNOSIS — I2510 Atherosclerotic heart disease of native coronary artery without angina pectoris: Secondary | ICD-10-CM | POA: Diagnosis not present

## 2023-11-20 DIAGNOSIS — H269 Unspecified cataract: Secondary | ICD-10-CM | POA: Diagnosis not present

## 2023-11-20 DIAGNOSIS — Z604 Social exclusion and rejection: Secondary | ICD-10-CM | POA: Diagnosis not present

## 2023-11-20 DIAGNOSIS — R3911 Hesitancy of micturition: Secondary | ICD-10-CM | POA: Diagnosis not present

## 2023-11-20 DIAGNOSIS — D6859 Other primary thrombophilia: Secondary | ICD-10-CM

## 2023-11-20 DIAGNOSIS — E559 Vitamin D deficiency, unspecified: Secondary | ICD-10-CM | POA: Diagnosis not present

## 2023-11-20 DIAGNOSIS — R339 Retention of urine, unspecified: Secondary | ICD-10-CM | POA: Diagnosis not present

## 2023-11-20 DIAGNOSIS — Z951 Presence of aortocoronary bypass graft: Secondary | ICD-10-CM | POA: Diagnosis not present

## 2023-11-20 DIAGNOSIS — G259 Extrapyramidal and movement disorder, unspecified: Secondary | ICD-10-CM | POA: Diagnosis not present

## 2023-11-20 DIAGNOSIS — I890 Lymphedema, not elsewhere classified: Secondary | ICD-10-CM | POA: Diagnosis not present

## 2023-11-20 DIAGNOSIS — Z7901 Long term (current) use of anticoagulants: Secondary | ICD-10-CM | POA: Diagnosis not present

## 2023-11-20 DIAGNOSIS — I83892 Varicose veins of left lower extremities with other complications: Secondary | ICD-10-CM | POA: Diagnosis not present

## 2023-11-20 DIAGNOSIS — Z5181 Encounter for therapeutic drug level monitoring: Secondary | ICD-10-CM

## 2023-11-20 DIAGNOSIS — G301 Alzheimer's disease with late onset: Secondary | ICD-10-CM | POA: Diagnosis not present

## 2023-11-20 LAB — POCT INR: INR: 1.8 — AB (ref 2.0–3.0)

## 2023-11-20 NOTE — Telephone Encounter (Signed)
Rx faxed

## 2023-11-20 NOTE — Patient Instructions (Signed)
Description   Take 1 tablet  today and then continue taking 1/2 tablet daily EXCEPT 1 tablet on Tuesdays and Thursdays.  Stay consistent with greens each week Recheck INR in 2 weeks.  Coumadin Clinic [phone]

## 2023-11-27 DIAGNOSIS — I10 Essential (primary) hypertension: Secondary | ICD-10-CM | POA: Diagnosis not present

## 2023-11-27 DIAGNOSIS — F0282 Dementia in other diseases classified elsewhere, unspecified severity, with psychotic disturbance: Secondary | ICD-10-CM | POA: Diagnosis not present

## 2023-11-27 DIAGNOSIS — I2510 Atherosclerotic heart disease of native coronary artery without angina pectoris: Secondary | ICD-10-CM | POA: Diagnosis not present

## 2023-11-27 DIAGNOSIS — M150 Primary generalized (osteo)arthritis: Secondary | ICD-10-CM | POA: Diagnosis not present

## 2023-11-27 DIAGNOSIS — G8929 Other chronic pain: Secondary | ICD-10-CM | POA: Diagnosis not present

## 2023-11-27 DIAGNOSIS — G301 Alzheimer's disease with late onset: Secondary | ICD-10-CM | POA: Diagnosis not present

## 2023-12-03 DIAGNOSIS — F0282 Dementia in other diseases classified elsewhere, unspecified severity, with psychotic disturbance: Secondary | ICD-10-CM | POA: Diagnosis not present

## 2023-12-03 DIAGNOSIS — M150 Primary generalized (osteo)arthritis: Secondary | ICD-10-CM | POA: Diagnosis not present

## 2023-12-03 DIAGNOSIS — G301 Alzheimer's disease with late onset: Secondary | ICD-10-CM | POA: Diagnosis not present

## 2023-12-03 DIAGNOSIS — I2510 Atherosclerotic heart disease of native coronary artery without angina pectoris: Secondary | ICD-10-CM | POA: Diagnosis not present

## 2023-12-03 DIAGNOSIS — G8929 Other chronic pain: Secondary | ICD-10-CM | POA: Diagnosis not present

## 2023-12-03 DIAGNOSIS — I10 Essential (primary) hypertension: Secondary | ICD-10-CM | POA: Diagnosis not present

## 2023-12-04 ENCOUNTER — Ambulatory Visit: Payer: Medicare Other

## 2023-12-04 ENCOUNTER — Ambulatory Visit: Payer: Medicare Other | Attending: Cardiovascular Disease

## 2023-12-04 DIAGNOSIS — Z5181 Encounter for therapeutic drug level monitoring: Secondary | ICD-10-CM | POA: Insufficient documentation

## 2023-12-04 DIAGNOSIS — D6859 Other primary thrombophilia: Secondary | ICD-10-CM | POA: Insufficient documentation

## 2023-12-04 LAB — POCT INR: INR: 1.8 — AB (ref 2.0–3.0)

## 2023-12-04 NOTE — Patient Instructions (Signed)
Take 1 tablet  today and then continue taking 1/2 tablet daily EXCEPT 1 tablet on Tuesdays and Thursdays.  Stay consistent with greens each week Recheck INR in 3 weeks.  Coumadin Clinic [phone]

## 2023-12-11 DIAGNOSIS — G301 Alzheimer's disease with late onset: Secondary | ICD-10-CM | POA: Diagnosis not present

## 2023-12-11 DIAGNOSIS — G8929 Other chronic pain: Secondary | ICD-10-CM | POA: Diagnosis not present

## 2023-12-11 DIAGNOSIS — I10 Essential (primary) hypertension: Secondary | ICD-10-CM | POA: Diagnosis not present

## 2023-12-11 DIAGNOSIS — F0282 Dementia in other diseases classified elsewhere, unspecified severity, with psychotic disturbance: Secondary | ICD-10-CM | POA: Diagnosis not present

## 2023-12-11 DIAGNOSIS — M150 Primary generalized (osteo)arthritis: Secondary | ICD-10-CM | POA: Diagnosis not present

## 2023-12-11 DIAGNOSIS — I2510 Atherosclerotic heart disease of native coronary artery without angina pectoris: Secondary | ICD-10-CM | POA: Diagnosis not present

## 2023-12-20 ENCOUNTER — Other Ambulatory Visit: Payer: Self-pay | Admitting: Physician Assistant

## 2023-12-20 ENCOUNTER — Other Ambulatory Visit: Payer: Self-pay | Admitting: Family Medicine

## 2023-12-20 DIAGNOSIS — M159 Polyosteoarthritis, unspecified: Secondary | ICD-10-CM

## 2023-12-23 ENCOUNTER — Ambulatory Visit: Payer: Medicare Other | Admitting: Family Medicine

## 2023-12-23 ENCOUNTER — Other Ambulatory Visit: Payer: Self-pay

## 2023-12-23 MED ORDER — LOSARTAN POTASSIUM 50 MG PO TABS: 50.0000 mg | ORAL_TABLET | Freq: Every day | ORAL | 2 refills | Status: DC

## 2023-12-24 DIAGNOSIS — R350 Frequency of micturition: Secondary | ICD-10-CM | POA: Diagnosis not present

## 2023-12-24 DIAGNOSIS — N401 Enlarged prostate with lower urinary tract symptoms: Secondary | ICD-10-CM | POA: Diagnosis not present

## 2023-12-24 DIAGNOSIS — R310 Gross hematuria: Secondary | ICD-10-CM | POA: Diagnosis not present

## 2023-12-24 DIAGNOSIS — R3911 Hesitancy of micturition: Secondary | ICD-10-CM | POA: Diagnosis not present

## 2023-12-25 ENCOUNTER — Ambulatory Visit: Payer: Medicare Other | Attending: Cardiovascular Disease

## 2023-12-25 DIAGNOSIS — D6859 Other primary thrombophilia: Secondary | ICD-10-CM | POA: Insufficient documentation

## 2023-12-25 DIAGNOSIS — Z5181 Encounter for therapeutic drug level monitoring: Secondary | ICD-10-CM | POA: Insufficient documentation

## 2023-12-25 LAB — POCT INR: INR: 3.4 — AB (ref 2.0–3.0)

## 2023-12-25 NOTE — Patient Instructions (Signed)
Hold today only and then continue taking 1/2 tablet daily EXCEPT 1 tablet on Tuesdays and Thursdays.  Stay consistent with greens each week Recheck INR in 3 weeks.  Coumadin Clinic [phone]

## 2024-01-08 ENCOUNTER — Ambulatory Visit (INDEPENDENT_AMBULATORY_CARE_PROVIDER_SITE_OTHER): Payer: Medicare Other

## 2024-01-08 VITALS — BP 122/70 | HR 54 | Temp 97.9°F | Ht 64.5 in | Wt 152.9 lb

## 2024-01-08 DIAGNOSIS — Z0000 Encounter for general adult medical examination without abnormal findings: Secondary | ICD-10-CM

## 2024-01-08 NOTE — Progress Notes (Cosign Needed)
Subjective:   [name] is a 78 y.o. male who presents for Medicare Annual/Subsequent preventive examination.  Visit Complete: In person  Patient Medicare AWV questionnaire was completed by the patient on 01/06/24; I have confirmed that all information answered by patient is correct and no changes since this date.  Cardiac Risk Factors include: advanced age (>52men, >67 women);hypertension;male gender     Objective:    Today's Vitals   01/08/24 1120 01/08/24 1124  BP: 122/70   Pulse: (!) 54   Temp: 97.9 F (36.6 C)   SpO2: 99%   Weight: 152 lb 14.4 oz (69.4 kg)   Height: 5' 4.5" (1.638 m)   PainSc:  6    Body mass index is 25.84 kg/m.     01/08/2024   11:48 AM 11/14/2023    4:34 AM 08/07/2023    1:00 PM 07/27/2023    9:26 AM 07/14/2023   11:49 AM 01/09/2023   12:43 PM 12/24/2022    1:33 PM  Advanced Directives  Does Patient Have a Medical Advance Directive? Yes No No Yes Yes No Yes  Type of Estate agent of [place];Living will  Healthcare Power of [place];Out of facility DNR (pink MOST or yellow form);Living will Healthcare Power of [place];Living will;Out of facility DNR (pink MOST or yellow form) Healthcare Power of [place];Living will;Out of facility DNR (pink MOST or yellow form)  Healthcare Power of [place];Living will  Does patient want to make changes to medical advance directive?     No - Patient declined    Copy of Healthcare Power of Attorney in Chart? No - copy requested    No - copy requested, Physician notified  No - copy requested  Would patient like information on creating a medical advance directive?   No - Patient declined        Current Medications (verified) Outpatient Encounter Medications as of 01/08/2024  Medication Sig   amLODipine (NORVASC) 5 MG tablet TAKE ONE AND ONE-HALF TABLETS DAILY (Patient taking differently: Take 5 mg by mouth daily.)   cholecalciferol (VITAMIN D) 1000 units tablet Take 1,000 Units by mouth  daily.    Cyanocobalamin (VITAMIN B12) 500 MCG TABS Take 500 mcg by mouth daily.    ezetimibe (ZETIA) 10 MG tablet TAKE 1 TABLET AT BEDTIME   fenofibrate (TRICOR) 145 MG tablet TAKE 1 TABLET AT BEDTIME   finasteride (PROSCAR) 5 MG tablet Take 5 mg by mouth daily.   furosemide (LASIX) 40 MG tablet Take 1 tablet (40 mg total) by mouth every other day.   LORazepam (ATIVAN) 0.5 MG tablet Take 0.5 tablets (0.25 mg total) by mouth 2 (two) times daily as needed for anxiety.   losartan (COZAAR) 50 MG tablet Take 1 tablet (50 mg total) by mouth daily.   memantine (NAMENDA) 10 MG tablet TAKE 1 TABLET TWICE A DAY   naloxegol oxalate (MOVANTIK) 25 MG TABS tablet Take 1 tablet (25 mg total) by mouth daily.   nitroGLYCERIN (NITROSTAT) 0.4 MG SL tablet Place 1 tablet (0.4 mg total) under the tongue every 5 (five) minutes as needed for chest pain.   ranolazine (RANEXA) 500 MG 12 hr tablet Take 1 tablet (500 mg total) by mouth 2 (two) times daily.   rosuvastatin (CRESTOR) 40 MG tablet TAKE 1 TABLET DAILY   tamsulosin (FLOMAX) 0.4 MG CAPS capsule Take 0.4 mg by mouth daily after supper.   traMADol (ULTRAM) 50 MG tablet Take 1 tablet (50 mg total) by mouth every 12 (twelve)  hours as needed.   warfarin (COUMADIN) 5 MG tablet TAKE AS DIRECTED BY COUMADIN CLINIC (Patient taking differently: Take 5 mg by mouth See admin instructions. Take 2.5 mg on Mon,Wed,Fri,Sat,and Sun. 5 mg on Tue and Thu.)   No facility-administered encounter medications on file as of 01/08/2024.    Allergies (verified) Other, Imdur [isosorbide nitrate], and Latex   History: Past Medical History:  Diagnosis Date   Allergy    Arthritis    CAD (coronary artery disease) 1996   POST CABG   Cataracts, bilateral    Chicken pox    Dyslipidemia    Hyperlipidemia    Hypertension    Sinus bradycardia    Past Surgical History:  Procedure Laterality Date   BYPASS GRAFT  1996   CATARACT EXTRACTION Right    COLONOSCOPY WITH PROPOFOL N/A  11/20/2022   Procedure: COLONOSCOPY WITH PROPOFOL;  Surgeon: Beverley Fiedler, MD;  Location: [facility] ENDOSCOPY;  Service: Gastroenterology;  Laterality: N/A;   INGUINAL HERNIA REPAIR Right 04/09/2020   Procedure: LAPAROSCOPIC RIGHT INGUINAL HERNIA REPAIR WITH MESH;  Surgeon: Axel Filler, MD;  Location: [facility] OR;  Service: General;  Laterality: Right;   POLYPECTOMY  11/20/2022   Procedure: POLYPECTOMY;  Surgeon: Beverley Fiedler, MD;  Location: [facility] ENDOSCOPY;  Service: Gastroenterology;;   REFRACTIVE SURGERY Right    SKIN BIOPSY     head x 2   UMBILICAL HERNIA REPAIR  2003   Family History  Problem Relation Age of Onset   Alzheimer's disease Mother    Cancer Father        prostate   Prostate cancer Father    AAA (abdominal aortic aneurysm) Sister    Hypertension Maternal Grandmother    Hypertension Paternal Grandmother    Colon cancer Other        great uncle   Esophageal cancer Neg Hx    Pancreatic cancer Neg Hx    Liver disease Neg Hx    Stomach cancer Neg Hx    Social History   Socioeconomic History   Marital status: Married    Spouse name: [name]   Number of children: 2   Years of education: COLLEGE   Highest education level: Bachelor's degree (e.g., BA, AB, BS)  Occupational History   Occupation: RETIRED  Tobacco Use   Smoking status: Former    Current packs/day: 0.00    Types: Cigarettes    Quit date: 05/29/1982    Years since quitting: 41.6   Smokeless tobacco: Never  Vaping Use   Vaping status: Never Used  Substance and Sexual Activity   Alcohol use: Not Currently    Comment: 0-2 drinks a night   Drug use: No   Sexual activity: Not on file  Other Topics Concern   Not on file  Social History Narrative   Right handed   Caffeine   One story home lives with wife   retired   Chief Executive Officer Drivers of Corporate investment banker Strain: Low Risk  (01/08/2024)   Overall Financial Resource Strain (CARDIA)    Difficulty of Paying Living Expenses: Not hard at all  Food  Insecurity: No Food Insecurity (01/08/2024)   Hunger Vital Sign    Worried About Running Out of Food in the Last Year: Never true    Ran Out of Food in the Last Year: Never true  Transportation Needs: No Transportation Needs (01/08/2024)   PRAPARE - Administrator, Civil Service (Medical): No    Lack of Transportation (Non-Medical):  No  Physical Activity: Inactive (01/08/2024)   Exercise Vital Sign    Days of Exercise per Week: 0 days    Minutes of Exercise per Session: 0 min  Stress: No Stress Concern Present (01/08/2024)   [employer] of Occupational Health - Occupational Stress Questionnaire    Feeling of Stress : Not at all  Recent Concern: Stress - Stress Concern Present (12/19/2023)   [employer] of Occupational Health - Occupational Stress Questionnaire    Feeling of Stress : To some extent  Social Connections: Moderately Isolated (01/08/2024)   Social Connection and Isolation Panel [NHANES]    Frequency of Communication with Friends and Family: More than three times a week    Frequency of Social Gatherings with Friends and Family: Once a week    Attends Religious Services: Never    Database administrator or Organizations: No    Attends Engineer, structural: Never    Marital Status: Married    Tobacco Counseling Counseling given: Not Answered   Clinical Intake:  Pre-visit preparation completed: Yes  Pain : 0-10 Pain Score: 6  Pain Type: Acute pain Pain Location: Eye (started this morning upon arrival) Pain Orientation: Right Pain Descriptors / Indicators: Aching Pain Onset: Today Pain Frequency: Rarely Effect of Pain on Daily Activities: light     BMI - recorded: 25.84 Nutritional Status: BMI 25 -29 Overweight Diabetes: No  How often do you need to have someone help you when you read instructions, pamphlets, or other written materials from your doctor or pharmacy?: 3 - Sometimes (Spouse assist sometimes)  Interpreter Needed?:  No  Information entered by :: Hassell Halim, CMA   Activities of Daily Living    01/08/2024   11:32 AM 01/06/2024    9:51 AM  In your present state of health, do you have any difficulty performing the following activities:  Hearing? 0 0  Vision? 0 0  Difficulty concentrating or making decisions? 0 1  Walking or climbing stairs? 0 1  Dressing or bathing? 0 1  Doing errands, shopping? 0 1  Preparing Food and eating ? Y Y  Comment Spouse assist sometime   Using the Toilet? Y Y  Comment Spouse assist sometimes   In the past six months, have you accidently leaked urine? Malvin Johns  Comment wears briefs - sees Dr Alvester Morin (Urologist)   Do you have problems with loss of bowel control? N N  Managing your Medications? Y Y  Comment Spouse assists   Managing your Finances? Y Y  Comment Spouse assists   Housekeeping or managing your Housekeeping? [place]  Comment Spouse assists     Patient Care Team: [place], Betty G, MD as PCP - General (Family Medicine) Corky Crafts, MD as PCP - Cardiology (Cardiology) Duke, Roe Rutherford, PA as Physician Assistant (Cardiology) Elwyn Reach (Neurology)  Indicate any recent Medical Services you may have received from other than [facility] providers in the past year (date may be approximate).     Assessment:   This is a routine wellness examination for Jocelyn.  Hearing/Vision screen Hearing Screening - Comments:: Denies hearing difficulties   Vision Screening - Comments:: Wears rx glasses for reading - up to date with routine eye exams with  Dr Bascom Levels   Goals Addressed               This Visit's Progress     Increase physical activity (pt-stated)        Walk (exercise) more  Depression Screen    01/08/2024   11:38 AM 08/05/2023    9:34 AM 06/24/2023   10:04 AM 10/24/2022    2:03 PM 12/23/2021    1:13 PM 12/23/2021    1:11 PM 11/05/2021    5:13 PM  PHQ 2/9 Scores  PHQ - 2 Score 0 0 1 0 0 0 0  PHQ- 9 Score  7 12        Fall  Risk    01/08/2024   11:42 AM 01/06/2024    9:51 AM 11/18/2023   12:00 AM 09/07/2023    3:14 PM 08/05/2023    9:34 AM  Fall Risk   Falls in the past year? 1 1 1 1 1   Number falls in past yr: 0 1 1 0 0  Injury with Fall? 0 0 0 1 0  Risk for fall due to : Impaired balance/gait   No Fall Risks Impaired balance/gait;Impaired mobility  Follow up Falls prevention discussed   Falls evaluation completed Falls evaluation completed    MEDICARE RISK AT HOME: Medicare Risk at Home Any stairs in or around the home?: Yes If so, are there any without handrails?: Yes Home free of loose throw rugs in walkways, pet beds, electrical cords, etc?: Yes Adequate lighting in your home to reduce risk of falls?: Yes Life alert?: No Use of a cane, walker or w/c?: Yes (cane/walker) Grab bars in the bathroom?: Yes Shower chair or bench in shower?: Yes Elevated toilet seat or a handicapped toilet?: Yes  TIMED UP AND GO:  Was the test performed?  No    Cognitive Function:    07/17/2022   12:00 PM 03/23/2018   11:49 AM  MMSE - Mini Mental State Exam  Not completed:  --  Orientation to time 0   Orientation to Place 4   Registration 3   Attention/ Calculation 0   Recall 2   Language- name 2 objects 2   Language- repeat 1   Language- follow 3 step command 3   Language- read & follow direction 1   Write a sentence 0   Copy design 0   Total score 16       10/11/2021    1:00 PM  Montreal Cognitive Assessment   Visuospatial/ Executive (0/5) 0  Naming (0/3) 2  Attention: Read list of digits (0/2) 2  Attention: Read list of letters (0/1) 1  Attention: Serial 7 subtraction starting at 100 (0/3) 2  Language: Repeat phrase (0/2) 0  Language : Fluency (0/1) 1  Abstraction (0/2) 2  Delayed Recall (0/5) 2  Orientation (0/6) 5  Total 17  Adjusted Score (based on education) 17      01/08/2024   11:44 AM 12/24/2022    1:33 PM 12/23/2021    1:19 PM  6CIT Screen  What Year? 4 points 0 points 0 points   What month? 3 points 0 points 0 points  What time?  0 points 0 points  Count back from 20 0 points 0 points 0 points  Months in reverse 4 points 2 points 2 points  Repeat phrase 10 points 0 points 0 points  Total Score  2 points 2 points    Immunizations Immunization History  Administered Date(s) Administered   Fluad Quad(high Dose 65+) 11/05/2021, 10/24/2022   Influenza,inj,Quad PF,6+ Mos 10/16/2017, 09/20/2018, 09/06/2019, 11/26/2020   PFIZER Comirnaty(Gray Top)Covid-19 Tri-Sucrose Vaccine 05/14/2021   PFIZER(Purple Top)SARS-COV-2 Vaccination 01/15/2020, 02/09/2020, 09/22/2020   Pfizer Covid-19 Vaccine Bivalent Booster 36yrs & up  10/22/2021   Pneumococcal Conjugate-13 11/15/2015   Pneumococcal-Unspecified 11/08/2014    TDAP status: Up to date  Flu Vaccine status: Declined, Education has been provided regarding the importance of this vaccine but patient still declined. Advised may receive this vaccine at local pharmacy or Health Dept. Aware to provide a copy of the vaccination record if obtained from local pharmacy or Health Dept. Verbalized acceptance and understanding.  Pneumococcal vaccine status: Due, Education has been provided regarding the importance of this vaccine. Advised may receive this vaccine at local pharmacy or Health Dept. Aware to provide a copy of the vaccination record if obtained from local pharmacy or Health Dept. Verbalized acceptance and understanding.  Covid-19 vaccine status: Declined, Education has been provided regarding the importance of this vaccine but patient still declined. Advised may receive this vaccine at local pharmacy or Health Dept.or vaccine clinic. Aware to provide a copy of the vaccination record if obtained from local pharmacy or Health Dept. Verbalized acceptance and understanding.  Qualifies for Shingles Vaccine? No   Zostavax completed No   Shingrix Completed?: No.    Education has been provided regarding the importance of this vaccine.  Patient has been advised to call insurance company to determine out of pocket expense if they have not yet received this vaccine. Advised may also receive vaccine at local pharmacy or Health Dept. Verbalized acceptance and understanding.  Screening Tests Health Maintenance  Topic Date Due   COVID-19 Vaccine (6 - 2024-25 season) 08/09/2023   INFLUENZA VACCINE  03/07/2024 (Originally 07/09/2023)   Zoster Vaccines- Shingrix (1 of 2) 12/16/2024 (Originally 10/31/1997)   Medicare Annual Wellness (AWV)  01/07/2025   Colonoscopy  11/20/2025   Hepatitis C Screening  Completed   HPV VACCINES  Aged Out   DTaP/Tdap/Td  Discontinued   Pneumonia Vaccine 49+ Years old  Discontinued    Health Maintenance  Health Maintenance Due  Topic Date Due   COVID-19 Vaccine (6 - 2024-25 season) 08/09/2023    Colorectal cancer screening: Type of screening: Colonoscopy. Completed 12/24/223. Repeat every 3 years   Additional Screening:  Hepatitis C Screening: does not qualify; Completed 03/29/18  Vision Screening: Recommended annual ophthalmology exams for early detection of glaucoma and other disorders of the eye. Is the patient up to date with their annual eye exam?  Yes  Who is the provider or what is the name of the office in which the patient attends annual eye exams? Dr Bascom Levels If pt is not established with a provider, would they like to be referred to a provider to establish care? No .   Dental Screening: Recommended annual dental exams for proper oral hygiene  Community Resource Referral / Chronic Care Management: CRR required this visit?  No   CCM required this visit?  No     Plan:     I have personally reviewed and noted the following in the patient's chart:   Medical and social history Use of alcohol, tobacco or illicit drugs  Current medications and supplements including opioid prescriptions. Patient is currently taking opioid prescriptions. Information provided to patient regarding  non-opioid alternatives. Patient advised to discuss non-opioid treatment plan with their provider. Functional ability and status Nutritional status Physical activity Advanced directives List of other physicians Hospitalizations, surgeries, and ER visits in previous 12 months Vitals Screenings to include cognitive, depression, and falls Referrals and appointments  In addition, I have reviewed and discussed with patient certain preventive protocols, quality metrics, and best practice recommendations. A written personalized care plan for  preventive services as well as general preventive health recommendations were provided to patient.     Tillie Rung, LPN   01/21/864   After Visit Summary: (MyChart) Due to this being a telephonic visit, the after visit summary with patients personalized plan was offered to patient via MyChart   Nurse Notes: none

## 2024-01-08 NOTE — Patient Instructions (Addendum)
[name] , Thank you for taking time to come for your Medicare Wellness Visit. I appreciate your ongoing commitment to your health goals. Please review the following plan we discussed and let me know if I can assist you in the future.   Referrals/Orders/Follow-Ups/Clinician Recommendations: Encouraged to exercise.  Aim for 30 minutes of exercise or brisk walking, 6-8 glasses of water, and 5 servings of fruits and vegetables each day.   This is a list of the screening recommended for you and due dates:  Health Maintenance  Topic Date Due   COVID-19 Vaccine (6 - 2024-25 season) 08/09/2023   Flu Shot  03/07/2024*   Zoster (Shingles) Vaccine (1 of 2) 12/16/2024*   Medicare Annual Wellness Visit  01/07/2025   Colon Cancer Screening  11/20/2025   Hepatitis C Screening  Completed   HPV Vaccine  Aged Out   DTaP/Tdap/Td vaccine  Discontinued   Pneumonia Vaccine  Discontinued  *Topic was postponed. The date shown is not the original due date.  Opioid Pain Medicine Management Opioid pain medicines are strong medicines that are used to treat bad or very bad pain. When you take them for a short time, they can help you: Sleep better. Do better in physical therapy. Feel better during the first few days after you get hurt. Recover from surgery. Only take these medicines if a doctor says that you can. You should only take them for a short time. This is because opioids can be very addictive. This means that they are hard to stop taking. The longer you take opioids, the harder it may be to stop taking them. What are the risks? Opioids can cause problems (side effects). Taking them for more than 3 days raises your chance of problems, such as: Trouble pooping (constipation). Feeling sick to your stomach (nausea). Vomiting. Feeling very sleepy. Confusion. Not being able to stop taking the medicine. Breathing problems. Taking opioids for a long time can make it hard for you to do daily tasks. It can  also put you at risk for: Car accidents. Depression. Suicide. Heart attack. Taking too much of the medicine (overdose). This can lead to death. What is a pain treatment plan? A pain treatment plan is a plan made by you and your doctor. Work with your doctor to make a plan for treating your pain. To help you do this: Talk about the goals of your treatment, including: How much pain you might expect to have. How you will manage the pain. Talk about the risks and benefits of taking these medicines for your condition. Remember that a good treatment plan uses more than one approach and lowers the risks of side effects. Tell your doctor about the amount of medicines you take and about any drug or alcohol use. Get your pain medicine prescriptions from only one doctor. Pain can be managed with other treatments. Work with your doctor to find other ways to help your pain, such as: Physical therapy or doing gentle exercises. Counseling. Eating healthy foods. Massage. Meditation. Other pain medicines. How to use opioid pain medicine safely Taking medicine Take your pain medicine exactly as told by your doctor. Take it only when you need it. If your pain is not too bad, you may take less medicine if your doctor allows. If you have no pain, do not take the medicine unless your doctor tells you to take it. If your pain is very bad, do not take more medicine than your doctor told you to take. Call your  doctor to know what to do. Write down the times when you take your pain medicine. Look at the times before you take your next dose. Take other over-the-counter or prescription medicines only as told by your doctor. Keeping yourself and others safe  While you are taking opioids: Do not drive, use machines, or power tools. Do not sign important papers (legal documents). Do not drink alcohol. Do not take sleeping pills. Do not take care of children by yourself. Do not do activities where you need to  climb or be in high places, like working on a ladder. Do not go to a lake, river, ocean, swimming pool, or hot tub. Keep your opioids locked up or in a place where children cannot reach them. Do not share your pain medicine with anyone. Stopping your use of opioids If you have been taking opioids for more than a few weeks, you may need to slowly decrease (taper) how much you take until you stop taking them. Doing this can lower your chance of having symptoms.  Symptoms that come from suddenly stopping the use of opioids include: Pain and cramping in your belly (abdomen). Feeling sick to your stomach (nausea).z Sweating. Feeling very sleepy. Feeling restless. Shaking you cannot control (tremors). Cravings for the medicine. Do not try to stop taking them by yourself. Work with your doctor to stop. Your doctor will help you take less until you are not taking the medicine at all. Getting rid of unused pills Do not save any pills that you did not use. Get rid of the pills by: Taking them to a take-back program in your area. Bringing them to a pharmacy that receives unused pills. Flushing them down the toilet. Check the label or package insert of your medicine to see whether this is safe to do. Throwing them in the trash. Check the label or package insert of your medicine to see whether this is safe to do. If it is safe to throw them out: Take the pills out of their container. Put the pills into a container you can seal. Mix the pills with used coffee grounds, food scraps, dirt, or cat litter. Put this in the trash. Follow these instructions at home: Activity Do exercises as told by your doctor. Avoid doing things that make your pain worse. Return to your normal activities as told by your doctor. Ask your doctor what activities are safe for you. General instructions You may need to take these actions to prevent or treat constipation: Drink enough fluid to keep your pee (urine) pale  yellow. Take over-the-counter or prescription medicines. Eat foods that are high in fiber. These include beans, whole grains, and fresh fruits and vegetables. Limit foods that are high in fat and sugar. These include fried or sweet foods. Keep all follow-up visits. Where to find support If you have been taking opioids for a long time, get help from a local support group or counselor. Ask your doctor about this. Where to find more information Centers for Disease Control and Prevention (CDC): FootballExhibition.com.br [place] Food and Drug Administration (FDA): PumpkinSearch.com.ee Get help right away if: You may have taken too much of an opioid (overdosed). Common symptoms of an overdose: Your breathing is slower or more shallow than normal. You have a very slow heartbeat. Your speech is not normal. You vomit or you feel as if you may vomit. The black centers of your eyes (pupils) are smaller than normal. You have other potential symptoms: You feel very confused. You faint.  You are very sleepy. You have cold skin. You have blue lips or fingernails. You have thoughts of harming yourself or harming others. These symptoms may be an emergency. Get help right away. Call your local emergency services (911 in the [place] Do not wait to see if the symptoms will go away. Do not drive yourself to the hospital. Get help right away if you feel like you may hurt yourself or others, or have thoughts about taking your own life. Go to your nearest emergency room or: Call your local emergency services (911 in the [place] Call the [facility] at [phone]. Call a suicide crisis helpline, such as the National Suicide Prevention Lifeline at (409)808-1231 or 988 in the [place] This is open 24 hours a day. If you're a Veteran: Call 988 and press 1. This is open 24 hours a day. Text the [employer] at (404)593-5769. Summary Opioid are strong medicines that are used to treat bad or very bad pain. A pain  treatment plan is a plan made by you and your doctor. Work with your doctor to make a plan for treating your pain. If you think that you or someone else may have taken too much of an opioid, get help right away. This information is not intended to replace advice given to you by your health care provider. Make sure you discuss any questions you have with your health care provider. Document Revised: 07/09/2023 Document Reviewed: 03/06/2021 Elsevier Patient Education  2024 Elsevier Inc.  Advanced directives: (Copy Requested) Please bring a copy of your health care power of attorney and living will to the office to be added to your chart at your convenience.  Next Medicare Annual Wellness Visit scheduled for next year: Yes

## 2024-01-12 DIAGNOSIS — R3914 Feeling of incomplete bladder emptying: Secondary | ICD-10-CM | POA: Diagnosis not present

## 2024-01-12 DIAGNOSIS — R300 Dysuria: Secondary | ICD-10-CM | POA: Diagnosis not present

## 2024-01-13 ENCOUNTER — Ambulatory Visit (INDEPENDENT_AMBULATORY_CARE_PROVIDER_SITE_OTHER): Payer: Medicare Other | Admitting: Family Medicine

## 2024-01-13 ENCOUNTER — Encounter: Payer: Self-pay | Admitting: Family Medicine

## 2024-01-13 VITALS — BP 118/66 | HR 60 | Temp 98.4°F | Resp 16 | Ht 64.5 in

## 2024-01-13 DIAGNOSIS — G301 Alzheimer's disease with late onset: Secondary | ICD-10-CM | POA: Diagnosis not present

## 2024-01-13 DIAGNOSIS — F02818 Dementia in other diseases classified elsewhere, unspecified severity, with other behavioral disturbance: Secondary | ICD-10-CM

## 2024-01-13 DIAGNOSIS — I872 Venous insufficiency (chronic) (peripheral): Secondary | ICD-10-CM

## 2024-01-13 DIAGNOSIS — R238 Other skin changes: Secondary | ICD-10-CM

## 2024-01-13 NOTE — Progress Notes (Signed)
 ACUTE VISIT Chief Complaint  Patient presents with   Wound Check    Noticed last Wednesday    HPI: [name] is a 78 y.o. male with a PMHx significant for CAD, HTN, Alzheimer's disease, HLD, and chronic pain, who is here today with his wife complaining of a left lower leg blister. His wife provides history.  Patient's wife says he has a large blister on his left lower leg which they first noted on 1/29. She says it started as 2 blisters but they have merged.  Problem started after prolonged walking the day and night before. No hx of trauma.  To this point, there is no drainage, weeping, or pain.  His wife states she was unable to get an appointment with the wound clinic until 3/27.   Has treated area with Tegaderm and calcium  alginate. Venous insufficiency: He has had ulcers on both legs in the past.   He was last seen on 11/18/2023, no new problem since then. He is  taking Alprazolam 0.25 mg 1/2-1 tab daily as needed for episodes of involuntary leg movement and agitation and Tramadol  for chronic pain. No problem with these medication and seem to have helped..  Alzheimer's dementia on Namenda  10 mg bid. Follows with neurologist.  Review of Systems  Constitutional:  Negative for appetite change, chills and fever.  Respiratory:  Negative for cough and shortness of breath.   Cardiovascular:  Negative for chest pain.  Gastrointestinal:  Negative for abdominal pain, nausea and vomiting.  Genitourinary:  Negative for decreased urine volume, dysuria and hematuria.  Musculoskeletal:  Positive for arthralgias, back pain and gait problem.  Skin:  Negative for rash.  See other pertinent positives and negatives in HPI.  Current Outpatient Medications on File Prior to Visit  Medication Sig Dispense Refill   amLODipine  (NORVASC ) 5 MG tablet TAKE ONE AND ONE-HALF TABLETS DAILY (Patient taking differently: Take 5 mg by mouth daily.) 135 tablet 3   cholecalciferol (VITAMIN D ) 1000  units tablet Take 1,000 Units by mouth daily.      Cyanocobalamin  (VITAMIN B12) 500 MCG TABS Take 500 mcg by mouth daily.      ezetimibe  (ZETIA ) 10 MG tablet TAKE 1 TABLET AT BEDTIME 90 tablet 3   fenofibrate  (TRICOR ) 145 MG tablet TAKE 1 TABLET AT BEDTIME 90 tablet 3   finasteride (PROSCAR) 5 MG tablet Take 5 mg by mouth daily.     furosemide  (LASIX ) 40 MG tablet Take 1 tablet (40 mg total) by mouth every other day. 90 tablet 3   LORazepam  (ATIVAN ) 0.5 MG tablet Take 0.5 tablets (0.25 mg total) by mouth 2 (two) times daily as needed for anxiety. 30 tablet 0   losartan  (COZAAR ) 50 MG tablet Take 1 tablet (50 mg total) by mouth daily. 90 tablet 2   memantine  (NAMENDA ) 10 MG tablet TAKE 1 TABLET TWICE A DAY 180 tablet 3   naloxegol  oxalate (MOVANTIK ) 25 MG TABS tablet Take 1 tablet (25 mg total) by mouth daily. 30 tablet 1   nitroGLYCERIN  (NITROSTAT ) 0.4 MG SL tablet Place 1 tablet (0.4 mg total) under the tongue every 5 (five) minutes as needed for chest pain. 75 tablet 2   ranolazine  (RANEXA ) 500 MG 12 hr tablet Take 1 tablet (500 mg total) by mouth 2 (two) times daily. 60 tablet 0   rosuvastatin  (CRESTOR ) 40 MG tablet TAKE 1 TABLET DAILY 90 tablet 3   tamsulosin (FLOMAX) 0.4 MG CAPS capsule Take 0.4 mg by mouth daily after  supper.     traMADol  (ULTRAM ) 50 MG tablet Take 1 tablet (50 mg total) by mouth every 12 (twelve) hours as needed. 60 tablet 2   warfarin (COUMADIN ) 5 MG tablet TAKE AS DIRECTED BY COUMADIN  CLINIC (Patient taking differently: Take 5 mg by mouth See admin instructions. Take 2.5 mg on Mon,Wed,Fri,Sat,and Sun. 5 mg on Tue and Thu.) 80 tablet 3   No current facility-administered medications on file prior to visit.    Past Medical History:  Diagnosis Date   Allergy    Arthritis    CAD (coronary artery disease) 1996   POST CABG   Cataracts, bilateral    Chicken pox    Dyslipidemia    Hyperlipidemia    Hypertension    Sinus bradycardia    Allergies  Allergen Reactions    Other     Environmental allergies   Imdur  [Isosorbide  Nitrate] Other (See Comments)    headache   Latex     Itchy hands    Social History   Socioeconomic History   Marital status: Married    Spouse name: [name]   Number of children: 2   Years of education: COLLEGE   Highest education level: Bachelor's degree (e.g., BA, AB, BS)  Occupational History   Occupation: RETIRED  Tobacco Use   Smoking status: Former    Current packs/day: 0.00    Types: Cigarettes    Quit date: 05/29/1982    Years since quitting: 41.6   Smokeless tobacco: Never  Vaping Use   Vaping status: Never Used  Substance and Sexual Activity   Alcohol use: Not Currently    Comment: 0-2 drinks a night   Drug use: No   Sexual activity: Not on file  Other Topics Concern   Not on file  Social History Narrative   Right handed   Caffeine   One story home lives with wife   retired   Chief Executive Officer Drivers of Corporate Investment Banker Strain: Low Risk  (01/08/2024)   Overall Financial Resource Strain (CARDIA)    Difficulty of Paying Living Expenses: Not hard at all  Food Insecurity: No Food Insecurity (01/08/2024)   Hunger Vital Sign    Worried About Running Out of Food in the Last Year: Never true    Ran Out of Food in the Last Year: Never true  Transportation Needs: No Transportation Needs (01/08/2024)   PRAPARE - Administrator, Civil Service (Medical): No    Lack of Transportation (Non-Medical): No  Physical Activity: Inactive (01/08/2024)   Exercise Vital Sign    Days of Exercise per Week: 0 days    Minutes of Exercise per Session: 0 min  Stress: No Stress Concern Present (01/08/2024)   [employer] of Occupational Health - Occupational Stress Questionnaire    Feeling of Stress : Not at all  Recent Concern: Stress - Stress Concern Present (12/19/2023)   [employer] of Occupational Health - Occupational Stress Questionnaire    Feeling of Stress : To some extent  Social Connections:  Moderately Isolated (01/08/2024)   Social Connection and Isolation Panel [NHANES]    Frequency of Communication with Friends and Family: More than three times a week    Frequency of Social Gatherings with Friends and Family: Once a week    Attends Religious Services: Never    Database Administrator or Organizations: No    Attends Banker Meetings: Never    Marital Status: Married    Vitals:  01/13/24 1008  BP: 118/66  Pulse: 60  Resp: 16  Temp: 98.4 F (36.9 C)  SpO2: 98%   Body mass index is 25.84 kg/m.  Physical Exam Vitals and nursing note reviewed.  Constitutional:      General: He is not in acute distress.    Appearance: He is well-developed.  HENT:     Head: Normocephalic and atraumatic.  Eyes:     Conjunctiva/sclera: Conjunctivae normal.  Cardiovascular:     Rate and Rhythm: Normal rate and regular rhythm.     Heart sounds: No murmur heard.    Comments: Lymphedema LE's. Peri ankle pitting edema. Pulmonary:     Effort: Pulmonary effort is normal. No respiratory distress.     Breath sounds: Normal breath sounds.  Skin:    General: Skin is warm.     Findings: No erythema or rash.       Neurological:     Mental Status: He is alert. Mental status is at baseline.     Comments: Unstable gait assisted with a cane.  Psychiatric:        Mood and Affect: Mood and affect normal.    ASSESSMENT AND PLAN:  Mr. Loftus was seen today for a left lower leg wound.   Bulla Occupying all anterior diameter of area above ankle. No signs of infection and intact surrounding skin. We discussed options, including draining/aspiration with a high gauge needed vs no intervention and monitoring for signs of complication. We reviewed risk of both options, I am concerned about risk of infection with draining lesion. We decided no intervention for now. Minimal compression bandage applied in office. Avoid trauma, monitor for signs of infection,and elevation of LE's. Keep  appt with wound clinic. Keep area moisturized with Vaseline.  Venous insufficiency (chronic) (peripheral) Has been complicated with venous ulcers. Compression stocking hard to put on. LE elevation above heart level a few times during the day. Walking for a few minutes through the day.  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 Following with neurologist.  I spent a total of 31 minutes in both face to face and non face to face activities for this visit on the date of this encounter. During this time history was obtained and documented, examination was performed, and assessment/plan discussed.  Return if symptoms worsen or fail to improve, for keep next appointment.  I, Leonce PARAS Wierda, acting as a scribe for Raciel Caffrey, MD., have documented all relevant documentation on the behalf of Elain Wixon, MD, as directed by  Mandolin Falwell, MD while in the presence of Makinzee Durley, MD.   I, Lovie Agresta, MD, have reviewed all documentation for this visit. The documentation on 01/16/24 for the exam, diagnosis, procedures, and orders are all accurate and complete.  Aleeha Boline G. Lynzy Rawles, MD  [facility]. [place] office.

## 2024-01-13 NOTE — Patient Instructions (Addendum)
 A few things to remember from today's visit:  Bulla  Venous insufficiency (chronic) (peripheral) Avoid trauma. Do not try to burt it or peel the skin. Keep appt with wound clinic. Elevation of lower extremities a few times per day for a few minutes.  If you need refills for medications you take chronically, please call your pharmacy. Do not use My Chart to request refills or for acute issues that need immediate attention. If you send a my chart message, it may take a few days to be addressed, specially if I am not in the office.  Please be sure medication list is accurate. If a new problem present, please set up appointment sooner than planned today.

## 2024-01-15 ENCOUNTER — Ambulatory Visit: Payer: Medicare Other | Attending: Cardiology

## 2024-01-15 DIAGNOSIS — Z5181 Encounter for therapeutic drug level monitoring: Secondary | ICD-10-CM | POA: Diagnosis not present

## 2024-01-15 DIAGNOSIS — D6859 Other primary thrombophilia: Secondary | ICD-10-CM | POA: Diagnosis not present

## 2024-01-15 LAB — POCT INR: INR: 3.4 — AB (ref 2.0–3.0)

## 2024-01-15 NOTE — Patient Instructions (Addendum)
 Description   Hold today only and then START taking 1/2 tablet daily EXCEPT 1 tablet on Thursdays.  Stay consistent with greens and ensure each week (decreased Ensure to 2 times per day) Recheck INR in 2 weeks.  Coumadin  Clinic [phone]

## 2024-01-18 ENCOUNTER — Ambulatory Visit: Payer: Medicare Other | Admitting: Internal Medicine

## 2024-01-18 ENCOUNTER — Ambulatory Visit: Payer: Self-pay | Admitting: Family Medicine

## 2024-01-18 ENCOUNTER — Telehealth: Payer: Self-pay | Admitting: Family Medicine

## 2024-01-18 DIAGNOSIS — R238 Other skin changes: Secondary | ICD-10-CM

## 2024-01-18 NOTE — Telephone Encounter (Signed)
 Copied from CRM [id number]. Topic: Clinical - Red Word Triage >> Jan 18, 2024 11:09 AM Lizabeth Riggs wrote: Red Word that prompted transfer to Nurse Triage: Wound has double in size since last week appointment. He is waking up yelling because of pain   Chief Complaint: worsening wound Symptoms: wound "full of fluid on point of bursting," "giant blister" filled with "yellow" fluid, wound doubled in size, some redness, pain with movement Frequency: continual Pertinent Negatives: Patient wife denies red streaking, "4+ edema in both fluids," severe pain at present, fever, SOB Disposition: [] 911 / [] ED /[] Urgent Care (no appt availability in office) / [x] Appointment(In office/virtual)/ []  [facility]/ [] Home Care/ [] Refused Recommended Disposition /[] [facility]/ []  Follow-up with PCP Additional Notes: Pt wife reporting pt recently seen by Dr. [place] for wound to left lower leg, over weekend "doubled in size, just gotten more fluid,no red streaks yet," "giant blister" filled with "yellow" fluid, "had been solid with no jiggling, now it is huge, if leg moves, can see it jiggling." Pt wife reporting pt hx of "severe venous stasis," significant cardiac hx, and "Alzheimers" with current "4+ edema in both legs, little baby blisters on both ankles." Pt wife confirms right now got him comfortable" but was yelling in pain last night. Wife confirms no fever, further weakness, chills, rash elsewhere, no SOB. Pt wife reporting "cannot get a wound appt," checked with multiple locations, "desperately need a referral to hold appt on next Monday with [place]" wound care. 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 Offered appt with other office today, no appts available with PCP  office, wife accepted, pt appt with [facility] office today, will call if worsening. Nurse sending [place] message to PCP office to request referral to wound care specialist - wife requesting phone call from PCP office regardless if can send referral or not. Please advise.  Reason for Disposition  [1] Looks infected (spreading redness, red streak) AND [2] no fever  Answer Assessment - Initial Assessment Questions 1. LOCATION: "Where is the wound located?"      Left lower leg 2. WOUND APPEARANCE: "What does the wound look like?"      Giant yellow blister filled with fluid 3. SIZE: If redness is present, ask: "What is the size of the red area?" (Inches, centimeters, or compare to size of a coin)      A little pink around the area but can't tell if leg or from tegederm that was on it 4. SPREAD: "What's changed in the last day?"  "Do you see any red streaks coming from the wound?"     Spread over the weekend, doubled in size. Just gotten more fluid, no red streaks yet 5. ONSET: "When did it start to look infected?"      Had been solid with no jiggling, now it is huge, if leg moves, can see it jiggling 6. MECHANISM: "How did the wound start, what was the cause?"     Severe venous stasis, 4+ edema in both legs, little baby blisters on both ankles 7. PAIN: Do you have any pain?"  If Yes, ask: "How bad is the pain?"  (e.g., Scale 1-10; mild, moderate, or severe)    - MILD (1-3): Doesn't interfere with normal activities.     -  MODERATE (4-7): Interferes with normal activities or awakens from sleep.    - SEVERE (8-10): Excruciating pain, unable to do any normal activities.       Yelling in pain last night, right now got him comfortable 8. FEVER: "Do you have a fever?" If Yes, ask: "What is your temperature, how was it measured, and when did it start?"     No 9. OTHER SYMPTOMS: "Do you have any other symptoms?" (e.g., shaking chills, weakness, rash elsewhere on body)     denies  Protocols used: Wound  Infection Suspected-A-AH

## 2024-01-18 NOTE — Telephone Encounter (Signed)
Copied from CRM [id number]. Topic: Clinical - Medical Advice >> Jan 18, 2024  8:26 AM Lennart Pall wrote: Reason for CRM: Patients leg wound doubled in size over the weekend- need referral [place] [facility] [phone] [name] is he scheduler they have an appointment available on 2/17. Does not want to speak to triage nurse. Patients wife wants an appointment as soon as possible. Please reach out to Patient wife

## 2024-01-18 NOTE — Telephone Encounter (Signed)
 I placed a call over to the wound care center to make sure that they received the referral I faxed. I had to leave a message, but asked for them to return my call if they did not receive it.

## 2024-01-18 NOTE — Telephone Encounter (Signed)
 Please send here asap

## 2024-01-18 NOTE — Telephone Encounter (Signed)
 I called and spoke with pt's spouse. She is requested the referral to the wound care center in [place] due to no sooner appointments around here. Referral has been entered as urgent & info faxed over to the office as requested. I also advised her that I would add on there for them to be put on the wait list incase something sooner comes open. Info faxed.

## 2024-01-19 NOTE — Telephone Encounter (Signed)
Patient is scheduled for 02/17

## 2024-01-20 ENCOUNTER — Encounter: Payer: Self-pay | Admitting: Physician Assistant

## 2024-01-20 ENCOUNTER — Ambulatory Visit (INDEPENDENT_AMBULATORY_CARE_PROVIDER_SITE_OTHER): Payer: Medicare Other | Admitting: Physician Assistant

## 2024-01-20 VITALS — BP 100/60 | HR 74 | Resp 18 | Ht 64.5 in

## 2024-01-20 DIAGNOSIS — F02818 Dementia in other diseases classified elsewhere, unspecified severity, with other behavioral disturbance: Secondary | ICD-10-CM | POA: Diagnosis not present

## 2024-01-20 DIAGNOSIS — G301 Alzheimer's disease with late onset: Secondary | ICD-10-CM

## 2024-01-20 DIAGNOSIS — F0280 Dementia in other diseases classified elsewhere without behavioral disturbance: Secondary | ICD-10-CM

## 2024-01-20 DIAGNOSIS — G309 Alzheimer's disease, unspecified: Secondary | ICD-10-CM

## 2024-01-20 MED ORDER — DIVALPROEX SODIUM 125 MG PO DR TAB
DELAYED_RELEASE_TABLET | ORAL | 3 refills | Status: DC
Start: 2024-01-20 — End: 2024-05-31

## 2024-01-20 MED ORDER — MEMANTINE HCL 10 MG PO TABS: 10.0000 mg | ORAL_TABLET | Freq: Two times a day (BID) | ORAL | 3 refills | Status: DC

## 2024-01-20 NOTE — Patient Instructions (Addendum)
It was a pleasure to see you today at our office.   Recommendations:  Follow up in  6 months   Depakote 125 mg Take 1 tab at night , may increase  to 2 tabs at night, may increase  morning dose  if needed   Memantine 10 mg tablets  1 tablet twice daily. Consider appointment with our social worker   Whom to call:  Memory  decline, memory medications: Call our office [phone]   For psychiatric meds, mood meds: Please have your primary care physician manage these medications.      For assessment of decision of mental capacity and competency:  Call Dr. Erick Blinks, geriatric psychiatrist at [phone]  For guidance in geriatric dementia issues please call [facility] [phone]  For guidance regarding [facility] and if placement were needed at the facility, contact Sidney Ace, Social Worker tel: [phone]  If you have any severe symptoms of a stroke, or other severe issues such as confusion,severe chills or fever, etc call 911 or go to the ER as you may need to be evaluated further       RECOMMENDATIONS FOR ALL PATIENTS WITH MEMORY PROBLEMS: 1. Continue to exercise (Recommend 30 minutes of walking everyday, or 3 hours every week) 2. Increase social interactions - continue going to [place] and enjoy social gatherings with friends and family 3. Eat healthy, avoid fried foods and eat more fruits and vegetables 4. Maintain adequate blood pressure, blood sugar, and blood cholesterol level. Reducing the risk of stroke and cardiovascular disease also helps promoting better memory. 5. Avoid stressful situations. Live a simple life and avoid aggravations. Organize your time and prepare for the next day in anticipation. 6. Sleep well, avoid any interruptions of sleep and avoid any distractions in the bedroom that may interfere with adequate sleep quality 7. Avoid sugar, avoid sweets as there is a strong link between excessive sugar intake,  diabetes, and cognitive impairment We discussed the Mediterranean diet, which has been shown to help patients reduce the risk of progressive memory disorders and reduces cardiovascular risk. This includes eating fish, eat fruits and green leafy vegetables, nuts like almonds and hazelnuts, walnuts, and also use olive oil. Avoid fast foods and fried foods as much as possible. Avoid sweets and sugar as sugar use has been linked to worsening of memory function.  There is always a concern of gradual progression of memory problems. If this is the case, then we may need to adjust level of care according to patient needs. Support, both to the patient and caregiver, should then be put into place.    The Alzheimer's Association is here all day, every day for people facing Alzheimer's disease through our free 24/7 Helpline: [phone]. The Helpline provides reliable information and support to all those who need assistance, such as individuals living with memory loss, Alzheimer's or other dementia, caregivers, health care professionals and the public.  Our highly trained and knowledgeable staff can help you with: Understanding memory loss, dementia and Alzheimer's  Medications and other treatment options  General information about aging and brain health  Skills to provide quality care and to find the best care from professionals  Legal, financial and living-arrangement decisions Our Helpline also features: Confidential care consultation provided by master's level clinicians who can help with decision-making support, crisis assistance and education on issues families face every day  Help in a caller's preferred language using our translation service that features more than 200 languages and  dialects  Referrals to local community programs, services and ongoing support     FALL PRECAUTIONS: Be cautious when walking. Scan the area for obstacles that may increase the risk of trips and falls. When getting up in  the mornings, sit up at the edge of the bed for a few minutes before getting out of bed. Consider elevating the bed at the head end to avoid drop of blood pressure when getting up. Walk always in a well-lit room (use night lights in the walls). Avoid area rugs or power cords from appliances in the middle of the walkways. Use a walker or a cane if necessary and consider physical therapy for balance exercise. Get your eyesight checked regularly.  FINANCIAL OVERSIGHT: Supervision, especially oversight when making financial decisions or transactions is also recommended.  HOME SAFETY: Consider the safety of the kitchen when operating appliances like stoves, microwave oven, and blender. Consider having supervision and share cooking responsibilities until no longer able to participate in those. Accidents with firearms and other hazards in the house should be identified and addressed as well.   ABILITY TO BE LEFT ALONE: If patient is unable to contact 911 operator, consider using LifeLine, or when the need is there, arrange for someone to stay with patients. Smoking is a fire hazard, consider supervision or cessation. Risk of wandering should be assessed by caregiver and if detected at any point, supervision and safe proof recommendations should be instituted.  MEDICATION SUPERVISION: Inability to self-administer medication needs to be constantly addressed. Implement a mechanism to ensure safe administration of the medications.   DRIVING: Regarding driving, in patients with progressive memory problems, driving will be impaired. We advise to have someone else do the driving if trouble finding directions or if minor accidents are reported. Independent driving assessment is available to determine safety of driving.   If you are interested in the driving assessment, you can contact the following:  [facility] [employer] in [place] [phone]  [facility] [phone]  [facility] [phone] or [phone]      Mediterranean Diet A Mediterranean diet refers to food and lifestyle choices that are based on the traditions of countries located on the [employer]. This way of eating has been shown to help prevent certain conditions and improve outcomes for people who have chronic diseases, like kidney disease and heart disease. What are tips for following this plan? Lifestyle  Cook and eat meals together with your family, when possible. Drink enough fluid to keep your urine clear or pale yellow. Be physically active every day. This includes: Aerobic exercise like running or swimming. Leisure activities like gardening, walking, or housework. Get 7-8 hours of sleep each night. If recommended by your health care provider, drink red wine in moderation. This means 1 glass a day for nonpregnant women and 2 glasses a day for men. A glass of wine equals 5 oz (150 mL). Reading food labels  Check the serving size of packaged foods. For foods such as rice and pasta, the serving size refers to the amount of cooked product, not dry. Check the total fat in packaged foods. Avoid foods that have saturated fat or trans fats. Check the ingredients list for added sugars, such as corn syrup. Shopping  At the grocery store, buy most of your food from the areas near the walls of the store. This includes: Fresh fruits and vegetables (produce). Grains, beans, nuts, and seeds. Some of these may be available in  unpackaged forms or large amounts (in bulk). Fresh seafood. Poultry and eggs. Low-fat dairy products. Buy whole ingredients instead of prepackaged foods. Buy fresh fruits and vegetables in-season from local farmers markets. Buy frozen fruits and vegetables in resealable bags. If you do not have access to quality fresh seafood, buy precooked frozen shrimp or canned fish, such as tuna, salmon, or sardines. Buy small amounts of raw or  cooked vegetables, salads, or olives from the deli or salad bar at your store. Stock your pantry so you always have certain foods on hand, such as olive oil, canned tuna, canned tomatoes, rice, pasta, and beans. Cooking  Cook foods with extra-virgin olive oil instead of using butter or other vegetable oils. Have meat as a side dish, and have vegetables or grains as your main dish. This means having meat in small portions or adding small amounts of meat to foods like pasta or stew. Use beans or vegetables instead of meat in common dishes like chili or lasagna. Experiment with different cooking methods. Try roasting or broiling vegetables instead of steaming or sauteing them. Add frozen vegetables to soups, stews, pasta, or rice. Add nuts or seeds for added healthy fat at each meal. You can add these to yogurt, salads, or vegetable dishes. Marinate fish or vegetables using olive oil, lemon juice, garlic, and fresh herbs. Meal planning  Plan to eat 1 vegetarian meal one day each week. Try to work up to 2 vegetarian meals, if possible. Eat seafood 2 or more times a week. Have healthy snacks readily available, such as: Vegetable sticks with hummus. Greek yogurt. Fruit and nut trail mix. Eat balanced meals throughout the week. This includes: Fruit: 2-3 servings a day Vegetables: 4-5 servings a day Low-fat dairy: 2 servings a day Fish, poultry, or lean meat: 1 serving a day Beans and legumes: 2 or more servings a week Nuts and seeds: 1-2 servings a day Whole grains: 6-8 servings a day Extra-virgin olive oil: 3-4 servings a day Limit red meat and sweets to only a few servings a month What are my food choices? Mediterranean diet Recommended Grains: Whole-grain pasta. Brown rice. Bulgar wheat. Polenta. Couscous. Whole-wheat bread. Orpah Cobb. Vegetables: Artichokes. Beets. Broccoli. Cabbage. Carrots. Eggplant. Green beans. Chard. Kale. Spinach. Onions. Leeks. Peas. Squash. Tomatoes.  Peppers. Radishes. Fruits: Apples. Apricots. Avocado. Berries. Bananas. Cherries. Dates. Figs. Grapes. Lemons. Melon. Oranges. Peaches. Plums. Pomegranate. Meats and other protein foods: Beans. Almonds. Sunflower seeds. Pine nuts. Peanuts. Cod. Salmon. Scallops. Shrimp. Tuna. Tilapia. Clams. Oysters. Eggs. Dairy: Low-fat milk. Cheese. Greek yogurt. Beverages: Water. Red wine. Herbal tea. Fats and oils: Extra virgin olive oil. Avocado oil. Grape seed oil. Sweets and desserts: [place] yogurt with honey. Baked apples. Poached pears. Trail mix. Seasoning and other foods: Basil. Cilantro. Coriander. Cumin. Mint. Parsley. Sage. Rosemary. Tarragon. Garlic. Oregano. Thyme. Pepper. Balsalmic vinegar. Tahini. Hummus. Tomato sauce. Olives. Mushrooms. Limit these Grains: Prepackaged pasta or rice dishes. Prepackaged cereal with added sugar. Vegetables: Deep fried potatoes (french fries). Fruits: Fruit canned in syrup. Meats and other protein foods: Beef. Pork. Lamb. Poultry with skin. Hot dogs. Tomasa Blase. Dairy: Ice cream. Sour cream. Whole milk. Beverages: Juice. Sugar-sweetened soft drinks. Beer. Liquor and spirits. Fats and oils: Butter. Canola oil. Vegetable oil. Beef fat (tallow). Lard. Sweets and desserts: Cookies. Cakes. Pies. Candy. Seasoning and other foods: Mayonnaise. Premade sauces and marinades. The items listed may not be a complete list. Talk with your dietitian about what dietary choices are right for you. Summary The Mediterranean diet  includes both food and lifestyle choices. Eat a variety of fresh fruits and vegetables, beans, nuts, seeds, and whole grains. Limit the amount of red meat and sweets that you eat. Talk with your health care provider about whether it is safe for you to drink red wine in moderation. This means 1 glass a day for nonpregnant women and 2 glasses a day for men. A glass of wine equals 5 oz (150 mL). This information is not intended to replace advice given to you by  your health care provider. Make sure you discuss any questions you have with your health care provider. Document Released: 07/17/2016 Document Revised: 08/19/2016 Document Reviewed: 07/17/2016 Elsevier Interactive Patient Education  2017 [employer].

## 2024-01-20 NOTE — Progress Notes (Signed)
Assessment/Plan:   Dementia likely due to Alzheimer's disease with behavioral disturbance  [name] is a very pleasant 78 y.o. RH male with a history of CAD status post CABG, osteoarthritis, hypertension, history of adenomatous polyps followed by GI, cataracts seen today in follow up for memory loss. Patient is currently on memantine 10 mg twice daily, tolerating well.  There is cognitive decline noted, he has more difficulty with long-term memory than prior.  At this moment, adding a new antidementia medication would not be indicated, as it would no longer be therapeutic.  He also has episodes of sundowning.  In the past, Depakote has been prescribed, but patient decided to throw it out before restarting it.  We discussed starting the medication again which could be helpful for sundowning, sleep, hallucinations.  Wife is interested in retrying it.   Follow up in  6 months. Continue memantine 10 mg twice daily.  Side effects discussed Start Depakote 125 mg at night, may add a second dose, total 250 mg at night.  May consider taking another extra dose of 125 mg during the day for hallucinations and other mood changes.  Recommend good control of her cardiovascular risk factors Continue to control mood as per PCP We discussed the role of social work in counseling for caregiver distress and wife will consider it.     Subjective:    This patient is accompanied in the office by his wife who supplements the history.  Previous records as well as any outside records available were reviewed prior to todays visit. Patient was last seen on 07/27/2023.   Any changes in memory since last visit? " It is worse". " 97 % of the time he does not know who I am, he is asking for his wife and when I tell him his knee, he does not believe me".  He has significant  difficulty remembering recent conversations and information and names.  He likes watching TCM but does not follow the programing anymore .he  likes to listen to music as before.  He does not elicit conversations, only responds.He needs more help with ADLs. repeats oneself?  Endorsed Disoriented when walking into a room? Does not recognize  his house.   Leaving objects?  May misplace things especially in the kitchen, does not remember where he left them, they are "all over the house"-wife says.  Wandering behavior?  "I can't leave him now, he wonders at 3 am to a neighbor's house".  "He cannot wait in the car if I need to go somewhere, he wants to get out of the car ".  "I can't want to ask for help because of his bathroom issues. Our son comes and calls every night to check on him and that calms him down " Any personality changes since last visit?  "Sometimes he becomes frustrated when he knows that there is something wrong with his memory " Any worsening depression?: "There is more confabulation, frustration" Hallucinations or paranoia?  At times he feels that there is someone else living the house, sometimes he sees different people, relatives or crowds. Seizures? denies    Any sleep changes? "Different" has the days and nights reversed according to wife.  Denies vivid dreams, may have some REM behavior, denies sleepwalking   Sleep apnea?   Denies.   Any hygiene concerns? He refuses to shower but does okay with sponge bath. Independent of bathing and dressing?  He needs assistance by his wife. Does the patient needs help  with medications?  Wife is in charge Who is in charge of the finances?  Wife is in charge Any changes in appetite? Excellent.  "He forgets that he ate and eats again "    Patient have trouble swallowing? Denies.   Does the patient cook? No Any headaches?   denies   Chronic back pain endorsed, sees chiropractor weekly.  Ambulates with difficulty?  He needs a walker  to ambulate for stability . Goes to the foot clinic due to LLE blisters, gets pedicure once  a month Recent falls or head injuries?  He had a mechanical  fall in December 2024 after hitting the coffee table, with no head trauma or LOC.  Pain was mostly on the hip but he was released the same day without any fractures or any other abnormalities. Unilateral weakness, numbness or tingling? denies   Any tremors?  Denies   Any anosmia?  Denies   Any incontinence of urine?  Sometimes unable to know if there is an urge, wears diapers Any bowel dysfunction?  He is on Miralax daily, had impaction on December.      Patient lives with his wife Does the patient drive? No longer drives    HISTORY 12/06/2020: His wife has noted memory problems since 2020 that has gradually progressed.  He started having trouble calculating the checkbook and would be off by several hundreds of dollars.  He also quickly forgets conversations and would repeat questions after just minutes.  He hasn't driven for years but as a passenger he denies disorientation on familiar routes. On a recent cruise, he had trouble keeping up with the schedule and activities.  He has a system to remember to take his medications independently.  No difficulty with performing home chores or activities.  At first, it was thought to be related to his chronic back/radicular pain but now the memory problems appear to be out of proportion to being in pain.     He has family history of Alzheimer's disease on his mother's side, including his mother, aunts, uncles and grandmother.  PREVIOUS MEDICATIONS:   CURRENT MEDICATIONS:  Outpatient Encounter Medications as of 01/20/2024  Medication Sig   amLODipine (NORVASC) 5 MG tablet TAKE ONE AND ONE-HALF TABLETS DAILY (Patient taking differently: Take 5 mg by mouth daily.)   cholecalciferol (VITAMIN D) 1000 units tablet Take 1,000 Units by mouth daily.    Cyanocobalamin (VITAMIN B12) 500 MCG TABS Take 500 mcg by mouth daily.    ezetimibe (ZETIA) 10 MG tablet TAKE 1 TABLET AT BEDTIME   fenofibrate (TRICOR) 145 MG tablet TAKE 1 TABLET AT BEDTIME   finasteride  (PROSCAR) 5 MG tablet Take 5 mg by mouth daily.   furosemide (LASIX) 40 MG tablet Take 1 tablet (40 mg total) by mouth every other day.   LORazepam (ATIVAN) 0.5 MG tablet Take 0.5 tablets (0.25 mg total) by mouth 2 (two) times daily as needed for anxiety.   losartan (COZAAR) 50 MG tablet Take 1 tablet (50 mg total) by mouth daily.   memantine (NAMENDA) 10 MG tablet TAKE 1 TABLET TWICE A DAY   naloxegol oxalate (MOVANTIK) 25 MG TABS tablet Take 1 tablet (25 mg total) by mouth daily.   nitroGLYCERIN (NITROSTAT) 0.4 MG SL tablet Place 1 tablet (0.4 mg total) under the tongue every 5 (five) minutes as needed for chest pain.   ranolazine (RANEXA) 500 MG 12 hr tablet Take 1 tablet (500 mg total) by mouth 2 (two) times daily.   rosuvastatin (  CRESTOR) 40 MG tablet TAKE 1 TABLET DAILY   tamsulosin (FLOMAX) 0.4 MG CAPS capsule Take 0.4 mg by mouth daily after supper.   traMADol (ULTRAM) 50 MG tablet Take 1 tablet (50 mg total) by mouth every 12 (twelve) hours as needed.   warfarin (COUMADIN) 5 MG tablet TAKE AS DIRECTED BY COUMADIN CLINIC (Patient taking differently: Take 5 mg by mouth See admin instructions. Take 2.5 mg on Mon,Wed,Fri,Sat,and Sun. 5 mg on Tue and Thu.)   No facility-administered encounter medications on file as of 01/20/2024.       07/17/2022   12:00 PM 03/23/2018   11:49 AM  MMSE - Mini Mental State Exam  Not completed:  --  Orientation to time 0   Orientation to Place 4   Registration 3   Attention/ Calculation 0   Recall 2   Language- name 2 objects 2   Language- repeat 1   Language- follow 3 step command 3   Language- read & follow direction 1   Write a sentence 0   Copy design 0   Total score 16       10/11/2021    1:00 PM  Montreal Cognitive Assessment   Visuospatial/ Executive (0/5) 0  Naming (0/3) 2  Attention: Read list of digits (0/2) 2  Attention: Read list of letters (0/1) 1  Attention: Serial 7 subtraction starting at 100 (0/3) 2  Language: Repeat phrase  (0/2) 0  Language : Fluency (0/1) 1  Abstraction (0/2) 2  Delayed Recall (0/5) 2  Orientation (0/6) 5  Total 17  Adjusted Score (based on education) 17    Objective:     PHYSICAL EXAMINATION:    VITALS:  There were no vitals filed for this visit.  GEN:  The patient appears stated age and is in NAD. HEENT:  Normocephalic, atraumatic.   Neurological examination:  General: NAD, well-groomed, appears stated age. Orientation: The patient is alert.  Not oriented to person, place and date Cranial nerves: There is good facial symmetry.The speech is fluent and clear, tangential at times. No aphasia or dysarthria. Fund of knowledge is reduced. Recent and remote memory are impaired. Attention and concentration are reduced.  Able to name objects and unable to repeat phrases.  Hearing is intact to conversational tone.   Sensation: Sensation is intact to light touch throughout Motor: Strength is at least antigravity x4. DTR's 2/4 in UE/LE     Movement examination: Tone: There is normal tone in the UE/LE Abnormal movements:  no tremor.  No myoclonus.  No asterixis.   Coordination:  There is some decremation with RAM's on the left.  Abnormal finger to nose  Gait and Station: The patient has difficulty arising out of a deep-seated chair without the use of the hands.  He has profound kyphosis.  The patient's stride length is short, pace is slow.  Gait is cautious and wide based.   Thank you for allowing [place] the opportunity to participate in the care of this nice patient. Please do not hesitate to contact us for any questions or concerns.   Total time spent on today's visit was 36 minutes dedicated to this patient today, preparing to see patient, examining the patient, ordering tests and/or medications and counseling the patient, documenting clinical information in the EHR or other health record, independently interpreting results and communicating results to the patient/family, discussing  treatment and goals, answering patient's questions and coordinating care.  Cc:  [place], Betty G, MD  Marlowe Kays 01/20/2024 6:06  AM

## 2024-01-25 DIAGNOSIS — F0280 Dementia in other diseases classified elsewhere without behavioral disturbance: Secondary | ICD-10-CM | POA: Diagnosis not present

## 2024-01-25 DIAGNOSIS — G309 Alzheimer's disease, unspecified: Secondary | ICD-10-CM | POA: Diagnosis not present

## 2024-01-25 DIAGNOSIS — L97822 Non-pressure chronic ulcer of other part of left lower leg with fat layer exposed: Secondary | ICD-10-CM | POA: Diagnosis not present

## 2024-01-25 DIAGNOSIS — I872 Venous insufficiency (chronic) (peripheral): Secondary | ICD-10-CM | POA: Diagnosis not present

## 2024-01-25 DIAGNOSIS — R600 Localized edema: Secondary | ICD-10-CM | POA: Diagnosis not present

## 2024-01-26 ENCOUNTER — Telehealth: Payer: Self-pay | Admitting: Family Medicine

## 2024-01-26 ENCOUNTER — Telehealth (INDEPENDENT_AMBULATORY_CARE_PROVIDER_SITE_OTHER): Payer: Medicare Other | Admitting: Family Medicine

## 2024-01-26 ENCOUNTER — Encounter: Payer: Self-pay | Admitting: Family Medicine

## 2024-01-26 VITALS — Ht 64.5 in

## 2024-01-26 DIAGNOSIS — L97822 Non-pressure chronic ulcer of other part of left lower leg with fat layer exposed: Secondary | ICD-10-CM

## 2024-01-26 DIAGNOSIS — I872 Venous insufficiency (chronic) (peripheral): Secondary | ICD-10-CM | POA: Diagnosis not present

## 2024-01-26 MED ORDER — OXYCODONE-ACETAMINOPHEN 5-325 MG PO TABS: 1.0000 | ORAL_TABLET | Freq: Three times a day (TID) | ORAL | 0 refills | Status: DC | PRN

## 2024-01-26 NOTE — Telephone Encounter (Signed)
I called and spoke with patient's wife. Patient is having a lot of pain from the debredment from his leg wound. He saw wound care yesterday and that was when this was done. They are deferring pain meds to his PCP. Pt's wife mentioned that he won't let her near the wound since he is in so much pain. She has been giving him the Tramadol as prescribed, but it is not helping him. She wanted to know if it can be increased or if he may need something stronger. I advised her I would speak with PCP when she comes out of the room and see what else can be done. Pt's wife verbalized understanding.

## 2024-01-26 NOTE — Progress Notes (Signed)
Virtual Visit via Video Note I connected with Josede Cicero Rindfleisch on 01/26/2024 by a video enabled telemedicine application and verified that I am speaking with the correct person using two identifiers. Location patient: home Location provider:work office Persons participating in the virtual visit: patient, provider, scribe  I discussed the limitations of evaluation and management by telemedicine and the availability of in person appointments. The patient expressed understanding and agreed to proceed.  Chief Complaint  Patient presents with   Medication Consultation   HPI: Mr. MAK BONNY is a 78 y.o. male with a PMHx significant for CAD, HTN, Alzheimer's disease, HLD, and chronic pain, who is being seen on video today with his wife to discuss pain management. His wife provides most of Hx.  He was last seen here in the office on 01/13/24,at this time he had a big serous fluid blister pretibial area above left ankle. Blister started liking, intense pain.  Evaluated at the wound clinic yesterday, blister was excised, including slough, dermis and epidermis.  Having severe pain, constant on affected area. His wife says the pain is like electric shocks shooting through his leg and foot. The pain is not related to his position or the time of day. Sometimes occurs at night.  Pain management advised wife to change the dressing daily, but this has been difficult because of the pain.  Negative for fever, chills, change sin appetite, or wound drainage.  He is on Tramadol for chronic pain, which is not helping. Has developed severe constipation in the past. He is taking Movantik to prevent constipation.   ROS: See pertinent positives and negatives per HPI.  Past Medical History:  Diagnosis Date   Allergy    Arthritis    CAD (coronary artery disease) 1996   POST CABG   Cataracts, bilateral    Chicken pox    Dyslipidemia    Hyperlipidemia    Hypertension    Sinus bradycardia     Past  Surgical History:  Procedure Laterality Date   BYPASS GRAFT  1996   CATARACT EXTRACTION Right    COLONOSCOPY WITH PROPOFOL N/A 11/20/2022   Procedure: COLONOSCOPY WITH PROPOFOL;  Surgeon: Beverley Fiedler, MD;  Location: [facility] ENDOSCOPY;  Service: Gastroenterology;  Laterality: N/A;   INGUINAL HERNIA REPAIR Right 04/09/2020   Procedure: LAPAROSCOPIC RIGHT INGUINAL HERNIA REPAIR WITH MESH;  Surgeon: Axel Filler, MD;  Location: [facility] OR;  Service: General;  Laterality: Right;   POLYPECTOMY  11/20/2022   Procedure: POLYPECTOMY;  Surgeon: Beverley Fiedler, MD;  Location: [facility] ENDOSCOPY;  Service: Gastroenterology;;   REFRACTIVE SURGERY Right    SKIN BIOPSY     head x 2   UMBILICAL HERNIA REPAIR  2003    Family History  Problem Relation Age of Onset   Alzheimer's disease Mother    Cancer Father        prostate   Prostate cancer Father    AAA (abdominal aortic aneurysm) Sister    Hypertension Maternal Grandmother    Hypertension Paternal Grandmother    Colon cancer Other        great uncle   Esophageal cancer Neg Hx    Pancreatic cancer Neg Hx    Liver disease Neg Hx    Stomach cancer Neg Hx     Social History   Socioeconomic History   Marital status: Married    Spouse name: [name]   Number of children: 2   Years of education: COLLEGE   Highest education level: Bachelor's degree (e.g.,  BA, AB, BS)  Occupational History   Occupation: RETIRED  Tobacco Use   Smoking status: Former    Current packs/day: 0.00    Types: Cigarettes    Quit date: 05/29/1982    Years since quitting: 41.6   Smokeless tobacco: Never  Vaping Use   Vaping status: Never Used  Substance and Sexual Activity   Alcohol use: Not Currently    Comment: 0-2 drinks a night   Drug use: No   Sexual activity: Not on file  Other Topics Concern   Not on file  Social History Narrative   Right handed   Caffeine   One story home lives with wife   retired   Chief Executive Officer Drivers of Corporate investment banker Strain: Low  Risk  (01/08/2024)   Overall Financial Resource Strain (CARDIA)    Difficulty of Paying Living Expenses: Not hard at all  Food Insecurity: No Food Insecurity (01/08/2024)   Hunger Vital Sign    Worried About Running Out of Food in the Last Year: Never true    Ran Out of Food in the Last Year: Never true  Transportation Needs: No Transportation Needs (01/08/2024)   PRAPARE - Administrator, Civil Service (Medical): No    Lack of Transportation (Non-Medical): No  Physical Activity: Inactive (01/08/2024)   Exercise Vital Sign    Days of Exercise per Week: 0 days    Minutes of Exercise per Session: 0 min  Stress: No Stress Concern Present (01/08/2024)   [employer] of Occupational Health - Occupational Stress Questionnaire    Feeling of Stress : Not at all  Recent Concern: Stress - Stress Concern Present (12/19/2023)   [employer] of Occupational Health - Occupational Stress Questionnaire    Feeling of Stress : To some extent  Social Connections: Moderately Isolated (01/08/2024)   Social Connection and Isolation Panel [NHANES]    Frequency of Communication with Friends and Family: More than three times a week    Frequency of Social Gatherings with Friends and Family: Once a week    Attends Religious Services: Never    Database administrator or Organizations: No    Attends Banker Meetings: Never    Marital Status: Married  Catering manager Violence: Not At Risk (01/08/2024)   Humiliation, Afraid, Rape, and Kick questionnaire    Fear of Current or Ex-Partner: No    Emotionally Abused: No    Physically Abused: No    Sexually Abused: No     Current Outpatient Medications:    amLODipine (NORVASC) 5 MG tablet, TAKE ONE AND ONE-HALF TABLETS DAILY (Patient taking differently: Take 5 mg by mouth daily.), Disp: 135 tablet, Rfl: 3   cholecalciferol (VITAMIN D) 1000 units tablet, Take 1,000 Units by mouth daily. , Disp: , Rfl:    Cyanocobalamin (VITAMIN B12) 500  MCG TABS, Take 500 mcg by mouth daily. , Disp: , Rfl:    divalproex (DEPAKOTE) 125 MG DR tablet, Take 1 tab at night for 1 week, then increase to 2 tablets at night May take it in the morning if needed, Disp: 180 tablet, Rfl: 3   donepezil (ARICEPT) 10 MG tablet, Take 10 mg by mouth daily., Disp: , Rfl:    ezetimibe (ZETIA) 10 MG tablet, TAKE 1 TABLET AT BEDTIME, Disp: 90 tablet, Rfl: 3   fenofibrate (TRICOR) 145 MG tablet, TAKE 1 TABLET AT BEDTIME, Disp: 90 tablet, Rfl: 3   finasteride (PROSCAR) 5 MG tablet, Take 5 mg by  mouth daily., Disp: , Rfl:    LORazepam (ATIVAN) 0.5 MG tablet, Take 0.5 tablets (0.25 mg total) by mouth 2 (two) times daily as needed for anxiety., Disp: 30 tablet, Rfl: 0   losartan (COZAAR) 50 MG tablet, Take 1 tablet (50 mg total) by mouth daily., Disp: 90 tablet, Rfl: 2   memantine (NAMENDA) 10 MG tablet, Take 1 tablet (10 mg total) by mouth 2 (two) times daily., Disp: 180 tablet, Rfl: 3   naloxegol oxalate (MOVANTIK) 25 MG TABS tablet, Take 1 tablet (25 mg total) by mouth daily., Disp: 30 tablet, Rfl: 1   nitroGLYCERIN (NITROSTAT) 0.4 MG SL tablet, Place 1 tablet (0.4 mg total) under the tongue every 5 (five) minutes as needed for chest pain., Disp: 75 tablet, Rfl: 2   oxyCODONE-acetaminophen (PERCOCET/ROXICET) 5-325 MG tablet, Take 1 tablet by mouth every 8 (eight) hours as needed for up to 15 days for severe pain (pain score 7-10)., Disp: 21 tablet, Rfl: 0   ranolazine (RANEXA) 500 MG 12 hr tablet, Take 1 tablet (500 mg total) by mouth 2 (two) times daily., Disp: 60 tablet, Rfl: 0   rosuvastatin (CRESTOR) 40 MG tablet, TAKE 1 TABLET DAILY, Disp: 90 tablet, Rfl: 3   tamsulosin (FLOMAX) 0.4 MG CAPS capsule, Take 0.4 mg by mouth daily after supper., Disp: , Rfl:    warfarin (COUMADIN) 5 MG tablet, TAKE AS DIRECTED BY COUMADIN CLINIC (Patient taking differently: Take 5 mg by mouth See admin instructions. Take 2.5 mg on Mon,Wed,Fri,Sat,and Sun. 5 mg on Tue and Thu.), Disp: 80  tablet, Rfl: 3   furosemide (LASIX) 40 MG tablet, Take 1 tablet (40 mg total) by mouth every other day., Disp: 90 tablet, Rfl: 3  EXAM:  VITALS per patient if applicable:Ht 5' 4.5" (1.638 m)   BMI 25.84 kg/m   GENERAL: alert, oriented, appears well and in no acute distress  HEENT: atraumatic, conjunctiva clear, no obvious abnormalities on inspection of external nose and ears  LUNGS: on inspection no signs of respiratory distress, breathing rate appears normal, no obvious gross SOB, gasping or wheezing  CV: no obvious cyanosis  SKIN: Picture on his wife's phone: Wound around anterior distal RLE and a small blister intact.No purulent drainage appreciated.  PSYCH/NEURO: pleasant and cooperative, no obvious depression or anxiety.  ASSESSMENT AND PLAN:  Discussed the following assessment and plan:  Venous stasis ulcer of other part of left lower leg with fat layer exposed without varicose veins (HCC) - Plan: oxyCODONE-acetaminophen (PERCOCET/ROXICET) 5-325 MG tablet Severe pain of left lower extremity after wound debridement done yesterday. He is on tramadol for chronic pain management, which is not helping with this pain. We will hold Tramadol for now and start Percocet 5-325 mg 3 times daily as needed. We discussed some side effects, including worsening constipation and the risk of med interaction with Lorazepam. Continue Movantik 25 mg daily to prevent constipation.  Recommend taking Percocet 5-325 mg 30 minutes before dressing change. LE elevation a few times through the day. Monitor for signs of infection. He has an appointment at the wound clinic for follow-up next week.  We discussed possible serious and likely etiologies, options for evaluation and workup, limitations of telemedicine visit vs in person visit, treatment, treatment risks and precautions. The patient was advised to call back or seek an in-person evaluation if the symptoms worsen or if the condition fails to  improve as anticipated. I discussed the assessment and treatment plan with the patient. The patient was provided an opportunity  to ask questions and all were answered. The patient agreed with the plan and demonstrated an understanding of the instructions.  Return if symptoms worsen or fail to improve, for keep next appointment.  I, Rolla Etienne Wierda, acting as a scribe for Ameriah Lint [place], MD., have documented all relevant documentation on the behalf of Kavi Almquist [place], MD, as directed by  Masiah Lewing [place], MD while in the presence of Roxsana Riding [place], MD.   I, Clarity Ciszek [place], MD, have reviewed all documentation for this visit. The documentation on 01/26/24 for the exam, diagnosis, procedures, and orders are all accurate and complete.  Maya Arcand [place], MD

## 2024-01-26 NOTE — Telephone Encounter (Signed)
Patient wife called in regarding patient being in extreme pain due to a issue he has going on on his foot patient wife would like a call back regarding this issue

## 2024-01-26 NOTE — Telephone Encounter (Signed)
Mychart video visit scheduled for this afternoon at 4:30 to discuss medication with PCP.

## 2024-01-27 ENCOUNTER — Ambulatory Visit: Payer: Medicare Other | Admitting: Physician Assistant

## 2024-01-29 ENCOUNTER — Ambulatory Visit: Payer: Medicare Other | Attending: Interventional Cardiology

## 2024-01-29 DIAGNOSIS — Z5181 Encounter for therapeutic drug level monitoring: Secondary | ICD-10-CM | POA: Insufficient documentation

## 2024-01-29 DIAGNOSIS — D6859 Other primary thrombophilia: Secondary | ICD-10-CM | POA: Diagnosis not present

## 2024-01-29 LAB — POCT INR: INR: 3.1 — AB (ref 2.0–3.0)

## 2024-01-29 NOTE — Patient Instructions (Addendum)
Description   Eat a serving of greens today and continue taking 1/2 tablet daily EXCEPT 1 tablet on Thursdays.  Stay consistent with greens and ensure each week (decreased Ensure to 2 times per day) Recheck INR in 4 weeks.  Coumadin Clinic [phone]

## 2024-01-31 ENCOUNTER — Encounter (HOSPITAL_COMMUNITY): Payer: Self-pay

## 2024-01-31 ENCOUNTER — Emergency Department (HOSPITAL_COMMUNITY)
Admission: EM | Admit: 2024-01-31 | Discharge: 2024-01-31 | Disposition: A | Discharge status: Home / Self Care | Payer: Medicare Other | Attending: Emergency Medicine | Admitting: Emergency Medicine

## 2024-01-31 ENCOUNTER — Emergency Department (HOSPITAL_COMMUNITY): Payer: Medicare Other

## 2024-01-31 ENCOUNTER — Other Ambulatory Visit: Payer: Self-pay

## 2024-01-31 DIAGNOSIS — I2510 Atherosclerotic heart disease of native coronary artery without angina pectoris: Secondary | ICD-10-CM | POA: Insufficient documentation

## 2024-01-31 DIAGNOSIS — I10 Essential (primary) hypertension: Secondary | ICD-10-CM | POA: Diagnosis not present

## 2024-01-31 DIAGNOSIS — Z951 Presence of aortocoronary bypass graft: Secondary | ICD-10-CM | POA: Diagnosis not present

## 2024-01-31 DIAGNOSIS — Z91040 Latex allergy status: Secondary | ICD-10-CM | POA: Insufficient documentation

## 2024-01-31 DIAGNOSIS — I499 Cardiac arrhythmia, unspecified: Secondary | ICD-10-CM | POA: Diagnosis not present

## 2024-01-31 DIAGNOSIS — F0390 Unspecified dementia without behavioral disturbance: Secondary | ICD-10-CM | POA: Diagnosis not present

## 2024-01-31 DIAGNOSIS — I771 Stricture of artery: Secondary | ICD-10-CM | POA: Diagnosis not present

## 2024-01-31 DIAGNOSIS — R0789 Other chest pain: Secondary | ICD-10-CM | POA: Insufficient documentation

## 2024-01-31 DIAGNOSIS — I517 Cardiomegaly: Secondary | ICD-10-CM | POA: Diagnosis not present

## 2024-01-31 DIAGNOSIS — Z79899 Other long term (current) drug therapy: Secondary | ICD-10-CM | POA: Diagnosis not present

## 2024-01-31 DIAGNOSIS — R079 Chest pain, unspecified: Secondary | ICD-10-CM | POA: Diagnosis not present

## 2024-01-31 DIAGNOSIS — M40204 Unspecified kyphosis, thoracic region: Secondary | ICD-10-CM | POA: Diagnosis not present

## 2024-01-31 DIAGNOSIS — R072 Precordial pain: Secondary | ICD-10-CM | POA: Diagnosis present

## 2024-01-31 DIAGNOSIS — I959 Hypotension, unspecified: Secondary | ICD-10-CM | POA: Diagnosis not present

## 2024-01-31 DIAGNOSIS — R001 Bradycardia, unspecified: Secondary | ICD-10-CM | POA: Diagnosis not present

## 2024-01-31 LAB — BASIC METABOLIC PANEL
Anion gap: 10 (ref 5–15)
BUN: 32 mg/dL — ABNORMAL HIGH (ref 8–23)
CO2: 27 mmol/L (ref 22–32)
Calcium: 10.9 mg/dL — ABNORMAL HIGH (ref 8.9–10.3)
Chloride: 110 mmol/L (ref 98–111)
Creatinine, Ser: 1.93 mg/dL — ABNORMAL HIGH (ref 0.61–1.24)
GFR, Estimated: 35 mL/min — ABNORMAL LOW (ref >60)
Glucose, Bld: 118 mg/dL — ABNORMAL HIGH (ref 70–99)
Potassium: 4 mmol/L (ref 3.5–5.1)
Sodium: 147 mmol/L — ABNORMAL HIGH (ref 135–145)

## 2024-01-31 LAB — CBC
HCT: 27.9 % — ABNORMAL LOW (ref 39.0–52.0)
Hemoglobin: 9.2 g/dL — ABNORMAL LOW (ref 13.0–17.0)
MCH: 33.6 pg (ref 26.0–34.0)
MCHC: 33 g/dL (ref 30.0–36.0)
MCV: 101.8 fL — ABNORMAL HIGH (ref 80.0–100.0)
Platelets: 207 10*3/uL (ref 150–400)
RBC: 2.74 MIL/uL — ABNORMAL LOW (ref 4.22–5.81)
RDW: 15.2 % (ref 11.5–15.5)
WBC: 5.2 10*3/uL (ref 4.0–10.5)
nRBC: 0 % (ref 0.0–0.2)

## 2024-01-31 LAB — TROPONIN I (HIGH SENSITIVITY)
Troponin I (High Sensitivity): 12 ng/L (ref <18)
Troponin I (High Sensitivity): 10 ng/L (ref <18)

## 2024-01-31 MED ORDER — LACTATED RINGERS IV BOLUS
1000.0000 mL | Freq: Once | INTRAVENOUS | Status: AC
  Administered 2024-01-31: 1000 mL via INTRAVENOUS

## 2024-01-31 NOTE — Discharge Instructions (Signed)
 Your lab work today showed slightly elevated sodium, you should make sure you are drinking plenty of water and follow-up closely with your doctor later this week to have this rechecked in the next 2 to 3 days.  You should also call your cardiologist to schedule follow-up for your chest pain.  If you develop recurrent chest pain, difficulty breathing, fainting or any other new concerning symptoms you should return to the ED.

## 2024-01-31 NOTE — ED Triage Notes (Signed)
 Pt from home with ems c.o chest pain that started around 630 this morning, wife gave pt 2 nitroglycerins prior to EMS arrival. Pt also received 1 nitroglycerin and 324 ASA by EMS. Pt alert, oriented x2 at baseline.

## 2024-01-31 NOTE — ED Provider Triage Note (Signed)
 Emergency Medicine Provider Triage Evaluation Note  [name] , a 78 y.o. male  was evaluated in triage.  Pt complains of chest pain.  Patient reportedly woke up around 630 this morning with chest pain.  Patient has a history of Alzheimer's and does not recall this episode.  He cannot tell me exactly if the pain was radiating.  Currently asymptomatic.  Patient reportedly received 2 doses of nitroglycerin at home and then with EMS.  Patient also received aspirin with EMS.  Currently asymptomatic.  Review of Systems  Positive: As above Negative: As above  Physical Exam  BP 101/63 (BP Location: Right Arm)   Pulse (!) 53   Temp 99.1 F (37.3 C) (Oral)   Resp 18   SpO2 98%  Gen:   Awake, no distress   Resp:  Normal effort  MSK:   Moves extremities without difficulty  Other:    Medical Decision Making  Medically screening exam initiated at 10:06 AM.  Appropriate orders placed.  [name] was informed that the remainder of the evaluation will be completed by another provider, this initial triage assessment does not replace that evaluation, and the importance of remaining in the ED until their evaluation is complete.     [name], PA-C 01/31/24 1007

## 2024-01-31 NOTE — ED Provider Notes (Signed)
 [place] EMERGENCY DEPARTMENT AT [facility] Provider Note   CSN: [id number] Arrival date & time: 01/31/24  [id number]     History  Chief Complaint  Patient presents with   Chest Pain    [name] is a 78 y.o. male.   Chest Pain 78 year old male history of dementia, hypertension, hyperlipidemia, CAD status post CABG in 1996 presenting for chest pain.  Patient is here with his wife.  Patient does not remember the episode today due to dementia.  However wife states around 6 AM he woke up with some intermittent substernal nonradiating sharp chest pain.  He had pain like this before that was attributed to musculoskeletal pain.  Pain lasted for half hour or so and then resolved.  He did not have any shortness of breath.  He received nitroglycerin without any change.  Currently he is asymptomatic.  No chest pain or dizziness or palpitations or shortness of breath.  He feels well.  He is on Coumadin and has not missed any doses.  No history of PE.  Looks like he last saw cardiology in September, at that point they put him on amlodipine and Ranexa for antianginal medications.  Unfortunately he is not able to lay flat on a table, so they felt he could not do a stress test or repeat heart cath.     Home Medications Prior to Admission medications   Medication Sig Start Date End Date Taking? Authorizing Provider  amLODipine (NORVASC) 5 MG tablet TAKE ONE AND ONE-HALF TABLETS DAILY Patient taking differently: Take 5 mg by mouth daily. 01/21/23   Corky Crafts, MD  cholecalciferol (VITAMIN D) 1000 units tablet Take 1,000 Units by mouth daily.     [provider]  Cyanocobalamin (VITAMIN B12) 500 MCG TABS Take 500 mcg by mouth daily.     [provider]  divalproex (DEPAKOTE) 125 MG DR tablet Take 1 tab at night for 1 week, then increase to 2 tablets at night May take it in the morning if needed 01/20/24   Marcos Eke, PA-C  donepezil (ARICEPT) 10 MG tablet  Take 10 mg by mouth daily. 11/24/23   [provider]  ezetimibe (ZETIA) 10 MG tablet TAKE 1 TABLET AT BEDTIME 05/18/23   Corky Crafts, MD  fenofibrate (TRICOR) 145 MG tablet TAKE 1 TABLET AT BEDTIME 03/31/23   Corky Crafts, MD  finasteride (PROSCAR) 5 MG tablet Take 5 mg by mouth daily. 06/12/23   [provider]  furosemide (LASIX) 40 MG tablet Take 1 tablet (40 mg total) by mouth every other day. 10/20/23 01/20/24  Marcelino Duster, PA  LORazepam (ATIVAN) 0.5 MG tablet Take 0.5 tablets (0.25 mg total) by mouth 2 (two) times daily as needed for anxiety. 10/21/23   [place], Betty G, MD  losartan (COZAAR) 50 MG tablet Take 1 tablet (50 mg total) by mouth daily. 12/23/23   Duke, Roe Rutherford, PA  memantine (NAMENDA) 10 MG tablet Take 1 tablet (10 mg total) by mouth 2 (two) times daily. 01/20/24   Marcos Eke, PA-C  naloxegol oxalate (MOVANTIK) 25 MG TABS tablet Take 1 tablet (25 mg total) by mouth daily. 09/03/23   Pyrtle, Carie Caddy, MD  nitroGLYCERIN (NITROSTAT) 0.4 MG SL tablet Place 1 tablet (0.4 mg total) under the tongue every 5 (five) minutes as needed for chest pain. 01/24/22   Corky Crafts, MD  oxyCODONE-acetaminophen (PERCOCET/ROXICET) 5-325 MG tablet Take 1 tablet by mouth every 8 (eight) hours  as needed for up to 15 days for severe pain (pain score 7-10). 01/26/24 02/10/24  [place], Betty G, MD  ranolazine (RANEXA) 500 MG 12 hr tablet Take 1 tablet (500 mg total) by mouth 2 (two) times daily. 10/20/23   Marcelino Duster, PA  rosuvastatin (CRESTOR) 40 MG tablet TAKE 1 TABLET DAILY 05/18/23   Corky Crafts, MD  tamsulosin (FLOMAX) 0.4 MG CAPS capsule Take 0.4 mg by mouth daily after supper.    [provider]  warfarin (COUMADIN) 5 MG tablet TAKE AS DIRECTED BY COUMADIN CLINIC Patient taking differently: Take 5 mg by mouth See admin instructions. Take 2.5 mg on Mon,Wed,Fri,Sat,and Sun. 5 mg on Tue and Thu. 08/08/22   Corky Crafts, MD       Allergies    Other, Imdur [isosorbide nitrate], and Latex    Review of Systems   Review of Systems  Cardiovascular:  Positive for chest pain.  Review of systems completed and notable as per HPI.  ROS otherwise negative.   Physical Exam Updated Vital Signs BP 138/63 (BP Location: Right Arm)   Pulse (!) 54   Temp 98.7 F (37.1 C) (Oral)   Resp 15   SpO2 95%  Physical Exam Vitals and nursing note reviewed.  Constitutional:      General: He is not in acute distress.    Appearance: He is well-developed.  HENT:     Head: Normocephalic and atraumatic.  Eyes:     Extraocular Movements: Extraocular movements intact.     Conjunctiva/sclera: Conjunctivae normal.  Cardiovascular:     Rate and Rhythm: Normal rate and regular rhythm.     Pulses: Normal pulses.     Heart sounds: Normal heart sounds. No murmur heard. Pulmonary:     Effort: Pulmonary effort is normal. No respiratory distress.     Breath sounds: Normal breath sounds.  Abdominal:     Palpations: Abdomen is soft.     Tenderness: There is no abdominal tenderness.  Musculoskeletal:        General: No swelling.     Cervical back: Neck supple.     Right lower leg: No edema.     Left lower leg: No edema.  Skin:    General: Skin is warm and dry.     Capillary Refill: Capillary refill takes less than 2 seconds.  Neurological:     General: No focal deficit present.     Mental Status: He is alert. Mental status is at baseline.  Psychiatric:        Mood and Affect: Mood normal.     ED Results / Procedures / Treatments   Labs (all labs ordered are listed, but only abnormal results are displayed) Labs Reviewed  BASIC METABOLIC PANEL - Abnormal; Notable for the following components:      Result Value   Sodium 147 (*)    Glucose, Bld 118 (*)    BUN 32 (*)    Creatinine, Ser 1.93 (*)    Calcium 10.9 (*)    GFR, Estimated 35 (*)    All other components within normal limits  CBC - Abnormal; Notable for the following  components:   RBC 2.74 (*)    Hemoglobin 9.2 (*)    HCT 27.9 (*)    MCV 101.8 (*)    All other components within normal limits  TROPONIN I (HIGH SENSITIVITY)  TROPONIN I (HIGH SENSITIVITY)    EKG EKG Interpretation Date/Time:  Sunday January 31 2024 08:01:47 EST Ventricular  Rate:  54 PR Interval:  164 QRS Duration:  100 QT Interval:  452 QTC Calculation: 428 R Axis:   -2  Text Interpretation: Sinus bradycardia with occasional Premature ventricular complexes Minimal voltage criteria for LVH, may be normal variant ( R in aVL ) Septal infarct , age undetermined No significant change since last tracing When compared with ECG of 15-Sep-2023 11:44, PREVIOUS ECG IS PRESENT Confirmed by Gwyneth Sprout ([id number]) on 01/31/2024 8:48:43 AM  Radiology DG Chest 2 View Result Date: 01/31/2024 CLINICAL DATA:  78 year old male with chest pain. EXAM: CHEST - 2 VIEW COMPARISON:  Chest radiographs 09/15/2023 and earlier. FINDINGS: Chronic pronounced thoracic kyphosis. Increased AP dimension to the chest. Stable large lung volumes. Chronic sternotomy. Stable cardiomegaly and tortuous thoracic aorta. Stable ventilation with no pneumothorax, pulmonary edema, pleural effusion or confluent lung opacity. Pronounced osteopenia limiting spine detail. Negative visible bowel gas. Probably external projecting over writing pen the anterior abdomen on only the lateral views. IMPRESSION: No acute cardiopulmonary abnormality. Chronic hyperinflation, kyphosis, cardiomegaly. Electronically Signed   By: Odessa Fleming M.D.   On: 01/31/2024 09:26    Procedures Procedures    Medications Ordered in ED Medications  lactated ringers bolus 1,000 mL (1,000 mLs Intravenous New Bag/Given 01/31/24 1237)    ED Course/ Medical Decision Making/ A&P                                 Medical Decision Making Amount and/or Complexity of Data Reviewed Labs: ordered. Radiology: ordered.   Medical Decision Making:   MEKO MASTERSON  is a 78 y.o. male who presented to the ED today with episode of chest pain.  Currently is asymptomatic.  He EG sinus rhythm, no significant change from prior.  His pain is on atypical, though history is limited due to dementia and he does not remember the episode of pain.  Initial troponin is normal.  Will trend.  He is already anticoagulated, have lower special for PE especially no DVT symptoms.  Seems less consistent with dissection as well.  Could be musculoskeletal.  His sodium here slightly high at 147.  Will give him some IV fluids.  Renal function is at baseline.   Patient placed on continuous vitals and telemetry monitoring while in ED which was reviewed periodically.  Reviewed and confirmed nursing documentation for past medical history, family history, social history.  Reassessment and Plan:   Patient reassessed, he remains asymptomatic.  He has not had any recurrent chest pain.  Troponin is negative x 2, no signs of ACS.  His sodium here was slightly high, he was given some IV fluids and I recommend he have this rechecked in the next 2 to 3 days.  Given he is asymptomatic with reassuring workup and he can safely follow-up as an outpatient.  I recommend he call his PCP and cardiologist tomorrow for follow-up.  Both patient and his wife are comfortable with this plan.  Discharged with strict return precautions.   Patient's presentation is most consistent with acute complicated illness / injury requiring diagnostic workup.           Final Clinical Impression(s) / ED Diagnoses Final diagnoses:  Atypical chest pain    Rx / DC Orders ED Discharge Orders     None         Laurence Spates, MD 01/31/24 1426

## 2024-02-01 ENCOUNTER — Emergency Department (HOSPITAL_COMMUNITY): Payer: Medicare Other

## 2024-02-01 ENCOUNTER — Other Ambulatory Visit: Payer: Self-pay

## 2024-02-01 ENCOUNTER — Emergency Department (HOSPITAL_COMMUNITY)
Admission: EM | Admit: 2024-02-01 | Discharge: 2024-02-01 | Disposition: A | Discharge status: Home / Self Care | Payer: Medicare Other | Attending: Emergency Medicine | Admitting: Emergency Medicine

## 2024-02-01 DIAGNOSIS — Z87891 Personal history of nicotine dependence: Secondary | ICD-10-CM | POA: Diagnosis not present

## 2024-02-01 DIAGNOSIS — S199XXA Unspecified injury of neck, initial encounter: Secondary | ICD-10-CM | POA: Diagnosis not present

## 2024-02-01 DIAGNOSIS — I2510 Atherosclerotic heart disease of native coronary artery without angina pectoris: Secondary | ICD-10-CM | POA: Diagnosis not present

## 2024-02-01 DIAGNOSIS — G309 Alzheimer's disease, unspecified: Secondary | ICD-10-CM | POA: Insufficient documentation

## 2024-02-01 DIAGNOSIS — Z951 Presence of aortocoronary bypass graft: Secondary | ICD-10-CM | POA: Diagnosis not present

## 2024-02-01 DIAGNOSIS — R0789 Other chest pain: Secondary | ICD-10-CM | POA: Diagnosis not present

## 2024-02-01 DIAGNOSIS — R001 Bradycardia, unspecified: Secondary | ICD-10-CM | POA: Diagnosis not present

## 2024-02-01 DIAGNOSIS — M549 Dorsalgia, unspecified: Secondary | ICD-10-CM | POA: Diagnosis not present

## 2024-02-01 DIAGNOSIS — R609 Edema, unspecified: Secondary | ICD-10-CM | POA: Diagnosis not present

## 2024-02-01 DIAGNOSIS — I700 Atherosclerosis of aorta: Secondary | ICD-10-CM | POA: Diagnosis not present

## 2024-02-01 DIAGNOSIS — W19XXXA Unspecified fall, initial encounter: Secondary | ICD-10-CM | POA: Insufficient documentation

## 2024-02-01 DIAGNOSIS — I10 Essential (primary) hypertension: Secondary | ICD-10-CM | POA: Diagnosis not present

## 2024-02-01 DIAGNOSIS — S0990XA Unspecified injury of head, initial encounter: Secondary | ICD-10-CM | POA: Diagnosis not present

## 2024-02-01 DIAGNOSIS — G319 Degenerative disease of nervous system, unspecified: Secondary | ICD-10-CM | POA: Diagnosis not present

## 2024-02-01 DIAGNOSIS — F0280 Dementia in other diseases classified elsewhere without behavioral disturbance: Secondary | ICD-10-CM | POA: Diagnosis not present

## 2024-02-01 DIAGNOSIS — R600 Localized edema: Secondary | ICD-10-CM | POA: Diagnosis not present

## 2024-02-01 DIAGNOSIS — S3993XA Unspecified injury of pelvis, initial encounter: Secondary | ICD-10-CM | POA: Diagnosis not present

## 2024-02-01 DIAGNOSIS — S299XXA Unspecified injury of thorax, initial encounter: Secondary | ICD-10-CM | POA: Diagnosis not present

## 2024-02-01 DIAGNOSIS — I872 Venous insufficiency (chronic) (peripheral): Secondary | ICD-10-CM | POA: Diagnosis not present

## 2024-02-01 DIAGNOSIS — R944 Abnormal results of kidney function studies: Secondary | ICD-10-CM | POA: Insufficient documentation

## 2024-02-01 DIAGNOSIS — L97822 Non-pressure chronic ulcer of other part of left lower leg with fat layer exposed: Secondary | ICD-10-CM | POA: Diagnosis not present

## 2024-02-01 DIAGNOSIS — Z91040 Latex allergy status: Secondary | ICD-10-CM | POA: Diagnosis not present

## 2024-02-01 DIAGNOSIS — D649 Anemia, unspecified: Secondary | ICD-10-CM | POA: Insufficient documentation

## 2024-02-01 DIAGNOSIS — I517 Cardiomegaly: Secondary | ICD-10-CM | POA: Diagnosis not present

## 2024-02-01 LAB — CBC WITH DIFFERENTIAL/PLATELET
Abs Immature Granulocytes: 0.02 10*3/uL (ref 0.00–0.07)
Basophils Absolute: 0 10*3/uL (ref 0.0–0.1)
Basophils Relative: 0 %
Eosinophils Absolute: 0 10*3/uL (ref 0.0–0.5)
Eosinophils Relative: 1 %
HCT: 28.2 % — ABNORMAL LOW (ref 39.0–52.0)
Hemoglobin: 9.2 g/dL — ABNORMAL LOW (ref 13.0–17.0)
Immature Granulocytes: 1 %
Lymphocytes Relative: 15 %
Lymphs Abs: 0.5 10*3/uL — ABNORMAL LOW (ref 0.7–4.0)
MCH: 33 pg (ref 26.0–34.0)
MCHC: 32.6 g/dL (ref 30.0–36.0)
MCV: 101.1 fL — ABNORMAL HIGH (ref 80.0–100.0)
Monocytes Absolute: 0.3 10*3/uL (ref 0.1–1.0)
Monocytes Relative: 9 %
Neutro Abs: 2.7 10*3/uL (ref 1.7–7.7)
Neutrophils Relative %: 74 %
Platelets: 196 10*3/uL (ref 150–400)
RBC: 2.79 MIL/uL — ABNORMAL LOW (ref 4.22–5.81)
RDW: 14.9 % (ref 11.5–15.5)
WBC: 3.6 10*3/uL — ABNORMAL LOW (ref 4.0–10.5)
nRBC: 0 % (ref 0.0–0.2)

## 2024-02-01 LAB — BASIC METABOLIC PANEL
Anion gap: 9 (ref 5–15)
BUN: 31 mg/dL — ABNORMAL HIGH (ref 8–23)
CO2: 27 mmol/L (ref 22–32)
Calcium: 10.8 mg/dL — ABNORMAL HIGH (ref 8.9–10.3)
Chloride: 109 mmol/L (ref 98–111)
Creatinine, Ser: 1.56 mg/dL — ABNORMAL HIGH (ref 0.61–1.24)
GFR, Estimated: 46 mL/min — ABNORMAL LOW (ref >60)
Glucose, Bld: 109 mg/dL — ABNORMAL HIGH (ref 70–99)
Potassium: 4.6 mmol/L (ref 3.5–5.1)
Sodium: 145 mmol/L (ref 135–145)

## 2024-02-01 NOTE — ED Provider Notes (Signed)
 [place] [facility] DEPARTMENT AT [facility] Provider Note  Arrival date/time:02/01/2024 5:45 PM  HPI/ROS   [name] is a 78 y.o. male with PMH significant for dementia, hypertension, hyperlipidemia, CAD status post CABG in 1996  who presents for mechanical fall.  History is provided by EMS.  Patient has history of Alzheimer's and lives at home with wife who is the sole caretaker.  Patient usually requires assistance for ambulation.  However tried to ambulate to the bathroom independently and fell over onto the ground.  Positive head trauma.  Was complaining of head and neck pain on EMS arrival. He does take warfarin.  A complete ROS was performed with pertinent positives/negatives noted above.   ED Course and Medical Decision Making   I personally reviewed the patient's vitals.  Assessment/Plan: 78 year old patient with presents as a level 2 trauma for fall on warfarin.  Per wife at bedside, patient was ambulating without assistance and fell down, hitting his head.  On arrival to the emergency department, primary exam is intact. Secondary exam does not show any focal traumatic injuries.  Focused imaging includes: -CT head with no ICH or skull fracture -CT C-spine with no acute fracture or traumatic malalignment of the C-spine -CXR without pneumo or hemothorax.  Trachea is midline. -Pelvic x-ray shows no fracture, both hips are located.  Lab work shows no leukocytosis, hemoglobin is 9.2 which is baseline for patient. BMP shows no metabolic derangement, creatinine is 1.56, which is baseline for patient.  I discussed these reassuring findings with patient and his wife at bedside. Discussed that patient should be stable for discharge home.  I also discussed with wife whether or not she feels comfortable managing patient at home given that she is the sole caregiver. She does endorse that things have become more difficult lately to care for him, however she does  not want to put him into a skilled nursing facility at this time.  She used to work as a Engineer, civil (consulting) and so feels very competent, but does feel that she could use extra help.  We discussed the possibility of SNF placement versus discharge home today with plan for PCP to help her obtain home health. Wife would prefer to follow the latter route and follow-up with their PCP to obtain home health assistance. I believe this plan is safe for patient and wife.  I discussed returning to the emergency department with patient for any worsening symptoms or if there is concern for safety.   Disposition:  I discussed the plan for discharge with the patient and/or their surrogate at bedside prior to discharge and they were in agreement with the plan and verbalized understanding of the return precautions provided. All questions answered to the best of my ability. Ultimately, the patient was discharged in stable condition with stable vital signs. I am reassured that they are capable of close follow up and good social support at home.   Clinical Impression:  1. Fall, initial encounter     Rx / DC Orders ED Discharge Orders     None       The plan for this patient was discussed with Dr. Jeraldine Loots, who voiced agreement and who oversaw evaluation and treatment of this patient.   Clinical Complexity A medically appropriate history, review of systems, and physical exam was performed.  Patient's presentation is most consistent with acute presentation with potential threat to life or bodily function.  Medical Decision Making Amount and/or Complexity of Data Reviewed Labs: ordered. Radiology: ordered.  Physical Exam and Medical History   Vitals:   02/01/24 1530 02/01/24 1530 02/01/24 1630 02/01/24 1645  BP: (!) 144/66  130/60   Pulse: (!) 43  (!) 42 (!) 48  Resp: 10  (!) 9 11  Temp:  (!) 96.9 F (36.1 C)    TempSrc:  Axillary    SpO2: 97%  100%   Weight:      Height:        Physical  Exam:  General: No distress, appears well hydrated and well nourished   Head: Normocephalic, atraumatic.  No skull depressions or lacerations.  No conjunctival hemorrhage No periorbital ecchymoses, Racoon Eyes, or Battle Sign bilaterally Ears atraumatic No nasal septal deviation or hematoma  PERRL, EOMI, sclera anicteric. Mucus membranes moist.    Neck: Supple, trachea midline No TTP over midline cervical spine, no step offs or deformities.    Cardiovascular: RATE: regular RHYTHM: regular 2+ radial, femoral, DP pulses bilaterally   Respiratory/Chest Wall: Respiratory: normal WOB, breath sounds CTAB Clavicles stable to compression Chest stable to AP and Lateral Compression,  Chest nontender to palpation    Extremities: Warm, well perfused. No gross deformities.  Edema bialterally Dressing to RLE   Gastrointestinal: Abdomen soft, non tender, non distended   Neurologic: LOC: awake/alert EOM: intact, conjugate   Genitourinary: Normal genitalia   Skin: Normal, no rash or lesions.   Glasgow Coma Scale: Eye opening: 4  Verbal:  4  Motor:  6  GCS Total: 14     Rectal: Deferred   Spine: No TTP along midline C/T/L spine, no step offs or deformities  Marked kyphosis   Other:       Medical History: Allergies  Allergen Reactions   Other     Environmental allergies   Imdur [Isosorbide Nitrate] Other (See Comments)    headache   Latex     Itchy hands   Past Medical History:  Diagnosis Date   Allergy    Arthritis    CAD (coronary artery disease) 1996   POST CABG   Cataracts, bilateral    Chicken pox    Dyslipidemia    Hyperlipidemia    Hypertension    Sinus bradycardia     Past Surgical History:  Procedure Laterality Date   BYPASS GRAFT  1996   CATARACT EXTRACTION Right    COLONOSCOPY WITH PROPOFOL N/A 11/20/2022   Procedure: COLONOSCOPY WITH PROPOFOL;  Surgeon: Beverley Fiedler, MD;  Location: [facility] ENDOSCOPY;  Service: Gastroenterology;  Laterality: N/A;    INGUINAL HERNIA REPAIR Right 04/09/2020   Procedure: LAPAROSCOPIC RIGHT INGUINAL HERNIA REPAIR WITH MESH;  Surgeon: Axel Filler, MD;  Location: [facility] OR;  Service: General;  Laterality: Right;   POLYPECTOMY  11/20/2022   Procedure: POLYPECTOMY;  Surgeon: Beverley Fiedler, MD;  Location: [facility] ENDOSCOPY;  Service: Gastroenterology;;   REFRACTIVE SURGERY Right    SKIN BIOPSY     head x 2   UMBILICAL HERNIA REPAIR  2003   Family History  Problem Relation Age of Onset   Alzheimer's disease Mother    Cancer Father        prostate   Prostate cancer Father    AAA (abdominal aortic aneurysm) Sister    Hypertension Maternal Grandmother    Hypertension Paternal Grandmother    Colon cancer Other        great uncle   Esophageal cancer Neg Hx    Pancreatic cancer Neg Hx    Liver disease Neg Hx    Stomach cancer  Neg Hx     Social History   Tobacco Use   Smoking status: Former    Current packs/day: 0.00    Types: Cigarettes    Quit date: 05/29/1982    Years since quitting: 41.7   Smokeless tobacco: Never  Vaping Use   Vaping status: Never Used  Substance Use Topics   Alcohol use: Not Currently    Comment: 0-2 drinks a night   Drug use: No    Procedures   If procedures were preformed on this patient, they are listed below:  Procedures   -------- HPI and MDM generated using voice dictation software and may contain dictation errors. Please contact me for any clarification or with any questions.   Cephus Slater, MD Emergency Medicine PGY-2    Caron Presume, MD 02/01/24 1745    Gerhard Munch, MD 02/01/24 [phone]

## 2024-02-01 NOTE — Progress Notes (Signed)
 Orthopedic Tech Progress Note Patient Details:  [name] February 09, 1946 [id number]  Level 2 trauma   Patient ID: [name], male   DOB: 1946-03-19, 78 y.o.   MRN: [id number]  [name] 02/01/2024, 4:30 PM

## 2024-02-01 NOTE — Discharge Instructions (Signed)
 [facility] Kassis:  Thank you for allowing [place] to take care of you today.  We hope you begin feeling better soon.  To-Do: Please follow-up with your primary doctor to discuss any findings from today's emergency department visit.  Please discuss obtaining assistance at home with home health. Please return to the Emergency Department or call 911 if you experience chest pain, shortness of breath, severe pain, severe fever, altered mental status, or have any reason to think that you need emergency medical care.  Thank you again.  Hope you feel better soon.  Department of Emergency Medicine [facility]

## 2024-02-01 NOTE — ED Triage Notes (Signed)
 Patient arrives via [facility] for a fall from home. Fall walking to bathroom without any assistance, fell hit head, no LOC, complaining of back pain- chronic in duration, right hip pain, and neck pain. Patient is on coumadin and warfarin. Wound on lower left leg currently being treated. Alert and oriented x4, GCS 15.   BP 130/72 HR 55 RR 16 SpO2 95 on room air

## 2024-02-01 NOTE — Progress Notes (Signed)
   02/01/24 1600  Spiritual Encounters  Type of Visit Initial  Care provided to: Family  Referral source Trauma page  Reason for visit Routine spiritual support  OnCall Visit Yes   Chaplain was paged to trauma level 2. Pt was sleeping. Chaplain had a conversation with pt's wife. She declared that her husband fell couple of times before and he has balance issues. When he fell, she was in the other room. She heard the scream. She stated that it has been very tiring to be in ED all night. Chaplain accompanied the wife and provided support.  M.Kubra Delano Metz Resident [phone]

## 2024-02-01 NOTE — ED Notes (Signed)
Port XR at bedside.

## 2024-02-01 NOTE — ED Notes (Signed)
 Patient discharged by this RN. Patient in wheelchair to lobby with family.

## 2024-02-02 ENCOUNTER — Ambulatory Visit: Payer: Self-pay | Admitting: Family Medicine

## 2024-02-02 NOTE — Telephone Encounter (Signed)
 Evaluated in the ED yesterday. BJ

## 2024-02-02 NOTE — Telephone Encounter (Signed)
 Chief Complaint: Fall yesterday, change in demeanor Symptoms: see notes Frequency: yesterday Pertinent Negatives: Patient denies n'a Disposition: [] ED /[] Urgent Care (no appt availability in office) / [x] Appointment(In office/virtual)/ []  [facility]/ [] Home Care/ [] Refused Recommended Disposition /[] [place] [facility]/ []  Follow-up with PCP Additional Notes: Patient's wife called in stating patient fell yesterday, and wife did not witness fall. Patient's wife heard scream and crash and found patient down. Patient was taken to the Emergency Department and wife states they did a head scan and evaluated body and everything came back clear. However; patient's wife states today patient is not cooperating to transfer to chair and she is unsure if this is due to recent weakness or difference in cognition, due to patient's Alzheimers. Wife states patient doesn't appear at baseline and states he keeps counting numbers out. Patient's wife states she gave patient an Oxycodone at 10 am for pain from the fall as well. Evaluation tomorrow with PCP virtually, due to wife being unable to ambulate or transfer patient, and stating patient is incontinent. Advised wife to look for s/s of stroke and to call 911 if these symptoms appear.    Copied from CRM [id number]. Topic: Clinical - Red Word Triage >> Feb 02, 2024  1:32 PM [name] wrote: Red Word that prompted transfer to Nurse Triage: Patient wife called stated he fell yesterday and is not the same today, he cannot stand up or will not stand up. He is currently in his lift chair. Reason for Disposition  MILD weakness (i.e., does not interfere with ability to work, go to school, normal activities)  (Exception: Mild weakness is a chronic symptom.)    Fall yesterday, was evaluated in ED, change in demeanor today  Answer Assessment - Initial Assessment Questions 1. MECHANISM: "How did the fall happen?"     Wife is unsure, heard scream and crash 2. DOMESTIC  VIOLENCE AND ELDER ABUSE SCREENING: "Did you fall because someone pushed you or tried to hurt you?" If Yes, ask: "Are you safe now?"     No 3. ONSET: "When did the fall happen?" (e.g., minutes, hours, or days ago)     Yesterday 4. LOCATION: "What part of the body hit the ground?" (e.g., back, buttocks, head, hips, knees, hands, head, stomach)     Full body hit the ground 5. INJURY: "Did you hurt (injure) yourself when you fell?" If Yes, ask: "What did you injure? Tell me more about this?" (e.g., body area; type of injury; pain severity)"     no 6. PAIN: "Is there any pain?" If Yes, ask: "How bad is the pain?" (e.g., Scale 1-10; or mild,  moderate, severe)   - NONE (0): No pain   - MILD (1-3): Doesn't interfere with normal activities    - MODERATE (4-7): Interferes with normal activities or awakens from sleep    - SEVERE (8-10): Excruciating pain, unable to do any normal activities      Wife unsure of pain level 7. SIZE: For cuts, bruises, or swelling, ask: "How large is it?" (e.g., inches or centimeters)      No 9. OTHER SYMPTOMS: "Do you have any other symptoms?" (e.g., dizziness, fever, weakness; new onset or worsening).      Weakness, difference in demeanor 10. CAUSE: "What do you think caused the fall (or falling)?" (e.g., tripped, dizzy spell)       Wife is unsure, she did not witness  Protocols used: Falls and [facility]

## 2024-02-03 ENCOUNTER — Telehealth (INDEPENDENT_AMBULATORY_CARE_PROVIDER_SITE_OTHER): Payer: Medicare Other | Admitting: Family Medicine

## 2024-02-03 ENCOUNTER — Encounter: Payer: Self-pay | Admitting: Family Medicine

## 2024-02-03 VITALS — Ht 64.5 in

## 2024-02-03 DIAGNOSIS — I83029 Varicose veins of left lower extremity with ulcer of unspecified site: Secondary | ICD-10-CM | POA: Diagnosis not present

## 2024-02-03 DIAGNOSIS — L97929 Non-pressure chronic ulcer of unspecified part of left lower leg with unspecified severity: Secondary | ICD-10-CM

## 2024-02-03 DIAGNOSIS — W19XXXD Unspecified fall, subsequent encounter: Secondary | ICD-10-CM | POA: Diagnosis not present

## 2024-02-03 DIAGNOSIS — F02818 Dementia in other diseases classified elsewhere, unspecified severity, with other behavioral disturbance: Secondary | ICD-10-CM

## 2024-02-03 DIAGNOSIS — G301 Alzheimer's disease with late onset: Secondary | ICD-10-CM

## 2024-02-03 MED ORDER — OXYCODONE-ACETAMINOPHEN 7.5-325 MG PO TABS: 1.0000 | ORAL_TABLET | Freq: Four times a day (QID) | ORAL | 0 refills | Status: DC | PRN

## 2024-02-03 NOTE — Progress Notes (Signed)
 Virtual Visit via Video Note I connected with [name] on 02/03/2024 by a video enabled telemedicine application and verified that I am speaking with the correct person using two identifiers. Location patient: home Location provider:work office Persons participating in the virtual visit: patient, patient's wife, provider, scribe  I discussed the limitations of evaluation and management by telemedicine and the availability of in person appointments. The patient expressed understanding and agreed to proceed.  Chief Complaint  Patient presents with   Follow-up   HPI: Abdul Beirne is a 78 y.o. male with a PMHx significant for CAD, chronic anticoagulation, HTN, Alzheimer's disease, HLD, and chronic pain, who is being seen on video today for ED follow up.   Patient went to the ED on 2/23 for chest pain and on 2/24 after a fall. He was walking to the bathroom He had head and cervical spine Cts, both negative  for acute process.  Patient's wife says he is weaker, more unsteady on his feet, and having difficulty standing since returning home from the ED. She mentions he is sleeping a lot throughout the day and staying up all night.This has aggravated LE edema. She also notes that he is becoming more combative as his Alzheimer's disease continue to worsen.  His next appt with neurologist in 07/2024. It has been recommended to start Depakote, has not started due to concerned about possible interaction with coumadin. Currently he is on Namenda 10 mg twice daily and donepezil 10 mg daily. His leg wound also continues to be a significant problem. Wife states he is waking up screaming in pain at night occasionally.  Changing his bandage has been particularly difficult, and Dr. Nelda Severe wrote an order for a home health nurse to help her change the bandage at their last visit on 2/24.  She does not believe current Percocet dose is strong enough at present.  Currently on Percocet 5-325 mg every 8  hours as needed for pain.  Next appointment with wound care is 3/3.   He is only taking Lorazepam 0.5 mg with extreme agitation, which his wife says is not very often.   Cervical spine and head CTs from 02/01/2024 impression:  1.) Technically limited examination due to patient motion and image noise.  2.) As visualized, no acute intracranial abnormality is suggested. Diffuse cerebral atrophy.  3.) There is reversal of the usual cervical lordosis, nonspecific. No acute displaced fractures are identified as visualized. Diffuse degenerative changes.   ROS: See pertinent positives and negatives per HPI.  Past Medical History:  Diagnosis Date   Allergy    Anemia    Anxiety    Arthritis    CAD (coronary artery disease) 1996   POST CABG   Cataracts, bilateral    Chicken pox    Dyslipidemia    Hyperlipidemia    Hypertension    Sinus bradycardia    Past Surgical History:  Procedure Laterality Date   BYPASS GRAFT  1996   CATARACT EXTRACTION Right    COLONOSCOPY WITH PROPOFOL N/A 11/20/2022   Procedure: COLONOSCOPY WITH PROPOFOL;  Surgeon: Beverley Fiedler, MD;  Location: [facility] ENDOSCOPY;  Service: Gastroenterology;  Laterality: N/A;   CORONARY ARTERY BYPASS GRAFT  6.1996   CABG x3 (SVGs, IMA)   EYE SURGERY  12.2017   HERNIA REPAIR  9.2003   Umbilical.   INGUINAL HERNIA REPAIR Right 04/09/2020   Procedure: LAPAROSCOPIC RIGHT INGUINAL HERNIA REPAIR WITH MESH;  Surgeon: Axel Filler, MD;  Location[facility] OR;  Service: General;  Laterality: Right;   POLYPECTOMY  11/20/2022   Procedure: POLYPECTOMY;  Surgeon: Beverley Fiedler, MD;  Location: [facility] ENDOSCOPY;  Service: Gastroenterology;;   REFRACTIVE SURGERY Right    SKIN BIOPSY     head x 2   UMBILICAL HERNIA REPAIR  2003   Family History  Problem Relation Age of Onset   Alzheimer's disease Mother    Cancer Father        prostate   Prostate cancer Father    AAA (abdominal aortic aneurysm) Sister    Hypertension Maternal Grandmother     Hypertension Paternal Grandmother    Colon cancer Other        great uncle   Esophageal cancer Neg Hx    Pancreatic cancer Neg Hx    Liver disease Neg Hx    Stomach cancer Neg Hx    Social History   Socioeconomic History   Marital status: Married    Spouse name: [name]   Number of children: 2   Years of education: COLLEGE   Highest education level: Bachelor's degree (e.g., BA, AB, BS)  Occupational History   Occupation: RETIRED  Tobacco Use   Smoking status: Former    Current packs/day: 0.00    Types: Cigarettes    Quit date: 05/29/1982    Years since quitting: 41.7   Smokeless tobacco: Never  Vaping Use   Vaping status: Never Used  Substance and Sexual Activity   Alcohol use: Not Currently    Comment: 0-2 drinks a night   Drug use: No   Sexual activity: Not on file  Other Topics Concern   Not on file  Social History Narrative   Right handed   Caffeine   One story home lives with wife   retired   Chief Executive Officer Drivers of Corporate investment banker Strain: Low Risk  (01/08/2024)   Overall Financial Resource Strain (CARDIA)    Difficulty of Paying Living Expenses: Not hard at all  Food Insecurity: No Food Insecurity (01/08/2024)   Hunger Vital Sign    Worried About Running Out of Food in the Last Year: Never true    Ran Out of Food in the Last Year: Never true  Transportation Needs: No Transportation Needs (01/08/2024)   PRAPARE - Administrator, Civil Service (Medical): No    Lack of Transportation (Non-Medical): No  Physical Activity: Inactive (01/08/2024)   Exercise Vital Sign    Days of Exercise per Week: 0 days    Minutes of Exercise per Session: 0 min  Stress: No Stress Concern Present (01/08/2024)   [employer] of Occupational Health - Occupational Stress Questionnaire    Feeling of Stress : Not at all  Recent Concern: Stress - Stress Concern Present (12/19/2023)   [employer] of Occupational Health - Occupational Stress Questionnaire     Feeling of Stress : To some extent  Social Connections: Moderately Isolated (01/08/2024)   Social Connection and Isolation Panel [NHANES]    Frequency of Communication with Friends and Family: More than three times a week    Frequency of Social Gatherings with Friends and Family: Once a week    Attends Religious Services: Never    Database administrator or Organizations: No    Attends Banker Meetings: Never    Marital Status: Married  Catering manager Violence: Not At Risk (01/08/2024)   Humiliation, Afraid, Rape, and Kick questionnaire    Fear of Current or Ex-Partner: No    Emotionally Abused: No  Physically Abused: No    Sexually Abused: No    Current Outpatient Medications:    oxyCODONE-acetaminophen (PERCOCET) 7.5-325 MG tablet, Take 1 tablet by mouth every 6 (six) hours as needed for severe pain (pain score 7-10)., Disp: 30 tablet, Rfl: 0   amLODipine (NORVASC) 5 MG tablet, TAKE ONE AND ONE-HALF TABLETS DAILY (Patient taking differently: Take 5 mg by mouth daily.), Disp: 135 tablet, Rfl: 3   cholecalciferol (VITAMIN D) 1000 units tablet, Take 1,000 Units by mouth daily. , Disp: , Rfl:    Cyanocobalamin (VITAMIN B12) 500 MCG TABS, Take 500 mcg by mouth daily. , Disp: , Rfl:    divalproex (DEPAKOTE) 125 MG DR tablet, Take 1 tab at night for 1 week, then increase to 2 tablets at night May take it in the morning if needed, Disp: 180 tablet, Rfl: 3   donepezil (ARICEPT) 10 MG tablet, Take 10 mg by mouth daily., Disp: , Rfl:    ezetimibe (ZETIA) 10 MG tablet, TAKE 1 TABLET AT BEDTIME, Disp: 90 tablet, Rfl: 3   fenofibrate (TRICOR) 145 MG tablet, TAKE 1 TABLET AT BEDTIME, Disp: 90 tablet, Rfl: 3   finasteride (PROSCAR) 5 MG tablet, Take 5 mg by mouth daily., Disp: , Rfl:    furosemide (LASIX) 40 MG tablet, Take 1 tablet (40 mg total) by mouth every other day., Disp: 90 tablet, Rfl: 3   LORazepam (ATIVAN) 0.5 MG tablet, Take 0.5 tablets (0.25 mg total) by mouth 2 (two) times  daily as needed for anxiety., Disp: 30 tablet, Rfl: 0   losartan (COZAAR) 50 MG tablet, Take 1 tablet (50 mg total) by mouth daily., Disp: 90 tablet, Rfl: 2   memantine (NAMENDA) 10 MG tablet, Take 1 tablet (10 mg total) by mouth 2 (two) times daily., Disp: 180 tablet, Rfl: 3   naloxegol oxalate (MOVANTIK) 25 MG TABS tablet, Take 1 tablet (25 mg total) by mouth daily., Disp: 30 tablet, Rfl: 1   nitroGLYCERIN (NITROSTAT) 0.4 MG SL tablet, Place 1 tablet (0.4 mg total) under the tongue every 5 (five) minutes as needed for chest pain., Disp: 75 tablet, Rfl: 2   ranolazine (RANEXA) 500 MG 12 hr tablet, Take 1 tablet (500 mg total) by mouth 2 (two) times daily., Disp: 60 tablet, Rfl: 0   rosuvastatin (CRESTOR) 40 MG tablet, TAKE 1 TABLET DAILY, Disp: 90 tablet, Rfl: 3   tamsulosin (FLOMAX) 0.4 MG CAPS capsule, Take 0.4 mg by mouth daily after supper., Disp: , Rfl:    warfarin (COUMADIN) 5 MG tablet, TAKE AS DIRECTED BY COUMADIN CLINIC (Patient taking differently: Take 5 mg by mouth See admin instructions. Take 2.5 mg on Mon,Wed,Fri,Sat,and Sun. 5 mg on Tue and Thu.), Disp: 80 tablet, Rfl: 3  EXAM:  VITALS per patient if applicable:Ht 5' 4.5" (1.638 m)   BMI 25.35 kg/m   GENERAL: alert, oriented, appears well and in no acute distress  HEENT: atraumatic, conjunctiva clear, no obvious abnormalities on inspection of external nose and ears  NECK: normal movements of the head and neck  LUNGS: on inspection no signs of respiratory distress, breathing rate appears normal, no obvious gross SOB, gasping or wheezing  CV: no obvious cyanosis  MS: moves all visible extremities without noticeable abnormality  PSYCH/NEURO: pleasant and cooperative, no obvious depression or anxiety.  ASSESSMENT AND PLAN:  Discussed the following assessment and plan:  Fall, subsequent encounter He has several risk factors for falls, including comorbidities and medications. History of the importance of using his walker  all the time. We discussed fall precautions in general.  Venous ulcer of left leg (HCC) Following with wound clinic, pain is severe and current dose of Percocet (5-325 mg) is not helping when dressing is being changed. After discussion of some side effects and risk of medication interaction, he is wife would like to have dose increase, so he will try Percocet 7.5-325 mg every 6 hours as needed. Instructed not to give it when he takes alprazolam. Lower extremity elevation a few times throughout the day. Currently he is on furosemide 40 mg every other day.  -     oxyCODONE-Acetaminophen; Take 1 tablet by mouth every 6 (six) hours as needed for severe pain (pain score 7-10).  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 He is wife reports some episodes of agitation,it is becoming more difficult to manage with no help. Currently on Namenda and Aricept. He does not take lorazepam 0.5 mg frequently but he really helps with episodes of agitation. Considering to start Depakote 125 mg bid, recommend doing to and communicate with coumadin clinic to have INR check earlier if appropriate.  We may want to consider stopping some of his medications as problem gets worse.  We discussed possible serious and likely etiologies, options for evaluation and workup, limitations of telemedicine visit vs in person visit, treatment, treatment risks and precautions. The patient was advised to call back or seek an in-person evaluation if the symptoms worsen or if the condition fails to improve as anticipated. I discussed the assessment and treatment plan with the patient. The patient was provided an opportunity to ask questions and all were answered. The patient agreed with the plan and demonstrated an understanding of the instructions.  Return if symptoms worsen or fail to improve, for keep next appointment.  I, Rolla Etienne Wierda, acting as  a scribe for Perri Aragones [place], MD., have documented all relevant documentation on the behalf of Sheppard Luckenbach [place], MD, as directed by  Lala Been [place], MD while in the presence of Jaxen Samples [place], MD.   I, Kyona Chauncey [place], MD, have reviewed all documentation for this visit. The documentation on 02/03/24 for the exam, diagnosis, procedures, and orders are all accurate and complete.  Ansley Mangiapane [place], MD

## 2024-02-04 DIAGNOSIS — I10 Essential (primary) hypertension: Secondary | ICD-10-CM | POA: Diagnosis not present

## 2024-02-04 DIAGNOSIS — Z556 Problems related to health literacy: Secondary | ICD-10-CM | POA: Diagnosis not present

## 2024-02-04 DIAGNOSIS — G309 Alzheimer's disease, unspecified: Secondary | ICD-10-CM | POA: Diagnosis not present

## 2024-02-04 DIAGNOSIS — L97821 Non-pressure chronic ulcer of other part of left lower leg limited to breakdown of skin: Secondary | ICD-10-CM | POA: Diagnosis not present

## 2024-02-04 DIAGNOSIS — Z951 Presence of aortocoronary bypass graft: Secondary | ICD-10-CM | POA: Diagnosis not present

## 2024-02-04 DIAGNOSIS — I2510 Atherosclerotic heart disease of native coronary artery without angina pectoris: Secondary | ICD-10-CM | POA: Diagnosis not present

## 2024-02-04 DIAGNOSIS — I872 Venous insufficiency (chronic) (peripheral): Secondary | ICD-10-CM | POA: Diagnosis not present

## 2024-02-04 DIAGNOSIS — Z9181 History of falling: Secondary | ICD-10-CM | POA: Diagnosis not present

## 2024-02-04 DIAGNOSIS — F0280 Dementia in other diseases classified elsewhere without behavioral disturbance: Secondary | ICD-10-CM | POA: Diagnosis not present

## 2024-02-08 DIAGNOSIS — L97822 Non-pressure chronic ulcer of other part of left lower leg with fat layer exposed: Secondary | ICD-10-CM | POA: Diagnosis not present

## 2024-02-08 DIAGNOSIS — I872 Venous insufficiency (chronic) (peripheral): Secondary | ICD-10-CM | POA: Diagnosis not present

## 2024-02-08 DIAGNOSIS — G309 Alzheimer's disease, unspecified: Secondary | ICD-10-CM | POA: Diagnosis not present

## 2024-02-08 DIAGNOSIS — I890 Lymphedema, not elsewhere classified: Secondary | ICD-10-CM | POA: Diagnosis not present

## 2024-02-10 DIAGNOSIS — I872 Venous insufficiency (chronic) (peripheral): Secondary | ICD-10-CM | POA: Diagnosis not present

## 2024-02-10 DIAGNOSIS — I2510 Atherosclerotic heart disease of native coronary artery without angina pectoris: Secondary | ICD-10-CM | POA: Diagnosis not present

## 2024-02-10 DIAGNOSIS — G309 Alzheimer's disease, unspecified: Secondary | ICD-10-CM | POA: Diagnosis not present

## 2024-02-10 DIAGNOSIS — I10 Essential (primary) hypertension: Secondary | ICD-10-CM | POA: Diagnosis not present

## 2024-02-10 DIAGNOSIS — L97821 Non-pressure chronic ulcer of other part of left lower leg limited to breakdown of skin: Secondary | ICD-10-CM | POA: Diagnosis not present

## 2024-02-10 DIAGNOSIS — F0280 Dementia in other diseases classified elsewhere without behavioral disturbance: Secondary | ICD-10-CM | POA: Diagnosis not present

## 2024-02-12 DIAGNOSIS — L97821 Non-pressure chronic ulcer of other part of left lower leg limited to breakdown of skin: Secondary | ICD-10-CM | POA: Diagnosis not present

## 2024-02-12 DIAGNOSIS — F0280 Dementia in other diseases classified elsewhere without behavioral disturbance: Secondary | ICD-10-CM | POA: Diagnosis not present

## 2024-02-12 DIAGNOSIS — I872 Venous insufficiency (chronic) (peripheral): Secondary | ICD-10-CM | POA: Diagnosis not present

## 2024-02-12 DIAGNOSIS — I10 Essential (primary) hypertension: Secondary | ICD-10-CM | POA: Diagnosis not present

## 2024-02-12 DIAGNOSIS — G309 Alzheimer's disease, unspecified: Secondary | ICD-10-CM | POA: Diagnosis not present

## 2024-02-12 DIAGNOSIS — I2510 Atherosclerotic heart disease of native coronary artery without angina pectoris: Secondary | ICD-10-CM | POA: Diagnosis not present

## 2024-02-15 DIAGNOSIS — L97822 Non-pressure chronic ulcer of other part of left lower leg with fat layer exposed: Secondary | ICD-10-CM | POA: Diagnosis not present

## 2024-02-15 DIAGNOSIS — G309 Alzheimer's disease, unspecified: Secondary | ICD-10-CM | POA: Diagnosis not present

## 2024-02-15 DIAGNOSIS — I872 Venous insufficiency (chronic) (peripheral): Secondary | ICD-10-CM | POA: Diagnosis not present

## 2024-02-15 DIAGNOSIS — I890 Lymphedema, not elsewhere classified: Secondary | ICD-10-CM | POA: Diagnosis not present

## 2024-02-16 ENCOUNTER — Telehealth: Payer: Self-pay | Admitting: Internal Medicine

## 2024-02-16 NOTE — Telephone Encounter (Signed)
 Patients wife called requesting to speak with a nurse regarding the Movantix medication that is not approved by his insurance.

## 2024-02-16 NOTE — Telephone Encounter (Signed)
 Patient's wife reports they received a letter stating [facility] gave the maximum amount of Movantik to the patient until they do a step therapy or type an exemption letter for patient to receive the medication. [facility] states they will not cover the formulary Monvantik until patient has tried and failed other formulary medications such as Motegrity and Trulance. Patient's wife states she wanted Dr. Rhea Belton aware the [name] has worked really well for her husband. He has not had any issues with constipation since his last visit. Please advise Dr. Rhea Belton if you wish to start a step therapy and switch patient to an alternative medication.

## 2024-02-17 DIAGNOSIS — I872 Venous insufficiency (chronic) (peripheral): Secondary | ICD-10-CM | POA: Diagnosis not present

## 2024-02-17 DIAGNOSIS — G309 Alzheimer's disease, unspecified: Secondary | ICD-10-CM | POA: Diagnosis not present

## 2024-02-17 DIAGNOSIS — I10 Essential (primary) hypertension: Secondary | ICD-10-CM | POA: Diagnosis not present

## 2024-02-17 DIAGNOSIS — L97821 Non-pressure chronic ulcer of other part of left lower leg limited to breakdown of skin: Secondary | ICD-10-CM | POA: Diagnosis not present

## 2024-02-17 DIAGNOSIS — F0280 Dementia in other diseases classified elsewhere without behavioral disturbance: Secondary | ICD-10-CM | POA: Diagnosis not present

## 2024-02-17 DIAGNOSIS — I2510 Atherosclerotic heart disease of native coronary artery without angina pectoris: Secondary | ICD-10-CM | POA: Diagnosis not present

## 2024-02-17 NOTE — Telephone Encounter (Signed)
 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

## 2024-02-17 NOTE — Telephone Encounter (Signed)
 Please see note from Dr. Rhea Belton. Please contact insurance company and initiate PA for Movantik for opioid-induced constipation. Thanks.

## 2024-02-18 ENCOUNTER — Telehealth: Payer: Self-pay

## 2024-02-18 ENCOUNTER — Other Ambulatory Visit (HOSPITAL_COMMUNITY): Payer: Self-pay

## 2024-02-18 NOTE — Telephone Encounter (Signed)
 Pharmacy Patient Advocate Encounter   Received notification from CoverMyMeds that prior authorization for Movantik 25 mg tablets is required/requested.   Insurance verification completed.   The patient is insured through [employer] .   Per test claim: PA required; PA submitted to above mentioned insurance via CoverMyMeds Key/confirmation #/EOC [id number] Status is pending  PER CMM PA HAS ALREADY BEEN SUBMITTED AND IS IN PROCESS FOR THIS PATIENT AND DRUG. CASE ID: [id number]

## 2024-02-18 NOTE — Telephone Encounter (Signed)
 Not that I know of. I have not submitted a PA for Movantik.

## 2024-02-18 NOTE — Telephone Encounter (Signed)
 Okay ill look into it! Thank you!

## 2024-02-18 NOTE — Telephone Encounter (Signed)
 Hi I tried to submit prior auth for patient's Movantik and it stated prior Berkley Harvey has already been submitted and is in process. Did you guys already start the process?

## 2024-02-19 DIAGNOSIS — G309 Alzheimer's disease, unspecified: Secondary | ICD-10-CM | POA: Diagnosis not present

## 2024-02-19 DIAGNOSIS — I2510 Atherosclerotic heart disease of native coronary artery without angina pectoris: Secondary | ICD-10-CM | POA: Diagnosis not present

## 2024-02-19 DIAGNOSIS — L97821 Non-pressure chronic ulcer of other part of left lower leg limited to breakdown of skin: Secondary | ICD-10-CM | POA: Diagnosis not present

## 2024-02-19 DIAGNOSIS — I872 Venous insufficiency (chronic) (peripheral): Secondary | ICD-10-CM | POA: Diagnosis not present

## 2024-02-19 DIAGNOSIS — I10 Essential (primary) hypertension: Secondary | ICD-10-CM | POA: Diagnosis not present

## 2024-02-19 DIAGNOSIS — F0280 Dementia in other diseases classified elsewhere without behavioral disturbance: Secondary | ICD-10-CM | POA: Diagnosis not present

## 2024-02-22 ENCOUNTER — Other Ambulatory Visit: Payer: Self-pay | Admitting: Physician Assistant

## 2024-02-22 ENCOUNTER — Ambulatory Visit: Payer: Medicare Other | Admitting: Physician Assistant

## 2024-02-22 DIAGNOSIS — L97822 Non-pressure chronic ulcer of other part of left lower leg with fat layer exposed: Secondary | ICD-10-CM | POA: Diagnosis not present

## 2024-02-22 DIAGNOSIS — G309 Alzheimer's disease, unspecified: Secondary | ICD-10-CM | POA: Diagnosis not present

## 2024-02-22 DIAGNOSIS — I872 Venous insufficiency (chronic) (peripheral): Secondary | ICD-10-CM | POA: Diagnosis not present

## 2024-02-22 DIAGNOSIS — I890 Lymphedema, not elsewhere classified: Secondary | ICD-10-CM | POA: Diagnosis not present

## 2024-02-23 NOTE — Telephone Encounter (Signed)
 Patients wife called stated she still has not heard from anyone in the office with a follow up on the PA pending. She said she has spoken with the insurance as well but is getting no where with the office feels like she's the one doing everything in order for her husband to get what he needs.. She asked if someone can help with this situation as soon as possible.

## 2024-02-23 NOTE — Telephone Encounter (Signed)
 Inbound call from patient's wife requesting a update regarding prior auth. Please advise, thank you.

## 2024-02-23 NOTE — Telephone Encounter (Signed)
 Please give an update on status of PA for Movantik please.

## 2024-02-24 ENCOUNTER — Other Ambulatory Visit (HOSPITAL_COMMUNITY): Payer: Self-pay

## 2024-02-24 DIAGNOSIS — I2510 Atherosclerotic heart disease of native coronary artery without angina pectoris: Secondary | ICD-10-CM | POA: Diagnosis not present

## 2024-02-24 DIAGNOSIS — I872 Venous insufficiency (chronic) (peripheral): Secondary | ICD-10-CM | POA: Diagnosis not present

## 2024-02-24 DIAGNOSIS — G309 Alzheimer's disease, unspecified: Secondary | ICD-10-CM | POA: Diagnosis not present

## 2024-02-24 DIAGNOSIS — L97821 Non-pressure chronic ulcer of other part of left lower leg limited to breakdown of skin: Secondary | ICD-10-CM | POA: Diagnosis not present

## 2024-02-24 DIAGNOSIS — F0280 Dementia in other diseases classified elsewhere without behavioral disturbance: Secondary | ICD-10-CM | POA: Diagnosis not present

## 2024-02-24 DIAGNOSIS — I10 Essential (primary) hypertension: Secondary | ICD-10-CM | POA: Diagnosis not present

## 2024-02-24 NOTE — Telephone Encounter (Signed)
 Returned patient's wife call and explained that we are still waiting on the PA team to inform us with the status. Patient's wife expressed concerned that according to [facility] the PA will expire on 3/26/. Patient's wife states her husband is almost out of medication. Informed patient's wife that I will try to reach to the PA team with an answer today.

## 2024-02-24 NOTE — Telephone Encounter (Signed)
 That's what the rejection stated when I ran the test claim. I still have to contact the insurance, had a few other calls to make as well, and I'll see what's going on with the other submission and if there is a work around without patient taking the alternative. I'll keep you updated.

## 2024-02-24 NOTE — Telephone Encounter (Signed)
 Alrighty. So I contacted the patient insurance plan to check status of prior authorization. First rep I spoke with was able to tell me that it was still in the review process and the max final date was actually yesterday. Because of this, she sent me to the PA department who were able to help me. This rep was able to tell me that there were additional questions faxed to the office but were never returned so I was able to process those questions over the phone. After submitting this, she informed me that someone had 'taken' the request from her and it was actively being reviewed. Stated that we should have a decision by end of day today and we will get a fax ( I provided the pa team info) and the patient would receive a call and/or letter in the mail. Informed if not heard anything by end of day tomorrow (3.20) so call them back.   Pharmacy # [phone] Case ID for PA  [id number]

## 2024-02-24 NOTE — Telephone Encounter (Signed)
 I apologize about the wait on this. I will give the insurance a call and see what they can tell me about the request that was sent outside of our team. When we try to process, it will not allow [place] to due to this. As well, I tried a test claim of it just to see if maybe the authorization was processed through but am still receiving a denial. However, it does show a requirement of the patient needing to try and fail symproic. I am not seeing this on the patient profile. Do you have information about this medication? If patient has not or can not take it, please provide medical reasoning so I can use it for submission, if needed.

## 2024-02-24 NOTE — Telephone Encounter (Signed)
 Please see encounter from 3.13.25 for additional and updated information.

## 2024-02-24 NOTE — Telephone Encounter (Signed)
 Good morning,  The patient has not tried symproic. The patient has been taking Movantik and it has really helped with his constipation. So patient has to try and fail symproic before insurance will cover Movantik?

## 2024-02-25 ENCOUNTER — Other Ambulatory Visit: Payer: Self-pay | Admitting: Family Medicine

## 2024-02-25 ENCOUNTER — Other Ambulatory Visit (HOSPITAL_COMMUNITY): Payer: Self-pay

## 2024-02-25 DIAGNOSIS — L97929 Non-pressure chronic ulcer of unspecified part of left lower leg with unspecified severity: Secondary | ICD-10-CM

## 2024-02-25 DIAGNOSIS — M159 Polyosteoarthritis, unspecified: Secondary | ICD-10-CM

## 2024-02-25 NOTE — Telephone Encounter (Signed)
 Good afternoon, I did speak with patient's wife and updated her on the pending review and we should reach a determination by today according to your note. Please let me know as soon as you can the determination for patient's medication. Thanks.

## 2024-02-25 NOTE — Telephone Encounter (Signed)
 Copied from CRM [id number]. Topic: Clinical - Medication Refill >> Feb 25, 2024 11:41 AM Denese Killings wrote: Most Recent Primary Care Visit:  Provider: [place], BETTY G  Department: [facility]  Visit Type: HOSPITAL FOLLOW UP  Date: 02/03/2024  Medication: oxyCODONE-acetaminophen (PERCOCET) 7.5-325 MG tablet  Has the patient contacted their pharmacy? Yes (Agent: If no, request that the patient contact the pharmacy for the refill. If patient does not wish to contact the pharmacy document the reason why and proceed with request.) (Agent: If yes, when and what did the pharmacy advise?) pharmacy won't fill it controlled substance  Is this the correct pharmacy for this prescription? Yes If no, delete pharmacy and type the correct one.  This is the patient's preferred pharmacy:  [id number] [place], [place] - [address] [facility] [address] [place] [address] Phone: [phone] Fax: [phone]  Has the prescription been filled recently? Yes  Is the patient out of the medication? Yes 1 pill left   Has the patient been seen for an appointment in the last year OR does the patient have an upcoming appointment? Yes  Can we respond through MyChart? Yes  Agent: Please be advised that Rx refills may take up to 3 business days. We ask that you follow-up with your pharmacy.

## 2024-02-25 NOTE — Progress Notes (Unsigned)
 Cardiology Office Note:    Date:  02/26/2024   ID:  [name] 08-Oct-1946, MRN [id number]  PCP:  [place], Betty G, MD  Cardiologist:  Timothy Ishikawa, MD  Electrophysiologist:  None   Referring MD: [place], Betty G, MD   Chief Complaint  Patient presents with   Coronary Artery Disease    History of Present Illness:    [name] is a 78 y.o. male with a hx of CAD status post CABG in 1996, hypertension, hyperlipidemia who presents for follow-up.  Previously followed with [name].  He had 2 ED visits for chest pain in August 2024.  Stress test or heart catheterization was discussed but he stated he was unable to lay flat on table and declined any imaging or procedures.  He was started on Imdur 30 mg daily and Ranexa 500 mg twice daily.  Reports discontinued Imdur due to headaches.  States that chest pain has improved, denies any recent chest pain.  Denies any dyspnea, lightheadedness, syncope,or palpitations.  Has chronic swelling in legs which reports from lymphedema.  Has blister in legs and is following in wound clinic.  Denies any bleeding on warfarin.   Past Medical History:  Diagnosis Date   Allergy    Anemia    Anxiety    Arthritis    CAD (coronary artery disease) 1996   POST CABG   Cataracts, bilateral    Chicken pox    Dyslipidemia    Hyperlipidemia    Hypertension    Sinus bradycardia     Past Surgical History:  Procedure Laterality Date   BYPASS GRAFT  1996   CATARACT EXTRACTION Right    COLONOSCOPY WITH PROPOFOL N/A 11/20/2022   Procedure: COLONOSCOPY WITH PROPOFOL;  Surgeon: Timothy Fiedler, MD;  Location: [facility] ENDOSCOPY;  Service: Gastroenterology;  Laterality: N/A;   CORONARY ARTERY BYPASS GRAFT  6.1996   CABG x3 (SVGs, IMA)   EYE SURGERY  12.2017   HERNIA REPAIR  9.2003   Umbilical.   INGUINAL HERNIA REPAIR Right 04/09/2020   Procedure: LAPAROSCOPIC RIGHT INGUINAL HERNIA REPAIR WITH MESH;  Surgeon: Timothy Filler, MD;   Location: [facility] OR;  Service: General;  Laterality: Right;   POLYPECTOMY  11/20/2022   Procedure: POLYPECTOMY;  Surgeon: Timothy Fiedler, MD;  Location: [facility] ENDOSCOPY;  Service: Gastroenterology;;   REFRACTIVE SURGERY Right    SKIN BIOPSY     head x 2   UMBILICAL HERNIA REPAIR  2003    Current Medications: Current Meds  Medication Sig   amLODipine (NORVASC) 5 MG tablet TAKE ONE AND ONE-HALF TABLETS DAILY (Patient taking differently: Take 5 mg by mouth daily.)   cholecalciferol (VITAMIN D) 1000 units tablet Take 1,000 Units by mouth daily.    Cyanocobalamin (VITAMIN B12) 500 MCG TABS Take 500 mcg by mouth daily.    divalproex (DEPAKOTE) 125 MG DR tablet Take 1 tab at night for 1 week, then increase to 2 tablets at night May take it in the morning if needed   ezetimibe (ZETIA) 10 MG tablet TAKE 1 TABLET AT BEDTIME   fenofibrate (TRICOR) 145 MG tablet TAKE 1 TABLET AT BEDTIME   finasteride (PROSCAR) 5 MG tablet Take 5 mg by mouth daily.   LORazepam (ATIVAN) 0.5 MG tablet Take 0.5 tablets (0.25 mg total) by mouth 2 (two) times daily as needed for anxiety.   losartan (COZAAR) 50 MG tablet Take 1 tablet (50 mg total) by mouth daily.   memantine (NAMENDA) 10 MG  tablet Take 1 tablet (10 mg total) by mouth 2 (two) times daily.   MOVANTIK 25 MG TABS tablet TAKE 1 TABLET(25 MG) BY MOUTH DAILY   oxyCODONE-acetaminophen (PERCOCET) 7.5-325 MG tablet Take 1 tablet by mouth every 6 (six) hours as needed for severe pain (pain score 7-10).   ranolazine (RANEXA) 500 MG 12 hr tablet Take 1 tablet (500 mg total) by mouth 2 (two) times daily.   rosuvastatin (CRESTOR) 40 MG tablet TAKE 1 TABLET DAILY   tamsulosin (FLOMAX) 0.4 MG CAPS capsule Take 0.4 mg by mouth daily after supper.   warfarin (COUMADIN) 5 MG tablet TAKE AS DIRECTED BY COUMADIN CLINIC (Patient taking differently: Take 5 mg by mouth See admin instructions. Take 2.5 mg on Mon,Wed,Fri,Sat,and Sun. 5 mg on Tue and Thu.)   [DISCONTINUED] nitroGLYCERIN  (NITROSTAT) 0.4 MG SL tablet Place 1 tablet (0.4 mg total) under the tongue every 5 (five) minutes as needed for chest pain.     Allergies:   Other, Imdur [isosorbide nitrate], and Latex   Social History   Socioeconomic History   Marital status: Married    Spouse [name]   Number of children: 2   Years of education: COLLEGE   Highest education level: Bachelor's degree (e.Lloyd., BA, AB, BS)  Occupational History   Occupation: RETIRED  Tobacco Use   Smoking status: Former    Current packs/day: 0.00    Types: Cigarettes    Quit date: 05/29/1982    Years since quitting: 41.7   Smokeless tobacco: Never  Vaping Use   Vaping status: Never Used  Substance and Sexual Activity   Alcohol use: Not Currently    Comment: 0-2 drinks a night   Drug use: Never   Sexual activity: Not Currently    Partners: Female    Comment: married  Other Topics Concern   Not on file  Social History Narrative   Right handed   Caffeine   One story home lives with wife   retired   Chief Executive Officer Drivers of Corporate investment banker Strain: Low Risk  (01/08/2024)   Overall Financial Resource Strain (CARDIA)    Difficulty of Paying Living Expenses: Not hard at all  Food Insecurity: No Food Insecurity (01/08/2024)   Hunger Vital Sign    Worried About Running Out of Food in the Last Year: Never true    Ran Out of Food in the Last Year: Never true  Transportation Needs: No Transportation Needs (01/08/2024)   PRAPARE - Administrator, Civil Service (Medical): No    Lack of Transportation (Non-Medical): No  Physical Activity: Inactive (01/08/2024)   Exercise Vital Sign    Days of Exercise per Week: 0 days    Minutes of Exercise per Session: 0 min  Stress: No Stress Concern Present (01/08/2024)   [employer] of Occupational Health - Occupational Stress Questionnaire    Feeling of Stress : Not at all  Recent Concern: Stress - Stress Concern Present (12/19/2023)   [employer] of Occupational  Health - Occupational Stress Questionnaire    Feeling of Stress : To some extent  Social Connections: Moderately Isolated (01/08/2024)   Social Connection and Isolation Panel [NHANES]    Frequency of Communication with Friends and Family: More than three times a week    Frequency of Social Gatherings with Friends and Family: Once a week    Attends Religious Services: Never    Database administrator or Organizations: No    Attends Club or  Organization Meetings: Never    Marital Status: Married     Family History: The patient's family history includes AAA (abdominal aortic aneurysm) in his sister; Alzheimer's disease in his mother; Cancer in his father; Colon cancer in an other family member; Hypertension in his maternal grandmother and paternal grandmother; Prostate cancer in his father. There is no history of Esophageal cancer, Pancreatic cancer, Liver disease, or Stomach cancer.  ROS:   Please see the history of present illness.     All other systems reviewed and are negative.  EKGs/Labs/Other Studies Reviewed:    The following studies were reviewed today:   EKG:  EKG is not ordered today.    Recent Labs: 06/24/2023: TSH 1.24 09/15/2023: ALT 14 02/01/2024: BUN 31; Creatinine, Ser 1.56; Hemoglobin 9.2; Platelets 196; Potassium 4.6; Sodium 145  Recent Lipid Panel    Component Value Date/Time   CHOL 122 03/26/2023 0749   TRIG 56 03/26/2023 0749   HDL 62 03/26/2023 0749   CHOLHDL 2.0 03/26/2023 0749   CHOLHDL 2 11/05/2021 0845   VLDL 9.4 11/05/2021 0845   LDLCALC 47 03/26/2023 0749    Physical Exam:    VS:  BP (!) 110/56   Pulse (!) 52   Ht 6\' 1"  (1.854 m)   Wt 160 lb (72.6 kg)   SpO2 99%   BMI 21.11 kg/m     Wt Readings from Last 3 Encounters:  02/26/24 160 lb (72.6 kg)  02/01/24 150 lb (68 kg)  01/08/24 152 lb 14.4 oz (69.4 kg)     GEN: Chronically ill-appearing HEENT: Normal NECK: No JVD; No carotid bruits LYMPHATICS: No lymphadenopathy CARDIAC: RRR, no  murmurs, rubs, gallops RESPIRATORY:  Clear to auscultation without rales, wheezing or rhonchi  ABDOMEN: Soft, non-tender, non-distended MUSCULOSKELETAL: 2+ bilateral edema; No deformity  SKIN: Warm and dry NEUROLOGIC:  Alert and oriented x 3 PSYCHIATRIC:  Normal affect   ASSESSMENT:    1. Coronary artery disease involving native coronary artery of native heart with angina pectoris (HCC)   2. Sinus bradycardia   3. Mixed hyperlipidemia   4. Primary hypercoagulable state (HCC)   5. Bilateral lower extremity edema    PLAN:    CAD:  status post CABG in 1996, hypertension, hyperlipidemia who presents for follow-up.  Previously followed with [name].  He had 2 ED visits for chest pain in August 2024.  Stress test or heart catheterization was discussed but he stated he was unable to lay flat on table and declined any imaging or procedures.  He was started on Imdur 30 mg daily and Ranexa 500 mg twice daily but did not tolerate Imdur due to headaches -Continue warfarin -Continue rosuvastatin, Zetia -Continue Ranexa.  Currently denies anginal symptoms  Bradycardia: Wife has noted heart rate down to 40s.  Check Zio patch x 7 days  Hypertension: On amlodipine 5 mg daily and losartan 50 mg daily.  Appears controlled  Chronic diastolic heart failure: On Lasix 40 mg every other day.  Does have significant lower extremity edema on exam but also with history of lymphedema.  Check BNP, CMET.  Check echocardiogram  Hyperlipidemia: On rosuvastatin 40 mg daily, Zetia 10 mg daily.  LDL 40 06/2023.  Check lipid panel  Hypercoagulable disorder: On Coumadin for reported history of hypercoagulable disorder.  Unclear diagnosis, he does not have any history of PE or DVT but reports family history.  States that he was diagnosed with hypercoagulable disorder at [facility] in 1990s.  Would recommend establishing with hematology to  see if does indeed have hypercoagulable disorder and whether anticoagulation  recommended.  If anticoagulation needed, would recommend discussing with hematology if can switch from Coumadin to DOAC  RTC in 3 months  Medication Adjustments/Labs and Tests Ordered: Current medicines are reviewed at length with the patient today.  Concerns regarding medicines are outlined above.  Orders Placed This Encounter  Procedures   B Nat Peptide   Comprehensive metabolic panel   CBC   Magnesium   Lipid panel   Ambulatory referral to Hematology / Oncology   LONG TERM MONITOR (3-14 DAYS)   ECHOCARDIOGRAM COMPLETE   Meds ordered this encounter  Medications   nitroGLYCERIN (NITROSTAT) 0.4 MG SL tablet    Sig: Place 1 tablet (0.4 mg total) under the tongue every 5 (five) minutes as needed for chest pain (Up to 3 doses in one 24 hour period, if needing more, please go to Emergency Dept. to be evaluated).    Dispense:  45 tablet    Refill:  2    Patient Instructions  Medication Instructions:  No Changes  Lab Work: Today: BNP, CMET, Lipid Panel, CBC, Magnesium  Testing/Procedures: Your physician has requested that you have an echocardiogram. Echocardiography is a painless test that uses sound waves to create images of your heart. It provides your doctor with information about the size and shape of your heart and how well your heart's chambers and valves are working. This procedure takes approximately one hour. There are no restrictions for this procedure. Please do NOT wear cologne, perfume, aftershave, or lotions (deodorant is allowed). Please arrive 15 minutes prior to your appointment time. This will take place at [address]   ZIO XT- Long Term Monitor Instructions  Your physician has requested you wear a ZIO patch monitor for 7 days.  This is a single patch monitor. [employer] supplies one patch monitor per enrollment. Additional stickers are not available. Please do not apply patch if you will be having a Nuclear Stress Test,  Echocardiogram, Cardiac CT,  MRI, or Chest Xray during the period you would be wearing the  monitor. The patch cannot be worn during these tests. You cannot remove and re-apply the  ZIO XT patch monitor.  Your ZIO patch monitor will be mailed 3 day USPS to your address on file. It may take 3-5 days  to receive your monitor after you have been enrolled.  Once you have received your monitor, please review the enclosed instructions. Your monitor  has already been registered assigning a specific monitor serial # to you.  Billing and Patient Assistance Program Information  We have supplied [employer] with any of your insurance information on file for billing purposes. [employer] offers a sliding scale Patient Assistance Program for patients that do not have  insurance, or whose insurance does not completely cover the cost of the ZIO monitor.  You must apply for the Patient Assistance Program to qualify for this discounted rate.  To apply, please call [employer] at [phone], select option 4, select option 2, ask to apply for  Patient Assistance Program. Meredeth Ide will ask your household income, and how many people  are in your household. They will quote your out-of-pocket cost based on that information.  [employer] will also be able to set up a 37-month, interest-free payment plan if needed.  Applying the monitor   Shave hair from upper left chest.  Hold abrader disc by orange tab. Rub abrader in 40 strokes over the upper left chest as  indicated in your monitor instructions.  Clean area with 4 enclosed alcohol pads. Let dry.  Apply patch as indicated in monitor instructions. Patch will be placed under collarbone on left  side of chest with arrow pointing upward.  Rub patch adhesive wings for 2 minutes. Remove white label marked "1". Remove the white  label marked "2". Rub patch adhesive wings for 2 additional minutes.  While looking in a mirror, press and release button in center of patch. A small green light will  flash 3-4  times. This will be your only indicator that the monitor has been turned on.  Do not shower for the first 24 hours. You may shower after the first 24 hours.  Press the button if you feel a symptom. You will hear a small click. Record Date, Time and  Symptom in the Patient Logbook.  When you are ready to remove the patch, follow instructions on the last 2 pages of Patient  Logbook. Stick patch monitor onto the last page of Patient Logbook.  Place Patient Logbook in the blue and white box. Use locking tab on box and tape box closed  securely. The blue and white box has prepaid postage on it. Please place it in the mailbox as  soon as possible. Your physician should have your test results approximately 7 days after the  monitor has been mailed back to [facility].  Call [facility] Customer Care at [phone] if you have questions regarding  your ZIO XT patch monitor. Call them immediately if you see an orange light blinking on your  monitor.  If your monitor falls off in less than 4 days, contact our Monitor department at [phone].  If your monitor becomes loose or falls off after 4 days call [employer] at [phone] for  suggestions on securing your monitor    Follow-Up: At [facility], you and your health needs are our priority.  As part of our continuing mission to provide you with exceptional heart care, we have created designated Provider Care Teams.  These Care Teams include your primary Cardiologist (physician) and Advanced Practice Providers (APPs -  Physician Assistants and Nurse Practitioners) who all work together to provide you with the care you need, when you need it.   Your next appointment:   3 month(s)  Provider:   Little Ishikawa, MD        Signed, Timothy Ishikawa, MD  02/26/2024 5:38 PM    [facility]

## 2024-02-26 ENCOUNTER — Ambulatory Visit: Payer: Medicare Other | Attending: Cardiology | Admitting: Cardiology

## 2024-02-26 ENCOUNTER — Ambulatory Visit (INDEPENDENT_AMBULATORY_CARE_PROVIDER_SITE_OTHER)

## 2024-02-26 ENCOUNTER — Encounter: Payer: Self-pay | Admitting: Cardiology

## 2024-02-26 ENCOUNTER — Other Ambulatory Visit: Payer: Self-pay | Admitting: Internal Medicine

## 2024-02-26 ENCOUNTER — Other Ambulatory Visit: Payer: Self-pay | Admitting: Family Medicine

## 2024-02-26 ENCOUNTER — Other Ambulatory Visit (HOSPITAL_COMMUNITY): Payer: Self-pay

## 2024-02-26 ENCOUNTER — Ambulatory Visit: Payer: Medicare Other

## 2024-02-26 VITALS — BP 110/56 | HR 52 | Ht 73.0 in | Wt 160.0 lb

## 2024-02-26 DIAGNOSIS — E782 Mixed hyperlipidemia: Secondary | ICD-10-CM

## 2024-02-26 DIAGNOSIS — R001 Bradycardia, unspecified: Secondary | ICD-10-CM

## 2024-02-26 DIAGNOSIS — I25119 Atherosclerotic heart disease of native coronary artery with unspecified angina pectoris: Secondary | ICD-10-CM | POA: Diagnosis not present

## 2024-02-26 DIAGNOSIS — I2510 Atherosclerotic heart disease of native coronary artery without angina pectoris: Secondary | ICD-10-CM | POA: Diagnosis not present

## 2024-02-26 DIAGNOSIS — L97821 Non-pressure chronic ulcer of other part of left lower leg limited to breakdown of skin: Secondary | ICD-10-CM | POA: Diagnosis not present

## 2024-02-26 DIAGNOSIS — D6859 Other primary thrombophilia: Secondary | ICD-10-CM

## 2024-02-26 DIAGNOSIS — R600 Localized edema: Secondary | ICD-10-CM | POA: Diagnosis not present

## 2024-02-26 DIAGNOSIS — I83029 Varicose veins of left lower extremity with ulcer of unspecified site: Secondary | ICD-10-CM

## 2024-02-26 DIAGNOSIS — G309 Alzheimer's disease, unspecified: Secondary | ICD-10-CM | POA: Diagnosis not present

## 2024-02-26 DIAGNOSIS — Z5181 Encounter for therapeutic drug level monitoring: Secondary | ICD-10-CM

## 2024-02-26 DIAGNOSIS — I872 Venous insufficiency (chronic) (peripheral): Secondary | ICD-10-CM | POA: Diagnosis not present

## 2024-02-26 DIAGNOSIS — I10 Essential (primary) hypertension: Secondary | ICD-10-CM | POA: Diagnosis not present

## 2024-02-26 DIAGNOSIS — F0280 Dementia in other diseases classified elsewhere without behavioral disturbance: Secondary | ICD-10-CM | POA: Diagnosis not present

## 2024-02-26 LAB — POCT INR: INR: 1.8 — AB (ref 2.0–3.0)

## 2024-02-26 MED ORDER — NITROGLYCERIN 0.4 MG SL SUBL: 0.4000 mg | SUBLINGUAL_TABLET | SUBLINGUAL | 2 refills | Status: DC | PRN

## 2024-02-26 NOTE — Progress Notes (Unsigned)
 Enrolled patient for a 7 day Zio XT monitor to be mailed

## 2024-02-26 NOTE — Patient Instructions (Addendum)
 Medication Instructions:  No Changes  Lab Work: Today: BNP, CMET, Lipid Panel, CBC, Magnesium  Testing/Procedures: Your physician has requested that you have an echocardiogram. Echocardiography is a painless test that uses sound waves to create images of your heart. It provides your doctor with information about the size and shape of your heart and how well your heart's chambers and valves are working. This procedure takes approximately one hour. There are no restrictions for this procedure. Please do NOT wear cologne, perfume, aftershave, or lotions (deodorant is allowed). Please arrive 15 minutes prior to your appointment time. This will take place at [address]   ZIO XT- Long Term Monitor Instructions  Your physician has requested you wear a ZIO patch monitor for 7 days.  This is a single patch monitor. [employer] supplies one patch monitor per enrollment. Additional stickers are not available. Please do not apply patch if you will be having a Nuclear Stress Test,  Echocardiogram, Cardiac CT, MRI, or Chest Xray during the period you would be wearing the  monitor. The patch cannot be worn during these tests. You cannot remove and re-apply the  ZIO XT patch monitor.  Your ZIO patch monitor will be mailed 3 day USPS to your address on file. It may take 3-5 days  to receive your monitor after you have been enrolled.  Once you have received your monitor, please review the enclosed instructions. Your monitor  has already been registered assigning a specific monitor serial # to you.  Billing and Patient Assistance Program Information  We have supplied [employer] with any of your insurance information on file for billing purposes. [employer] offers a sliding scale Patient Assistance Program for patients that do not have  insurance, or whose insurance does not completely cover the cost of the ZIO monitor.  You must apply for the Patient Assistance Program to qualify for this discounted  rate.  To apply, please call [employer] at [phone], select option 4, select option 2, ask to apply for  Patient Assistance Program. Meredeth Ide will ask your household income, and how many people  are in your household. They will quote your out-of-pocket cost based on that information.  [employer] will also be able to set up a 48-month, interest-free payment plan if needed.  Applying the monitor   Shave hair from upper left chest.  Hold abrader disc by orange tab. Rub abrader in 40 strokes over the upper left chest as  indicated in your monitor instructions.  Clean area with 4 enclosed alcohol pads. Let dry.  Apply patch as indicated in monitor instructions. Patch will be placed under collarbone on left  side of chest with arrow pointing upward.  Rub patch adhesive wings for 2 minutes. Remove white label marked "1". Remove the white  label marked "2". Rub patch adhesive wings for 2 additional minutes.  While looking in a mirror, press and release button in center of patch. A small green light will  flash 3-4 times. This will be your only indicator that the monitor has been turned on.  Do not shower for the first 24 hours. You may shower after the first 24 hours.  Press the button if you feel a symptom. You will hear a small click. Record Date, Time and  Symptom in the Patient Logbook.  When you are ready to remove the patch, follow instructions on the last 2 pages of Patient  Logbook. Stick patch monitor onto the last page of Patient Logbook.  Place Patient  Logbook in the blue and white box. Use locking tab on box and tape box closed  securely. The blue and white box has prepaid postage on it. Please place it in the mailbox as  soon as possible. Your physician should have your test results approximately 7 days after the  monitor has been mailed back to [facility].  Call [facility] Customer Care at [phone] if you have questions regarding  your ZIO XT patch monitor. Call them  immediately if you see an orange light blinking on your  monitor.  If your monitor falls off in less than 4 days, contact our Monitor department at [phone].  If your monitor becomes loose or falls off after 4 days call [employer] at [phone] for  suggestions on securing your monitor    Follow-Up: At [facility], you and your health needs are our priority.  As part of our continuing mission to provide you with exceptional heart care, we have created designated Provider Care Teams.  These Care Teams include your primary Cardiologist (physician) and Advanced Practice Providers (APPs -  Physician Assistants and Nurse Practitioners) who all work together to provide you with the care you need, when you need it.   Your next appointment:   3 month(s)  Provider:   Little Ishikawa, MD

## 2024-02-26 NOTE — Patient Instructions (Signed)
 Description   Take 1 tablets today and then continue taking 1/2 tablet daily EXCEPT 1 tablet on Thursdays.  Stay consistent with greens and ensure each week (decreased Ensure to 1 time per day) Recheck INR in 3 weeks.  Coumadin Clinic [phone]

## 2024-02-26 NOTE — Telephone Encounter (Signed)
 Informed patient's wife that Movantik was approved. She verbalized understanding.

## 2024-02-26 NOTE — Telephone Encounter (Signed)
 Pharmacy Patient Advocate Encounter  Received notification from [facility] that Prior Authorization for Movantik 25MG  tablets has been APPROVED from 01-24-2024 to 02-23-2025   PA #/Case ID/Reference #: [id number]

## 2024-02-26 NOTE — Telephone Encounter (Signed)
 Is he on Oxycodone-Acetaminophen or tramadol? We did change temporarily to Oxycodone because leg wound, he is not supposed to be on both. Thanks, BJ

## 2024-02-27 LAB — CBC
Hematocrit: 28.6 % — ABNORMAL LOW (ref 37.5–51.0)
Hemoglobin: 9.4 g/dL — ABNORMAL LOW (ref 13.0–17.7)
MCH: 33.1 pg — ABNORMAL HIGH (ref 26.6–33.0)
MCHC: 32.9 g/dL (ref 31.5–35.7)
MCV: 101 fL — ABNORMAL HIGH (ref 79–97)
Platelets: 198 10*3/uL (ref 150–450)
RBC: 2.84 x10E6/uL — ABNORMAL LOW (ref 4.14–5.80)
RDW: 12.7 % (ref 11.6–15.4)
WBC: 3.5 10*3/uL (ref 3.4–10.8)

## 2024-02-27 LAB — COMPREHENSIVE METABOLIC PANEL
ALT: 15 IU/L (ref 0–44)
AST: 24 IU/L (ref 0–40)
Albumin: 4.2 g/dL (ref 3.8–4.8)
Alkaline Phosphatase: 27 IU/L — ABNORMAL LOW (ref 44–121)
BUN/Creatinine Ratio: 20 (ref 10–24)
BUN: 26 mg/dL (ref 8–27)
Bilirubin Total: 0.2 mg/dL (ref 0.0–1.2)
CO2: 26 mmol/L (ref 20–29)
Calcium: 10.4 mg/dL — ABNORMAL HIGH (ref 8.6–10.2)
Chloride: 107 mmol/L — ABNORMAL HIGH (ref 96–106)
Creatinine, Ser: 1.29 mg/dL — ABNORMAL HIGH (ref 0.76–1.27)
Globulin, Total: 1.5 g/dL (ref 1.5–4.5)
Glucose: 96 mg/dL (ref 70–99)
Potassium: 4.5 mmol/L (ref 3.5–5.2)
Sodium: 145 mmol/L — ABNORMAL HIGH (ref 134–144)
Total Protein: 5.7 g/dL — ABNORMAL LOW (ref 6.0–8.5)
eGFR: 57 mL/min/{1.73_m2} — ABNORMAL LOW (ref >59)

## 2024-02-27 LAB — LIPID PANEL
Chol/HDL Ratio: 2.2 ratio (ref 0.0–5.0)
Cholesterol, Total: 104 mg/dL (ref 100–199)
HDL: 48 mg/dL (ref >39)
LDL Chol Calc (NIH): 46 mg/dL (ref 0–99)
Triglycerides: 36 mg/dL (ref 0–149)
VLDL Cholesterol Cal: 10 mg/dL (ref 5–40)

## 2024-02-27 LAB — BRAIN NATRIURETIC PEPTIDE: BNP: 190.8 pg/mL — ABNORMAL HIGH (ref 0.0–100.0)

## 2024-02-27 LAB — MAGNESIUM: Magnesium: 2.1 mg/dL (ref 1.6–2.3)

## 2024-02-29 ENCOUNTER — Telehealth: Payer: Self-pay

## 2024-02-29 DIAGNOSIS — I872 Venous insufficiency (chronic) (peripheral): Secondary | ICD-10-CM | POA: Diagnosis not present

## 2024-02-29 DIAGNOSIS — I890 Lymphedema, not elsewhere classified: Secondary | ICD-10-CM | POA: Diagnosis not present

## 2024-02-29 DIAGNOSIS — I25119 Atherosclerotic heart disease of native coronary artery with unspecified angina pectoris: Secondary | ICD-10-CM

## 2024-02-29 DIAGNOSIS — G309 Alzheimer's disease, unspecified: Secondary | ICD-10-CM | POA: Diagnosis not present

## 2024-02-29 DIAGNOSIS — L97322 Non-pressure chronic ulcer of left ankle with fat layer exposed: Secondary | ICD-10-CM | POA: Diagnosis not present

## 2024-02-29 DIAGNOSIS — L97822 Non-pressure chronic ulcer of other part of left lower leg with fat layer exposed: Secondary | ICD-10-CM | POA: Diagnosis not present

## 2024-02-29 MED ORDER — OXYCODONE-ACETAMINOPHEN 7.5-325 MG PO TABS: 0.5000 | ORAL_TABLET | Freq: Two times a day (BID) | ORAL | 0 refills | Status: DC | PRN

## 2024-02-29 NOTE — Telephone Encounter (Signed)
 Dr.Schumann's 02/26/24 office visit states patient stopped taking Isosorbide caused a headache.Isosorbide already removed form med list.

## 2024-02-29 NOTE — Telephone Encounter (Signed)
 Pt's pharmacy [facility] mail order pharmacy is stating that pt has an allergy to isosorbide dinitrate. There can be cross-sensitivity, please review and evaluate whether nitroglycerin would be appropriate to continue dispensing for this patient, since drug is in the same allergy group. Inv: [id number]  Ph: [phone]  is Dr. Darryl Nestle aware and would he like for pt to continue taking medication? Please address

## 2024-03-02 DIAGNOSIS — L97821 Non-pressure chronic ulcer of other part of left lower leg limited to breakdown of skin: Secondary | ICD-10-CM | POA: Diagnosis not present

## 2024-03-02 DIAGNOSIS — I2510 Atherosclerotic heart disease of native coronary artery without angina pectoris: Secondary | ICD-10-CM | POA: Diagnosis not present

## 2024-03-02 DIAGNOSIS — G309 Alzheimer's disease, unspecified: Secondary | ICD-10-CM | POA: Diagnosis not present

## 2024-03-02 DIAGNOSIS — I872 Venous insufficiency (chronic) (peripheral): Secondary | ICD-10-CM | POA: Diagnosis not present

## 2024-03-02 DIAGNOSIS — F0280 Dementia in other diseases classified elsewhere without behavioral disturbance: Secondary | ICD-10-CM | POA: Diagnosis not present

## 2024-03-02 DIAGNOSIS — I10 Essential (primary) hypertension: Secondary | ICD-10-CM | POA: Diagnosis not present

## 2024-03-02 MED ORDER — NITROGLYCERIN 0.4 MG SL SUBL: 0.4000 mg | SUBLINGUAL_TABLET | SUBLINGUAL | 11 refills | Status: DC | PRN

## 2024-03-02 NOTE — Telephone Encounter (Signed)
 Called pharmacy back and they are stating that the same ingredients that are in the isosorbide is also in the nitroglycerin and is Dr. Bjorn Pippin aware and would he still like for pt to take this medication? Please address

## 2024-03-02 NOTE — Telephone Encounter (Signed)
 Spoke to patient's wife patient takes NTG only if needed for chest pain.Advised I will send in a new prescription to [facility] at [facility].

## 2024-03-02 NOTE — Addendum Note (Signed)
 Addended by: Neoma Laming on: 03/02/2024 10:35 AM   Modules accepted: Orders

## 2024-03-03 ENCOUNTER — Encounter (HOSPITAL_BASED_OUTPATIENT_CLINIC_OR_DEPARTMENT_OTHER): Payer: Medicare Other | Admitting: General Surgery

## 2024-03-04 DIAGNOSIS — F0280 Dementia in other diseases classified elsewhere without behavioral disturbance: Secondary | ICD-10-CM | POA: Diagnosis not present

## 2024-03-04 DIAGNOSIS — I10 Essential (primary) hypertension: Secondary | ICD-10-CM | POA: Diagnosis not present

## 2024-03-04 DIAGNOSIS — I872 Venous insufficiency (chronic) (peripheral): Secondary | ICD-10-CM | POA: Diagnosis not present

## 2024-03-04 DIAGNOSIS — I2510 Atherosclerotic heart disease of native coronary artery without angina pectoris: Secondary | ICD-10-CM | POA: Diagnosis not present

## 2024-03-04 DIAGNOSIS — L97821 Non-pressure chronic ulcer of other part of left lower leg limited to breakdown of skin: Secondary | ICD-10-CM | POA: Diagnosis not present

## 2024-03-04 DIAGNOSIS — G309 Alzheimer's disease, unspecified: Secondary | ICD-10-CM | POA: Diagnosis not present

## 2024-03-05 DIAGNOSIS — I2510 Atherosclerotic heart disease of native coronary artery without angina pectoris: Secondary | ICD-10-CM | POA: Diagnosis not present

## 2024-03-05 DIAGNOSIS — Z556 Problems related to health literacy: Secondary | ICD-10-CM | POA: Diagnosis not present

## 2024-03-05 DIAGNOSIS — F0280 Dementia in other diseases classified elsewhere without behavioral disturbance: Secondary | ICD-10-CM | POA: Diagnosis not present

## 2024-03-05 DIAGNOSIS — I872 Venous insufficiency (chronic) (peripheral): Secondary | ICD-10-CM | POA: Diagnosis not present

## 2024-03-05 DIAGNOSIS — L97821 Non-pressure chronic ulcer of other part of left lower leg limited to breakdown of skin: Secondary | ICD-10-CM | POA: Diagnosis not present

## 2024-03-05 DIAGNOSIS — Z9181 History of falling: Secondary | ICD-10-CM | POA: Diagnosis not present

## 2024-03-05 DIAGNOSIS — Z951 Presence of aortocoronary bypass graft: Secondary | ICD-10-CM | POA: Diagnosis not present

## 2024-03-05 DIAGNOSIS — G309 Alzheimer's disease, unspecified: Secondary | ICD-10-CM | POA: Diagnosis not present

## 2024-03-05 DIAGNOSIS — I10 Essential (primary) hypertension: Secondary | ICD-10-CM | POA: Diagnosis not present

## 2024-03-07 DIAGNOSIS — G309 Alzheimer's disease, unspecified: Secondary | ICD-10-CM | POA: Diagnosis not present

## 2024-03-07 DIAGNOSIS — L97822 Non-pressure chronic ulcer of other part of left lower leg with fat layer exposed: Secondary | ICD-10-CM | POA: Diagnosis not present

## 2024-03-07 DIAGNOSIS — I872 Venous insufficiency (chronic) (peripheral): Secondary | ICD-10-CM | POA: Diagnosis not present

## 2024-03-07 DIAGNOSIS — I890 Lymphedema, not elsewhere classified: Secondary | ICD-10-CM | POA: Diagnosis not present

## 2024-03-09 DIAGNOSIS — I2510 Atherosclerotic heart disease of native coronary artery without angina pectoris: Secondary | ICD-10-CM | POA: Diagnosis not present

## 2024-03-09 DIAGNOSIS — G309 Alzheimer's disease, unspecified: Secondary | ICD-10-CM | POA: Diagnosis not present

## 2024-03-09 DIAGNOSIS — L97821 Non-pressure chronic ulcer of other part of left lower leg limited to breakdown of skin: Secondary | ICD-10-CM | POA: Diagnosis not present

## 2024-03-09 DIAGNOSIS — I872 Venous insufficiency (chronic) (peripheral): Secondary | ICD-10-CM | POA: Diagnosis not present

## 2024-03-09 DIAGNOSIS — I10 Essential (primary) hypertension: Secondary | ICD-10-CM | POA: Diagnosis not present

## 2024-03-09 DIAGNOSIS — F0280 Dementia in other diseases classified elsewhere without behavioral disturbance: Secondary | ICD-10-CM | POA: Diagnosis not present

## 2024-03-11 DIAGNOSIS — I10 Essential (primary) hypertension: Secondary | ICD-10-CM | POA: Diagnosis not present

## 2024-03-11 DIAGNOSIS — F0280 Dementia in other diseases classified elsewhere without behavioral disturbance: Secondary | ICD-10-CM | POA: Diagnosis not present

## 2024-03-11 DIAGNOSIS — G309 Alzheimer's disease, unspecified: Secondary | ICD-10-CM | POA: Diagnosis not present

## 2024-03-11 DIAGNOSIS — I2510 Atherosclerotic heart disease of native coronary artery without angina pectoris: Secondary | ICD-10-CM | POA: Diagnosis not present

## 2024-03-11 DIAGNOSIS — I872 Venous insufficiency (chronic) (peripheral): Secondary | ICD-10-CM | POA: Diagnosis not present

## 2024-03-11 DIAGNOSIS — L97821 Non-pressure chronic ulcer of other part of left lower leg limited to breakdown of skin: Secondary | ICD-10-CM | POA: Diagnosis not present

## 2024-03-14 DIAGNOSIS — L97822 Non-pressure chronic ulcer of other part of left lower leg with fat layer exposed: Secondary | ICD-10-CM | POA: Diagnosis not present

## 2024-03-14 DIAGNOSIS — I872 Venous insufficiency (chronic) (peripheral): Secondary | ICD-10-CM | POA: Diagnosis not present

## 2024-03-14 DIAGNOSIS — I2510 Atherosclerotic heart disease of native coronary artery without angina pectoris: Secondary | ICD-10-CM | POA: Diagnosis not present

## 2024-03-14 DIAGNOSIS — Z951 Presence of aortocoronary bypass graft: Secondary | ICD-10-CM | POA: Diagnosis not present

## 2024-03-14 DIAGNOSIS — E785 Hyperlipidemia, unspecified: Secondary | ICD-10-CM | POA: Diagnosis not present

## 2024-03-14 DIAGNOSIS — Z79899 Other long term (current) drug therapy: Secondary | ICD-10-CM | POA: Diagnosis not present

## 2024-03-14 DIAGNOSIS — I10 Essential (primary) hypertension: Secondary | ICD-10-CM | POA: Diagnosis not present

## 2024-03-14 DIAGNOSIS — F0280 Dementia in other diseases classified elsewhere without behavioral disturbance: Secondary | ICD-10-CM | POA: Diagnosis not present

## 2024-03-14 DIAGNOSIS — G309 Alzheimer's disease, unspecified: Secondary | ICD-10-CM | POA: Diagnosis not present

## 2024-03-14 DIAGNOSIS — S80822A Blister (nonthermal), left lower leg, initial encounter: Secondary | ICD-10-CM | POA: Diagnosis not present

## 2024-03-14 DIAGNOSIS — I87312 Chronic venous hypertension (idiopathic) with ulcer of left lower extremity: Secondary | ICD-10-CM | POA: Diagnosis not present

## 2024-03-16 DIAGNOSIS — I10 Essential (primary) hypertension: Secondary | ICD-10-CM | POA: Diagnosis not present

## 2024-03-16 DIAGNOSIS — R001 Bradycardia, unspecified: Secondary | ICD-10-CM | POA: Diagnosis not present

## 2024-03-16 DIAGNOSIS — F0280 Dementia in other diseases classified elsewhere without behavioral disturbance: Secondary | ICD-10-CM | POA: Diagnosis not present

## 2024-03-16 DIAGNOSIS — I872 Venous insufficiency (chronic) (peripheral): Secondary | ICD-10-CM | POA: Diagnosis not present

## 2024-03-16 DIAGNOSIS — G309 Alzheimer's disease, unspecified: Secondary | ICD-10-CM | POA: Diagnosis not present

## 2024-03-16 DIAGNOSIS — L97821 Non-pressure chronic ulcer of other part of left lower leg limited to breakdown of skin: Secondary | ICD-10-CM | POA: Diagnosis not present

## 2024-03-16 DIAGNOSIS — I2510 Atherosclerotic heart disease of native coronary artery without angina pectoris: Secondary | ICD-10-CM | POA: Diagnosis not present

## 2024-03-18 ENCOUNTER — Ambulatory Visit: Attending: Interventional Cardiology

## 2024-03-18 DIAGNOSIS — Z5181 Encounter for therapeutic drug level monitoring: Secondary | ICD-10-CM

## 2024-03-18 DIAGNOSIS — D6859 Other primary thrombophilia: Secondary | ICD-10-CM | POA: Diagnosis not present

## 2024-03-18 LAB — POCT INR: INR: 2.1 (ref 2.0–3.0)

## 2024-03-18 NOTE — Patient Instructions (Signed)
 continue taking 1/2 tablet daily EXCEPT 1 tablet on Thursdays.  Stay consistent with greens and ensure each week (decreased Ensure to 2 time per day) Recheck INR in 5 weeks.  Coumadin Clinic [phone]

## 2024-03-19 DIAGNOSIS — F0280 Dementia in other diseases classified elsewhere without behavioral disturbance: Secondary | ICD-10-CM | POA: Diagnosis not present

## 2024-03-19 DIAGNOSIS — G309 Alzheimer's disease, unspecified: Secondary | ICD-10-CM | POA: Diagnosis not present

## 2024-03-19 DIAGNOSIS — I10 Essential (primary) hypertension: Secondary | ICD-10-CM | POA: Diagnosis not present

## 2024-03-19 DIAGNOSIS — I872 Venous insufficiency (chronic) (peripheral): Secondary | ICD-10-CM | POA: Diagnosis not present

## 2024-03-19 DIAGNOSIS — I2510 Atherosclerotic heart disease of native coronary artery without angina pectoris: Secondary | ICD-10-CM | POA: Diagnosis not present

## 2024-03-19 DIAGNOSIS — L97821 Non-pressure chronic ulcer of other part of left lower leg limited to breakdown of skin: Secondary | ICD-10-CM | POA: Diagnosis not present

## 2024-03-21 DIAGNOSIS — L97821 Non-pressure chronic ulcer of other part of left lower leg limited to breakdown of skin: Secondary | ICD-10-CM | POA: Diagnosis not present

## 2024-03-21 DIAGNOSIS — G309 Alzheimer's disease, unspecified: Secondary | ICD-10-CM | POA: Diagnosis not present

## 2024-03-21 DIAGNOSIS — I2510 Atherosclerotic heart disease of native coronary artery without angina pectoris: Secondary | ICD-10-CM | POA: Diagnosis not present

## 2024-03-21 DIAGNOSIS — F0280 Dementia in other diseases classified elsewhere without behavioral disturbance: Secondary | ICD-10-CM | POA: Diagnosis not present

## 2024-03-21 DIAGNOSIS — I10 Essential (primary) hypertension: Secondary | ICD-10-CM | POA: Diagnosis not present

## 2024-03-21 DIAGNOSIS — I872 Venous insufficiency (chronic) (peripheral): Secondary | ICD-10-CM | POA: Diagnosis not present

## 2024-03-23 DIAGNOSIS — F0280 Dementia in other diseases classified elsewhere without behavioral disturbance: Secondary | ICD-10-CM | POA: Diagnosis not present

## 2024-03-23 DIAGNOSIS — L97821 Non-pressure chronic ulcer of other part of left lower leg limited to breakdown of skin: Secondary | ICD-10-CM | POA: Diagnosis not present

## 2024-03-23 DIAGNOSIS — I2510 Atherosclerotic heart disease of native coronary artery without angina pectoris: Secondary | ICD-10-CM | POA: Diagnosis not present

## 2024-03-23 DIAGNOSIS — G309 Alzheimer's disease, unspecified: Secondary | ICD-10-CM | POA: Diagnosis not present

## 2024-03-23 DIAGNOSIS — I872 Venous insufficiency (chronic) (peripheral): Secondary | ICD-10-CM | POA: Diagnosis not present

## 2024-03-23 DIAGNOSIS — I10 Essential (primary) hypertension: Secondary | ICD-10-CM | POA: Diagnosis not present

## 2024-03-24 ENCOUNTER — Ambulatory Visit (HOSPITAL_COMMUNITY): Attending: Cardiology

## 2024-03-24 DIAGNOSIS — I25119 Atherosclerotic heart disease of native coronary artery with unspecified angina pectoris: Secondary | ICD-10-CM | POA: Diagnosis not present

## 2024-03-24 LAB — ECHOCARDIOGRAM COMPLETE
Area-P 1/2: 2.02 cm2
S' Lateral: 2.8 cm

## 2024-03-25 DIAGNOSIS — I2510 Atherosclerotic heart disease of native coronary artery without angina pectoris: Secondary | ICD-10-CM | POA: Diagnosis not present

## 2024-03-25 DIAGNOSIS — I10 Essential (primary) hypertension: Secondary | ICD-10-CM | POA: Diagnosis not present

## 2024-03-25 DIAGNOSIS — I872 Venous insufficiency (chronic) (peripheral): Secondary | ICD-10-CM | POA: Diagnosis not present

## 2024-03-25 DIAGNOSIS — L97821 Non-pressure chronic ulcer of other part of left lower leg limited to breakdown of skin: Secondary | ICD-10-CM | POA: Diagnosis not present

## 2024-03-25 DIAGNOSIS — F0280 Dementia in other diseases classified elsewhere without behavioral disturbance: Secondary | ICD-10-CM | POA: Diagnosis not present

## 2024-03-25 DIAGNOSIS — G309 Alzheimer's disease, unspecified: Secondary | ICD-10-CM | POA: Diagnosis not present

## 2024-03-28 DIAGNOSIS — G309 Alzheimer's disease, unspecified: Secondary | ICD-10-CM | POA: Diagnosis not present

## 2024-03-28 DIAGNOSIS — I872 Venous insufficiency (chronic) (peripheral): Secondary | ICD-10-CM | POA: Diagnosis not present

## 2024-03-28 DIAGNOSIS — L97821 Non-pressure chronic ulcer of other part of left lower leg limited to breakdown of skin: Secondary | ICD-10-CM | POA: Diagnosis not present

## 2024-03-28 DIAGNOSIS — I2510 Atherosclerotic heart disease of native coronary artery without angina pectoris: Secondary | ICD-10-CM | POA: Diagnosis not present

## 2024-03-28 DIAGNOSIS — F0280 Dementia in other diseases classified elsewhere without behavioral disturbance: Secondary | ICD-10-CM | POA: Diagnosis not present

## 2024-03-28 DIAGNOSIS — I10 Essential (primary) hypertension: Secondary | ICD-10-CM | POA: Diagnosis not present

## 2024-03-30 ENCOUNTER — Other Ambulatory Visit: Payer: Self-pay | Admitting: *Deleted

## 2024-03-30 ENCOUNTER — Encounter: Payer: Self-pay | Admitting: *Deleted

## 2024-03-30 DIAGNOSIS — F0280 Dementia in other diseases classified elsewhere without behavioral disturbance: Secondary | ICD-10-CM | POA: Diagnosis not present

## 2024-03-30 DIAGNOSIS — M7989 Other specified soft tissue disorders: Secondary | ICD-10-CM

## 2024-03-30 DIAGNOSIS — I2510 Atherosclerotic heart disease of native coronary artery without angina pectoris: Secondary | ICD-10-CM | POA: Diagnosis not present

## 2024-03-30 DIAGNOSIS — I25118 Atherosclerotic heart disease of native coronary artery with other forms of angina pectoris: Secondary | ICD-10-CM

## 2024-03-30 DIAGNOSIS — L97821 Non-pressure chronic ulcer of other part of left lower leg limited to breakdown of skin: Secondary | ICD-10-CM | POA: Diagnosis not present

## 2024-03-30 DIAGNOSIS — G309 Alzheimer's disease, unspecified: Secondary | ICD-10-CM | POA: Diagnosis not present

## 2024-03-30 DIAGNOSIS — I872 Venous insufficiency (chronic) (peripheral): Secondary | ICD-10-CM | POA: Diagnosis not present

## 2024-03-30 DIAGNOSIS — R001 Bradycardia, unspecified: Secondary | ICD-10-CM

## 2024-03-30 DIAGNOSIS — I10 Essential (primary) hypertension: Secondary | ICD-10-CM | POA: Diagnosis not present

## 2024-04-01 ENCOUNTER — Other Ambulatory Visit: Payer: Self-pay | Admitting: Interventional Cardiology

## 2024-04-01 DIAGNOSIS — G309 Alzheimer's disease, unspecified: Secondary | ICD-10-CM | POA: Diagnosis not present

## 2024-04-01 DIAGNOSIS — I10 Essential (primary) hypertension: Secondary | ICD-10-CM | POA: Diagnosis not present

## 2024-04-01 DIAGNOSIS — F0280 Dementia in other diseases classified elsewhere without behavioral disturbance: Secondary | ICD-10-CM | POA: Diagnosis not present

## 2024-04-01 DIAGNOSIS — L97821 Non-pressure chronic ulcer of other part of left lower leg limited to breakdown of skin: Secondary | ICD-10-CM | POA: Diagnosis not present

## 2024-04-01 DIAGNOSIS — I872 Venous insufficiency (chronic) (peripheral): Secondary | ICD-10-CM | POA: Diagnosis not present

## 2024-04-01 DIAGNOSIS — I2510 Atherosclerotic heart disease of native coronary artery without angina pectoris: Secondary | ICD-10-CM | POA: Diagnosis not present

## 2024-04-01 MED ORDER — AMLODIPINE BESYLATE 5 MG PO TABS: 7.5000 mg | ORAL_TABLET | Freq: Every day | ORAL | 3 refills | Status: DC

## 2024-04-01 MED ORDER — FENOFIBRATE 145 MG PO TABS: 145.0000 mg | ORAL_TABLET | Freq: Every day | ORAL | 3 refills | Status: DC

## 2024-04-01 NOTE — Addendum Note (Signed)
 Addended by: Debrah Fan, Younique Casad L on: 04/01/2024 01:22 PM   Modules accepted: Orders

## 2024-04-04 ENCOUNTER — Other Ambulatory Visit (HOSPITAL_COMMUNITY): Payer: Self-pay

## 2024-04-04 ENCOUNTER — Other Ambulatory Visit: Payer: Self-pay

## 2024-04-04 DIAGNOSIS — Z556 Problems related to health literacy: Secondary | ICD-10-CM | POA: Diagnosis not present

## 2024-04-04 DIAGNOSIS — Z9181 History of falling: Secondary | ICD-10-CM | POA: Diagnosis not present

## 2024-04-04 DIAGNOSIS — L97821 Non-pressure chronic ulcer of other part of left lower leg limited to breakdown of skin: Secondary | ICD-10-CM | POA: Diagnosis not present

## 2024-04-04 DIAGNOSIS — L97822 Non-pressure chronic ulcer of other part of left lower leg with fat layer exposed: Secondary | ICD-10-CM | POA: Diagnosis not present

## 2024-04-04 DIAGNOSIS — Z951 Presence of aortocoronary bypass graft: Secondary | ICD-10-CM | POA: Diagnosis not present

## 2024-04-04 DIAGNOSIS — G309 Alzheimer's disease, unspecified: Secondary | ICD-10-CM | POA: Diagnosis not present

## 2024-04-04 DIAGNOSIS — F0280 Dementia in other diseases classified elsewhere without behavioral disturbance: Secondary | ICD-10-CM | POA: Diagnosis not present

## 2024-04-04 DIAGNOSIS — I87312 Chronic venous hypertension (idiopathic) with ulcer of left lower extremity: Secondary | ICD-10-CM | POA: Diagnosis not present

## 2024-04-04 DIAGNOSIS — I10 Essential (primary) hypertension: Secondary | ICD-10-CM | POA: Diagnosis not present

## 2024-04-04 DIAGNOSIS — I2510 Atherosclerotic heart disease of native coronary artery without angina pectoris: Secondary | ICD-10-CM | POA: Diagnosis not present

## 2024-04-04 DIAGNOSIS — S80822A Blister (nonthermal), left lower leg, initial encounter: Secondary | ICD-10-CM | POA: Diagnosis not present

## 2024-04-04 DIAGNOSIS — I872 Venous insufficiency (chronic) (peripheral): Secondary | ICD-10-CM | POA: Diagnosis not present

## 2024-04-04 MED ORDER — FENOFIBRATE 145 MG PO TABS
145.0000 mg | ORAL_TABLET | Freq: Every day | ORAL | 3 refills | Status: DC
  Filled 2024-04-04: qty 90, 90d supply, fill #0

## 2024-04-06 ENCOUNTER — Other Ambulatory Visit: Payer: Self-pay | Admitting: *Deleted

## 2024-04-06 DIAGNOSIS — G309 Alzheimer's disease, unspecified: Secondary | ICD-10-CM | POA: Diagnosis not present

## 2024-04-06 DIAGNOSIS — F0280 Dementia in other diseases classified elsewhere without behavioral disturbance: Secondary | ICD-10-CM | POA: Diagnosis not present

## 2024-04-06 DIAGNOSIS — I10 Essential (primary) hypertension: Secondary | ICD-10-CM | POA: Diagnosis not present

## 2024-04-06 DIAGNOSIS — I2510 Atherosclerotic heart disease of native coronary artery without angina pectoris: Secondary | ICD-10-CM | POA: Diagnosis not present

## 2024-04-06 DIAGNOSIS — I872 Venous insufficiency (chronic) (peripheral): Secondary | ICD-10-CM | POA: Diagnosis not present

## 2024-04-06 DIAGNOSIS — L97821 Non-pressure chronic ulcer of other part of left lower leg limited to breakdown of skin: Secondary | ICD-10-CM | POA: Diagnosis not present

## 2024-04-07 ENCOUNTER — Inpatient Hospital Stay

## 2024-04-07 ENCOUNTER — Inpatient Hospital Stay: Attending: Internal Medicine | Admitting: Internal Medicine

## 2024-04-07 VITALS — BP 117/66 | HR 50 | Temp 98.5°F | Resp 15 | Ht 73.0 in | Wt 151.4 lb

## 2024-04-07 DIAGNOSIS — Z7901 Long term (current) use of anticoagulants: Secondary | ICD-10-CM | POA: Diagnosis not present

## 2024-04-07 NOTE — Progress Notes (Signed)
 [facility] [phone]   [phone]  CONSULT NOTE  REFERRING PHYSICIAN: Wendie Hamburg, MD   REASON FOR CONSULTATION:  78 years old white male with chronic hypercoagulability abnormality  HPI ABBEY Lloyd is a 78 y.o. male came to the clinic today for initial evaluation accompanied by his wife [name]. Discussed the use of AI scribe software for clinical note transcription with the patient, who gave verbal consent to proceed.  History of Present Illness   [name] "[name]" is a 78 year old male with a hypercoagulable abnormality who presents for evaluation of his anticoagulation management. He is accompanied by his wife, [name]. He was referred by his cardiologist for evaluation of his hypercoagulable abnormality.  He was diagnosed with a hypercoagulable abnormality in September 1996 following postoperative open-heart surgery, with elevated fibrinopeptide A and mild resistance to activated protein C. He has been on Coumadin  since then.  He takes Coumadin  2.5 mg daily, except on Thursdays when he takes 5 mg. His INR is maintained around 2.5, though it occasionally fluctuates, reaching up to 4.1. Despite a regulated diet, the cause of INR variability is unclear. He is routinely monitored by a Coumadin  clinic.  He has a history of coronary artery disease, having undergone bypass surgery at a young age, as well as asthma, hypertension, hyperlipidemia, and sinus bradycardia. No history of diabetes, stroke, or blood clots in his legs or lungs.  His mother had phlebitis, and his father had lymphoma and prostate cancer. There is no known history of deep venous thrombosis or other blood disorders in his siblings.  He is a retired Investment banker, operational. He smoked for several years but quit in the 1980s. He no longer consumes alcohol and has no history of drug abuse. He is allergic to nitroglycerin , which causes severe headaches.  No new  chest pain, shortness of breath, cough, or history of blood clots in his legs or lungs.      HPI  Past Medical History:  Diagnosis Date   Allergy    Anemia    Anxiety    Arthritis    CAD (coronary artery disease) 1996   POST CABG   Cataracts, bilateral    Chicken pox    Dyslipidemia    Hyperlipidemia    Hypertension    Sinus bradycardia     Past Surgical History:  Procedure Laterality Date   BYPASS GRAFT  1996   CATARACT EXTRACTION Right    COLONOSCOPY WITH PROPOFOL  N/A 11/20/2022   Procedure: COLONOSCOPY WITH PROPOFOL ;  Surgeon: Nannette Babe, MD;  Location: [facility] ENDOSCOPY;  Service: Gastroenterology;  Laterality: N/A;   CORONARY ARTERY BYPASS GRAFT  6.1996   CABG x3 (SVGs, IMA)   EYE SURGERY  12.2017   HERNIA REPAIR  9.2003   Umbilical.   INGUINAL HERNIA REPAIR Right 04/09/2020   Procedure: LAPAROSCOPIC RIGHT INGUINAL HERNIA REPAIR WITH MESH;  Surgeon: Shela Derby, MD;  Location: [facility] OR;  Service: General;  Laterality: Right;   POLYPECTOMY  11/20/2022   Procedure: POLYPECTOMY;  Surgeon: Nannette Babe, MD;  Location: [facility] ENDOSCOPY;  Service: Gastroenterology;;   REFRACTIVE SURGERY Right    SKIN BIOPSY     head x 2   UMBILICAL HERNIA REPAIR  2003    Family History  Problem Relation Age of Onset   Alzheimer's disease Mother    Cancer Father        prostate   Prostate cancer Father    AAA (  abdominal aortic aneurysm) Sister    Hypertension Maternal Grandmother    Hypertension Paternal Grandmother    Colon cancer Other        great uncle   Esophageal cancer Neg Hx    Pancreatic cancer Neg Hx    Liver disease Neg Hx    Stomach cancer Neg Hx     Social History Social History   Tobacco Use   Smoking status: Former    Current packs/day: 0.00    Types: Cigarettes    Quit date: 05/29/1982    Years since quitting: 41.8   Smokeless tobacco: Never  Vaping Use   Vaping status: Never Used  Substance Use Topics   Alcohol use: Not Currently    Comment: 0-2  drinks a night   Drug use: Never    Allergies  Allergen Reactions   Other     Environmental allergies   Imdur  [Isosorbide  Nitrate] Other (See Comments)    headache   Latex     Itchy hands    Current Outpatient Medications  Medication Sig Dispense Refill   amLODipine  (NORVASC ) 5 MG tablet Take 1.5 tablets (7.5 mg total) by mouth daily. 135 tablet 3   cholecalciferol (VITAMIN D ) 1000 units tablet Take 1,000 Units by mouth daily.      Cyanocobalamin  (VITAMIN B12) 500 MCG TABS Take 500 mcg by mouth daily.      divalproex  (DEPAKOTE ) 125 MG DR tablet Take 1 tab at night for 1 week, then increase to 2 tablets at night May take it in the morning if needed 180 tablet 3   donepezil  (ARICEPT ) 10 MG tablet TAKE 1 TABLET AT BEDTIME (Patient not taking: Reported on 02/26/2024) 90 tablet 3   ezetimibe  (ZETIA ) 10 MG tablet TAKE 1 TABLET AT BEDTIME 90 tablet 3   fenofibrate  (TRICOR ) 145 MG tablet Take 1 tablet (145 mg total) by mouth at bedtime. 90 tablet 3   finasteride (PROSCAR) 5 MG tablet Take 5 mg by mouth daily.     furosemide  (LASIX ) 40 MG tablet Take 1 tablet (40 mg total) by mouth every other day. 90 tablet 3   LORazepam  (ATIVAN ) 0.5 MG tablet Take 0.5 tablets (0.25 mg total) by mouth 2 (two) times daily as needed for anxiety. 30 tablet 0   losartan  (COZAAR ) 50 MG tablet Take 1 tablet (50 mg total) by mouth daily. 90 tablet 2   memantine  (NAMENDA ) 10 MG tablet Take 1 tablet (10 mg total) by mouth 2 (two) times daily. 180 tablet 3   MOVANTIK  25 MG TABS tablet TAKE 1 TABLET(25 MG) BY MOUTH DAILY 30 tablet 1   nitroGLYCERIN  (NITROSTAT ) 0.4 MG SL tablet Place 1 tablet (0.4 mg total) under the tongue every 5 (five) minutes as needed for chest pain (Up to 3 doses in one 24 hour period, if needing more, please go to Emergency Dept. to be evaluated). 25 tablet 11   oxyCODONE -acetaminophen  (PERCOCET) 7.5-325 MG tablet Take 0.5-1 tablets by mouth 2 (two) times daily as needed for severe pain (pain score  7-10). 30 tablet 0   ranolazine  (RANEXA ) 500 MG 12 hr tablet Take 1 tablet (500 mg total) by mouth 2 (two) times daily. 60 tablet 0   rosuvastatin  (CRESTOR ) 40 MG tablet TAKE 1 TABLET DAILY 90 tablet 3   tamsulosin (FLOMAX) 0.4 MG CAPS capsule Take 0.4 mg by mouth daily after supper.     warfarin (COUMADIN ) 5 MG tablet TAKE AS DIRECTED BY COUMADIN  CLINIC (Patient taking differently: Take 5 mg  by mouth See admin instructions. Take 2.5 mg on Mon,Wed,Fri,Sat,and Sun. 5 mg on Tue and Thu.) 80 tablet 3   No current facility-administered medications for this visit.    Review of Systems  Constitutional: positive for fatigue Eyes: negative Ears, nose, mouth, throat, and face: negative Respiratory: negative Cardiovascular: negative Gastrointestinal: negative Genitourinary:negative Integument/breast: negative Hematologic/lymphatic: negative Musculoskeletal:positive for arthralgias Neurological: negative Behavioral/Psych: negative Endocrine: negative Allergic/Immunologic: negative  Physical Exam  QMV:HQION, healthy, no distress, well nourished, and well developed SKIN: skin color, texture, turgor are normal, no rashes or significant lesions HEAD: Normocephalic, No masses, lesions, tenderness or abnormalities EYES: normal, PERRLA, Conjunctiva are pink and non-injected EARS: External ears normal, Canals clear OROPHARYNX:no exudate, no erythema, and lips, buccal mucosa, and tongue normal  NECK: supple, no adenopathy, no JVD LYMPH:  no palpable lymphadenopathy, no hepatosplenomegaly LUNGS: clear to auscultation , and palpation HEART: regular rate & rhythm, no murmurs, and no gallops ABDOMEN:abdomen soft, non-tender, normal bowel sounds, and no masses or organomegaly BACK: Back symmetric, no curvature., No CVA tenderness EXTREMITIES: 2+ edema bilaterally NEURO: alert & oriented x 3 with fluent speech, no focal motor/sensory deficits  PERFORMANCE STATUS: ECOG 1  LABORATORY DATA: Lab  Results  Component Value Date   WBC 3.5 02/26/2024   HGB 9.4 (L) 02/26/2024   HCT 28.6 (L) 02/26/2024   MCV 101 (H) 02/26/2024   PLT 198 02/26/2024      Chemistry      Component Value Date/Time   NA 145 (H) 02/26/2024 1459   K 4.5 02/26/2024 1459   CL 107 (H) 02/26/2024 1459   CO2 26 02/26/2024 1459   BUN 26 02/26/2024 1459   CREATININE 1.29 (H) 02/26/2024 1459      Component Value Date/Time   CALCIUM  10.4 (H) 02/26/2024 1459   ALKPHOS 27 (L) 02/26/2024 1459   AST 24 02/26/2024 1459   ALT 15 02/26/2024 1459   BILITOT 0.2 02/26/2024 1459       RADIOGRAPHIC STUDIES: LONG TERM MONITOR (3-14 DAYS) Result Date: 03/30/2024   No significant arrhythmias   Low resting heart rate, average rate 58 bpm Patch Wear Time:  6 days and 23 hours (2025-03-26T16:14:43-0400 to 2025-04-02T15:51:48-0400) Patient had a min HR of 35 bpm, max HR of 152 bpm, and avg HR of 58 bpm. Predominant underlying rhythm was Sinus Rhythm. Intermittent Bundle Branch Block was present. 2 Supraventricular Tachycardia runs occurred, the run with the fastest interval lasting  12 beats with a max rate of 152 bpm (avg 122 bpm); the run with the fastest interval was also the longest. Some episodes of Supraventricular Tachycardia may be possible Atrial Tachycardia with variable block. Idioventricular Rhythm was present. Isolated  SVEs were rare (<1.0%), SVE Couplets were rare (<1.0%), and SVE Triplets were rare (<1.0%). Isolated VEs were rare (<1.0%, 2232), VE Couplets were rare (<1.0%, 82), and VE Triplets were rare (<1.0%, 5). Ventricular Trigeminy was present.   ECHOCARDIOGRAM COMPLETE Result Date: 03/24/2024    ECHOCARDIOGRAM REPORT   Patient Name:   [name] Date of Exam: 03/24/2024 Medical Rec #:  [id number]         Height:       73.0 in Accession #:    [id number]        Weight:       160.0 lb Date of Birth:  January 01, 1946        BSA:          1.957 m Patient Age:    20 years  BP:           110/56 mmHg Patient  Gender: M                 HR:           63 bpm. Exam Location:  [address] Procedure: 2D Echo, Cardiac Doppler and Color Doppler (Both Spectral and Color            Flow Doppler were utilized during procedure). Indications:    I25.119 CAD  History:        Patient has no prior history of Echocardiogram examinations.                 CAD, Arrythmias:Bradycardia; Risk Factors:Hypertension,                 Dyslipidemia and Former Smoker.  Sonographer:    Lula Sale RDCS Referring Phys: [id number] Wendie Hamburg  Sonographer Comments: Image acquisition challenging due to patient body habitus and Image acquisition challenging due to respiratory motion. IMPRESSIONS  1. Left ventricular ejection fraction, by estimation, is 60 to 65%. The left ventricle has normal function. The left ventricle has no regional wall motion abnormalities. There is moderate concentric left ventricular hypertrophy. Left ventricular diastolic function could not be evaluated.  2. Right ventricular systolic function is normal. The right ventricular size is normal.  3. Left atrial size was moderately dilated.  4. The mitral valve is normal in structure. Mild mitral valve regurgitation. No evidence of mitral stenosis.  5. The aortic valve is tricuspid. Aortic valve regurgitation is trivial. Aortic valve sclerosis/calcification is present, without any evidence of aortic stenosis.  6. Aortic dilatation noted. There is mild dilatation of the aortic root, measuring 44 mm. FINDINGS  Left Ventricle: Left ventricular ejection fraction, by estimation, is 60 to 65%. The left ventricle has normal function. The left ventricle has no regional wall motion abnormalities. The left ventricular internal cavity size was normal in size. There is  moderate concentric left ventricular hypertrophy. Left ventricular diastolic function could not be evaluated. Right Ventricle: The right ventricular size is normal. No increase in right ventricular wall thickness.  Right ventricular systolic function is normal. Left Atrium: Left atrial size was moderately dilated. Right Atrium: Right atrial size was normal in size. Pericardium: There is no evidence of pericardial effusion. Mitral Valve: The mitral valve is normal in structure. Mild mitral valve regurgitation. No evidence of mitral valve stenosis. Tricuspid Valve: The tricuspid valve is normal in structure. Tricuspid valve regurgitation is trivial. No evidence of tricuspid stenosis. Aortic Valve: The aortic valve is tricuspid. Aortic valve regurgitation is trivial. Aortic valve sclerosis/calcification is present, without any evidence of aortic stenosis. Pulmonic Valve: The pulmonic valve was normal in structure. Pulmonic valve regurgitation is trivial. No evidence of pulmonic stenosis. Aorta: Aortic dilatation noted. There is mild dilatation of the aortic root, measuring 44 mm. Venous: The inferior vena cava was not well visualized. IAS/Shunts: No atrial level shunt detected by color flow Doppler.  LEFT VENTRICLE PLAX 2D LVIDd:         4.10 cm LVIDs:         2.80 cm LV PW:         1.40 cm LV IVS:        1.40 cm LVOT diam:     2.30 cm LV SV:         68 LV SV Index:   35 LVOT Area:     4.15 cm  RIGHT VENTRICLE  RVSP:           24.3 mmHg LEFT ATRIUM             Index        RIGHT ATRIUM           Index LA diam:        5.20 cm 2.66 cm/m   RA Pressure: 3.00 mmHg LA Vol (A2C):   92.1 ml 47.06 ml/m  RA Area:     18.20 cm LA Vol (A4C):   68.2 ml 34.85 ml/m  RA Volume:   43.70 ml  22.33 ml/m LA Biplane Vol: 80.3 ml 41.03 ml/m  AORTIC VALVE LVOT Vmax:   76.50 cm/s LVOT Vmean:  46.700 cm/s LVOT VTI:    0.163 m  AORTA Ao Root diam: 4.40 cm Ao Asc diam:  3.70 cm MITRAL VALVE               TRICUSPID VALVE MV Area (PHT): 2.02 cm    TR Peak grad:   21.3 mmHg MV Decel Time: 375 msec    TR Vmax:        231.00 cm/s MV E velocity: 72.80 cm/s  Estimated RAP:  3.00 mmHg MV A velocity: 86.50 cm/s  RVSP:           24.3 mmHg MV E/A ratio:  0.84                             SHUNTS                            Systemic VTI:  0.16 m                            Systemic Diam: 2.30 cm Gaylyn Keas MD Electronically signed by Gaylyn Keas MD Signature Date/Time: 03/24/2024/3:28:17 PM    Final     ASSESSMENT AND PLAN: This is a very pleasant 78 years old white male with history of early onset coronary artery disease and hypercoagulable panel in 1996 showed 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  The patient has normal factor V Leiden.  He also had markedly elevated Fibryna peptide a level and mild resistant to activated protein C and relative aspirin resistant with respect to ADP-induced platelet aggregation.  He has been on treatment with Coumadin  since that time.    Hypercoagulable state Chronic hypercoagulable state diagnosed in 1996 postoperatively after open heart surgery, with elevated fibrinopeptide A and mild resistance to activated protein C. Currently managed with Coumadin , maintaining INR around 2.5. Discussion about switching to newer anticoagulants like Eliquis or Xarelto, but decided against it due to well-managed condition on Coumadin , cost considerations, and potential risks of newer medications. - Continue Coumadin  2.5 mg daily except 5 mg on Thursday. - Repeating Hypercoagulable panel will not accurate while on Coumadin  and he can not discontinue anticoagulation to do the test. - Monitor INR regularly through the Coumadin  clinic.  Coronary artery disease with history of bypass Coronary artery disease with bypass surgery performed at age 36. No new symptoms such as chest pain or dyspnea reported.  Hypertension Hypertension. No specific discussion or changes in management during this visit.  Hyperlipidemia Hyperlipidemia. No specific discussion or changes in management during this  visit.  Asthma Asthma. No specific discussion or changes in management during this visit.  Sinus bradycardia Sinus bradycardia. No specific discussion or changes in management during this visit.  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 No specific discussion or changes in management during this visit.   The patient was advised to call if he has any concerning symptoms.  I will see him on as-needed basis at this point.   The patient voices understanding of current disease status and treatment options and is in agreement with the current care plan.  All questions were answered. The patient knows to call the clinic with any problems, questions or concerns. We can certainly see the patient much sooner if necessary.  Thank you so much for allowing me to participate in the care of [name]. I will continue to follow up the patient with you and assist in his care.  The total time spent in the appointment was 60 minutes.  Disclaimer: This note was dictated with voice recognition software. Similar sounding words can inadvertently be transcribed and may not be corrected upon review.   Aurelio Blower Apr 07, 2024, 12:12 PM

## 2024-04-08 DIAGNOSIS — L97821 Non-pressure chronic ulcer of other part of left lower leg limited to breakdown of skin: Secondary | ICD-10-CM | POA: Diagnosis not present

## 2024-04-08 DIAGNOSIS — F0280 Dementia in other diseases classified elsewhere without behavioral disturbance: Secondary | ICD-10-CM | POA: Diagnosis not present

## 2024-04-08 DIAGNOSIS — G309 Alzheimer's disease, unspecified: Secondary | ICD-10-CM | POA: Diagnosis not present

## 2024-04-08 DIAGNOSIS — I10 Essential (primary) hypertension: Secondary | ICD-10-CM | POA: Diagnosis not present

## 2024-04-08 DIAGNOSIS — I2510 Atherosclerotic heart disease of native coronary artery without angina pectoris: Secondary | ICD-10-CM | POA: Diagnosis not present

## 2024-04-08 DIAGNOSIS — I872 Venous insufficiency (chronic) (peripheral): Secondary | ICD-10-CM | POA: Diagnosis not present

## 2024-04-13 DIAGNOSIS — I872 Venous insufficiency (chronic) (peripheral): Secondary | ICD-10-CM | POA: Diagnosis not present

## 2024-04-13 DIAGNOSIS — I10 Essential (primary) hypertension: Secondary | ICD-10-CM | POA: Diagnosis not present

## 2024-04-13 DIAGNOSIS — F0280 Dementia in other diseases classified elsewhere without behavioral disturbance: Secondary | ICD-10-CM | POA: Diagnosis not present

## 2024-04-13 DIAGNOSIS — I2510 Atherosclerotic heart disease of native coronary artery without angina pectoris: Secondary | ICD-10-CM | POA: Diagnosis not present

## 2024-04-13 DIAGNOSIS — L97821 Non-pressure chronic ulcer of other part of left lower leg limited to breakdown of skin: Secondary | ICD-10-CM | POA: Diagnosis not present

## 2024-04-13 DIAGNOSIS — G309 Alzheimer's disease, unspecified: Secondary | ICD-10-CM | POA: Diagnosis not present

## 2024-04-14 ENCOUNTER — Other Ambulatory Visit (HOSPITAL_COMMUNITY): Payer: Self-pay

## 2024-04-15 DIAGNOSIS — I2510 Atherosclerotic heart disease of native coronary artery without angina pectoris: Secondary | ICD-10-CM | POA: Diagnosis not present

## 2024-04-15 DIAGNOSIS — G309 Alzheimer's disease, unspecified: Secondary | ICD-10-CM | POA: Diagnosis not present

## 2024-04-15 DIAGNOSIS — I10 Essential (primary) hypertension: Secondary | ICD-10-CM | POA: Diagnosis not present

## 2024-04-15 DIAGNOSIS — F0280 Dementia in other diseases classified elsewhere without behavioral disturbance: Secondary | ICD-10-CM | POA: Diagnosis not present

## 2024-04-15 DIAGNOSIS — I872 Venous insufficiency (chronic) (peripheral): Secondary | ICD-10-CM | POA: Diagnosis not present

## 2024-04-15 DIAGNOSIS — L97821 Non-pressure chronic ulcer of other part of left lower leg limited to breakdown of skin: Secondary | ICD-10-CM | POA: Diagnosis not present

## 2024-04-18 DIAGNOSIS — I2510 Atherosclerotic heart disease of native coronary artery without angina pectoris: Secondary | ICD-10-CM | POA: Diagnosis not present

## 2024-04-18 DIAGNOSIS — G309 Alzheimer's disease, unspecified: Secondary | ICD-10-CM | POA: Diagnosis not present

## 2024-04-18 DIAGNOSIS — I10 Essential (primary) hypertension: Secondary | ICD-10-CM | POA: Diagnosis not present

## 2024-04-18 DIAGNOSIS — L97821 Non-pressure chronic ulcer of other part of left lower leg limited to breakdown of skin: Secondary | ICD-10-CM | POA: Diagnosis not present

## 2024-04-18 DIAGNOSIS — I872 Venous insufficiency (chronic) (peripheral): Secondary | ICD-10-CM | POA: Diagnosis not present

## 2024-04-18 DIAGNOSIS — F0280 Dementia in other diseases classified elsewhere without behavioral disturbance: Secondary | ICD-10-CM | POA: Diagnosis not present

## 2024-04-22 ENCOUNTER — Other Ambulatory Visit

## 2024-04-22 ENCOUNTER — Ambulatory Visit: Attending: Interventional Cardiology

## 2024-04-22 ENCOUNTER — Other Ambulatory Visit: Payer: Self-pay

## 2024-04-22 DIAGNOSIS — Z111 Encounter for screening for respiratory tuberculosis: Secondary | ICD-10-CM | POA: Diagnosis not present

## 2024-04-22 DIAGNOSIS — F0280 Dementia in other diseases classified elsewhere without behavioral disturbance: Secondary | ICD-10-CM | POA: Diagnosis not present

## 2024-04-22 DIAGNOSIS — Z5181 Encounter for therapeutic drug level monitoring: Secondary | ICD-10-CM | POA: Insufficient documentation

## 2024-04-22 DIAGNOSIS — I10 Essential (primary) hypertension: Secondary | ICD-10-CM | POA: Diagnosis not present

## 2024-04-22 DIAGNOSIS — G309 Alzheimer's disease, unspecified: Secondary | ICD-10-CM | POA: Diagnosis not present

## 2024-04-22 DIAGNOSIS — I2510 Atherosclerotic heart disease of native coronary artery without angina pectoris: Secondary | ICD-10-CM | POA: Diagnosis not present

## 2024-04-22 DIAGNOSIS — D6859 Other primary thrombophilia: Secondary | ICD-10-CM | POA: Diagnosis not present

## 2024-04-22 DIAGNOSIS — L97821 Non-pressure chronic ulcer of other part of left lower leg limited to breakdown of skin: Secondary | ICD-10-CM | POA: Diagnosis not present

## 2024-04-22 DIAGNOSIS — I872 Venous insufficiency (chronic) (peripheral): Secondary | ICD-10-CM | POA: Diagnosis not present

## 2024-04-22 LAB — POCT INR: INR: 1.8 — AB (ref 2.0–3.0)

## 2024-04-22 NOTE — Patient Instructions (Addendum)
 Description   Take 1 tablet today and then continue taking 1/2 tablet daily EXCEPT 1 tablet on Thursdays.  Stay consistent with greens and ensure each week (Ensure:1 time per day) Recheck INR in 4 weeks.  Coumadin  Clinic [phone]

## 2024-04-25 DIAGNOSIS — L97822 Non-pressure chronic ulcer of other part of left lower leg with fat layer exposed: Secondary | ICD-10-CM | POA: Diagnosis not present

## 2024-04-25 DIAGNOSIS — I872 Venous insufficiency (chronic) (peripheral): Secondary | ICD-10-CM | POA: Diagnosis not present

## 2024-04-26 LAB — QUANTIFERON-TB GOLD PLUS
Mitogen-NIL: >10 [IU]/mL
NIL: 0.01 [IU]/mL
QuantiFERON-TB Gold Plus: NEGATIVE
TB1-NIL: 0 [IU]/mL
TB2-NIL: 0 [IU]/mL

## 2024-04-27 DIAGNOSIS — F0280 Dementia in other diseases classified elsewhere without behavioral disturbance: Secondary | ICD-10-CM | POA: Diagnosis not present

## 2024-04-27 DIAGNOSIS — I2510 Atherosclerotic heart disease of native coronary artery without angina pectoris: Secondary | ICD-10-CM | POA: Diagnosis not present

## 2024-04-27 DIAGNOSIS — I872 Venous insufficiency (chronic) (peripheral): Secondary | ICD-10-CM | POA: Diagnosis not present

## 2024-04-27 DIAGNOSIS — L97821 Non-pressure chronic ulcer of other part of left lower leg limited to breakdown of skin: Secondary | ICD-10-CM | POA: Diagnosis not present

## 2024-04-27 DIAGNOSIS — I10 Essential (primary) hypertension: Secondary | ICD-10-CM | POA: Diagnosis not present

## 2024-04-27 DIAGNOSIS — G309 Alzheimer's disease, unspecified: Secondary | ICD-10-CM | POA: Diagnosis not present

## 2024-05-05 ENCOUNTER — Telehealth: Payer: Self-pay | Admitting: Physician Assistant

## 2024-05-05 NOTE — Telephone Encounter (Signed)
 I advised to wife to keep his appt, to reach out to PCP. She thanked me for calling. Will call back if she needs anything

## 2024-05-05 NOTE — Telephone Encounter (Signed)
 Pt. Wife wants sooner appt due to: violent outburst

## 2024-05-19 ENCOUNTER — Ambulatory Visit (INDEPENDENT_AMBULATORY_CARE_PROVIDER_SITE_OTHER): Payer: Self-pay | Admitting: Cardiology

## 2024-05-19 DIAGNOSIS — Z5181 Encounter for therapeutic drug level monitoring: Secondary | ICD-10-CM

## 2024-05-19 LAB — POCT INR: INR: 2.4 (ref 2.0–3.0)

## 2024-05-19 NOTE — Patient Instructions (Addendum)
 Description   Called and spoke to pt's wife and instructed for pt to hold warfarin tonight ( per Dr. Orlinda Blackbird request.) then continue taking warfarin 1/2 tablet daily EXCEPT 1 tablet on Thursdays.  Stay consistent with greens and ensure each week (Ensure:1 time per day) Recheck INR in 4 weeks.  Coumadin  Clinic [phone]

## 2024-05-20 ENCOUNTER — Ambulatory Visit

## 2024-05-29 ENCOUNTER — Emergency Department (HOSPITAL_COMMUNITY)
Admission: EM | Admit: 2024-05-29 | Discharge: 2024-05-29 | Disposition: A | Discharge status: Home / Self Care | Attending: Emergency Medicine | Admitting: Emergency Medicine

## 2024-05-29 ENCOUNTER — Emergency Department (HOSPITAL_COMMUNITY)

## 2024-05-29 DIAGNOSIS — W1830XA Fall on same level, unspecified, initial encounter: Secondary | ICD-10-CM | POA: Insufficient documentation

## 2024-05-29 DIAGNOSIS — R0789 Other chest pain: Secondary | ICD-10-CM | POA: Insufficient documentation

## 2024-05-29 DIAGNOSIS — K769 Liver disease, unspecified: Secondary | ICD-10-CM | POA: Insufficient documentation

## 2024-05-29 DIAGNOSIS — K5730 Diverticulosis of large intestine without perforation or abscess without bleeding: Secondary | ICD-10-CM | POA: Diagnosis not present

## 2024-05-29 DIAGNOSIS — I10 Essential (primary) hypertension: Secondary | ICD-10-CM | POA: Insufficient documentation

## 2024-05-29 DIAGNOSIS — I2510 Atherosclerotic heart disease of native coronary artery without angina pectoris: Secondary | ICD-10-CM | POA: Diagnosis not present

## 2024-05-29 DIAGNOSIS — R9089 Other abnormal findings on diagnostic imaging of central nervous system: Secondary | ICD-10-CM | POA: Diagnosis not present

## 2024-05-29 DIAGNOSIS — G309 Alzheimer's disease, unspecified: Secondary | ICD-10-CM | POA: Insufficient documentation

## 2024-05-29 DIAGNOSIS — M4802 Spinal stenosis, cervical region: Secondary | ICD-10-CM | POA: Diagnosis not present

## 2024-05-29 DIAGNOSIS — Z79899 Other long term (current) drug therapy: Secondary | ICD-10-CM | POA: Diagnosis not present

## 2024-05-29 DIAGNOSIS — E785 Hyperlipidemia, unspecified: Secondary | ICD-10-CM | POA: Diagnosis not present

## 2024-05-29 DIAGNOSIS — S199XXA Unspecified injury of neck, initial encounter: Secondary | ICD-10-CM | POA: Diagnosis not present

## 2024-05-29 DIAGNOSIS — D72819 Decreased white blood cell count, unspecified: Secondary | ICD-10-CM | POA: Insufficient documentation

## 2024-05-29 DIAGNOSIS — W19XXXA Unspecified fall, initial encounter: Secondary | ICD-10-CM

## 2024-05-29 DIAGNOSIS — D649 Anemia, unspecified: Secondary | ICD-10-CM | POA: Diagnosis not present

## 2024-05-29 DIAGNOSIS — R443 Hallucinations, unspecified: Secondary | ICD-10-CM | POA: Insufficient documentation

## 2024-05-29 DIAGNOSIS — F02818 Dementia in other diseases classified elsewhere, unspecified severity, with other behavioral disturbance: Secondary | ICD-10-CM | POA: Diagnosis not present

## 2024-05-29 DIAGNOSIS — R442 Other hallucinations: Secondary | ICD-10-CM | POA: Diagnosis not present

## 2024-05-29 DIAGNOSIS — S0990XA Unspecified injury of head, initial encounter: Secondary | ICD-10-CM | POA: Insufficient documentation

## 2024-05-29 DIAGNOSIS — Z91040 Latex allergy status: Secondary | ICD-10-CM | POA: Insufficient documentation

## 2024-05-29 DIAGNOSIS — M4312 Spondylolisthesis, cervical region: Secondary | ICD-10-CM | POA: Diagnosis not present

## 2024-05-29 DIAGNOSIS — I7121 Aneurysm of the ascending aorta, without rupture: Secondary | ICD-10-CM | POA: Diagnosis not present

## 2024-05-29 DIAGNOSIS — Z7901 Long term (current) use of anticoagulants: Secondary | ICD-10-CM | POA: Insufficient documentation

## 2024-05-29 DIAGNOSIS — I700 Atherosclerosis of aorta: Secondary | ICD-10-CM | POA: Diagnosis not present

## 2024-05-29 DIAGNOSIS — N200 Calculus of kidney: Secondary | ICD-10-CM | POA: Insufficient documentation

## 2024-05-29 DIAGNOSIS — F0280 Dementia in other diseases classified elsewhere without behavioral disturbance: Secondary | ICD-10-CM | POA: Diagnosis not present

## 2024-05-29 DIAGNOSIS — I119 Hypertensive heart disease without heart failure: Secondary | ICD-10-CM | POA: Diagnosis not present

## 2024-05-29 DIAGNOSIS — J9811 Atelectasis: Secondary | ICD-10-CM | POA: Diagnosis not present

## 2024-05-29 DIAGNOSIS — S3993XA Unspecified injury of pelvis, initial encounter: Secondary | ICD-10-CM | POA: Diagnosis not present

## 2024-05-29 DIAGNOSIS — S299XXA Unspecified injury of thorax, initial encounter: Secondary | ICD-10-CM | POA: Diagnosis not present

## 2024-05-29 DIAGNOSIS — S3991XA Unspecified injury of abdomen, initial encounter: Secondary | ICD-10-CM | POA: Diagnosis not present

## 2024-05-29 DIAGNOSIS — Z951 Presence of aortocoronary bypass graft: Secondary | ICD-10-CM | POA: Insufficient documentation

## 2024-05-29 DIAGNOSIS — R001 Bradycardia, unspecified: Secondary | ICD-10-CM | POA: Diagnosis not present

## 2024-05-29 DIAGNOSIS — Z043 Encounter for examination and observation following other accident: Secondary | ICD-10-CM | POA: Diagnosis not present

## 2024-05-29 LAB — CBC
HCT: 32.2 % — ABNORMAL LOW (ref 39.0–52.0)
Hemoglobin: 10.8 g/dL — ABNORMAL LOW (ref 13.0–17.0)
MCH: 32.7 pg (ref 26.0–34.0)
MCHC: 33.5 g/dL (ref 30.0–36.0)
MCV: 97.6 fL (ref 80.0–100.0)
Platelets: 169 10*3/uL (ref 150–400)
RBC: 3.3 MIL/uL — ABNORMAL LOW (ref 4.22–5.81)
RDW: 14.1 % (ref 11.5–15.5)
WBC: 3.4 10*3/uL — ABNORMAL LOW (ref 4.0–10.5)
nRBC: 0 % (ref 0.0–0.2)

## 2024-05-29 LAB — I-STAT CHEM 8, ED
BUN: 33 mg/dL — ABNORMAL HIGH (ref 8–23)
Calcium, Ion: 1.36 mmol/L (ref 1.15–1.40)
Chloride: 104 mmol/L (ref 98–111)
Creatinine, Ser: 1.9 mg/dL — ABNORMAL HIGH (ref 0.61–1.24)
Glucose, Bld: 98 mg/dL (ref 70–99)
HCT: 30 % — ABNORMAL LOW (ref 39.0–52.0)
Hemoglobin: 10.2 g/dL — ABNORMAL LOW (ref 13.0–17.0)
Potassium: 3.9 mmol/L (ref 3.5–5.1)
Sodium: 143 mmol/L (ref 135–145)
TCO2: 27 mmol/L (ref 22–32)

## 2024-05-29 LAB — COMPREHENSIVE METABOLIC PANEL WITH GFR
ALT: 35 U/L (ref 0–44)
AST: 31 U/L (ref 15–41)
Albumin: 3.8 g/dL (ref 3.5–5.0)
Alkaline Phosphatase: 19 U/L — ABNORMAL LOW (ref 38–126)
Anion gap: 9 (ref 5–15)
BUN: 31 mg/dL — ABNORMAL HIGH (ref 8–23)
CO2: 29 mmol/L (ref 22–32)
Calcium: 10.7 mg/dL — ABNORMAL HIGH (ref 8.9–10.3)
Chloride: 104 mmol/L (ref 98–111)
Creatinine, Ser: 1.75 mg/dL — ABNORMAL HIGH (ref 0.61–1.24)
GFR, Estimated: 40 mL/min — ABNORMAL LOW (ref >60)
Glucose, Bld: 102 mg/dL — ABNORMAL HIGH (ref 70–99)
Potassium: 4 mmol/L (ref 3.5–5.1)
Sodium: 142 mmol/L (ref 135–145)
Total Bilirubin: 0.4 mg/dL (ref 0.0–1.2)
Total Protein: 6 g/dL — ABNORMAL LOW (ref 6.5–8.1)

## 2024-05-29 LAB — URINALYSIS, ROUTINE W REFLEX MICROSCOPIC
Bilirubin Urine: NEGATIVE
Glucose, UA: NEGATIVE mg/dL
Hgb urine dipstick: NEGATIVE
Ketones, ur: NEGATIVE mg/dL
Leukocytes,Ua: NEGATIVE
Nitrite: NEGATIVE
Protein, ur: NEGATIVE mg/dL
Specific Gravity, Urine: 1.009 (ref 1.005–1.030)
pH: 7 (ref 5.0–8.0)

## 2024-05-29 LAB — I-STAT CG4 LACTIC ACID, ED: Lactic Acid, Venous: 0.6 mmol/L (ref 0.5–1.9)

## 2024-05-29 LAB — ETHANOL: Alcohol, Ethyl (B): <15 mg/dL (ref <15)

## 2024-05-29 LAB — PROTIME-INR
INR: 2.5 — ABNORMAL HIGH (ref 0.8–1.2)
Prothrombin Time: 26.8 s — ABNORMAL HIGH (ref 11.4–15.2)

## 2024-05-29 LAB — MAGNESIUM: Magnesium: 2 mg/dL (ref 1.7–2.4)

## 2024-05-29 MED ORDER — LACTATED RINGERS IV BOLUS
1000.0000 mL | Freq: Once | INTRAVENOUS | Status: AC
  Administered 2024-05-29: 1000 mL via INTRAVENOUS
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
## 2024-05-29 NOTE — Discharge Instructions (Signed)
 Test results today are reassuring.  In regard to the advancement of dementia and recent behavioral disturbances, you should hear from a social worker to help coordinate home health support.  Continue to work on this through primary care doctor as well.  Return to the emergency department for any new or worsening symptoms of concern.
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
## 2024-05-29 NOTE — ED Notes (Incomplete)
 Trauma Response Nurse Documentation   [name] is a 78 y.o. male arriving to [facility] ED via EMS  On {meds; anticoagulants:31417}. Trauma was activated as a Level 2 by Warren, RN based on the following trauma criteria Elderly patients > 65 with head trauma on anti-coagulation (excluding ASA).  Patient cleared for CT by Dr. Melvenia.  [facility] ***.  History   Past Medical History:  Diagnosis Date   Allergy    Anemia    Anxiety    Arthritis    CAD (coronary artery disease) 1996   POST CABG   Cataracts, bilateral    Chicken pox    Dyslipidemia    Hyperlipidemia    Hypertension    Sinus bradycardia      Past Surgical History:  Procedure Laterality Date   BYPASS GRAFT  1996   CATARACT EXTRACTION Right    COLONOSCOPY WITH PROPOFOL  N/A 11/20/2022   Procedure: COLONOSCOPY WITH PROPOFOL ;  Surgeon: Albertus Gordy HERO, MD;  Location: [facility] ENDOSCOPY;  Service: Gastroenterology;  Laterality: N/A;   CORONARY ARTERY BYPASS GRAFT  6.1996   CABG x3 (SVGs, IMA)   EYE SURGERY  12.2017   HERNIA REPAIR  9.2003   Umbilical.   INGUINAL HERNIA REPAIR Right 04/09/2020   Procedure: LAPAROSCOPIC RIGHT INGUINAL HERNIA REPAIR WITH MESH;  Surgeon: Rubin Calamity, MD;  Location: [facility] OR;  Service: General;  Laterality: Right;   POLYPECTOMY  11/20/2022   Procedure: POLYPECTOMY;  Surgeon: Albertus Gordy HERO, MD;  Location: [facility] ENDOSCOPY;  Service: Gastroenterology;;   REFRACTIVE SURGERY Right    SKIN BIOPSY     head x 2   UMBILICAL HERNIA REPAIR  2003       Initial Focused Assessment (If applicable, or please see trauma documentation): Airway - clear Breathing - unlabored Circulation - radial pulses present   CT's Completed:   {Trauma CT:26866}   Interventions:   Plan for disposition:  {Trauma Dispo:26867}   Consults completed:  {Trauma Consults:26862} at ***.  Event Summary:  MTP Summary (If applicable):   Bedside handoff with {Trauma handoff:26863::ED RN} ***.    Darice HERO Aleck Locklin  Trauma  Response RN  Please call TRN at [phone] for further assistance.

## 2024-05-29 NOTE — Progress Notes (Signed)
 Orthopedic Tech Progress Note Patient Details:  [name] 1946-11-23 [id number] Level 2 Trauma  Patient ID: KETIH GOODIE, male   DOB: [birth year], 78 y.o.   MRN: [id number]  Massie FORBES Bar 05/29/2024, 2:43 PM

## 2024-05-31 ENCOUNTER — Encounter: Payer: Self-pay | Admitting: Physician Assistant

## 2024-05-31 ENCOUNTER — Other Ambulatory Visit: Payer: Self-pay | Admitting: Internal Medicine

## 2024-05-31 ENCOUNTER — Ambulatory Visit (INDEPENDENT_AMBULATORY_CARE_PROVIDER_SITE_OTHER): Admitting: Physician Assistant

## 2024-05-31 VITALS — BP 129/69 | HR 52 | Resp 20

## 2024-05-31 DIAGNOSIS — G309 Alzheimer's disease, unspecified: Secondary | ICD-10-CM

## 2024-05-31 DIAGNOSIS — F0280 Dementia in other diseases classified elsewhere without behavioral disturbance: Secondary | ICD-10-CM | POA: Diagnosis not present

## 2024-05-31 MED ORDER — DIVALPROEX SODIUM 125 MG PO CSDR: DELAYED_RELEASE_CAPSULE | ORAL | 3 refills | Status: DC

## 2024-05-31 NOTE — Progress Notes (Signed)
 Assessment/Plan:   Dementia likely due to Alzheimer's disease with behavioral disturbance  [name] is a very pleasant 78 y.o. RH male with a history of CAD status post CABG, osteoarthritis, hypertension, history of adenomatous polyps followed by GI, cataracts and history of dementia likely due to Alzheimer's disease with behavioral disturbance seen today in follow up for memory loss. Patient is currently on memantine  10 mg twice daily. Memory is worse. Unfortunately donepezil  cannot be added due to bradycardia. For mood he is on Depakote  250 mg nightly . Discussed increasing to twice a day if needed, wife is to try. He needs more assistance with ADLS than prior.     Follow up in 6  months. Continue memantine  10 mg twice daily, side effects discussed Continue Depakote  250 mg at night, may increase to twice daily if needed for hallucinations and mood changes Recommend good control of her cardiovascular risk factors Continue to control mood as per PCP Recommend counseling for wife as she may be experiencing caregiver distress syndrome Consider adult day programs     Subjective:    This patient is accompanied in the office by his wife who supplements the history.  Previous records as well as any outside records available were reviewed prior to todays visit. Patient was last seen on 01/20/2024    Any changes in memory since last visit? Worse.  He does not recognize his wife most of the time.  STM and LTM are affected.  He likes watching TV especially TCM but  does not follow the stories anymore, he does not want to listen to music either. He does not engage in conversations.  wife says. He needs more help with ADLs.  repeats oneself?  He is not as conversant as before unless he is a group (visual hallucination).  He only responds . Disoriented when walking into a room?  He does not recognize his house, he is lost, does not know where the rooms are.  Leaving objects?  May  misplace things frequently especially in the kitchen.   Wandering behavior?  He has wandering tendencies Any personality changes since last visit?  Our son comes every night to check on him and that calms him down.   Any worsening depression?:  There is more frustration than before . Since last visit, he may have violent outbursts.  Hallucinations or paranoia?  Before, he feels that there is someone else being at the house, different people, etc. Seizures? denies    Any sleep changes?  Denies vivid dreams, REM behavior or sleepwalking   Sleep apnea?   Denies.   Any hygiene concerns?  Uses to shower but is okay with sponge baths Independent of bathing and dressing?  Endorsed  Does the patient needs help with medications?  Wife is in charge   Who is in charge of the finances?  Wife is in charge     Any changes in appetite?  He forgets that he ate and he eats again    Patient have trouble swallowing? Denies.   Does the patient cook? No Any headaches?   denies   Any vision changes? Denies Chronic pain?  Sees chiropractor weekly due to chronic back pain Ambulates with difficulty?  Needs a walker for stability. Steps are shorter. He goes to the foot clinic due left lower extremity blisters, gets pedicure once a month   Recent falls or head injuries?  On 05/29/2024 the patient had presentation to the ED the year after he was chasing  his wife with a cane, becoming increasingly aggressive per her report, he then fell into the fireplace striking his head. NoLOC. Workup was negative, he was discharged on the same day. Unilateral weakness, numbness or tingling? denies   Any tremors?  Denies    Any anosmia?  Denies   Any incontinence of urine?  Endorsed, wears diapers   Any bowel dysfunction?  Constipation, he is on MiraLAX daily Patient lives with his wife      Does the patient drive? No longer drives     HISTORY 12/06/2020: His wife has noted memory problems since 2020 that has gradually  progressed.  He started having trouble calculating the checkbook and would be off by several hundreds of dollars.  He also quickly forgets conversations and would repeat questions after just minutes.  He hasn't driven for years but as a passenger he denies disorientation on familiar routes. On a recent cruise, he had trouble keeping up with the schedule and activities.  He has a system to remember to take his medications independently.  No difficulty with performing home chores or activities.  At first, it was thought to be related to his chronic back/radicular pain but now the memory problems appear to be out of proportion to being in pain.     He has family history of Alzheimer's disease on his mother's side, including his mother, aunts, uncles and grandmother. PREVIOUS MEDICATIONS:   CURRENT MEDICATIONS:  Outpatient Encounter Medications as of 05/31/2024  Medication Sig   acetaminophen  (TYLENOL ) 325 MG tablet Take 650 mg by mouth every 6 (six) hours as needed.   amLODipine  (NORVASC ) 5 MG tablet Take 1.5 tablets (7.5 mg total) by mouth daily.   cholecalciferol (VITAMIN D ) 1000 units tablet Take 1,000 Units by mouth daily.    Cyanocobalamin  (VITAMIN B12) 500 MCG TABS Take 500 mcg by mouth daily.    divalproex  (DEPAKOTE  SPRINKLES) 125 MG capsule Take 2 capsules twice a day   ezetimibe  (ZETIA ) 10 MG tablet TAKE 1 TABLET AT BEDTIME   fenofibrate  (TRICOR ) 145 MG tablet Take 1 tablet (145 mg total) by mouth at bedtime.   finasteride (PROSCAR) 5 MG tablet Take 5 mg by mouth daily.   furosemide  (LASIX ) 40 MG tablet Take 1 tablet (40 mg total) by mouth every other day.   LORazepam  (ATIVAN ) 0.5 MG tablet Take 0.5 tablets (0.25 mg total) by mouth 2 (two) times daily as needed for anxiety.   losartan  (COZAAR ) 50 MG tablet Take 1 tablet (50 mg total) by mouth daily.   memantine  (NAMENDA ) 10 MG tablet Take 1 tablet (10 mg total) by mouth 2 (two) times daily.   MOVANTIK  25 MG TABS tablet TAKE 1 TABLET(25 MG)  BY MOUTH DAILY   nitroGLYCERIN  (NITROSTAT ) 0.4 MG SL tablet Place 1 tablet (0.4 mg total) under the tongue every 5 (five) minutes as needed for chest pain (Up to 3 doses in one 24 hour period, if needing more, please go to Emergency Dept. to be evaluated).   oxyCODONE -acetaminophen  (PERCOCET) 7.5-325 MG tablet Take 0.5-1 tablets by mouth 2 (two) times daily as needed for severe pain (pain score 7-10).   ranolazine  (RANEXA ) 500 MG 12 hr tablet Take 1 tablet (500 mg total) by mouth 2 (two) times daily.   rosuvastatin  (CRESTOR ) 40 MG tablet TAKE 1 TABLET DAILY   tamsulosin (FLOMAX) 0.4 MG CAPS capsule Take 0.4 mg by mouth daily after supper.   traMADol  (ULTRAM ) 50 MG tablet Take by mouth every 6 (six) hours as  needed.   warfarin (COUMADIN ) 5 MG tablet TAKE AS DIRECTED BY COUMADIN  CLINIC   [DISCONTINUED] divalproex  (DEPAKOTE ) 125 MG DR tablet Take 1 tab at night for 1 week, then increase to 2 tablets at night May take it in the morning if needed   lidocaine  (XYLOCAINE ) 2 % solution Apply 15 mLs topically as needed.   [DISCONTINUED] MOVANTIK  25 MG TABS tablet TAKE 1 TABLET(25 MG) BY MOUTH DAILY   No facility-administered encounter medications on file as of 05/31/2024.       07/17/2022   12:00 PM 03/23/2018   11:49 AM  MMSE - Mini Mental State Exam  Not completed:  --  Orientation to time 0   Orientation to Place 4   Registration 3   Attention/ Calculation 0   Recall 2   Language- name 2 objects 2   Language- repeat 1   Language- follow 3 step command 3   Language- read & follow direction 1   Write a sentence 0   Copy design 0   Total score 16       10/11/2021    1:00 PM  Montreal Cognitive Assessment   Visuospatial/ Executive (0/5) 0  Naming (0/3) 2  Attention: Read list of digits (0/2) 2  Attention: Read list of letters (0/1) 1  Attention: Serial 7 subtraction starting at 100 (0/3) 2  Language: Repeat phrase (0/2) 0  Language : Fluency (0/1) 1  Abstraction (0/2) 2  Delayed  Recall (0/5) 2  Orientation (0/6) 5  Total 17  Adjusted Score (based on education) 17    Objective:     PHYSICAL EXAMINATION:    VITALS:   Vitals:   05/31/24 0927  BP: 129/69  Pulse: (!) 52  Resp: 20  SpO2: 97%    GEN:  The patient appears stated age and is in NAD. HEENT:  Normocephalic, atraumatic.   Neurological examination:  General: NAD, well-groomed, appears stated age. Orientation: The patient is alert.  Not oriented to person, place or date Cranial nerves: There is good facial symmetry.The speech is fluent and clear. No aphasia or dysarthria. Fund of knowledge is reduced. Recent and remote memory are impaired. Attention and concentration are reduced. Able to name objects and unable to repeat phrases.  Hearing is intact to conversational tone.   Sensation: Sensation is intact to light touch throughout Motor: Strength is at least antigravity x4. DTR's1/4 in UE/LE     Movement examination: Tone: There is normal tone in the UE/LE.  Abnormal movements:  no tremor.  No myoclonus.  No asterixis.   Coordination:  There is decremation with RAM's.  Abnormal finger to nose  Gait and Station: The patient has difficulty arising out of a deep-seated chair without the use of the hands.  He has profound kyphosis the patient's stride length is short, pace is slow.  Gait is cautious and wide based.    Thank you for allowing us  the opportunity to participate in the care of this nice patient. Please do not hesitate to contact us  for any questions or concerns.   Total time spent on today's visit was 30 minutes dedicated to this patient today, preparing to see patient, examining the patient, ordering tests and/or medications and counseling the patient, documenting clinical information in the EHR or other health record, independently interpreting results and communicating results to the patient/family, discussing treatment and goals, answering patient's questions and coordinating  care.  Cc:  [place], Betty G, MD  Camie Sevin 05/31/2024 11:05 AM

## 2024-05-31 NOTE — Patient Instructions (Signed)
 It was a pleasure to see you today at our office.   Recommendations:  Follow up in  6 months   Depakote 125 mg Take 1 tab at night , may increase  to 2 tabs at night, may increase  morning dose  if needed   Memantine 10 mg tablets  1 tablet twice daily. Consider appointment with our social worker   Whom to call:  Memory  decline, memory medications: Call our office [phone]   For psychiatric meds, mood meds: Please have your primary care physician manage these medications.      For assessment of decision of mental capacity and competency:  Call Dr. Erick Blinks, geriatric psychiatrist at [phone]  For guidance in geriatric dementia issues please call [facility] [phone]  For guidance regarding [facility] and if placement were needed at the facility, contact Sidney Ace, Social Worker tel: [phone]  If you have any severe symptoms of a stroke, or other severe issues such as confusion,severe chills or fever, etc call 911 or go to the ER as you may need to be evaluated further       RECOMMENDATIONS FOR ALL PATIENTS WITH MEMORY PROBLEMS: 1. Continue to exercise (Recommend 30 minutes of walking everyday, or 3 hours every week) 2. Increase social interactions - continue going to [place] and enjoy social gatherings with friends and family 3. Eat healthy, avoid fried foods and eat more fruits and vegetables 4. Maintain adequate blood pressure, blood sugar, and blood cholesterol level. Reducing the risk of stroke and cardiovascular disease also helps promoting better memory. 5. Avoid stressful situations. Live a simple life and avoid aggravations. Organize your time and prepare for the next day in anticipation. 6. Sleep well, avoid any interruptions of sleep and avoid any distractions in the bedroom that may interfere with adequate sleep quality 7. Avoid sugar, avoid sweets as there is a strong link between excessive sugar intake,  diabetes, and cognitive impairment We discussed the Mediterranean diet, which has been shown to help patients reduce the risk of progressive memory disorders and reduces cardiovascular risk. This includes eating fish, eat fruits and green leafy vegetables, nuts like almonds and hazelnuts, walnuts, and also use olive oil. Avoid fast foods and fried foods as much as possible. Avoid sweets and sugar as sugar use has been linked to worsening of memory function.  There is always a concern of gradual progression of memory problems. If this is the case, then we may need to adjust level of care according to patient needs. Support, both to the patient and caregiver, should then be put into place.    The Alzheimer's Association is here all day, every day for people facing Alzheimer's disease through our free 24/7 Helpline: [phone]. The Helpline provides reliable information and support to all those who need assistance, such as individuals living with memory loss, Alzheimer's or other dementia, caregivers, health care professionals and the public.  Our highly trained and knowledgeable staff can help you with: Understanding memory loss, dementia and Alzheimer's  Medications and other treatment options  General information about aging and brain health  Skills to provide quality care and to find the best care from professionals  Legal, financial and living-arrangement decisions Our Helpline also features: Confidential care consultation provided by master's level clinicians who can help with decision-making support, crisis assistance and education on issues families face every day  Help in a caller's preferred language using our translation service that features more than 200 languages and  dialects  Referrals to local community programs, services and ongoing support     FALL PRECAUTIONS: Be cautious when walking. Scan the area for obstacles that may increase the risk of trips and falls. When getting up in  the mornings, sit up at the edge of the bed for a few minutes before getting out of bed. Consider elevating the bed at the head end to avoid drop of blood pressure when getting up. Walk always in a well-lit room (use night lights in the walls). Avoid area rugs or power cords from appliances in the middle of the walkways. Use a walker or a cane if necessary and consider physical therapy for balance exercise. Get your eyesight checked regularly.  FINANCIAL OVERSIGHT: Supervision, especially oversight when making financial decisions or transactions is also recommended.  HOME SAFETY: Consider the safety of the kitchen when operating appliances like stoves, microwave oven, and blender. Consider having supervision and share cooking responsibilities until no longer able to participate in those. Accidents with firearms and other hazards in the house should be identified and addressed as well.   ABILITY TO BE LEFT ALONE: If patient is unable to contact 911 operator, consider using LifeLine, or when the need is there, arrange for someone to stay with patients. Smoking is a fire hazard, consider supervision or cessation. Risk of wandering should be assessed by caregiver and if detected at any point, supervision and safe proof recommendations should be instituted.  MEDICATION SUPERVISION: Inability to self-administer medication needs to be constantly addressed. Implement a mechanism to ensure safe administration of the medications.   DRIVING: Regarding driving, in patients with progressive memory problems, driving will be impaired. We advise to have someone else do the driving if trouble finding directions or if minor accidents are reported. Independent driving assessment is available to determine safety of driving.   If you are interested in the driving assessment, you can contact the following:  [facility] [employer] in [place] [phone]  [facility] [phone]  [facility] [phone] or [phone]      Mediterranean Diet A Mediterranean diet refers to food and lifestyle choices that are based on the traditions of countries located on the [employer]. This way of eating has been shown to help prevent certain conditions and improve outcomes for people who have chronic diseases, like kidney disease and heart disease. What are tips for following this plan? Lifestyle  Cook and eat meals together with your family, when possible. Drink enough fluid to keep your urine clear or pale yellow. Be physically active every day. This includes: Aerobic exercise like running or swimming. Leisure activities like gardening, walking, or housework. Get 7-8 hours of sleep each night. If recommended by your health care provider, drink red wine in moderation. This means 1 glass a day for nonpregnant women and 2 glasses a day for men. A glass of wine equals 5 oz (150 mL). Reading food labels  Check the serving size of packaged foods. For foods such as rice and pasta, the serving size refers to the amount of cooked product, not dry. Check the total fat in packaged foods. Avoid foods that have saturated fat or trans fats. Check the ingredients list for added sugars, such as corn syrup. Shopping  At the grocery store, buy most of your food from the areas near the walls of the store. This includes: Fresh fruits and vegetables (produce). Grains, beans, nuts, and seeds. Some of these may be available in  unpackaged forms or large amounts (in bulk). Fresh seafood. Poultry and eggs. Low-fat dairy products. Buy whole ingredients instead of prepackaged foods. Buy fresh fruits and vegetables in-season from local farmers markets. Buy frozen fruits and vegetables in resealable bags. If you do not have access to quality fresh seafood, buy precooked frozen shrimp or canned fish, such as tuna, salmon, or sardines. Buy small amounts of raw or  cooked vegetables, salads, or olives from the deli or salad bar at your store. Stock your pantry so you always have certain foods on hand, such as olive oil, canned tuna, canned tomatoes, rice, pasta, and beans. Cooking  Cook foods with extra-virgin olive oil instead of using butter or other vegetable oils. Have meat as a side dish, and have vegetables or grains as your main dish. This means having meat in small portions or adding small amounts of meat to foods like pasta or stew. Use beans or vegetables instead of meat in common dishes like chili or lasagna. Experiment with different cooking methods. Try roasting or broiling vegetables instead of steaming or sauteing them. Add frozen vegetables to soups, stews, pasta, or rice. Add nuts or seeds for added healthy fat at each meal. You can add these to yogurt, salads, or vegetable dishes. Marinate fish or vegetables using olive oil, lemon juice, garlic, and fresh herbs. Meal planning  Plan to eat 1 vegetarian meal one day each week. Try to work up to 2 vegetarian meals, if possible. Eat seafood 2 or more times a week. Have healthy snacks readily available, such as: Vegetable sticks with hummus. Greek yogurt. Fruit and nut trail mix. Eat balanced meals throughout the week. This includes: Fruit: 2-3 servings a day Vegetables: 4-5 servings a day Low-fat dairy: 2 servings a day Fish, poultry, or lean meat: 1 serving a day Beans and legumes: 2 or more servings a week Nuts and seeds: 1-2 servings a day Whole grains: 6-8 servings a day Extra-virgin olive oil: 3-4 servings a day Limit red meat and sweets to only a few servings a month What are my food choices? Mediterranean diet Recommended Grains: Whole-grain pasta. Brown rice. Bulgar wheat. Polenta. Couscous. Whole-wheat bread. Orpah Cobb. Vegetables: Artichokes. Beets. Broccoli. Cabbage. Carrots. Eggplant. Green beans. Chard. Kale. Spinach. Onions. Leeks. Peas. Squash. Tomatoes.  Peppers. Radishes. Fruits: Apples. Apricots. Avocado. Berries. Bananas. Cherries. Dates. Figs. Grapes. Lemons. Melon. Oranges. Peaches. Plums. Pomegranate. Meats and other protein foods: Beans. Almonds. Sunflower seeds. Pine nuts. Peanuts. Cod. Salmon. Scallops. Shrimp. Tuna. Tilapia. Clams. Oysters. Eggs. Dairy: Low-fat milk. Cheese. Greek yogurt. Beverages: Water. Red wine. Herbal tea. Fats and oils: Extra virgin olive oil. Avocado oil. Grape seed oil. Sweets and desserts: [place] yogurt with honey. Baked apples. Poached pears. Trail mix. Seasoning and other foods: Basil. Cilantro. Coriander. Cumin. Mint. Parsley. Sage. Rosemary. Tarragon. Garlic. Oregano. Thyme. Pepper. Balsalmic vinegar. Tahini. Hummus. Tomato sauce. Olives. Mushrooms. Limit these Grains: Prepackaged pasta or rice dishes. Prepackaged cereal with added sugar. Vegetables: Deep fried potatoes (french fries). Fruits: Fruit canned in syrup. Meats and other protein foods: Beef. Pork. Lamb. Poultry with skin. Hot dogs. Tomasa Blase. Dairy: Ice cream. Sour cream. Whole milk. Beverages: Juice. Sugar-sweetened soft drinks. Beer. Liquor and spirits. Fats and oils: Butter. Canola oil. Vegetable oil. Beef fat (tallow). Lard. Sweets and desserts: Cookies. Cakes. Pies. Candy. Seasoning and other foods: Mayonnaise. Premade sauces and marinades. The items listed may not be a complete list. Talk with your dietitian about what dietary choices are right for you. Summary The Mediterranean diet  includes both food and lifestyle choices. Eat a variety of fresh fruits and vegetables, beans, nuts, seeds, and whole grains. Limit the amount of red meat and sweets that you eat. Talk with your health care provider about whether it is safe for you to drink red wine in moderation. This means 1 glass a day for nonpregnant women and 2 glasses a day for men. A glass of wine equals 5 oz (150 mL). This information is not intended to replace advice given to you by  your health care provider. Make sure you discuss any questions you have with your health care provider. Document Released: 07/17/2016 Document Revised: 08/19/2016 Document Reviewed: 07/17/2016 Elsevier Interactive Patient Education  2017 [employer].

## 2024-06-01 ENCOUNTER — Telehealth: Payer: Self-pay

## 2024-06-01 NOTE — Transitions of Care (Post Inpatient/ED Visit) (Signed)
 06/01/2024  [name] MRN: [id number] DOB: 06-19-1946  Today's TOC FU Call Status: Today's TOC FU Call Status:: Successful TOC FU Call Completed TOC FU Call Complete Date: 06/01/24 Patient's Name and Date of Birth confirmed.  Transition Care Management Follow-up Telephone Call Date of Discharge: 05/29/24 Discharge Facility: [facility]) Type of Discharge: Emergency Department Reason for ED Visit: Other: How have you been since you were released from the hospital?: Better Any questions or concerns?: No  Items Reviewed: Did you receive and understand the discharge instructions provided?: Yes Medications obtained,verified, and reconciled?: Yes (Medications Reviewed) Any new allergies since your discharge?: No Dietary orders reviewed?: No Do you have support at home?: Yes People in Home [RPT]: spouse Name of Support/Comfort Primary Source: wife Timothy  Medications Reviewed Today: Medications Reviewed Today     Reviewed by [name], CMA (Certified Medical Assistant) on 06/01/24 at 1345  Med List Status: <None>   Medication Order Taking? Sig Documenting Provider Last Dose Status Informant  acetaminophen  (TYLENOL ) 325 MG tablet [id number] Yes Take 650 mg by mouth every 6 (six) hours as needed. [provider]  Active            Med Note SYDELL LOA DEL   Thu Apr 07, 2024 12:56 PM) Strength unknown  amLODipine  (NORVASC ) 5 MG tablet [id number] Yes Take 1.5 tablets (7.5 mg total) by mouth daily. Kate Lonni CROME, MD  Active   cholecalciferol (VITAMIN D ) 1000 units tablet [id number] Yes Take 1,000 Units by mouth daily.  [provider]  Active Spouse/Significant Other, Pharmacy Records  Cyanocobalamin  (VITAMIN B12) 500 MCG TABS [id number] Yes Take 500 mcg by mouth daily.  [provider]  Active Spouse/Significant Other, Pharmacy Records  divalproex  (DEPAKOTE  SPRINKLES) 125 MG capsule [id number] Yes Take 2 capsules twice a day Wertman,  Sara E, PA-C  Active   ezetimibe  (ZETIA ) 10 MG tablet [id number] Yes TAKE 1 TABLET AT BEDTIME Varanasi, Jayadeep S, MD  Active Spouse/Significant Other, Pharmacy Records  fenofibrate  (TRICOR ) 145 MG tablet [id number] Yes Take 1 tablet (145 mg total) by mouth at bedtime. Kate Lonni CROME, MD  Active   finasteride (PROSCAR) 5 MG tablet [id number] Yes Take 5 mg by mouth daily. [provider]  Active Spouse/Significant Other, Pharmacy Records  furosemide  (LASIX ) 40 MG tablet [id number]  Take 1 tablet (40 mg total) by mouth every other day.  Patient not taking: Reported on 06/01/2024   [name], [place]  Expired 05/31/24 2359   lidocaine  (XYLOCAINE ) 2 % solution [id number]  Apply 15 mLs topically as needed. [provider]  Active   LORazepam  (ATIVAN ) 0.5 MG tablet [id number] Yes Take 0.5 tablets (0.25 mg total) by mouth 2 (two) times daily as needed for anxiety. [place], Betty G, MD  Active   losartan  (COZAAR ) 50 MG tablet [id number] Yes Take 1 tablet (50 mg total) by mouth daily. [name], PA  Active   memantine  (NAMENDA ) 10 MG tablet [id number] Yes Take 1 tablet (10 mg total) by mouth 2 (two) times daily. Wertman, Sara E, PA-C  Active   MOVANTIK  25 MG TABS tablet [id number] Yes TAKE 1 TABLET(25 MG) BY MOUTH DAILY Pyrtle, Gordy HERO, MD  Active   nitroGLYCERIN  (NITROSTAT ) 0.4 MG SL tablet [id number] Yes Place 1 tablet (0.4 mg total) under the tongue every 5 (five) minutes as needed for chest pain (Up to 3 doses in one 24 hour period, if needing more, please go to Emergency Dept. to be evaluated).  Kate Lonni CROME, MD  Active   oxyCODONE -acetaminophen  (PERCOCET) 7.5-325 MG tablet [id number] Yes Take 0.5-1 tablets by mouth 2 (two) times daily as needed for severe pain (pain score 7-10). [place], Betty G, MD  Active   ranolazine  (RANEXA ) 500 MG 12 hr tablet [id number] Yes Take 1 tablet (500 mg total) by mouth 2 (two) times daily. [name], PA  Active   rosuvastatin   (CRESTOR ) 40 MG tablet [id number] Yes TAKE 1 TABLET DAILY Varanasi, Jayadeep S, MD  Active Spouse/Significant Other, Pharmacy Records  tamsulosin (FLOMAX) 0.4 MG CAPS capsule [id number] Yes Take 0.4 mg by mouth daily after supper. [provider]  Active Spouse/Significant Other, Pharmacy Records  traMADol  (ULTRAM ) 50 MG tablet [id number] Yes Take by mouth every 6 (six) hours as needed. [provider]  Active            Med Note SYDELL LOA DEL   Thu Apr 07, 2024 12:56 PM) Dose unknown  warfarin (COUMADIN ) 5 MG tablet [id number] Yes TAKE AS DIRECTED BY COUMADIN  CLINIC Dann Candyce RAMAN, MD  Active Spouse/Significant Other, Pharmacy Records           Med Note NADINE, [place] H   Tue Jul 14, 2023  2:05 PM)              Home Care and Equipment/Supplies: Were Home Health Services Ordered?: No Any new equipment or medical supplies ordered?: No  Functional Questionnaire: Do you need assistance with bathing/showering or dressing?: Yes Do you need assistance with meal preparation?: No Do you need assistance with eating?: No Do you have difficulty maintaining continence: Yes Do you need assistance with getting out of bed/getting out of a chair/moving?: Yes Do you have difficulty managing or taking your medications?: No  Follow up appointments reviewed: PCP Follow-up appointment confirmed?: Yes Date of PCP follow-up appointment?: 06/13/24 Follow-up Provider: [facility] [place] [facility] Follow-up appointment confirmed?: NA Do you need transportation to your follow-up appointment?: No Do you understand care options if your condition(s) worsen?: Yes-patient verbalized understanding    SIGNATURE [name] , cma

## 2024-06-13 ENCOUNTER — Ambulatory Visit (INDEPENDENT_AMBULATORY_CARE_PROVIDER_SITE_OTHER): Admitting: Family Medicine

## 2024-06-13 ENCOUNTER — Encounter: Payer: Self-pay | Admitting: Family Medicine

## 2024-06-13 VITALS — BP 110/70 | HR 60 | Resp 16 | Ht 72.0 in

## 2024-06-13 DIAGNOSIS — I10 Essential (primary) hypertension: Secondary | ICD-10-CM | POA: Diagnosis not present

## 2024-06-13 DIAGNOSIS — E782 Mixed hyperlipidemia: Secondary | ICD-10-CM | POA: Diagnosis not present

## 2024-06-13 DIAGNOSIS — G301 Alzheimer's disease with late onset: Secondary | ICD-10-CM | POA: Diagnosis not present

## 2024-06-13 DIAGNOSIS — F02818 Dementia in other diseases classified elsewhere, unspecified severity, with other behavioral disturbance: Secondary | ICD-10-CM | POA: Diagnosis not present

## 2024-06-13 DIAGNOSIS — D6859 Other primary thrombophilia: Secondary | ICD-10-CM

## 2024-06-13 DIAGNOSIS — R296 Repeated falls: Secondary | ICD-10-CM | POA: Diagnosis not present

## 2024-06-13 MED ORDER — AMLODIPINE BESYLATE 5 MG PO TABS: 5.0000 mg | ORAL_TABLET | Freq: Every day | ORAL | Status: DC

## 2024-06-13 NOTE — Progress Notes (Signed)
 ACUTE VISIT Chief Complaint  Patient presents with   Hospitalization Follow-up   HPI: [name] is a 78 y.o. male, with a PMHx significant for CAD, chronic anticoagulation, HTN, Alzheimer's disease, HLD, and chronic pain, who is here today with his wife for a hospital follow-up regarding Fall.  Seen in [facility] ED on 05/29/2024 following a fall, according to ED note, he was reportedly chasing his wife, fell and struck his head on the fireplace.   BPs prior to arrival 90s/60s, reportedly normal for him, and BP in ED was 110/68. EKG normal. Labs significant for baseline anemia, baseline leukopenia, slightly elevated creatinine, slight hypercalcemia w/ otherwise normal electrolytes, and therapeutic INR.  CT Abdomen/Pelvis w/ findings of:  1. No acute intrathoracic, intra-abdominal, intrapelvic traumatic injury with limited evaluation on this noncontrast study.  2. No acute fracture or traumatic malalignment of the thoracic or lumbar spine.  3. The cervical spine is better evaluated on this study compared to CT C-spine. Multilevel degenerative changes of the spine leading to multilevel osseous neural foraminal stenosis. No severe osseous central canal stenosis. No acute cervical spine fracture identified.   Wife says that since being seen in the ED he has fallen twice, with his most recent occurring yesterday but she did not witness his fall that time. No evidence of serious injury and mobility is at baseline. She says that at home he does have a walker at home to use.  He has not c/o headaches, CP, or SOB.  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  Chronic Pain: Currently on Tramadol  50 mg bid as needed.   Alzheimer's Disease/Dementia: Followed by Neurology, most recently seen on 05/31/2024 by Camie Sevin, PA-C. Managed with Namenda  10 mg twice daily and Depakote  adjusted , currently on 250 mg twice  Daily. He  is also on Lorazepam  0.25 mg twice daily as needed for extreme agitation associated with his dementia. According to his wife, he take this me very infrequently. Not longer pursuing NH placement, his wife has decided to keep him at home for now.  Hypertension:  Medications: Amlodipine  5 mg daily, Losartan  50 mg daily, and Lasix  40 mg every other day.  BP readings at home: 120/80s  Positive for LE edema, stable, he wears compression stockings/socks for. Venous ulcers have healed.  Sees Coumadin  Clinic on 7/11 and Cardiologist, Dr Kate on 8/06  BP Readings from Last 3 Encounters:  06/13/24 110/70  05/31/24 129/69  05/29/24 134/66   Lab Results  Component Value Date   CREATININE 1.90 (H) 05/29/2024   BUN 33 (H) 05/29/2024   NA 143 05/29/2024   K 3.9 05/29/2024   CL 104 05/29/2024   CO2 29 05/29/2024   Hyperlipidemia//CAD: Currently on Rosuvastatin  40 mg daily, Tricor  145 mg at bedtime, and Zetia  10 mg at bedtime for HLD.  Hypercoagulable state on Coumadin  5 mg.  Lab Results  Component Value Date   CHOL 104 02/26/2024   HDL 48 02/26/2024   LDLCALC 46 02/26/2024   TRIG 36 02/26/2024   CHOLHDL 2.2 02/26/2024   Review of Systems  Constitutional:  Negative for activity change, appetite change and fever.  HENT:  Negative for sore throat.   Respiratory:  Negative for cough and wheezing.   Gastrointestinal:  Negative for abdominal pain.  Genitourinary:  Negative for decreased urine volume, dysuria and hematuria.  Musculoskeletal:  Positive for arthralgias, gait problem, joint swelling and neck pain.       (+)  Falls x2 since 6/22   Skin:  Negative for rash.  Neurological:  Negative for syncope.  Psychiatric/Behavioral:  Positive for hallucinations.   See other pertinent positives and negatives in HPI.  Current Outpatient Medications on File Prior to Visit  Medication Sig Dispense Refill   acetaminophen  (TYLENOL ) 325 MG tablet Take 650 mg by mouth every 6 (six) hours as  needed.     cholecalciferol (VITAMIN D ) 1000 units tablet Take 1,000 Units by mouth daily.      Cyanocobalamin  (VITAMIN B12) 500 MCG TABS Take 500 mcg by mouth daily.      divalproex  (DEPAKOTE  SPRINKLES) 125 MG capsule Take 2 capsules twice a day 360 capsule 3   ezetimibe  (ZETIA ) 10 MG tablet TAKE 1 TABLET AT BEDTIME 90 tablet 3   finasteride (PROSCAR) 5 MG tablet Take 5 mg by mouth daily.     furosemide  (LASIX ) 40 MG tablet Take 1 tablet (40 mg total) by mouth every other day. 90 tablet 3   LORazepam  (ATIVAN ) 0.5 MG tablet Take 0.5 tablets (0.25 mg total) by mouth 2 (two) times daily as needed for anxiety. 30 tablet 0   losartan  (COZAAR ) 50 MG tablet Take 1 tablet (50 mg total) by mouth daily. 90 tablet 2   memantine  (NAMENDA ) 10 MG tablet Take 1 tablet (10 mg total) by mouth 2 (two) times daily. 180 tablet 3   MOVANTIK  25 MG TABS tablet TAKE 1 TABLET(25 MG) BY MOUTH DAILY 30 tablet 1   nitroGLYCERIN  (NITROSTAT ) 0.4 MG SL tablet Place 1 tablet (0.4 mg total) under the tongue every 5 (five) minutes as needed for chest pain (Up to 3 doses in one 24 hour period, if needing more, please go to Emergency Dept. to be evaluated). 25 tablet 11   oxyCODONE -acetaminophen  (PERCOCET) 7.5-325 MG tablet Take 0.5-1 tablets by mouth 2 (two) times daily as needed for severe pain (pain score 7-10). 30 tablet 0   ranolazine  (RANEXA ) 500 MG 12 hr tablet Take 1 tablet (500 mg total) by mouth 2 (two) times daily. 60 tablet 0   rosuvastatin  (CRESTOR ) 40 MG tablet TAKE 1 TABLET DAILY 90 tablet 3   tamsulosin (FLOMAX) 0.4 MG CAPS capsule Take 0.4 mg by mouth daily after supper.     traMADol  (ULTRAM ) 50 MG tablet Take by mouth every 6 (six) hours as needed.     warfarin (COUMADIN ) 5 MG tablet TAKE AS DIRECTED BY COUMADIN  CLINIC 80 tablet 3   No current facility-administered medications on file prior to visit.    Past Medical History:  Diagnosis Date   Allergy    Anemia    Anxiety    Arthritis    CAD (coronary  artery disease) 1996   POST CABG   Cataracts, bilateral    Chicken pox    Dyslipidemia    Hyperlipidemia    Hypertension    Sinus bradycardia    Skin cancer 2018   scalp   Allergies  Allergen Reactions   Other     Environmental allergies   Imdur  [Isosorbide  Nitrate] Other (See Comments)    headache   Latex     Itchy hands    Social History   Socioeconomic History   Marital status: Married    Spouse name: [name]   Number of children: 2   Years of education: COLLEGE   Highest education level: Bachelor's degree (e.g., BA, AB, BS)  Occupational History   Occupation: RETIRED  Tobacco Use   Smoking status: Former  Current packs/day: 0.00    Types: Cigarettes, Pipe    Quit date: 05/29/1982    Years since quitting: 42.0   Smokeless tobacco: Never  Vaping Use   Vaping status: Never Used  Substance and Sexual Activity   Alcohol use: Not Currently    Comment: 0-2 drinks a night   Drug use: Never   Sexual activity: Not Currently    Partners: Female    Comment: married  Other Topics Concern   Not on file  Social History Narrative   Right handed   Caffeine   One story home lives with wife   retired   Chief Executive Officer Drivers of Corporate investment banker Strain: Low Risk  (01/08/2024)   Overall Financial Resource Strain (CARDIA)    Difficulty of Paying Living Expenses: Not hard at all  Food Insecurity: No Food Insecurity (01/08/2024)   Hunger Vital Sign    Worried About Running Out of Food in the Last Year: Never true    Ran Out of Food in the Last Year: Never true  Transportation Needs: No Transportation Needs (01/08/2024)   PRAPARE - Administrator, Civil Service (Medical): No    Lack of Transportation (Non-Medical): No  Physical Activity: Inactive (01/08/2024)   Exercise Vital Sign    Days of Exercise per Week: 0 days    Minutes of Exercise per Session: 0 min  Stress: No Stress Concern Present (01/08/2024)   [employer] of Occupational Health -  Occupational Stress Questionnaire    Feeling of Stress : Not at all  Recent Concern: Stress - Stress Concern Present (12/19/2023)   [employer] of Occupational Health - Occupational Stress Questionnaire    Feeling of Stress : To some extent  Social Connections: Moderately Isolated (01/08/2024)   Social Connection and Isolation Panel    Frequency of Communication with Friends and Family: More than three times a week    Frequency of Social Gatherings with Friends and Family: Once a week    Attends Religious Services: Never    Database administrator or Organizations: No    Attends Banker Meetings: Never    Marital Status: Married   Vitals:   06/13/24 1100  BP: 110/70  Pulse: 60  Resp: 16  SpO2: 98%   Body mass index is 23.32 kg/m.  Physical Exam Vitals and nursing note reviewed.  Constitutional:      General: He is not in acute distress.    Appearance: He is well-developed.  HENT:     Head: Normocephalic and atraumatic.  Eyes:     Conjunctiva/sclera: Conjunctivae normal.  Cardiovascular:     Rate and Rhythm: Normal rate and regular rhythm.     Heart sounds: No murmur heard.    Comments: Lymphedema LE's. Pulmonary:     Effort: Pulmonary effort is normal. No respiratory distress.     Breath sounds: Normal breath sounds.  Abdominal:     Palpations: Abdomen is soft.     Tenderness: There is no abdominal tenderness.  Musculoskeletal:     Cervical back: Decreased range of motion.     Thoracic back: Deformity (Kyphosis) present.     Right lower leg: Edema present.     Left lower leg: Edema present.  Skin:    General: Skin is warm.     Findings: No erythema or rash.  Neurological:     Mental Status: He is alert. Mental status is at baseline.     Comments: In a wheel  chair.  Psychiatric:        Mood and Affect: Mood and affect normal.   ASSESSMENT AND PLAN: Mr.Won J Olivo who was seen today for a hospital follow-up regarding Fall.  Frequent  falls Assessment & Plan: Several risk factors for falls. We discussed fall precautions,difficult given his hx of OA,unstable balance,and alzheimer's with behavioral disturbances.   Essential hypertension Assessment & Plan: BP is adequately controlled. Continue losartan  100 mg daily and Amlodipine  5 mg daily. Continue monitoring BP at home. Adequate hydration encouraged.  Orders: -     amLODIPine  Besylate; Take 1 tablet (5 mg total) by mouth daily.  Primary hypercoagulable state [facility]) [D68.59] Assessment & Plan: Currently on Coumadin , INR at goal. We discussed some side effects of Coumadin . If frequent falls continue may need to consider stopping medication. Follows with cardiologist.   Mixed hyperlipidemia Assessment & Plan: Last LDL 46 in 02/2024. Currently on Tricor , Rosuvastatin ,and Zetia . He and his wife agrees with stopping Tricor . Continue Rosuvastatin  40 mg daily and Zetia  10 mg daily.   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 Depakote  recently adjusted. On Namenda  10 mg bid. Continue Alprazolam 0.25 mg mg 1/2-1 tab daily as needed for agitation. Follows with neurologist.    I spent a total of 37 minutes in both face to face and non face to face activities for this visit on the date of this encounter. During this time history was obtained and documented, examination was performed, prior labs/imaging reviewed, and assessment/plan discussed.  Return in about 5 months (around 11/13/2024) for chronic problems. I,Emily Lagle,acting as a Neurosurgeon for Manhattan Mccuen [place], MD.,have documented all relevant documentation on the behalf of Cherica Heiden [place], MD,as directed by  Lennette Fader [place], MD while in the presence of Tonimarie Gritz [place], MD.  I, Catalea Labrecque [place], MD, have reviewed all documentation for this visit. The documentation on 06/16/24 for the exam, diagnosis, procedures, and orders are all accurate and complete.  Cornelius Marullo G.  [place], MD  [facility]. [place] office.

## 2024-06-13 NOTE — Patient Instructions (Signed)
 A few things to remember from today's visit:  Frequent falls  Essential hypertension - Plan: amLODipine  (NORVASC ) 5 MG tablet  Primary hypercoagulable state (HCC) [D68.59]  Mixed hyperlipidemia Today we stopped Fenofibrate . Coumadin  is a concern due to frequent falls. No changes in rest of meds. Continue Tramadol .  If you need refills for medications you take chronically, please call your pharmacy. Do not use My Chart to request refills or for acute issues that need immediate attention. If you send a my chart message, it may take a few days to be addressed, specially if I am not in the office.  Please be sure medication list is accurate. If a new problem present, please set up appointment sooner than planned today.

## 2024-06-16 NOTE — Assessment & Plan Note (Addendum)
 Several risk factors for falls. We discussed fall precautions,difficult given his hx of OA,unstable balance,and alzheimer's with behavioral disturbances.

## 2024-06-16 NOTE — Assessment & Plan Note (Signed)
 Gradually getting worse. Depakote  recently adjusted. On Namenda  10 mg bid. Continue Alprazolam 0.25 mg mg 1/2-1 tab daily as needed for agitation. Follows with neurologist.

## 2024-06-16 NOTE — Assessment & Plan Note (Signed)
 Last LDL 46 in 02/2024. Currently on Tricor , Rosuvastatin ,and Zetia . He and his wife agrees with stopping Tricor . Continue Rosuvastatin  40 mg daily and Zetia  10 mg daily.

## 2024-06-16 NOTE — Assessment & Plan Note (Signed)
 BP is adequately controlled. Continue losartan  100 mg daily and Amlodipine  5 mg daily. Continue monitoring BP at home. Adequate hydration encouraged.

## 2024-06-16 NOTE — Assessment & Plan Note (Signed)
 Currently on Coumadin , INR at goal. We discussed some side effects of Coumadin . If frequent falls continue may need to consider stopping medication. Follows with cardiologist.

## 2024-06-17 ENCOUNTER — Ambulatory Visit: Attending: Interventional Cardiology

## 2024-06-17 DIAGNOSIS — D6859 Other primary thrombophilia: Secondary | ICD-10-CM | POA: Diagnosis not present

## 2024-06-17 DIAGNOSIS — Z5181 Encounter for therapeutic drug level monitoring: Secondary | ICD-10-CM | POA: Insufficient documentation

## 2024-06-17 LAB — POCT INR: INR: 2.1 (ref 2.0–3.0)

## 2024-06-17 NOTE — Patient Instructions (Signed)
 Description   Continue taking warfarin 1/2 tablet daily EXCEPT 1 tablet on Thursdays.  Stay consistent with greens and ensure each week (Ensure:1 time per day) Recheck INR in 6 weeks.  Coumadin  Clinic [phone]

## 2024-06-17 NOTE — Progress Notes (Signed)
Please see anticoagulation encounter.

## 2024-06-20 ENCOUNTER — Other Ambulatory Visit: Payer: Self-pay

## 2024-06-20 ENCOUNTER — Telehealth: Payer: Self-pay | Admitting: Cardiology

## 2024-06-20 MED ORDER — WARFARIN SODIUM 5 MG PO TABS
ORAL_TABLET | ORAL | 0 refills | Status: DC
Start: 2024-06-20 — End: 2024-06-23

## 2024-06-20 NOTE — Telephone Encounter (Signed)
*  STAT* If patient is at the pharmacy, call can be transferred to refill team.   1. Which medications need to be refilled? (please list name of each medication and dose if known) new prescription for Wararin to his local pharmacy, util his mailorder gets herea   2. Would you like to learn more about the convenience, safety, & potential cost savings by using the [facility]?    3. Are you open to using the [facility] (Type [facility].   4. Which pharmacy/location (including street and city if local pharmacy) is medication to be sent to?[facility]  [address] [place]   5. Do they need a 30 day or 90 day supply? # 14- please call today- out of medicine

## 2024-06-20 NOTE — Telephone Encounter (Signed)
 Refill request for warfarin:  Last INR was 2.1 on 06/17/24 Next INR due 07/29/24 LOV was 02/26/24  Refill approved.

## 2024-06-21 ENCOUNTER — Other Ambulatory Visit: Payer: Self-pay | Admitting: Family Medicine

## 2024-06-21 DIAGNOSIS — G301 Alzheimer's disease with late onset: Secondary | ICD-10-CM

## 2024-06-21 DIAGNOSIS — R258 Other abnormal involuntary movements: Secondary | ICD-10-CM

## 2024-06-23 ENCOUNTER — Other Ambulatory Visit: Payer: Self-pay | Admitting: Cardiology

## 2024-06-23 DIAGNOSIS — R3914 Feeling of incomplete bladder emptying: Secondary | ICD-10-CM | POA: Diagnosis not present

## 2024-06-23 DIAGNOSIS — R3911 Hesitancy of micturition: Secondary | ICD-10-CM | POA: Diagnosis not present

## 2024-06-23 DIAGNOSIS — D6859 Other primary thrombophilia: Secondary | ICD-10-CM

## 2024-06-23 DIAGNOSIS — N401 Enlarged prostate with lower urinary tract symptoms: Secondary | ICD-10-CM | POA: Diagnosis not present

## 2024-06-23 NOTE — Telephone Encounter (Signed)
 Prescription refill request received for warfarin Lov: 02/26/24 [name])  Next INR check: 07/29/24 Warfarin tablet strength: 5mg   Appropriate dose. Refill sent.

## 2024-06-27 ENCOUNTER — Other Ambulatory Visit: Payer: Self-pay

## 2024-06-27 DIAGNOSIS — D6859 Other primary thrombophilia: Secondary | ICD-10-CM

## 2024-06-27 MED ORDER — WARFARIN SODIUM 5 MG PO TABS: ORAL_TABLET | ORAL | 0 refills | Status: DC

## 2024-06-30 ENCOUNTER — Other Ambulatory Visit: Payer: Self-pay | Admitting: Interventional Cardiology

## 2024-07-01 ENCOUNTER — Other Ambulatory Visit: Payer: Self-pay | Admitting: Interventional Cardiology

## 2024-07-11 ENCOUNTER — Other Ambulatory Visit: Payer: Self-pay

## 2024-07-11 NOTE — Telephone Encounter (Unsigned)
 Copied from CRM [id number]. Topic: Clinical - Prescription Issue >> Jul 11, 2024  2:29 PM Deleta RAMAN wrote: Reason for CRM: patient would like to transfer tramadol  50 mg to [facility] on [address] listed as preferred pharmacy. Would like for the [facility]. Please call the patient for any concerns at [phone]

## 2024-07-12 MED ORDER — TRAMADOL HCL 50 MG PO TABS: 50.0000 mg | ORAL_TABLET | Freq: Two times a day (BID) | ORAL | 2 refills | Status: DC | PRN

## 2024-07-12 NOTE — Progress Notes (Unsigned)
 Cardiology Office Note:    Date:  07/13/2024   ID:  [name], [name] 11/25/1946, MRN [id number]  PCP:  [place], Betty G, MD  Cardiologist:  Lonni LITTIE Nanas, MD  Electrophysiologist:  None   Referring MD: [place], Betty G, MD   No chief complaint on file.   History of Present Illness:    [name] is a 78 y.o. male with a hx of CAD status post CABG in 1996, hypertension, hyperlipidemia who presents for follow-up.  Previously followed with Dr. Dann.  He had 2 ED visits for chest pain in August 2024.  Stress test or heart catheterization was discussed but he stated he was unable to lay flat on table and declined any imaging or procedures.  He was started on Imdur  30 mg daily and Ranexa  500 mg twice daily.  Reports discontinued Imdur  due to headaches.  States that chest pain has improved, denies any recent chest pain.  Denies any dyspnea, lightheadedness, syncope,or palpitations.  Has chronic swelling in legs which reports from lymphedema.  Has blister in legs and is following in wound clinic.  Denies any bleeding on warfarin.  Echocardiogram 03/2024 showed EF 60 to 65%, normal RV function, moderate left atrial enlargement, no significant valvular disease, dilated aortic root measuring 44 mm.  Zio patch x 7 days 02/2024 showed no significant arrhythmias, low resting heart rate (average 58 bpm)  Since last clinic visit, he reports he has been having chest pain.  Sometimes on left side of chest but sometimes right.  Lasts about a minute.  Describes both sharp pain and burning.  No clear cause.   Wt Readings from Last 3 Encounters:  07/13/24 164 lb (74.4 kg)  05/29/24 171 lb 15.3 oz (78 kg)  04/07/24 151 lb 6.4 oz (68.7 kg)     Past Medical History:  Diagnosis Date   Allergy    Anemia    Anxiety    Arthritis    CAD (coronary artery disease) 1996   POST CABG   Cataracts, bilateral    Chicken pox    Dyslipidemia    Hyperlipidemia    Hypertension    Sinus  bradycardia    Skin cancer 2018   scalp    Past Surgical History:  Procedure Laterality Date   BYPASS GRAFT  1996   CATARACT EXTRACTION Right    COLONOSCOPY WITH PROPOFOL  N/A 11/20/2022   Procedure: COLONOSCOPY WITH PROPOFOL ;  Surgeon: Albertus Gordy HERO, MD;  Location: [facility] ENDOSCOPY;  Service: Gastroenterology;  Laterality: N/A;   CORONARY ARTERY BYPASS GRAFT  6.1996   CABG x3 (SVGs, IMA)   EYE SURGERY  12.2017   HERNIA REPAIR  9.2003   Umbilical.   INGUINAL HERNIA REPAIR Right 04/09/2020   Procedure: LAPAROSCOPIC RIGHT INGUINAL HERNIA REPAIR WITH MESH;  Surgeon: Rubin Calamity, MD;  Location: [facility] OR;  Service: General;  Laterality: Right;   POLYPECTOMY  11/20/2022   Procedure: POLYPECTOMY;  Surgeon: Albertus Gordy HERO, MD;  Location: [facility] ENDOSCOPY;  Service: Gastroenterology;;   REFRACTIVE SURGERY Right    SKIN BIOPSY     head x 2   UMBILICAL HERNIA REPAIR  2003    Current Medications: Current Meds  Medication Sig   acetaminophen  (TYLENOL ) 325 MG tablet Take 650 mg by mouth every 6 (six) hours as needed.   amLODipine  (NORVASC ) 5 MG tablet Take 1 tablet (5 mg total) by mouth daily.   cholecalciferol (VITAMIN D ) 1000 units tablet Take 1,000 Units by mouth daily.  Cyanocobalamin  (VITAMIN B12) 500 MCG TABS Take 500 mcg by mouth daily.    divalproex  (DEPAKOTE  SPRINKLES) 125 MG capsule Take 2 capsules twice a day   ezetimibe  (ZETIA ) 10 MG tablet TAKE 1 TABLET AT BEDTIME   finasteride (PROSCAR) 5 MG tablet Take 5 mg by mouth daily.   LORazepam  (ATIVAN ) 0.5 MG tablet TAKE 1/2 TABLET(0.25 MG) BY MOUTH TWICE DAILY AS NEEDED FOR ANXIETY   memantine  (NAMENDA ) 10 MG tablet Take 1 tablet (10 mg total) by mouth 2 (two) times daily.   MOVANTIK  25 MG TABS tablet TAKE 1 TABLET(25 MG) BY MOUTH DAILY   nitroGLYCERIN  (NITROSTAT ) 0.4 MG SL tablet Place 1 tablet (0.4 mg total) under the tongue every 5 (five) minutes as needed for chest pain (Up to 3 doses in one 24 hour period, if needing more, please go to  Emergency Dept. to be evaluated).   rosuvastatin  (CRESTOR ) 40 MG tablet TAKE 1 TABLET DAILY   tamsulosin (FLOMAX) 0.4 MG CAPS capsule Take 0.4 mg by mouth daily after supper.   traMADol  (ULTRAM ) 50 MG tablet Take 1 tablet (50 mg total) by mouth every 12 (twelve) hours as needed.   warfarin (COUMADIN ) 5 MG tablet TAKE 1/2 TABLET TO 1 TABLET BY MOUTH DAILY AS DIRECTED BY COUMADIN  CLINIC   [DISCONTINUED] furosemide  (LASIX ) 40 MG tablet Take 1 tablet (40 mg total) by mouth every other day.   [DISCONTINUED] ranolazine  (RANEXA ) 500 MG 12 hr tablet Take 1 tablet (500 mg total) by mouth 2 (two) times daily.     Allergies:   Other, Imdur  [isosorbide  nitrate], and Latex   Social History   Socioeconomic History   Marital status: Married    Spouse name: [name]   Number of children: 2   Years of education: COLLEGE   Highest education level: Bachelor's degree (e.g., BA, AB, BS)  Occupational History   Occupation: RETIRED  Tobacco Use   Smoking status: Former    Current packs/day: 0.00    Types: Cigarettes, Pipe    Quit date: 05/29/1982    Years since quitting: 42.1   Smokeless tobacco: Never  Vaping Use   Vaping status: Never Used  Substance and Sexual Activity   Alcohol use: Not Currently    Comment: 0-2 drinks a night   Drug use: Never   Sexual activity: Not Currently    Partners: Female    Comment: married  Other Topics Concern   Not on file  Social History Narrative   Right handed   Caffeine   One story home lives with wife   retired   Chief Executive Officer Drivers of Corporate investment banker Strain: Low Risk  (01/08/2024)   Overall Financial Resource Strain (CARDIA)    Difficulty of Paying Living Expenses: Not hard at all  Food Insecurity: No Food Insecurity (01/08/2024)   Hunger Vital Sign    Worried About Running Out of Food in the Last Year: Never true    Ran Out of Food in the Last Year: Never true  Transportation Needs: No Transportation Needs (01/08/2024)   PRAPARE - Therapist, art (Medical): No    Lack of Transportation (Non-Medical): No  Physical Activity: Inactive (01/08/2024)   Exercise Vital Sign    Days of Exercise per Week: 0 days    Minutes of Exercise per Session: 0 min  Stress: No Stress Concern Present (01/08/2024)   [employer] of Occupational Health - Occupational Stress Questionnaire    Feeling of Stress : Not  at all  Recent Concern: Stress - Stress Concern Present (12/19/2023)   [employer] of Occupational Health - Occupational Stress Questionnaire    Feeling of Stress : To some extent  Social Connections: Moderately Isolated (01/08/2024)   Social Connection and Isolation Panel    Frequency of Communication with Friends and Family: More than three times a week    Frequency of Social Gatherings with Friends and Family: Once a week    Attends Religious Services: Never    Database administrator or Organizations: No    Attends Engineer, structural: Never    Marital Status: Married     Family History: The patient's family history includes AAA (abdominal aortic aneurysm) in his sister; Alzheimer's disease in his mother; Cancer in his father; Colon cancer in an other family member; Hypertension in his maternal grandmother and paternal grandmother; Prostate cancer in his father. There is no history of Esophageal cancer, Pancreatic cancer, Liver disease, or Stomach cancer.  ROS:   Please see the history of present illness.     All other systems reviewed and are negative.  EKGs/Labs/Other Studies Reviewed:    The following studies were reviewed today:   EKG:  EKG is not ordered today.    Recent Labs: 02/26/2024: BNP 190.8 05/29/2024: ALT 35; BUN 33; Creatinine, Ser 1.90; Hemoglobin 10.2; Magnesium 2.0; Platelets 169; Potassium 3.9; Sodium 143  Recent Lipid Panel    Component Value Date/Time   CHOL 104 02/26/2024 1459   TRIG 36 02/26/2024 1459   HDL 48 02/26/2024 1459   CHOLHDL 2.2 02/26/2024 1459    CHOLHDL 2 11/05/2021 0845   VLDL 9.4 11/05/2021 0845   LDLCALC 46 02/26/2024 1459    Physical Exam:    VS:  BP 132/69   Pulse (!) 51   Ht 6' (1.829 m)   Wt 164 lb (74.4 kg)   SpO2 100%   BMI 22.24 kg/m     Wt Readings from Last 3 Encounters:  07/13/24 164 lb (74.4 kg)  05/29/24 171 lb 15.3 oz (78 kg)  04/07/24 151 lb 6.4 oz (68.7 kg)     GEN: Chronically ill-appearing HEENT: Normal NECK: No JVD; No carotid bruits CARDIAC: RRR, no murmurs, rubs, gallops RESPIRATORY:  Clear to auscultation without rales, wheezing or rhonchi  ABDOMEN: Soft, non-tender, non-distended MUSCULOSKELETAL: 2+ bilateral edema; No deformity  SKIN: Warm and dry NEUROLOGIC:  Alert and oriented x 3 PSYCHIATRIC:  Normal affect   ASSESSMENT:    1. Chest pain of uncertain etiology   2. Coronary artery disease involving native coronary artery of native heart, unspecified whether angina present   3. Primary hypertension   4. Mixed hyperlipidemia   5. Sinus bradycardia   6. Hx of CABG   7. Bilateral lower extremity edema     PLAN:    CAD:  status post CABG in 1996, hypertension, hyperlipidemia who presents for follow-up.  Previously followed with Dr. Dann.  He had 2 ED visits for chest pain in August 2024.  Stress test or heart catheterization was discussed but he stated he was unable to lay flat on table and declined any imaging or procedures.  He was started on Imdur  30 mg daily and Ranexa  500 mg twice daily but did not tolerate Imdur  due to headaches -Continue warfarin -Continue rosuvastatin , Zetia  -Continue Ranexa .  He is reporting atypical chest pain.  Will increase Ranexa  to 1000 mg twice daily.  Previously had declined stress testing but now agreeable.  Recommend stress PET  to evaluate for ischemia  Bradycardia: Wife has noted heart rate down to 40s.   Zio patch x 7 days 02/2024 showed no significant arrhythmias, low resting heart rate (average 58 bpm)  Hypertension: On amlodipine  5 mg daily.   Appears controlled  Lower extremity edema: Has been on Lasix  40 mg every other day.  Does have significant lower extremity edema on exam but also with history of lymphedema.  BNP mildly elevated 02/2024 (191).  Echocardiogram 03/2024 showed EF 60 to 65%, normal RV function, moderate left atrial enlargement, no significant valvular disease, dilated aortic root measuring 44 mm.   - Worsening renal function since he was started on Lasix .  Suspect his edema is due to his lymphedema and Lasix  is not effective for this.  Recommend holding Lasix  for now.  Check BMET, BNP  Hyperlipidemia: On rosuvastatin  40 mg daily, Zetia  10 mg daily.  LDL 46 on 02/26/2024  Hypercoagulable disorder: On Coumadin  for reported history of hypercoagulable disorder.  Unclear diagnosis, he does not have any history of PE or DVT but reports family history.  States that he was diagnosed with hypercoagulable disorder at [facility] in 1990s.  Would recommend establishing with hematology to see if does indeed have hypercoagulable disorder and whether anticoagulation recommended.  If anticoagulation needed, would recommend discussing with hematology if can switch from Coumadin  to DOAC - He was seen by hematology 04/07/2024.  Per note he has elevated fibrinopeptide A and mild resistance to activated protein C.  Recommended continuing anticoagulation.  Discussed switching to DOACs but patient preference was to remain on Coumadin   RTC in 3 months  Informed Consent   Shared Decision Making/Informed Consent The risks [chest pain, shortness of breath, cardiac arrhythmias, dizziness, blood pressure fluctuations, myocardial infarction, stroke/transient ischemic attack, nausea, vomiting, allergic reaction, radiation exposure, metallic taste sensation and life-threatening complications (estimated to be 1 in 10,000)], benefits (risk stratification, diagnosing coronary artery disease, treatment guidance) and alternatives of a cardiac PET stress test were  discussed in detail with Mr. Zechman and he agrees to proceed.      Medication Adjustments/Labs and Tests Ordered: Current medicines are reviewed at length with the patient today.  Concerns regarding medicines are outlined above.  Orders Placed This Encounter  Procedures   NM PET CT CARDIAC PERFUSION MULTI W/ABSOLUTE BLOODFLOW   Brain natriuretic peptide   Basic Metabolic Panel (BMET)   Cardiac Stress Test: Informed Consent Details: Physician/Practitioner Attestation; Transcribe to consent form and obtain patient signature   Meds ordered this encounter  Medications   DISCONTD: ranolazine  (RANEXA ) 500 MG 12 hr tablet    Sig: Take 2 tablets (1,000 mg total) by mouth 2 (two) times daily.    Dispense:  120 tablet    Refill:  11   ranolazine  (RANEXA ) 1000 MG SR tablet    Sig: Take 1 tablet (1,000 mg total) by mouth 2 (two) times daily.    Dispense:  180 tablet    Refill:  3    Patient Instructions  Medication Instructions:  Increase Ranexa  1000mg   twice a day Continue all current medications *If you need a refill on your cardiac medications before your next appointment, please call your pharmacy*  Lab Work: Bmet, bnp today If you have labs (blood work) drawn today and your tests are completely normal, you will receive your results only by: MyChart Message (if you have MyChart) OR A paper copy in the mail If you have any lab test that is abnormal or we need to change your  treatment, we will call you to review the results.  Testing/Procedures:    Please report to Radiology at the [facility] Main Entrance 30 minutes early for your test.  [address] [place], [place] [address]        How to Prepare for Your Cardiac PET/CT Stress Test:  Nothing to eat or drink, except water , 3 hours prior to arrival time.  NO caffeine/decaffeinated products, or chocolate 12 hours prior to arrival. (Please note decaffeinated beverages (teas/coffees) still contain caffeine).  If  you have caffeine within 12 hours prior, the test will need to be rescheduled.  Medication instructions: Do not take erectile dysfunction medications for 72 hours prior to test (sildenafil, tadalafil) Do not take nitrates (isosorbide  mononitrate, Ranexa ) the day before or day of test Do not take tamsulosin the day before or morning of test Hold theophylline containing medications for 12 hours. Hold Dipyridamole 48 hours prior to the test.  Diabetic Preparation: If able to eat breakfast prior to 3 hour fasting, you may take all medications, including your insulin. Do not worry if you miss your breakfast dose of insulin - start at your next meal. If you do not eat prior to 3 hour fast-Hold all diabetes (oral and insulin) medications. Patients who wear a continuous glucose monitor MUST remove the device prior to scanning.  You may take your remaining medications with water .  NO perfume, cologne or lotion on chest or abdomen area.   Total time is 1 to 2 hours; you may want to bring reading material for the waiting time.  IF YOU THINK YOU MAY BE PREGNANT, OR ARE NURSING PLEASE INFORM THE TECHNOLOGIST.  In preparation for your appointment, medication and supplies will be purchased.  Appointment availability is limited, so if you need to cancel or reschedule, please call the Radiology Department Scheduler at [phone] 24 hours in advance to avoid a cancellation fee of $100.00  What to Expect When you Arrive:  Once you arrive and check in for your appointment, you will be taken to a preparation room within the Radiology Department.  A technologist or Nurse will obtain your medical history, verify that you are correctly prepped for the exam, and explain the procedure.  Afterwards, an IV will be started in your arm and electrodes will be placed on your skin for EKG monitoring during the stress portion of the exam. Then you will be escorted to the PET/CT scanner.  There, staff will get you  positioned on the scanner and obtain a blood pressure and EKG.  During the exam, you will continue to be connected to the EKG and blood pressure machines.  A small, safe amount of a radioactive tracer will be injected in your IV to obtain a series of pictures of your heart along with an injection of a stress agent.    After your Exam:  It is recommended that you eat a meal and drink a caffeinated beverage to counter act any effects of the stress agent.  Drink plenty of fluids for the remainder of the day and urinate frequently for the first couple of hours after the exam.  Your doctor will inform you of your test results within 7-10 business days.  For more information and frequently asked questions, please visit our website: [email or web address]  For questions about your test or how to prepare for your test, please call: Cardiac Imaging Nurse Navigators Office: [phone]   Follow-Up: At [facility], you and your health needs are our  priority.  As part of our continuing mission to provide you with exceptional heart care, our providers are all part of one team.  This team includes your primary Cardiologist (physician) and Advanced Practice Providers or APPs (Physician Assistants and Nurse Practitioners) who all work together to provide you with the care you need, when you need it.  Your next appointment:   3 month  Provider:    Dr. KATE  We recommend signing up for the patient portal called MyChart.  Sign up information is provided on this After Visit Summary.  MyChart is used to connect with patients for Virtual Visits (Telemedicine).  Patients are able to view lab/test results, encounter notes, upcoming appointments, etc.  Non-urgent messages can be sent to your provider as well.   To learn more about what you can do with MyChart, go to [email or web address].   Other Instructions none       Signed, Lonni LITTIE KATE, MD  07/13/2024 10:27 AM      [facility]

## 2024-07-13 ENCOUNTER — Encounter: Payer: Self-pay | Admitting: Cardiology

## 2024-07-13 ENCOUNTER — Ambulatory Visit: Attending: Cardiology | Admitting: Cardiology

## 2024-07-13 VITALS — BP 132/69 | HR 51 | Ht 72.0 in | Wt 164.0 lb

## 2024-07-13 DIAGNOSIS — I2510 Atherosclerotic heart disease of native coronary artery without angina pectoris: Secondary | ICD-10-CM | POA: Insufficient documentation

## 2024-07-13 DIAGNOSIS — R600 Localized edema: Secondary | ICD-10-CM | POA: Insufficient documentation

## 2024-07-13 DIAGNOSIS — R001 Bradycardia, unspecified: Secondary | ICD-10-CM | POA: Insufficient documentation

## 2024-07-13 DIAGNOSIS — Z951 Presence of aortocoronary bypass graft: Secondary | ICD-10-CM | POA: Diagnosis not present

## 2024-07-13 DIAGNOSIS — I10 Essential (primary) hypertension: Secondary | ICD-10-CM | POA: Insufficient documentation

## 2024-07-13 DIAGNOSIS — E782 Mixed hyperlipidemia: Secondary | ICD-10-CM | POA: Diagnosis not present

## 2024-07-13 DIAGNOSIS — R079 Chest pain, unspecified: Secondary | ICD-10-CM | POA: Diagnosis not present

## 2024-07-13 MED ORDER — RANOLAZINE ER 1000 MG PO TB12: 1000.0000 mg | ORAL_TABLET | Freq: Two times a day (BID) | ORAL | 3 refills | Status: DC

## 2024-07-13 MED ORDER — RANOLAZINE ER 500 MG PO TB12: 1000.0000 mg | ORAL_TABLET | Freq: Two times a day (BID) | ORAL | 11 refills | Status: DC

## 2024-07-13 NOTE — Patient Instructions (Signed)
 Medication Instructions:  Increase Ranexa  1000mg   twice a day Continue all current medications *If you need a refill on your cardiac medications before your next appointment, please call your pharmacy*  Lab Work: Bmet, bnp today If you have labs (blood work) drawn today and your tests are completely normal, you will receive your results only by: MyChart Message (if you have MyChart) OR A paper copy in the mail If you have any lab test that is abnormal or we need to change your treatment, we will call you to review the results.  Testing/Procedures:    Please report to Radiology at the [facility] Main Entrance 30 minutes early for your test.  [address] [place], [place] [address]        How to Prepare for Your Cardiac PET/CT Stress Test:  Nothing to eat or drink, except water , 3 hours prior to arrival time.  NO caffeine/decaffeinated products, or chocolate 12 hours prior to arrival. (Please note decaffeinated beverages (teas/coffees) still contain caffeine).  If you have caffeine within 12 hours prior, the test will need to be rescheduled.  Medication instructions: Do not take erectile dysfunction medications for 72 hours prior to test (sildenafil, tadalafil) Do not take nitrates (isosorbide  mononitrate, Ranexa ) the day before or day of test Do not take tamsulosin the day before or morning of test Hold theophylline containing medications for 12 hours. Hold Dipyridamole 48 hours prior to the test.  Diabetic Preparation: If able to eat breakfast prior to 3 hour fasting, you may take all medications, including your insulin. Do not worry if you miss your breakfast dose of insulin - start at your next meal. If you do not eat prior to 3 hour fast-Hold all diabetes (oral and insulin) medications. Patients who wear a continuous glucose monitor MUST remove the device prior to scanning.  You may take your remaining medications with water .  NO perfume, cologne or lotion on  chest or abdomen area.   Total time is 1 to 2 hours; you may want to bring reading material for the waiting time.  IF YOU THINK YOU MAY BE PREGNANT, OR ARE NURSING PLEASE INFORM THE TECHNOLOGIST.  In preparation for your appointment, medication and supplies will be purchased.  Appointment availability is limited, so if you need to cancel or reschedule, please call the Radiology Department Scheduler at [phone] 24 hours in advance to avoid a cancellation fee of $100.00  What to Expect When you Arrive:  Once you arrive and check in for your appointment, you will be taken to a preparation room within the Radiology Department.  A technologist or Nurse will obtain your medical history, verify that you are correctly prepped for the exam, and explain the procedure.  Afterwards, an IV will be started in your arm and electrodes will be placed on your skin for EKG monitoring during the stress portion of the exam. Then you will be escorted to the PET/CT scanner.  There, staff will get you positioned on the scanner and obtain a blood pressure and EKG.  During the exam, you will continue to be connected to the EKG and blood pressure machines.  A small, safe amount of a radioactive tracer will be injected in your IV to obtain a series of pictures of your heart along with an injection of a stress agent.    After your Exam:  It is recommended that you eat a meal and drink a caffeinated beverage to counter act any effects of the stress agent.  Drink plenty of fluids for the remainder of the day and urinate frequently for the first couple of hours after the exam.  Your doctor will inform you of your test results within 7-10 business days.  For more information and frequently asked questions, please visit our website: [email or web address]  For questions about your test or how to prepare for your test, please call: Cardiac Imaging Nurse Navigators Office: [phone]   Follow-Up: At [facility], you and your health needs are our priority.  As part of our continuing mission to provide you with exceptional heart care, our providers are all part of one team.  This team includes your primary Cardiologist (physician) and Advanced Practice Providers or APPs (Physician Assistants and Nurse Practitioners) who all work together to provide you with the care you need, when you need it.  Your next appointment:   3 month  Provider:    Dr. KATE  We recommend signing up for the patient portal called MyChart.  Sign up information is provided on this After Visit Summary.  MyChart is used to connect with patients for Virtual Visits (Telemedicine).  Patients are able to view lab/test results, encounter notes, upcoming appointments, etc.  Non-urgent messages can be sent to your provider as well.   To learn more about what you can do with MyChart, go to [email or web address].   Other Instructions none

## 2024-07-14 LAB — BASIC METABOLIC PANEL WITH GFR
BUN/Creatinine Ratio: 26 — ABNORMAL HIGH (ref 10–24)
BUN: 22 mg/dL (ref 8–27)
CO2: 22 mmol/L (ref 20–29)
Calcium: 10.7 mg/dL — ABNORMAL HIGH (ref 8.6–10.2)
Chloride: 107 mmol/L — ABNORMAL HIGH (ref 96–106)
Creatinine, Ser: 0.86 mg/dL (ref 0.76–1.27)
Glucose: 75 mg/dL (ref 70–99)
Potassium: 4.2 mmol/L (ref 3.5–5.2)
Sodium: 143 mmol/L (ref 134–144)
eGFR: 90 mL/min/1.73 (ref >59)

## 2024-07-14 LAB — BRAIN NATRIURETIC PEPTIDE: BNP: 113.6 pg/mL — ABNORMAL HIGH (ref 0.0–100.0)

## 2024-07-15 ENCOUNTER — Ambulatory Visit: Payer: Self-pay | Admitting: Cardiology

## 2024-07-21 ENCOUNTER — Ambulatory Visit: Payer: Medicare Other | Admitting: Physician Assistant

## 2024-07-29 ENCOUNTER — Ambulatory Visit

## 2024-08-09 ENCOUNTER — Ambulatory Visit: Attending: Interventional Cardiology | Admitting: *Deleted

## 2024-08-09 DIAGNOSIS — Z5181 Encounter for therapeutic drug level monitoring: Secondary | ICD-10-CM | POA: Insufficient documentation

## 2024-08-09 DIAGNOSIS — D6859 Other primary thrombophilia: Secondary | ICD-10-CM | POA: Insufficient documentation

## 2024-08-09 LAB — POCT INR: INR: 1.5 — AB (ref 2.0–3.0)

## 2024-08-09 NOTE — Patient Instructions (Signed)
 Description   Today and tomorrow take 1 tablet of warfarin then continue taking warfarin 1/2 tablet daily EXCEPT 1 tablet on Thursdays.  Stay consistent with greens and ensure each week (Ensure:1 time per day) Recheck INR in 1 week (normally 6 weeks).  Coumadin  Clinic [phone]

## 2024-08-09 NOTE — Progress Notes (Signed)
 Description   Today and tomorrow take 1 tablet of warfarin then continue taking warfarin 1/2 tablet daily EXCEPT 1 tablet on Thursdays.  Stay consistent with greens and ensure each week (Ensure:1 time per day) Recheck INR in 1 week (normally 6 weeks).  Coumadin  Clinic [phone]

## 2024-08-11 DIAGNOSIS — R8289 Other abnormal findings on cytological and histological examination of urine: Secondary | ICD-10-CM | POA: Diagnosis not present

## 2024-08-11 DIAGNOSIS — R310 Gross hematuria: Secondary | ICD-10-CM | POA: Diagnosis not present

## 2024-08-11 DIAGNOSIS — R3911 Hesitancy of micturition: Secondary | ICD-10-CM | POA: Diagnosis not present

## 2024-08-11 DIAGNOSIS — R3914 Feeling of incomplete bladder emptying: Secondary | ICD-10-CM | POA: Diagnosis not present

## 2024-08-11 DIAGNOSIS — N401 Enlarged prostate with lower urinary tract symptoms: Secondary | ICD-10-CM | POA: Diagnosis not present

## 2024-08-16 ENCOUNTER — Encounter (HOSPITAL_COMMUNITY): Payer: Self-pay | Admitting: Cardiology

## 2024-08-17 ENCOUNTER — Ambulatory Visit: Attending: Interventional Cardiology | Admitting: Pharmacist

## 2024-08-17 DIAGNOSIS — D6859 Other primary thrombophilia: Secondary | ICD-10-CM | POA: Insufficient documentation

## 2024-08-17 DIAGNOSIS — Z5181 Encounter for therapeutic drug level monitoring: Secondary | ICD-10-CM | POA: Diagnosis not present

## 2024-08-17 LAB — POCT INR: INR: 1.6 — AB (ref 2.0–3.0)

## 2024-08-17 NOTE — Patient Instructions (Signed)
 Description   Take 1 whole tablet today and then change schedule to 1 whole tablet on Tuesdays and Thursdays and 1/2 tablet the rest of the week Stay consistent with greens and ensure each week (Ensure:1 time per day) Recheck INR in 1 week (normally 6 weeks).  Coumadin  Clinic [phone]

## 2024-08-17 NOTE — Progress Notes (Signed)
 INR 1.6; Please see anticoagulation encounter   Description   Take 1 whole tablet today and then change schedule to 1 whole tablet on Tuesdays and Thursdays and 1/2 tablet the rest of the week Stay consistent with greens and ensure each week (Ensure:1 time per day) Recheck INR in 1 week (normally 6 weeks).  Coumadin  Clinic [phone]

## 2024-08-19 ENCOUNTER — Other Ambulatory Visit: Payer: Self-pay | Admitting: Cardiology

## 2024-08-19 DIAGNOSIS — D6859 Other primary thrombophilia: Secondary | ICD-10-CM

## 2024-08-19 NOTE — Telephone Encounter (Signed)
 WARFARIN 5MG  REFILL Hypercoagulable disorder  Last INR 08/17/24 Last OV 07/13/24

## 2024-08-25 ENCOUNTER — Ambulatory Visit: Attending: Interventional Cardiology | Admitting: *Deleted

## 2024-08-25 DIAGNOSIS — Z5181 Encounter for therapeutic drug level monitoring: Secondary | ICD-10-CM | POA: Diagnosis not present

## 2024-08-25 DIAGNOSIS — D6859 Other primary thrombophilia: Secondary | ICD-10-CM | POA: Insufficient documentation

## 2024-08-25 LAB — POCT INR: POC INR: 1.6

## 2024-08-25 NOTE — Patient Instructions (Signed)
 Description   Take 1.5 tablets of warfarin today and then START taking warfarin 1/2 a tablet daily excpet for 1 tablet on Tuesday, Thursday and Saturdays. Recheck INR in 1 week.  [facility]ou[facility]in  Clinic [phone]

## 2024-08-25 NOTE — Progress Notes (Signed)
 INR 1.6; Please see anticoagulation encounter  Lab Results  Component Value Date   INR 1.6 08/25/2024   INR 1.6 (A) 08/17/2024   INR 1.5 (A) 08/09/2024    Description   Take 1.5 tablets of warfarin today and then START taking warfarin 1/2 a tablet daily excpet for 1 tablet on Tuesday, Thursday and Saturdays. Recheck INR in 1 week.  Coumadin  Clinic [phone]

## 2024-09-01 ENCOUNTER — Ambulatory Visit: Attending: Interventional Cardiology

## 2024-09-01 DIAGNOSIS — D6859 Other primary thrombophilia: Secondary | ICD-10-CM | POA: Insufficient documentation

## 2024-09-01 DIAGNOSIS — Z5181 Encounter for therapeutic drug level monitoring: Secondary | ICD-10-CM | POA: Insufficient documentation

## 2024-09-01 LAB — POCT INR: INR: 1.8 — AB (ref 2.0–3.0)

## 2024-09-01 NOTE — Patient Instructions (Signed)
 START taking warfarin 1 tablet daily, except 0.5 tablet every Monday, Wednesday and Friday.  Recheck INR in 2 weeks.  Coumadin  Clinic [phone]

## 2024-09-01 NOTE — Progress Notes (Signed)
 INR 1.8 Please see anticoagulation encounter START taking warfarin 1 tablet daily, except 0.5 tablet every Monday, Wednesday and Friday.  Recheck INR in 2 weeks.  Coumadin  Clinic [phone]

## 2024-09-02 ENCOUNTER — Encounter: Payer: Self-pay | Admitting: Cardiology

## 2024-09-02 NOTE — Telephone Encounter (Signed)
 Recommend lasix  40 mg daily as needed.  Would monitor daily weights and can take lasix  if gains more than 3 lbs in one day or 5lbs in one week

## 2024-09-05 ENCOUNTER — Other Ambulatory Visit: Payer: Self-pay | Admitting: *Deleted

## 2024-09-05 MED ORDER — FUROSEMIDE 40 MG PO TABS: 40.0000 mg | ORAL_TABLET | ORAL | 3 refills | Status: DC | PRN

## 2024-09-05 NOTE — Progress Notes (Signed)
 Called and made spouse, Arland aware and mychart message sent to patient that per Dr. Kate recommended starting Furosemide  40 mg as needed for weight gain greater than 3 lbs in a day or 5 lbs in week. Medications sent to patient pharmacy . Sent patient mychart message and left voicemail on personal cell phone.

## 2024-09-07 ENCOUNTER — Other Ambulatory Visit: Payer: Self-pay | Admitting: Internal Medicine

## 2024-09-15 ENCOUNTER — Ambulatory Visit: Attending: Interventional Cardiology

## 2024-09-15 DIAGNOSIS — D6859 Other primary thrombophilia: Secondary | ICD-10-CM | POA: Insufficient documentation

## 2024-09-15 DIAGNOSIS — Z5181 Encounter for therapeutic drug level monitoring: Secondary | ICD-10-CM | POA: Diagnosis not present

## 2024-09-15 LAB — POCT INR: INR: 2 (ref 2.0–3.0)

## 2024-09-15 NOTE — Progress Notes (Signed)
 INR 2.0 Please see anticoagulation encounter Continue taking warfarin 1 tablet daily, except 0.5 tablet every Monday, Wednesday and Friday.  Recheck INR in 5 weeks.  Coumadin  Clinic [phone]

## 2024-09-15 NOTE — Patient Instructions (Signed)
 Continue taking warfarin 1 tablet daily, except 0.5 tablet every Monday, Wednesday and Friday.  Recheck INR in 5 weeks.  Coumadin  Clinic [phone]

## 2024-09-20 ENCOUNTER — Ambulatory Visit: Admitting: Podiatry

## 2024-09-20 DIAGNOSIS — M79674 Pain in right toe(s): Secondary | ICD-10-CM | POA: Diagnosis not present

## 2024-09-20 DIAGNOSIS — B351 Tinea unguium: Secondary | ICD-10-CM | POA: Diagnosis not present

## 2024-09-20 DIAGNOSIS — M79675 Pain in left toe(s): Secondary | ICD-10-CM

## 2024-09-20 DIAGNOSIS — I890 Lymphedema, not elsewhere classified: Secondary | ICD-10-CM | POA: Diagnosis not present

## 2024-09-20 NOTE — Progress Notes (Unsigned)
  Subjective:  Patient ID: ASAF ELMQUIST, male    DOB: 1946-05-16,  MRN: [id number]  Chief Complaint  Patient presents with   Nail Problem    RM 1 RFC    78 y.o. male presents with the above complaint. History confirmed with patient.   Objective:  Physical Exam: warm, good capillary refill, no trophic changes or ulcerative lesions, normal DP and PT pulses, normal sensory exam, and right hallux distal tip has a callus, he has thick elongated dystrophic mycotic nails on both feet, he has severe pitting edema and lymphedema from the thighs down.  Right lower extremity measurements: - Ankle circumference 14 inches - Proximal calf circumference 13.2 inches  Left lower extremity measurements - Ankle circumference 13.2 inches - Proximal calf circumference 13 inches   Assessment:   1. Pain due to onychomycosis of toenails of both feet   2. Lymphedema       Plan:  Patient was evaluated and treated and all questions answered.  Discussed the etiology and treatment options for the condition in detail with the patient. Recommended debridement of the nails today. Sharp and mechanical debridement performed of all painful and mycotic nails today. Nails debrided in length and thickness using a nail nipper to level of comfort. Follow up as needed for painful nails.  We also discussed treatment of his lymphedema.  I think at home lymphedema therapy pumps would be beneficial for him.  With his current memory issues getting him out of the house to therapeutic treatments would be quite difficult for him and his wife.  Measurements were taken today of the lower extremities and referral will be sent to [facility].  No follow-ups on file.

## 2024-09-21 ENCOUNTER — Encounter: Payer: Self-pay | Admitting: Cardiology

## 2024-09-21 DIAGNOSIS — R079 Chest pain, unspecified: Secondary | ICD-10-CM

## 2024-09-21 NOTE — Telephone Encounter (Signed)
 Yes can we reorder stress PET?

## 2024-09-29 ENCOUNTER — Other Ambulatory Visit: Payer: Self-pay

## 2024-09-29 ENCOUNTER — Encounter: Payer: Self-pay | Admitting: Family Medicine

## 2024-10-03 MED ORDER — RANOLAZINE ER 1000 MG PO TB12: 1000.0000 mg | ORAL_TABLET | Freq: Two times a day (BID) | ORAL | 2 refills | Status: DC

## 2024-10-03 NOTE — Telephone Encounter (Signed)
 Copied from CRM [id number]. Topic: General - Other >> Oct 03, 2024  2:38 PM Sasha M wrote: Reason for CRM: Pt wife calling in for update on FL2 form. Requesting call back from office manager at [phone] >> Oct 03, 2024  3:56 PM Candice F wrote: LVM to let pt know that we received wife request to revise the FL2 form on Thursday; the day that Dr. Jordan is out of the office and that she will here an update once Dr. Jordan get to her message. Also, let pt know that forms usually take up to 10-14 business days for completion.     Pt wife called back and stated that she is unable to send any of his meds over until this form is completed and updated. Pt wife is asking that this be taking care of before Monday if all possible. Pt seems very emotional about the situation.

## 2024-10-04 ENCOUNTER — Telehealth (HOSPITAL_COMMUNITY): Payer: Self-pay | Admitting: Emergency Medicine

## 2024-10-04 NOTE — Telephone Encounter (Signed)
 Reaching out to patient to offer assistance regarding upcoming cardiac imaging study; pt verbalizes understanding of appt date/time, parking situation and where to check in, pre-test NPO status and medications ordered, and verified current allergies; name and call back number provided for further questions should they arise Rockwell Alexandria RN Navigator Cardiac Imaging [facility] [phone] office [phone] cell

## 2024-10-04 NOTE — Telephone Encounter (Signed)
 Spoke with wife. Advised documents are completed and ready to be picked up in front office. Patient was very thankful and emotional. Advised patient the clinic is always here if she needs help.

## 2024-10-05 ENCOUNTER — Encounter (HOSPITAL_COMMUNITY)
Admission: RE | Admit: 2024-10-05 | Discharge: 2024-10-05 | Disposition: A | Discharge status: Home / Self Care | Source: Ambulatory Visit | Attending: Cardiology | Admitting: Cardiology

## 2024-10-05 ENCOUNTER — Ambulatory Visit: Payer: Self-pay | Admitting: Cardiology

## 2024-10-05 DIAGNOSIS — R079 Chest pain, unspecified: Secondary | ICD-10-CM | POA: Insufficient documentation

## 2024-10-05 LAB — NM PET CT CARDIAC PERFUSION MULTI W/ABSOLUTE BLOODFLOW
MBFR: 2
Nuc Rest EF: 51 %
Nuc Stress EF: 56 %
Peak HR: 53 {beats}/min
Rest HR: 42 {beats}/min
Rest MBF: 0.4 ml/g/min
Rest Nuclear Isotope Dose: 19.4 mCi
ST Depression (mm): 0 mm
Stress MBF: 0.8 ml/g/min
Stress Nuclear Isotope Dose: 19.4 mCi
TID: 1.07

## 2024-10-05 MED ORDER — RUBIDIUM RB82 GENERATOR (RUBYFILL)
19.4300 | PACK | Freq: Once | INTRAVENOUS | Status: AC
  Administered 2024-10-05: 19.43 via INTRAVENOUS

## 2024-10-05 MED ORDER — REGADENOSON 0.4 MG/5ML IV SOLN
INTRAVENOUS | Status: AC
  Filled 2024-10-05: qty 5

## 2024-10-05 MED ORDER — RUBIDIUM RB82 GENERATOR (RUBYFILL)
19.4100 | PACK | Freq: Once | INTRAVENOUS | Status: AC
Start: 2024-10-05 — End: 2024-10-05
  Administered 2024-10-05: 19.41 via INTRAVENOUS

## 2024-10-05 MED ORDER — REGADENOSON 0.4 MG/5ML IV SOLN
0.4000 mg | Freq: Once | INTRAVENOUS | Status: AC
  Administered 2024-10-05: 0.4 mg via INTRAVENOUS

## 2024-10-06 ENCOUNTER — Emergency Department (HOSPITAL_COMMUNITY)
Admission: EM | Admit: 2024-10-06 | Discharge: 2024-10-07 | Disposition: A | Discharge status: Home / Self Care | Attending: Emergency Medicine | Admitting: Emergency Medicine

## 2024-10-06 DIAGNOSIS — Z7901 Long term (current) use of anticoagulants: Secondary | ICD-10-CM | POA: Diagnosis not present

## 2024-10-06 DIAGNOSIS — Z91040 Latex allergy status: Secondary | ICD-10-CM | POA: Insufficient documentation

## 2024-10-06 DIAGNOSIS — M25519 Pain in unspecified shoulder: Secondary | ICD-10-CM | POA: Diagnosis not present

## 2024-10-06 DIAGNOSIS — Z951 Presence of aortocoronary bypass graft: Secondary | ICD-10-CM | POA: Diagnosis not present

## 2024-10-06 DIAGNOSIS — I2510 Atherosclerotic heart disease of native coronary artery without angina pectoris: Secondary | ICD-10-CM | POA: Insufficient documentation

## 2024-10-06 DIAGNOSIS — I517 Cardiomegaly: Secondary | ICD-10-CM | POA: Diagnosis not present

## 2024-10-06 DIAGNOSIS — S199XXA Unspecified injury of neck, initial encounter: Secondary | ICD-10-CM | POA: Diagnosis not present

## 2024-10-06 DIAGNOSIS — I10 Essential (primary) hypertension: Secondary | ICD-10-CM | POA: Insufficient documentation

## 2024-10-06 DIAGNOSIS — G319 Degenerative disease of nervous system, unspecified: Secondary | ICD-10-CM | POA: Diagnosis not present

## 2024-10-06 DIAGNOSIS — I6782 Cerebral ischemia: Secondary | ICD-10-CM | POA: Diagnosis not present

## 2024-10-06 DIAGNOSIS — S4991XA Unspecified injury of right shoulder and upper arm, initial encounter: Secondary | ICD-10-CM | POA: Diagnosis present

## 2024-10-06 DIAGNOSIS — Z043 Encounter for examination and observation following other accident: Secondary | ICD-10-CM | POA: Diagnosis not present

## 2024-10-06 DIAGNOSIS — Z79899 Other long term (current) drug therapy: Secondary | ICD-10-CM | POA: Insufficient documentation

## 2024-10-06 DIAGNOSIS — G309 Alzheimer's disease, unspecified: Secondary | ICD-10-CM | POA: Insufficient documentation

## 2024-10-06 DIAGNOSIS — S0990XA Unspecified injury of head, initial encounter: Secondary | ICD-10-CM | POA: Diagnosis not present

## 2024-10-06 DIAGNOSIS — F0280 Dementia in other diseases classified elsewhere without behavioral disturbance: Secondary | ICD-10-CM | POA: Insufficient documentation

## 2024-10-06 DIAGNOSIS — R4182 Altered mental status, unspecified: Secondary | ICD-10-CM | POA: Diagnosis not present

## 2024-10-06 DIAGNOSIS — S40011A Contusion of right shoulder, initial encounter: Secondary | ICD-10-CM | POA: Diagnosis not present

## 2024-10-06 DIAGNOSIS — W19XXXA Unspecified fall, initial encounter: Secondary | ICD-10-CM | POA: Insufficient documentation

## 2024-10-06 DIAGNOSIS — I700 Atherosclerosis of aorta: Secondary | ICD-10-CM | POA: Diagnosis not present

## 2024-10-06 DIAGNOSIS — Y92019 Unspecified place in single-family (private) house as the place of occurrence of the external cause: Secondary | ICD-10-CM | POA: Insufficient documentation

## 2024-10-06 DIAGNOSIS — R310 Gross hematuria: Secondary | ICD-10-CM | POA: Diagnosis not present

## 2024-10-06 DIAGNOSIS — M19011 Primary osteoarthritis, right shoulder: Secondary | ICD-10-CM | POA: Diagnosis not present

## 2024-10-06 DIAGNOSIS — R404 Transient alteration of awareness: Secondary | ICD-10-CM | POA: Diagnosis not present

## 2024-10-06 NOTE — ED Triage Notes (Signed)
 Pt coming from home unwitnessed fall approx 2230 tonight. Denies LOC or head injury. + thinners. Coumadin . Pt endorses R shoulder pain. Has hx of dementia. Wife found pt on the floor hard wood in room. Small scratch to R shoulder. Pt alert and oriented x3. VSS  EMS  BP120/64  O2 94% RA HR 54 RR18  98.40F Oral

## 2024-10-06 NOTE — ED Provider Notes (Incomplete)
 78 year old male chronically anticoagulated on warfarin comes in following an unwitnessed fall.  He has dementia and cannot give any relevant history.  There is no external signs of trauma on exam.

## 2024-10-07 ENCOUNTER — Other Ambulatory Visit: Payer: Self-pay

## 2024-10-07 ENCOUNTER — Encounter (HOSPITAL_COMMUNITY): Payer: Self-pay

## 2024-10-07 ENCOUNTER — Emergency Department (HOSPITAL_COMMUNITY)

## 2024-10-07 DIAGNOSIS — I517 Cardiomegaly: Secondary | ICD-10-CM | POA: Diagnosis not present

## 2024-10-07 DIAGNOSIS — M19011 Primary osteoarthritis, right shoulder: Secondary | ICD-10-CM | POA: Diagnosis not present

## 2024-10-07 DIAGNOSIS — G319 Degenerative disease of nervous system, unspecified: Secondary | ICD-10-CM | POA: Diagnosis not present

## 2024-10-07 DIAGNOSIS — I700 Atherosclerosis of aorta: Secondary | ICD-10-CM | POA: Diagnosis not present

## 2024-10-07 DIAGNOSIS — S40011A Contusion of right shoulder, initial encounter: Secondary | ICD-10-CM | POA: Diagnosis not present

## 2024-10-07 DIAGNOSIS — I6782 Cerebral ischemia: Secondary | ICD-10-CM | POA: Diagnosis not present

## 2024-10-07 DIAGNOSIS — S0990XA Unspecified injury of head, initial encounter: Secondary | ICD-10-CM | POA: Diagnosis not present

## 2024-10-07 DIAGNOSIS — S199XXA Unspecified injury of neck, initial encounter: Secondary | ICD-10-CM | POA: Diagnosis not present

## 2024-10-07 DIAGNOSIS — Z043 Encounter for examination and observation following other accident: Secondary | ICD-10-CM | POA: Diagnosis not present

## 2024-10-07 LAB — CBC WITH DIFFERENTIAL/PLATELET
Abs Immature Granulocytes: 0.01 K/uL (ref 0.00–0.07)
Basophils Absolute: 0 K/uL (ref 0.0–0.1)
Basophils Relative: 1 %
Eosinophils Absolute: 0.1 K/uL (ref 0.0–0.5)
Eosinophils Relative: 2 %
HCT: 37.2 % — ABNORMAL LOW (ref 39.0–52.0)
Hemoglobin: 12.2 g/dL — ABNORMAL LOW (ref 13.0–17.0)
Immature Granulocytes: 0 %
Lymphocytes Relative: 16 %
Lymphs Abs: 0.7 K/uL (ref 0.7–4.0)
MCH: 33.2 pg (ref 26.0–34.0)
MCHC: 32.8 g/dL (ref 30.0–36.0)
MCV: 101.4 fL — ABNORMAL HIGH (ref 80.0–100.0)
Monocytes Absolute: 0.6 K/uL (ref 0.1–1.0)
Monocytes Relative: 14 %
Neutro Abs: 3 K/uL (ref 1.7–7.7)
Neutrophils Relative %: 67 %
Platelets: 143 K/uL — ABNORMAL LOW (ref 150–400)
RBC: 3.67 MIL/uL — ABNORMAL LOW (ref 4.22–5.81)
RDW: 13.3 % (ref 11.5–15.5)
WBC: 4.5 K/uL (ref 4.0–10.5)
nRBC: 0 % (ref 0.0–0.2)

## 2024-10-07 LAB — I-STAT CHEM 8, ED
BUN: 24 mg/dL — ABNORMAL HIGH (ref 8–23)
Calcium, Ion: 1.33 mmol/L (ref 1.15–1.40)
Chloride: 109 mmol/L (ref 98–111)
Creatinine, Ser: 1 mg/dL (ref 0.61–1.24)
Glucose, Bld: 93 mg/dL (ref 70–99)
HCT: 36 % — ABNORMAL LOW (ref 39.0–52.0)
Hemoglobin: 12.2 g/dL — ABNORMAL LOW (ref 13.0–17.0)
Potassium: 4.1 mmol/L (ref 3.5–5.1)
Sodium: 145 mmol/L (ref 135–145)
TCO2: 26 mmol/L (ref 22–32)

## 2024-10-07 LAB — PROTIME-INR
INR: 1.6 — ABNORMAL HIGH (ref 0.8–1.2)
Prothrombin Time: 20.1 s — ABNORMAL HIGH (ref 11.4–15.2)

## 2024-10-07 NOTE — ED Notes (Signed)
 Trauma Response Nurse Documentation   [name] is a 78 y.o. male arriving to [facility] ED via [facility].  On warfarin daily. Trauma was activated as a Level 2 by Ronal Oas RN based on the following trauma criteria Elderly patients > 65 with head trauma on anti-coagulation (excluding ASA).  Patient cleared for CT by Dr. Raford. Pt transported to CT with trauma response nurse present to monitor. RN remained with the patient throughout their absence from the department for clinical observation.   GCS 14.  Trauma MD Arrival Time: N/A.  History   Past Medical History:  Diagnosis Date   Allergy    Anemia    Anxiety    Arthritis    CAD (coronary artery disease) 1996   POST CABG   Cataracts, bilateral    Chicken pox    Dyslipidemia    Hyperlipidemia    Hypertension    Sinus bradycardia    Skin cancer 2018   scalp     Past Surgical History:  Procedure Laterality Date   BYPASS GRAFT  1996   CATARACT EXTRACTION Right    COLONOSCOPY WITH PROPOFOL  N/A 11/20/2022   Procedure: COLONOSCOPY WITH PROPOFOL ;  Surgeon: Albertus Gordy HERO, MD;  Location: [facility] ENDOSCOPY;  Service: Gastroenterology;  Laterality: N/A;   CORONARY ARTERY BYPASS GRAFT  6.1996   CABG x3 (SVGs, IMA)   EYE SURGERY  12.2017   HERNIA REPAIR  9.2003   Umbilical.   INGUINAL HERNIA REPAIR Right 04/09/2020   Procedure: LAPAROSCOPIC RIGHT INGUINAL HERNIA REPAIR WITH MESH;  Surgeon: Rubin Calamity, MD;  Location: [facility] OR;  Service: General;  Laterality: Right;   POLYPECTOMY  11/20/2022   Procedure: POLYPECTOMY;  Surgeon: Albertus Gordy HERO, MD;  Location: [facility] ENDOSCOPY;  Service: Gastroenterology;;   REFRACTIVE SURGERY Right    SKIN BIOPSY     head x 2   UMBILICAL HERNIA REPAIR  2003       Initial Focused Assessment (If applicable, or please see trauma documentation): Airway-- intact, no visible obstruction Breathing-- spontaneous, unlabored Circulation-- no obvious bleeding noted  CT's Completed:   CT Head and CT  C-Spine   Interventions:  See event summary  Plan for disposition:  Discharge home   Consults completed:  none at 0137.  Event Summary: Patient brought in by [facility] after an unwitnessed fall. Patient complaining of right shoulder pain. Patient with hx of dementia. On arrival, patient transferred from EMS stretcher to hospital stretcher. Manual BP obtained. 20 G PIV left forearm established. Trauma labs collected. Xray chest and pelvis completed. Patient to CT with TRN. CT head and c-spine completed. Patient back to exam room at this time.   MTP Summary (If applicable):  N/A  Bedside handoff with ED RN Delon.    Bernardino Mayotte  Trauma Response RN  Please call TRN at [phone] for further assistance.

## 2024-10-07 NOTE — Discharge Instructions (Signed)
 Your evaluation in the emergency department today was reassuring.  We recommend follow-up with your primary care doctor for recheck, as needed.  Return for new or concerning symptoms.

## 2024-10-07 NOTE — ED Provider Notes (Signed)
 [place] EMERGENCY DEPARTMENT AT [facility] Provider Note   CSN: [id number] Arrival date & time: 10/06/24  2352     Patient presents with: [name] is a 78 y.o. male.   79 y/o male with hx of CAD s/p CABG, HTN, HLD, hypercoagulable state (on Coumadin ), Alzheimer's dementia presents from home via EMS after an unwitnessed fall. Wife was at home, but was unaware of the fall. Patient found on the ground, unknown downtime. Patient c/o R shoulder pain. Denies headache, neck pain. Unable to provide causation for his fall tonight.  The history is provided by the patient. No language interpreter was used.  Fall       Prior to Admission medications   Medication Sig Start Date End Date Taking? Authorizing Provider  acetaminophen  (TYLENOL ) 325 MG tablet Take 650 mg by mouth every 6 (six) hours as needed.    [provider]  amLODipine  (NORVASC ) 5 MG tablet Take 1 tablet (5 mg total) by mouth daily. 06/13/24   Jordan, Betty G, MD  cholecalciferol (VITAMIN D ) 1000 units tablet Take 1,000 Units by mouth daily.     [provider]  Cyanocobalamin  (VITAMIN B12) 500 MCG TABS Take 500 mcg by mouth daily.     [provider]  divalproex  (DEPAKOTE  SPRINKLES) 125 MG capsule Take 2 capsules twice a day 05/31/24   Wertman, Sara E, PA-C  ezetimibe  (ZETIA ) 10 MG tablet TAKE 1 TABLET AT BEDTIME 07/01/24   Varanasi, Jayadeep S, MD  finasteride (PROSCAR) 5 MG tablet Take 5 mg by mouth daily. 06/12/23   [provider]  furosemide  (LASIX ) 40 MG tablet Take 1 tablet (40 mg total) by mouth as needed (for weight gain greater than 3 lbs in day or 5 lbs in week). 09/05/24   Kate Lonni CROME, MD  LORazepam  (ATIVAN ) 0.5 MG tablet TAKE 1/2 TABLET(0.25 MG) BY MOUTH TWICE DAILY AS NEEDED FOR ANXIETY 06/22/24   Jordan, Betty G, MD  memantine  (NAMENDA ) 10 MG tablet Take 1 tablet (10 mg total) by mouth 2 (two) times daily. 01/20/24   Wertman, Sara E, PA-C  MOVANTIK   25 MG TABS tablet TAKE 1 TABLET(25 MG) BY MOUTH DAILY 09/07/24   Pyrtle, Gordy HERO, MD  nitroGLYCERIN  (NITROSTAT ) 0.4 MG SL tablet Place 1 tablet (0.4 mg total) under the tongue every 5 (five) minutes as needed for chest pain (Up to 3 doses in one 24 hour period, if needing more, please go to Emergency Dept. to be evaluated). 03/02/24   Kate Lonni CROME, MD  ranolazine  (RANEXA ) 1000 MG SR tablet Take 1 tablet (1,000 mg total) by mouth 2 (two) times daily. 10/03/24   Kate Lonni CROME, MD  rosuvastatin  (CRESTOR ) 40 MG tablet TAKE 1 TABLET DAILY 06/30/24   Varanasi, Jayadeep S, MD  tamsulosin (FLOMAX) 0.4 MG CAPS capsule Take 0.4 mg by mouth daily after supper.    [provider]  traMADol  (ULTRAM ) 50 MG tablet Take 1 tablet (50 mg total) by mouth every 12 (twelve) hours as needed. 07/12/24   Jordan, Betty G, MD  warfarin (COUMADIN ) 5 MG tablet TAKE ONE-HALF (1/2) TO ONE TABLET DAILY AS DIRECTED BY COUMADIN  CLINIC 08/19/24   Kate Lonni CROME, MD    Allergies: Other, Imdur  [isosorbide  nitrate], and Latex    Review of Systems Ten systems reviewed and are negative for acute change, except as noted in the HPI.    Updated Vital Signs BP 122/60   Pulse (!) 56  Temp 98 F (36.7 C) (Oral)   Resp 14   SpO2 98%   Physical Exam Vitals and nursing note reviewed.  Constitutional:      General: He is not in acute distress.    Appearance: He is well-developed. He is not diaphoretic.     Comments: Elderly and frail.   HENT:     Head: Normocephalic and atraumatic.     Comments: No hematoma or contusion to scalp.  No Battle sign or raccoon's eyes. Eyes:     General: No scleral icterus.    Conjunctiva/sclera: Conjunctivae normal.  Neck:     Comments: No tenderness to the cervical midline.  No bony deformities, step-offs, crepitus. Cardiovascular:     Rate and Rhythm: Normal rate and regular rhythm.     Pulses: Normal pulses.  Pulmonary:     Effort: Pulmonary effort is normal.  No respiratory distress.     Comments: Respirations even and unlabored. Lungs CTAB. Abdominal:     General: There is no distension.     Palpations: Abdomen is soft.  Musculoskeletal:        General: Normal range of motion.     Cervical back: Normal range of motion.     Comments: Abrasion to the posterior right shoulder.  No obvious right shoulder deformity.  Exhibits passive range of motion of the right shoulder; no crepitus, swelling.  No clavicular deformity noted. Symmetric edema BLE.  Skin:    General: Skin is warm and dry.     Coloration: Skin is not pale.     Findings: No erythema or rash.  Neurological:     Mental Status: He is alert.     Comments: Alert and conversant. Pleasantly demented. Moving extremities spontaneously.  Psychiatric:        Behavior: Behavior normal.     (all labs ordered are listed, but only abnormal results are displayed) Labs Reviewed  CBC WITH DIFFERENTIAL/PLATELET - Abnormal; Notable for the following components:      Result Value   RBC 3.67 (*)    Hemoglobin 12.2 (*)    HCT 37.2 (*)    MCV 101.4 (*)    Platelets 143 (*)    All other components within normal limits  PROTIME-INR - Abnormal; Notable for the following components:   Prothrombin Time 20.1 (*)    INR 1.6 (*)    All other components within normal limits  I-STAT CHEM 8, ED - Abnormal; Notable for the following components:   BUN 24 (*)    Hemoglobin 12.2 (*)    HCT 36.0 (*)    All other components within normal limits    EKG: None   Radiology: CT HEAD WO CONTRAST ( ) Result Date: 10/07/2024 CLINICAL DATA:  Head trauma EXAM: CT HEAD WITHOUT CONTRAST CT CERVICAL SPINE WITHOUT CONTRAST TECHNIQUE: Multidetector CT imaging of the head and cervical spine was performed following the standard protocol without intravenous contrast. Multiplanar CT image reconstructions of the cervical spine were also generated. RADIATION DOSE REDUCTION: This exam was performed according to the  departmental dose-optimization program which includes automated exposure control, adjustment of the mA and/or kV according to patient size and/or use of iterative reconstruction technique. COMPARISON:  CT 05/29/2024 FINDINGS: CT HEAD FINDINGS Brain: No acute territorial infarction, hemorrhage or intracranial mass. Advanced atrophy. Mild chronic small vessel ischemic changes of the white matter. Nonenlarged ventricles Vascular: No hyperdense vessels.  No unexpected calcification Skull: Normal. Negative for fracture or focal lesion. Sinuses/Orbits: Mild mucosal thickening in the  ethmoid sinuses. Other: None CT CERVICAL SPINE FINDINGS Alignment: Straightening of the cervical spine. There is mild motion degradation. Trace anterolisthesis C4 on C5 and C5 on C6. Facet alignment is within normal limits Skull base and vertebrae: Vertebral body heights are maintained. No obvious fracture. Soft tissues and spinal canal: No prevertebral fluid or swelling. No visible canal hematoma. Disc levels: Multilevel degenerative change. Moderate advanced diffuse disc space narrowing and degenerative osteophytes. Multilevel facet degenerative changes with foraminal narrowing. Upper chest: Negative. Other: None IMPRESSION: No CT evidence for acute intracranial abnormality. Atrophy and chronic small vessel ischemic changes of the white matter. Mild motion degraded cervical spine CT. Straightening of the cervical spine with trace anterolisthesis C4 on C5 and C5 on C6, likely degenerative. No acute osseous abnormality. Electronically Signed   By: Luke Bun M.D.   On: 10/07/2024 00:27   CT Cervical Spine Wo Contrast Result Date: 10/07/2024 CLINICAL DATA:  Head trauma EXAM: CT HEAD WITHOUT CONTRAST CT CERVICAL SPINE WITHOUT CONTRAST TECHNIQUE: Multidetector CT imaging of the head and cervical spine was performed following the standard protocol without intravenous contrast. Multiplanar CT image reconstructions of the cervical spine were  also generated. RADIATION DOSE REDUCTION: This exam was performed according to the departmental dose-optimization program which includes automated exposure control, adjustment of the mA and/or kV according to patient size and/or use of iterative reconstruction technique. COMPARISON:  CT 05/29/2024 FINDINGS: CT HEAD FINDINGS Brain: No acute territorial infarction, hemorrhage or intracranial mass. Advanced atrophy. Mild chronic small vessel ischemic changes of the white matter. Nonenlarged ventricles Vascular: No hyperdense vessels.  No unexpected calcification Skull: Normal. Negative for fracture or focal lesion. Sinuses/Orbits: Mild mucosal thickening in the ethmoid sinuses. Other: None CT CERVICAL SPINE FINDINGS Alignment: Straightening of the cervical spine. There is mild motion degradation. Trace anterolisthesis C4 on C5 and C5 on C6. Facet alignment is within normal limits Skull base and vertebrae: Vertebral body heights are maintained. No obvious fracture. Soft tissues and spinal canal: No prevertebral fluid or swelling. No visible canal hematoma. Disc levels: Multilevel degenerative change. Moderate advanced diffuse disc space narrowing and degenerative osteophytes. Multilevel facet degenerative changes with foraminal narrowing. Upper chest: Negative. Other: None IMPRESSION: No CT evidence for acute intracranial abnormality. Atrophy and chronic small vessel ischemic changes of the white matter. Mild motion degraded cervical spine CT. Straightening of the cervical spine with trace anterolisthesis C4 on C5 and C5 on C6, likely degenerative. No acute osseous abnormality. Electronically Signed   By: Luke Bun M.D.   On: 10/07/2024 00:27   DG Pelvis Portable Result Date: 10/07/2024 CLINICAL DATA:  Fall EXAM: PORTABLE PELVIS 1-2 VIEWS COMPARISON:  CT 05/29/2024, 02/01/2024 FINDINGS: SI joints are non widened. Pubic symphysis and rami appear intact. No definitive fracture or malalignment IMPRESSION: No acute  osseous abnormality. Electronically Signed   By: Luke Bun M.D.   On: 10/07/2024 00:21   DG Shoulder Right Portable Result Date: 10/07/2024 CLINICAL DATA:  Fall EXAM: RIGHT SHOULDER - 1 VIEW COMPARISON:  None Available. FINDINGS: No fracture or malalignment.  Mild AC joint degenerative change. IMPRESSION: No acute osseous abnormality. Electronically Signed   By: Luke Bun M.D.   On: 10/07/2024 00:21   DG Chest Port 1 View Result Date: 10/07/2024 CLINICAL DATA:  Fall EXAM: PORTABLE CHEST 1 VIEW COMPARISON:  05/29/2024 FINDINGS: Limited by positioning, the patient's head and chin obscure the upper aspect of the lungs. Post sternotomy changes. Cardiomegaly with aortic atherosclerosis. IMPRESSION: Limited by positioning. Cardiomegaly. Electronically Signed  By: Luke Bun M.D.   On: 10/07/2024 00:17   NM PET CT CARDIAC PERFUSION MULTI W/ABSOLUTE BLOODFLOW Result Date: 10/05/2024   The study is normal. The study is low risk.   LV perfusion is normal. There is no evidence of ischemia. There is no evidence of infarction.   Overall image quality is average. Image quality affected due to patient motion and significant extracardiac activity.   Rest left ventricular function is normal. Rest EF: 51%. Stress left ventricular function is normal. Stress EF: 56%. End diastolic cavity size is mildly enlarged. End systolic cavity size is normal. No evidence of transient ischemic dilation (TID) noted.   Myocardial blood flow was computed to be 0.2ml/g/min at rest and 0.41ml/g/min at stress. Global myocardial blood flow reserve was 2.00 and was normal.   Coronary calcium  assessment not performed due to prior revascularization.   Electronically signed by: Soyla DELENA Merck, MD EXAM: OVER-READ INTERPRETATION CARDIAC CT CHEST The following report is an over-read performed by radiologist Dr. Ryan Salvage of [facility] on 10/05/2024. This over-read does not include interpretation of cardiac or  coronary anatomy or pathology. The cardiac and PET interpretation by the cardiologist is attached or will be attached. COMPARISON:  CT scan 05/29/2024 FINDINGS: Extracardiac Vascular: Ascending thoracic aortic aneurysm 4.1 cm in diameter. Aortic atherosclerosis. Mediastinum: Unremarkable Lung: Left lower lobe scarring. Included Upper Abdomen: Unremarkable Musculoskeletal: Prominent thoracic kyphosis. Prior median sternotomy. Thoracic spondylosis. IMPRESSION: 1. Thoracic spondylosis. 2.  Aortic Atherosclerosis (ICD10-I70.0). 3. Ascending thoracic aortic aneurysm 4.1 cm in diameter. Recommend annual imaging followup by CTA or MRA. This recommendation follows 2010 ACCF/AHA/AATS/ACR/ASA/SCA/SCAI/SIR/STS/SVM Guidelines for the Diagnosis and Management of Patients with Thoracic Aortic Disease. Circulation. 2010; 121: [id number] Electronically Signed   By: Ryan Salvage M.D.   On: 10/05/2024 16:02    Procedures   Medications Ordered in the ED - No data to display  Clinical Course as of 10/07/24 0150  Fri Oct 07, 2024  0015 No acute abnormality seen on right shoulder Xray. Pending radiology interpretation. [KH]  0036 CT head and C-spine as well as Xrays all negative for acute pathology. [KH]    Clinical Course User Index [KH] Keith Sor, PA-C                                 Medical Decision Making Amount and/or Complexity of Data Reviewed Labs: ordered. Radiology: ordered. ECG/medicine tests: ordered.   This patient presents to the ED for concern of unwitnessed fall, this involves an extensive number of treatment options, and is a complaint that carries with it a high risk of complications and morbidity.  The differential diagnosis includes mechanical fall vs syncope vs arrhythmia vs ICH    Co morbidities that complicate the patient evaluation  CAD HTN HLD Chronic anticoagulation   Additional history obtained:  Additional history obtained from EMS personnel External records from  outside source obtained and reviewed including prior discharge summaries   Lab Tests:  I Ordered, and personally interpreted labs.  The pertinent results include:  CBC and BMP c/w baseline.   Imaging Studies ordered:  I ordered imaging studies including CT head and C-spine, Xray R shoulder, chest and pelvis  I independently visualized and interpreted imaging which showed no acute or traumatic pathology I agree with the radiologist interpretation   Cardiac Monitoring:  The patient was maintained on a cardiac monitor.  I personally viewed and interpreted the cardiac monitored  which showed an underlying rhythm of: NSR   Medicines ordered and prescription drug management:  I have reviewed the patients home medicines and have made adjustments as needed   Test Considered:  CT shoulder - felt low yield given PROM on exam, negative Xray   Problem List / ED Course:  Patient presenting after an unwitnessed fall at home.  Found on the ground.  Noted to have an abrasion to the posterior right shoulder, but able to passively range the right upper extremity on examination.  Neurovascularly intact.  No outward evidence of head injury. CT head and cervical spine negative for skull fracture, ICH, other acute traumatic pathology.  X-rays are also reassuring.  Laboratory workup consistent with baseline. EKG not crossing over, but consistent with previous from February 2025.  No signs of acute ischemia or concerning arrhythmia. Plan for discharge and outpatient follow-up as needed.  Wife to transport patient back home.   Reevaluation:  After the interventions noted above, I reevaluated the patient and found that they have :stayed the same   Social Determinants of Health:  Good social support   Dispostion:  After consideration of the diagnostic results and the patients response to treatment, I feel that the patent would benefit from PCP f/u for recheck as needed. Return precautions  discussed and provided. Patient discharged in stable condition with no unaddressed concerns.       Final diagnoses:  Fall in home, initial encounter  Contusion of right shoulder, initial encounter    ED Discharge Orders     None          Keith Sor, PA-C 10/07/24 0155    Raford Lenis, MD 10/07/24 [phone]

## 2024-10-07 NOTE — Progress Notes (Signed)
   10/07/24 0018  Spiritual Encounters  Type of Visit Initial  Care provided to: Family  Reason for visit Urgent spiritual support  OnCall Visit Yes   Responded to trauma level 2. Remained with spouse, Arland while patient underwent CT. Spouse very concerned about change in patient's behavior due to advance dementia. Provided spiritual care via compassionate presence and reflective listening. Once patient returned, spouse wanted to focus on patient, took to be a polite way of terminating visit. Advised spouse if she wanted to continue conversation she could ask for the chaplain.

## 2024-10-11 ENCOUNTER — Emergency Department (HOSPITAL_COMMUNITY)

## 2024-10-11 ENCOUNTER — Emergency Department (HOSPITAL_COMMUNITY)
Admission: EM | Admit: 2024-10-11 | Discharge: 2024-10-12 | Disposition: A | Discharge status: Home / Self Care | Source: Skilled Nursing Facility | Attending: Emergency Medicine | Admitting: Emergency Medicine

## 2024-10-11 ENCOUNTER — Other Ambulatory Visit: Payer: Self-pay

## 2024-10-11 DIAGNOSIS — S0990XA Unspecified injury of head, initial encounter: Secondary | ICD-10-CM | POA: Diagnosis not present

## 2024-10-11 DIAGNOSIS — W19XXXA Unspecified fall, initial encounter: Secondary | ICD-10-CM | POA: Insufficient documentation

## 2024-10-11 DIAGNOSIS — F29 Unspecified psychosis not due to a substance or known physiological condition: Secondary | ICD-10-CM | POA: Diagnosis not present

## 2024-10-11 DIAGNOSIS — M47812 Spondylosis without myelopathy or radiculopathy, cervical region: Secondary | ICD-10-CM | POA: Diagnosis not present

## 2024-10-11 DIAGNOSIS — Z85828 Personal history of other malignant neoplasm of skin: Secondary | ICD-10-CM | POA: Diagnosis not present

## 2024-10-11 DIAGNOSIS — Y92129 Unspecified place in nursing home as the place of occurrence of the external cause: Secondary | ICD-10-CM | POA: Insufficient documentation

## 2024-10-11 DIAGNOSIS — I2510 Atherosclerotic heart disease of native coronary artery without angina pectoris: Secondary | ICD-10-CM | POA: Diagnosis not present

## 2024-10-11 DIAGNOSIS — I10 Essential (primary) hypertension: Secondary | ICD-10-CM | POA: Diagnosis not present

## 2024-10-11 DIAGNOSIS — G309 Alzheimer's disease, unspecified: Secondary | ICD-10-CM | POA: Insufficient documentation

## 2024-10-11 DIAGNOSIS — R519 Headache, unspecified: Secondary | ICD-10-CM | POA: Diagnosis not present

## 2024-10-11 DIAGNOSIS — Z91040 Latex allergy status: Secondary | ICD-10-CM | POA: Diagnosis not present

## 2024-10-11 DIAGNOSIS — Z79899 Other long term (current) drug therapy: Secondary | ICD-10-CM | POA: Insufficient documentation

## 2024-10-11 DIAGNOSIS — R404 Transient alteration of awareness: Secondary | ICD-10-CM | POA: Diagnosis not present

## 2024-10-11 DIAGNOSIS — M5030 Other cervical disc degeneration, unspecified cervical region: Secondary | ICD-10-CM | POA: Diagnosis not present

## 2024-10-11 DIAGNOSIS — Z85038 Personal history of other malignant neoplasm of large intestine: Secondary | ICD-10-CM | POA: Insufficient documentation

## 2024-10-11 DIAGNOSIS — Z7401 Bed confinement status: Secondary | ICD-10-CM | POA: Diagnosis not present

## 2024-10-11 DIAGNOSIS — Z955 Presence of coronary angioplasty implant and graft: Secondary | ICD-10-CM | POA: Insufficient documentation

## 2024-10-11 DIAGNOSIS — Z7901 Long term (current) use of anticoagulants: Secondary | ICD-10-CM | POA: Diagnosis not present

## 2024-10-11 DIAGNOSIS — R4182 Altered mental status, unspecified: Secondary | ICD-10-CM | POA: Diagnosis not present

## 2024-10-11 DIAGNOSIS — R5383 Other fatigue: Secondary | ICD-10-CM | POA: Diagnosis not present

## 2024-10-11 DIAGNOSIS — S199XXA Unspecified injury of neck, initial encounter: Secondary | ICD-10-CM | POA: Diagnosis not present

## 2024-10-11 DIAGNOSIS — I700 Atherosclerosis of aorta: Secondary | ICD-10-CM | POA: Diagnosis not present

## 2024-10-11 LAB — URINALYSIS, W/ REFLEX TO CULTURE (INFECTION SUSPECTED)
Bilirubin Urine: NEGATIVE
Glucose, UA: NEGATIVE mg/dL
Ketones, ur: NEGATIVE mg/dL
Leukocytes,Ua: NEGATIVE
Nitrite: NEGATIVE
Protein, ur: NEGATIVE mg/dL
RBC / HPF: >50 RBC/hpf (ref 0–5)
Specific Gravity, Urine: 1.011 (ref 1.005–1.030)
pH: 8 (ref 5.0–8.0)

## 2024-10-11 LAB — COMPREHENSIVE METABOLIC PANEL WITH GFR
ALT: 105 U/L — ABNORMAL HIGH (ref 0–44)
AST: 57 U/L — ABNORMAL HIGH (ref 15–41)
Albumin: 3.8 g/dL (ref 3.5–5.0)
Alkaline Phosphatase: 50 U/L (ref 38–126)
Anion gap: 10 (ref 5–15)
BUN: 18 mg/dL (ref 8–23)
CO2: 26 mmol/L (ref 22–32)
Calcium: 10.7 mg/dL — ABNORMAL HIGH (ref 8.9–10.3)
Chloride: 109 mmol/L (ref 98–111)
Creatinine, Ser: 1.15 mg/dL (ref 0.61–1.24)
GFR, Estimated: >60 mL/min (ref >60)
Glucose, Bld: 103 mg/dL — ABNORMAL HIGH (ref 70–99)
Potassium: 3.8 mmol/L (ref 3.5–5.1)
Sodium: 145 mmol/L (ref 135–145)
Total Bilirubin: 0.9 mg/dL (ref 0.0–1.2)
Total Protein: 6.5 g/dL (ref 6.5–8.1)

## 2024-10-11 LAB — CBC WITH DIFFERENTIAL/PLATELET
Abs Immature Granulocytes: 0.03 K/uL (ref 0.00–0.07)
Basophils Absolute: 0 K/uL (ref 0.0–0.1)
Basophils Relative: 1 %
Eosinophils Absolute: 0.1 K/uL (ref 0.0–0.5)
Eosinophils Relative: 2 %
HCT: 37 % — ABNORMAL LOW (ref 39.0–52.0)
Hemoglobin: 12.6 g/dL — ABNORMAL LOW (ref 13.0–17.0)
Immature Granulocytes: 1 %
Lymphocytes Relative: 13 %
Lymphs Abs: 0.7 K/uL (ref 0.7–4.0)
MCH: 33.3 pg (ref 26.0–34.0)
MCHC: 34.1 g/dL (ref 30.0–36.0)
MCV: 97.9 fL (ref 80.0–100.0)
Monocytes Absolute: 0.5 K/uL (ref 0.1–1.0)
Monocytes Relative: 10 %
Neutro Abs: 4 K/uL (ref 1.7–7.7)
Neutrophils Relative %: 73 %
Platelets: 159 K/uL (ref 150–400)
RBC: 3.78 MIL/uL — ABNORMAL LOW (ref 4.22–5.81)
RDW: 13.3 % (ref 11.5–15.5)
WBC: 5.4 K/uL (ref 4.0–10.5)
nRBC: 0 % (ref 0.0–0.2)

## 2024-10-11 LAB — PROTIME-INR
INR: 1.9 — ABNORMAL HIGH (ref 0.8–1.2)
Prothrombin Time: 23.1 s — ABNORMAL HIGH (ref 11.4–15.2)

## 2024-10-11 MED ORDER — LORAZEPAM 1 MG PO TABS
0.5000 mg | ORAL_TABLET | Freq: Once | ORAL | Status: DC
  Filled 2024-10-11: qty 1

## 2024-10-11 MED ORDER — LORAZEPAM 2 MG/ML IJ SOLN
1.0000 mg | Freq: Once | INTRAMUSCULAR | Status: AC
  Administered 2024-10-11: 1 mg via INTRAMUSCULAR
  Filled 2024-10-11: qty 1

## 2024-10-11 NOTE — ED Notes (Signed)
 PTAR has been scheduled for the patient to return to [place].  ETA: Within 30 minutes. RN made aware of ETA.
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
## 2024-10-11 NOTE — ED Triage Notes (Signed)
 Pt BIB EMS from [facility]. EMS reports pt had multiple falls, most recent witnessed mechanical fall and did not hit head. Bruising present to R shoulder on arrival. A&O to self only, hx dementia.    Pt takes warfarin. EDP at bedside on arrival.  EMS VS 148/80, HR 60, 92% RA.

## 2024-10-11 NOTE — ED Provider Notes (Signed)
 [place] EMERGENCY DEPARTMENT AT [facility] Provider Note  CSN: [id number] Arrival date & time: 10/11/24 9674  Chief Complaint(s) Fall  HPI [name] is a 78 y.o. male with a past medical history listed below including Alzheimer's dementia here after several unwitnessed falls and 1 witnessed fall at a nursing facility this evening.  EMS reported mechanical fall.  No witnessed head trauma on the last fall.  Patient does not remember falling.  Denies any pain.  He does have a right shoulder healing ecchymosis that was seen on prior.  ER visit several days ago. Patient is on Coumadin    Fall    Past Medical History Past Medical History:  Diagnosis Date   Allergy    Anemia    Anxiety    Arthritis    CAD (coronary artery disease) 1996   POST CABG   Cataracts, bilateral    Chicken pox    Dyslipidemia    Hyperlipidemia    Hypertension    Sinus bradycardia    Skin cancer 2018   scalp   Patient Active Problem List   Diagnosis Date Noted   Venous insufficiency (chronic) (peripheral) 01/13/2024   Declining functional status 11/18/2023   Opioid-induced constipation 10/21/2023   Frequent falls 10/21/2023   Other abnormal involuntary movements 10/21/2023   Normocytic anemia 10/21/2023   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) 02/22/2023   Sinus bradycardia 12/15/2022   Acute urinary retention 12/15/2022   History of adenomatous polyp of colon 11/20/2022   Benign neoplasm of transverse colon 11/20/2022   Bilateral lower extremity edema 10/24/2022   Venous ulcer of left leg (HCC) 10/24/2022   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) 11/05/2021   Hypertensive retinopathy of both eyes 02/21/2020   Former smoker 03/29/2019   Generalized osteoarthritis of multiple sites 09/20/2018   Unstable gait 09/20/2018   Varicose veins of both lower extremities 06/04/2018   Vitamin D  deficiency, unspecified 03/29/2018    Essential hypertension 10/22/2017   Hyperlipidemia 10/22/2017   Urinary hesitancy 02/10/2017   Primary hypercoagulable state (HCC) [D68.59] 02/09/2017   Encounter for therapeutic drug monitoring 02/09/2017   CAD (coronary artery disease) 02/09/2017   Anticoagulated 02/09/2017   Home Medication(s) Prior to Admission medications   Medication Sig Start Date End Date Taking? Authorizing Provider  acetaminophen  (TYLENOL ) 325 MG tablet Take 650 mg by mouth every 6 (six) hours as needed.    [provider]  amLODipine  (NORVASC ) 5 MG tablet Take 1 tablet (5 mg total) by mouth daily. 06/13/24   Jordan, Betty G, MD  cholecalciferol (VITAMIN D ) 1000 units tablet Take 1,000 Units by mouth daily.     [provider]  Cyanocobalamin  (VITAMIN B12) 500 MCG TABS Take 500 mcg by mouth daily.     [provider]  divalproex  (DEPAKOTE  SPRINKLES) 125 MG capsule Take 2 capsules twice a day 05/31/24   Wertman, Sara E, PA-C  ezetimibe  (ZETIA ) 10 MG tablet TAKE 1 TABLET AT BEDTIME 07/01/24   Varanasi, Jayadeep S, MD  finasteride (PROSCAR) 5 MG tablet Take 5 mg by mouth daily. 06/12/23   [provider]  furosemide  (LASIX ) 40 MG tablet Take 1 tablet (40 mg total) by mouth as needed (for weight gain greater than 3 lbs in day or 5 lbs in week). 09/05/24   Kate Lonni CROME, MD  LORazepam  (ATIVAN ) 0.5 MG tablet TAKE 1/2 TABLET(0.25 MG) BY MOUTH TWICE DAILY AS NEEDED FOR ANXIETY 06/22/24   Jordan, Betty  G, MD  memantine  (NAMENDA ) 10 MG tablet Take 1 tablet (10 mg total) by mouth 2 (two) times daily. 01/20/24   Wertman, Sara E, PA-C  MOVANTIK  25 MG TABS tablet TAKE 1 TABLET(25 MG) BY MOUTH DAILY 09/07/24   Pyrtle, Gordy HERO, MD  nitroGLYCERIN  (NITROSTAT ) 0.4 MG SL tablet Place 1 tablet (0.4 mg total) under the tongue every 5 (five) minutes as needed for chest pain (Up to 3 doses in one 24 hour period, if needing more, please go to Emergency Dept. to be evaluated). 03/02/24   Kate Lonni CROME, MD  ranolazine  (RANEXA ) 1000 MG SR tablet Take 1 tablet (1,000 mg total) by mouth 2 (two) times daily. 10/03/24   Kate Lonni CROME, MD  rosuvastatin  (CRESTOR ) 40 MG tablet TAKE 1 TABLET DAILY 06/30/24   Varanasi, Jayadeep S, MD  tamsulosin (FLOMAX) 0.4 MG CAPS capsule Take 0.4 mg by mouth daily after supper.    [provider]  traMADol  (ULTRAM ) 50 MG tablet Take 1 tablet (50 mg total) by mouth every 12 (twelve) hours as needed. 07/12/24   Jordan, Betty G, MD  warfarin (COUMADIN ) 5 MG tablet TAKE ONE-HALF (1/2) TO ONE TABLET DAILY AS DIRECTED BY COUMADIN  CLINIC 08/19/24   Kate Lonni CROME, MD                                                                                                                                    Allergies Other, Imdur  [isosorbide  nitrate], and Latex  Review of Systems Review of Systems As noted in HPI  Physical Exam Vital Signs  I have reviewed the triage vital signs BP (!) 155/78   Pulse (!) 50   Temp 98 F (36.7 C) (Oral)   Resp (!) 21   SpO2 96%   Physical Exam Constitutional:      General: He is not in acute distress.    Appearance: He is well-developed. He is not diaphoretic.  HENT:     Head: Normocephalic.     Right Ear: External ear normal.     Left Ear: External ear normal.  Eyes:     General: No scleral icterus.       Right eye: No discharge.        Left eye: No discharge.     Conjunctiva/sclera: Conjunctivae normal.     Pupils: Pupils are equal, round, and reactive to light.  Cardiovascular:     Rate and Rhythm: Regular rhythm.     Pulses:          Radial pulses are 2+ on the right side and 2+ on the left side.       Dorsalis pedis pulses are 2+ on the right side and 2+ on the left side.     Heart sounds: Normal heart sounds. No murmur heard.    No friction rub. No gallop.  Pulmonary:     Effort: Pulmonary effort is normal. No  respiratory distress.     Breath sounds: Normal breath sounds. No stridor.  Abdominal:      General: There is no distension.     Palpations: Abdomen is soft.     Tenderness: There is no abdominal tenderness.  Musculoskeletal:       Arms:     Cervical back: Normal range of motion and neck supple. No bony tenderness.     Thoracic back: No bony tenderness.     Lumbar back: No bony tenderness.     Right lower leg: Swelling present.     Left lower leg: Swelling present.     Comments: Clavicle stable. Chest stable to AP/Lat compression. Pelvis stable to Lat compression. No obvious extremity deformity. No chest or abdominal wall contusion.  Skin:    General: Skin is warm.  Neurological:     Mental Status: He is alert. Mental status is at baseline.     GCS: GCS eye subscore is 4. GCS verbal subscore is 5. GCS motor subscore is 6.     Comments: Moving all extremities      ED Results and Treatments Labs (all labs ordered are listed, but only abnormal results are displayed) Labs Reviewed  CBC WITH DIFFERENTIAL/PLATELET - Abnormal; Notable for the following components:      Result Value   RBC 3.78 (*)    Hemoglobin 12.6 (*)    HCT 37.0 (*)    All other components within normal limits  COMPREHENSIVE METABOLIC PANEL WITH GFR - Abnormal; Notable for the following components:   Glucose, Bld 103 (*)    Calcium  10.7 (*)    AST 57 (*)    ALT 105 (*)    All other components within normal limits  PROTIME-INR - Abnormal; Notable for the following components:   Prothrombin Time 23.1 (*)    INR 1.9 (*)    All other components within normal limits                                                                                                                         EKG  EKG Interpretation Date/Time:  Tuesday October 11 2024 03:37:18 EST Ventricular Rate:  51 PR Interval:  192 QRS Duration:  119 QT Interval:  454 QTC Calculation: 419 R Axis:   -36  Text Interpretation: Sinus rhythm Nonspecific IVCD with LAD Left ventricular hypertrophy Anterior Q waves, possibly due to  LVH No significant change since last tracing Confirmed by Trine Likes [phone]) on 10/11/2024 3:58:13 AM       Radiology CT Cervical Spine Wo Contrast Result Date: 10/11/2024 EXAM: CT CERVICAL SPINE WITHOUT CONTRAST 10/11/2024 04:46:34 AM TECHNIQUE: CT of the cervical spine was performed without the administration of intravenous contrast. Multiplanar reformatted images are provided for review. Automated exposure control, iterative reconstruction, and/or weight based adjustment of the mA/kV was utilized to reduce the radiation dose to as low as reasonably achievable. COMPARISON: Head CT reported separately 10/11/2024. Cervical spine CT 05/29/2024. CLINICAL HISTORY:  Neck trauma (Age >= 65y). FINDINGS: CERVICAL SPINE: BONES AND ALIGNMENT: No acute traumatic injury identified in the cervical spine. Stable mild straightening of cervical lordosis. Stable osteopenia. Subtle chronic cervical spondylolisthesis. DEGENERATIVE CHANGES: Widespread chronic cervical spine disc and endplate degeneration. Multilevel right side cervical facet arthropathy. SOFT TISSUES: No prevertebral soft tissue swelling. VASCULATURE: Aortic arch atherosclerosis in the visible upper chest. IMPRESSION: 1. No acute traumatic injury identified in the cervical spine. 2. Chronic cervical degeneration. 3. Aortic arch atherosclerosis. Electronically signed by: Helayne Hurst MD 10/11/2024 05:09 AM EST RP Workstation: [id number]   CT Head Wo Contrast Result Date: 10/11/2024 EXAM: CT HEAD WITHOUT CONTRAST 10/11/2024 04:46:34 AM TECHNIQUE: CT of the head was performed without the administration of intravenous contrast. Automated exposure control, iterative reconstruction, and/or weight based adjustment of the mA/kV was utilized to reduce the radiation dose to as low as reasonably achievable. COMPARISON: Head CT 10/07/2024. CLINICAL HISTORY: 78 year old male. Head trauma, intracranial venous injury suspected. FINDINGS: Mild generalized motion artifact  on today's exam. BRAIN AND VENTRICLES: No acute hemorrhage. No evidence of acute infarct. No hydrocephalus. No extra-axial collection. No mass effect or midline shift. Stable generalized cerebral volume loss which seems age advanced. Some disproportionate atrophy of the right temporal lobe is stable. Stable gray white differentiation with patchy mild to moderate gray white matter changes most commonly due to small vessel disease. ORBITS: No acute abnormality. SINUSES: Paranasal sinuses, middle ears and mastoids remain well aerated. SOFT TISSUES AND SKULL: No acute soft tissue abnormality. No skull fracture. IMPRESSION: 1. No acute intracranial abnormality or acute traumatic injury identified when allowing for motion artifact. Electronically signed by: Helayne Hurst MD 10/11/2024 05:02 AM EST RP Workstation: [id number]    Medications Ordered in ED Medications - No data to display Procedures Procedures  (including critical care time) Medical Decision Making / ED Course   Medical Decision Making Amount and/or Complexity of Data Reviewed Labs: ordered. Decision-making details documented in ED Course. Radiology: ordered and independent interpretation performed. Decision-making details documented in ED Course. ECG/medicine tests: ordered and independent interpretation performed. Decision-making details documented in ED Course.    Fall on thinners. No other obvious signs of trauma on exam other than the old right shoulder ecchymosis. Given the unwitnessed falls earlier today, we will get a CT head and cervical spine.  Will also get labs to assess for INR level, severe anemia, electrolyte derangements  CBC without leukocytosis.  Stable hemoglobin.  No electrolyte derangements or renal sufficiency.  Slightly subtherapeutic INR.  CT head and cervical spine without acute injury.     Final Clinical Impression(s) / ED Diagnoses Final diagnoses:  Fall, initial encounter  Chronic anticoagulation   The  patient appears reasonably screened and/or stabilized for discharge and I doubt any other medical condition or other [facility] requiring further screening, evaluation, or treatment in the ED at this time. I have discussed the findings, Dx and Tx plan with the patient/family who expressed understanding and agree(s) with the plan. Discharge instructions discussed at length. The patient/family was given strict return precautions who verbalized understanding of the instructions. No further questions at time of discharge.  Disposition: Discharge  Condition: Good  ED Discharge Orders     None        Follow Up: Jordan, Betty G, MD [address] [place] [address] [phone]  Call  to schedule an appointment for close follow up     This chart was dictated using voice recognition software.  Despite best efforts  to proofread,  errors can occur which can change the documentation meaning.    Trine Raynell Moder, MD 10/11/24 [phone]

## 2024-10-11 NOTE — ED Notes (Signed)
 Patient transported to CT

## 2024-10-12 DIAGNOSIS — R609 Edema, unspecified: Secondary | ICD-10-CM | POA: Diagnosis not present

## 2024-10-12 DIAGNOSIS — R451 Restlessness and agitation: Secondary | ICD-10-CM | POA: Diagnosis not present

## 2024-10-12 DIAGNOSIS — I10 Essential (primary) hypertension: Secondary | ICD-10-CM | POA: Diagnosis not present

## 2024-10-12 DIAGNOSIS — Z79899 Other long term (current) drug therapy: Secondary | ICD-10-CM | POA: Diagnosis not present

## 2024-10-12 DIAGNOSIS — E785 Hyperlipidemia, unspecified: Secondary | ICD-10-CM | POA: Diagnosis not present

## 2024-10-12 DIAGNOSIS — R463 Overactivity: Secondary | ICD-10-CM | POA: Diagnosis not present

## 2024-10-12 DIAGNOSIS — M6281 Muscle weakness (generalized): Secondary | ICD-10-CM | POA: Diagnosis not present

## 2024-10-12 DIAGNOSIS — R296 Repeated falls: Secondary | ICD-10-CM | POA: Diagnosis not present

## 2024-10-12 DIAGNOSIS — Z7901 Long term (current) use of anticoagulants: Secondary | ICD-10-CM | POA: Diagnosis not present

## 2024-10-12 DIAGNOSIS — M545 Low back pain, unspecified: Secondary | ICD-10-CM | POA: Diagnosis not present

## 2024-10-12 DIAGNOSIS — W1830XA Fall on same level, unspecified, initial encounter: Secondary | ICD-10-CM | POA: Diagnosis not present

## 2024-10-12 DIAGNOSIS — R2681 Unsteadiness on feet: Secondary | ICD-10-CM | POA: Diagnosis not present

## 2024-10-13 ENCOUNTER — Telehealth: Payer: Self-pay | Admitting: Cardiology

## 2024-10-13 NOTE — Telephone Encounter (Signed)
 Pt wife called in to speak with a nurse about pts upcoming appointments. Pt is in memory care and she needs some advice on how to handle his follow ups.

## 2024-10-13 NOTE — Telephone Encounter (Signed)
 Spoke with pt wife. Pt was admitted to memory care on Monday and unsure if she is able to get pt to his appointment next week with [facility]iology and Coumadin  Clinic. Appointment kept for now and wife is going to call back before Tuesday if patient is unable to get transport or make it to appointment.

## 2024-10-14 ENCOUNTER — Telehealth: Payer: Self-pay

## 2024-10-14 DIAGNOSIS — R0989 Other specified symptoms and signs involving the circulatory and respiratory systems: Secondary | ICD-10-CM | POA: Diagnosis not present

## 2024-10-14 DIAGNOSIS — M79604 Pain in right leg: Secondary | ICD-10-CM | POA: Diagnosis not present

## 2024-10-14 DIAGNOSIS — M79605 Pain in left leg: Secondary | ICD-10-CM | POA: Diagnosis not present
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
## 2024-10-15 ENCOUNTER — Emergency Department (HOSPITAL_COMMUNITY)

## 2024-10-15 ENCOUNTER — Emergency Department (HOSPITAL_COMMUNITY)
Admission: EM | Admit: 2024-10-15 | Discharge: 2024-10-15 | Disposition: A | Discharge status: Home / Self Care | Attending: Emergency Medicine | Admitting: Emergency Medicine

## 2024-10-15 ENCOUNTER — Other Ambulatory Visit: Payer: Self-pay

## 2024-10-15 DIAGNOSIS — Z85828 Personal history of other malignant neoplasm of skin: Secondary | ICD-10-CM | POA: Insufficient documentation

## 2024-10-15 DIAGNOSIS — R600 Localized edema: Secondary | ICD-10-CM | POA: Insufficient documentation

## 2024-10-15 DIAGNOSIS — Z91040 Latex allergy status: Secondary | ICD-10-CM | POA: Insufficient documentation

## 2024-10-15 DIAGNOSIS — I2510 Atherosclerotic heart disease of native coronary artery without angina pectoris: Secondary | ICD-10-CM | POA: Insufficient documentation

## 2024-10-15 DIAGNOSIS — R001 Bradycardia, unspecified: Secondary | ICD-10-CM | POA: Diagnosis not present

## 2024-10-15 DIAGNOSIS — Z7901 Long term (current) use of anticoagulants: Secondary | ICD-10-CM | POA: Diagnosis not present

## 2024-10-15 DIAGNOSIS — F0390 Unspecified dementia without behavioral disturbance: Secondary | ICD-10-CM | POA: Diagnosis not present

## 2024-10-15 DIAGNOSIS — I82409 Acute embolism and thrombosis of unspecified deep veins of unspecified lower extremity: Secondary | ICD-10-CM

## 2024-10-15 DIAGNOSIS — Z79899 Other long term (current) drug therapy: Secondary | ICD-10-CM | POA: Insufficient documentation

## 2024-10-15 DIAGNOSIS — I10 Essential (primary) hypertension: Secondary | ICD-10-CM | POA: Insufficient documentation

## 2024-10-15 DIAGNOSIS — R791 Abnormal coagulation profile: Secondary | ICD-10-CM | POA: Diagnosis not present

## 2024-10-15 DIAGNOSIS — G309 Alzheimer's disease, unspecified: Secondary | ICD-10-CM | POA: Diagnosis not present

## 2024-10-15 DIAGNOSIS — R609 Edema, unspecified: Secondary | ICD-10-CM | POA: Diagnosis not present

## 2024-10-15 DIAGNOSIS — R4182 Altered mental status, unspecified: Secondary | ICD-10-CM | POA: Diagnosis not present

## 2024-10-15 DIAGNOSIS — R410 Disorientation, unspecified: Secondary | ICD-10-CM | POA: Diagnosis not present

## 2024-10-15 DIAGNOSIS — Z7401 Bed confinement status: Secondary | ICD-10-CM | POA: Diagnosis not present

## 2024-10-15 LAB — CBC
HCT: 35.7 % — ABNORMAL LOW (ref 39.0–52.0)
Hemoglobin: 12 g/dL — ABNORMAL LOW (ref 13.0–17.0)
MCH: 33 pg (ref 26.0–34.0)
MCHC: 33.6 g/dL (ref 30.0–36.0)
MCV: 98.1 fL (ref 80.0–100.0)
Platelets: 175 K/uL (ref 150–400)
RBC: 3.64 MIL/uL — ABNORMAL LOW (ref 4.22–5.81)
RDW: 13.3 % (ref 11.5–15.5)
WBC: 5 K/uL (ref 4.0–10.5)
nRBC: 0 % (ref 0.0–0.2)

## 2024-10-15 LAB — BASIC METABOLIC PANEL WITH GFR
Anion gap: 12 (ref 5–15)
BUN: 33 mg/dL — ABNORMAL HIGH (ref 8–23)
CO2: 26 mmol/L (ref 22–32)
Calcium: 10.6 mg/dL — ABNORMAL HIGH (ref 8.9–10.3)
Chloride: 109 mmol/L (ref 98–111)
Creatinine, Ser: 1.01 mg/dL (ref 0.61–1.24)
GFR, Estimated: >60 mL/min (ref >60)
Glucose, Bld: 118 mg/dL — ABNORMAL HIGH (ref 70–99)
Potassium: 3.3 mmol/L — ABNORMAL LOW (ref 3.5–5.1)
Sodium: 147 mmol/L — ABNORMAL HIGH (ref 135–145)

## 2024-10-15 LAB — PROTIME-INR
INR: 1.4 — ABNORMAL HIGH (ref 0.8–1.2)
Prothrombin Time: 18.2 s — ABNORMAL HIGH (ref 11.4–15.2)

## 2024-10-15 MED ORDER — RIVAROXABAN (XARELTO) VTE STARTER PACK (15 & 20 MG): ORAL_TABLET | ORAL | 0 refills | Status: DC

## 2024-10-15 MED ORDER — LACTATED RINGERS IV BOLUS
500.0000 mL | Freq: Once | INTRAVENOUS | Status: AC
  Administered 2024-10-15: 500 mL via INTRAVENOUS

## 2024-10-15 NOTE — ED Notes (Signed)
 Pt can eat/drink.

## 2024-10-15 NOTE — ED Notes (Signed)
 PA made aware of bradycardia

## 2024-10-15 NOTE — ED Notes (Signed)
 First attempt to reach staff at facility, attempt failed.

## 2024-10-15 NOTE — ED Notes (Signed)
 Report given to staff at [facility].

## 2024-10-15 NOTE — Discharge Instructions (Signed)
 As we discussed, your workup in the ER today was reassuring for acute findings.  We did not see any blood clots in the vessels of your leg.  However your INR is low and it seems like you have been having issues getting your INR checked and therefore I think it would be reasonable to switch you to Xarelto which is a different anticoagulation medication that does not require laboratory monitoring.  I have written you a prescription for this, please take as prescribed every day.  Please discontinue taking the warfarin.  Follow-up with your PCP for additional refills of this medication.  Return if development of any new or worsening symptoms

## 2024-10-15 NOTE — ED Notes (Signed)
 Vascular tech at bedside.

## 2024-10-15 NOTE — Progress Notes (Signed)
 VASCULAR LAB    Bilateral lower extremity venous duplex has been performed.  See CV proc for preliminary results.   Nihar Klus, RVT 10/15/2024, 6:00 PM

## 2024-10-15 NOTE — ED Notes (Signed)
 Attempted to change pharmacy to [facility].

## 2024-10-15 NOTE — ED Triage Notes (Signed)
 Pt arrives from [facility], via ems. Per ems labs were called to nursing home - was told he has a blood clot.   140 palp 64 HR 98 room air  Hx  Dementia CAD

## 2024-10-15 NOTE — ED Provider Notes (Signed)
 [place] EMERGENCY DEPARTMENT AT [facility] Provider Note   CSN: [id number] Arrival date & time: 10/15/24  1441     Patient presents with: BLOOD CLOT   DOCTOR SHEAHAN is a 78 y.o. male.   Patient with history of CAD, hyperlipidemia, hypertension, Alzheimer's dementia presents from [facility] memory care for concern of DVT. Patient with advanced dementia, history provided by patients wife over the phone. Reports the facility told her that he had a clot in his right thigh and sent him for evaluation of same. He is on Coumadin , however the patients wife reports that he has been refusing to take medications recently.  She did recently sign DNR paperwork for the patient and is in the process of setting up hospice care. Patients wife reports that his legs have been swollen for some time and the right is always more swollen than the left. Reports no change in this recently. Discussed with patients facility who reports the DVT was seen on ultrasound at the facility and he was sent here for evaluation. The facility reports that he has been taking his Coumadin  as prescribed.   Level 5 caveat -- dementia  The history is provided by the patient, the spouse and the nursing home. No language interpreter was used.       Prior to Admission medications   Medication Sig Start Date End Date Taking? Authorizing Provider  acetaminophen  (TYLENOL ) 325 MG tablet Take 650 mg by mouth every 6 (six) hours as needed.    [provider]  amLODipine  (NORVASC ) 5 MG tablet Take 1 tablet (5 mg total) by mouth daily. 06/13/24   Jordan, Betty G, MD  cholecalciferol (VITAMIN D ) 1000 units tablet Take 1,000 Units by mouth daily.     [provider]  Cyanocobalamin  (VITAMIN B12) 500 MCG TABS Take 500 mcg by mouth daily.     [provider]  divalproex  (DEPAKOTE  SPRINKLES) 125 MG capsule Take 2 capsules twice a day 05/31/24   Wertman, Sara E, PA-C  ezetimibe  (ZETIA ) 10 MG tablet TAKE  1 TABLET AT BEDTIME 07/01/24   Varanasi, Jayadeep S, MD  finasteride (PROSCAR) 5 MG tablet Take 5 mg by mouth daily. 06/12/23   [provider]  furosemide  (LASIX ) 40 MG tablet Take 1 tablet (40 mg total) by mouth as needed (for weight gain greater than 3 lbs in day or 5 lbs in week). 09/05/24   Kate Lonni CROME, MD  LORazepam  (ATIVAN ) 0.5 MG tablet TAKE 1/2 TABLET(0.25 MG) BY MOUTH TWICE DAILY AS NEEDED FOR ANXIETY 06/22/24   Jordan, Betty G, MD  memantine  (NAMENDA ) 10 MG tablet Take 1 tablet (10 mg total) by mouth 2 (two) times daily. 01/20/24   Wertman, Sara E, PA-C  MOVANTIK  25 MG TABS tablet TAKE 1 TABLET(25 MG) BY MOUTH DAILY 09/07/24   Pyrtle, Gordy HERO, MD  nitroGLYCERIN  (NITROSTAT ) 0.4 MG SL tablet Place 1 tablet (0.4 mg total) under the tongue every 5 (five) minutes as needed for chest pain (Up to 3 doses in one 24 hour period, if needing more, please go to Emergency Dept. to be evaluated). 03/02/24   Kate Lonni CROME, MD  ranolazine  (RANEXA ) 1000 MG SR tablet Take 1 tablet (1,000 mg total) by mouth 2 (two) times daily. 10/03/24   Kate Lonni CROME, MD  rosuvastatin  (CRESTOR ) 40 MG tablet TAKE 1 TABLET DAILY 06/30/24   Varanasi, Jayadeep S, MD  tamsulosin (FLOMAX) 0.4 MG CAPS capsule Take 0.4 mg by mouth daily after supper.  [provider]  traMADol  (ULTRAM ) 50 MG tablet Take 1 tablet (50 mg total) by mouth every 12 (twelve) hours as needed. 07/12/24   Jordan, Betty G, MD  warfarin (COUMADIN ) 5 MG tablet TAKE ONE-HALF (1/2) TO ONE TABLET DAILY AS DIRECTED BY COUMADIN  CLINIC 08/19/24   Kate Lonni CROME, MD    Allergies: Other, Imdur  [isosorbide  nitrate], and Latex    Review of Systems  Unable to perform ROS: Dementia  Cardiovascular:  Positive for leg swelling.    Updated Vital Signs BP 114/77 (BP Location: Left Arm)   Pulse (!) 59   Temp (!) 97.5 F (36.4 C) (Oral)   Resp 16   SpO2 100%   Physical Exam Vitals and nursing note reviewed.   Constitutional:      General: He is not in acute distress.    Appearance: Normal appearance. He is normal weight. He is not toxic-appearing or diaphoretic.     Comments: Chronically ill appearing  HENT:     Head: Normocephalic and atraumatic.  Cardiovascular:     Rate and Rhythm: Normal rate.  Pulmonary:     Effort: Pulmonary effort is normal. No respiratory distress.  Musculoskeletal:        General: Normal range of motion.     Cervical back: Normal range of motion.     Comments: BLE edema, worse on right as opposed to left. 3+ on the right, no erythema or warmth. DP and PT pulses palpable with doppler. No palpable cord or skin changes. No obvious signs of pain with palpation  Skin:    General: Skin is warm and dry.  Neurological:     General: No focal deficit present.     Mental Status: He is alert.  Psychiatric:        Mood and Affect: Mood normal.        Behavior: Behavior normal.     (all labs ordered are listed, but only abnormal results are displayed) Labs Reviewed  CBC - Abnormal; Notable for the following components:      Result Value   RBC 3.64 (*)    Hemoglobin 12.0 (*)    HCT 35.7 (*)    All other components within normal limits  BASIC METABOLIC PANEL WITH GFR - Abnormal; Notable for the following components:   Sodium 147 (*)    Potassium 3.3 (*)    Glucose, Bld 118 (*)    BUN 33 (*)    Calcium  10.6 (*)    All other components within normal limits  PROTIME-INR - Abnormal; Notable for the following components:   Prothrombin Time 18.2 (*)    INR 1.4 (*)    All other components within normal limits    EKG: None  Radiology: VAS US  LOWER EXTREMITY VENOUS (DVT) (7a-7p) Result Date: 10/15/2024  Lower Venous DVT Study Patient Name:  KAUSHIK MAUL  Date of Exam:   10/15/2024 Medical Rec #: [id number]          Accession #:    [id number] Date of Birth: 07/28/1946         Patient Gender: M Patient Age:   29 years Exam Location:  [facility] Procedure:       VAS US  LOWER EXTREMITY VENOUS (DVT) Referring Phys: LAURAINE Hilda Wexler --------------------------------------------------------------------------------  Indications: Possible DVT found on study done at nursing facility yesterday. Sent to ED to verify.  Risk Factors: Venous reflux disease. Limitations: Poor cooperation secondary to advanced dementia, bradycardia, respirations 10-15 throughout exam, unable to lie  flat, feet hanging over end of bed. Comparison Study: No prior study on file. Performing Technologist: Alberta Lis RVS  Examination Guidelines: A complete evaluation includes B-mode imaging, spectral Doppler, color Doppler, and power Doppler as needed of all accessible portions of each vessel. Bilateral testing is considered an integral part of a complete examination. Limited examinations for reoccurring indications may be performed as noted. The reflux portion of the exam is performed with the patient in reverse Trendelenburg.  +---------+---------------+---------+-----------+----------+-------------------+ RIGHT    CompressibilityPhasicitySpontaneityPropertiesThrombus Aging      +---------+---------------+---------+-----------+----------+-------------------+ CFV      Full           Yes      Yes                                      +---------+---------------+---------+-----------+----------+-------------------+ SFJ      Full                                                             +---------+---------------+---------+-----------+----------+-------------------+ FV Prox  Full                                                             +---------+---------------+---------+-----------+----------+-------------------+ FV Mid   Full                                                             +---------+---------------+---------+-----------+----------+-------------------+ FV DistalFull                                                              +---------+---------------+---------+-----------+----------+-------------------+ PFV      Full                                                             +---------+---------------+---------+-----------+----------+-------------------+ POP                     Yes      Yes                  patent by color and                                                       Doppler             +---------+---------------+---------+-----------+----------+-------------------+  PTV      Full                                                             +---------+---------------+---------+-----------+----------+-------------------+ PERO                                                  patent by color     +---------+---------------+---------+-----------+----------+-------------------+   +---------+---------------+---------+-----------+----------+-------------------+ LEFT     CompressibilityPhasicitySpontaneityPropertiesThrombus Aging      +---------+---------------+---------+-----------+----------+-------------------+ CFV      Full           Yes      Yes                                      +---------+---------------+---------+-----------+----------+-------------------+ SFJ      Full                                                             +---------+---------------+---------+-----------+----------+-------------------+ FV Prox  Full                                                             +---------+---------------+---------+-----------+----------+-------------------+ FV Mid   Full           Yes      Yes                                      +---------+---------------+---------+-----------+----------+-------------------+ FV DistalFull           Yes      Yes                                      +---------+---------------+---------+-----------+----------+-------------------+ PFV      Full                                                              +---------+---------------+---------+-----------+----------+-------------------+ POP                     Yes      Yes                  patent by color and  Doppler             +---------+---------------+---------+-----------+----------+-------------------+ PTV                                                   patent by color     +---------+---------------+---------+-----------+----------+-------------------+ PERO                                                  Not well visualized +---------+---------------+---------+-----------+----------+-------------------+     Summary: RIGHT: - There is no evidence of deep vein thrombosis in the lower extremity.  LEFT: - There is no evidence of deep vein thrombosis in the lower extremity.  - Valves noted at sites of concern in the mid to distal femoral vein and at the [facility] with Rouleaux flow at the valves.  *See table(s) above for measurements and observations.    Preliminary      Procedures   Medications Ordered in the ED - No data to display                                  Medical Decision Making Amount and/or Complexity of Data Reviewed Labs: ordered.   This patient is a 78 y.o. male who presents to the ED for concern of RLE DVT seen on outpatient imaging, this involves an extensive number of treatment options, and is a complaint that carries with it a high risk of complications and morbidity. The emergent differential diagnosis prior to evaluation includes, but is not limited to,  DVT, cellulitis . This is not an exhaustive differential.   Past Medical History / Co-morbidities / Social History:  has a past medical history of Allergy, Anemia, Anxiety, Arthritis, CAD (coronary artery disease) (1996), Cataracts, bilateral, Chicken pox, Dyslipidemia, Hyperlipidemia, Hypertension, Sinus bradycardia, and Skin cancer (2018).  Patient with advanced Alzheimer's dementia,  all history provided by patients wife at bedside and facility over the phone  Additional history: Chart reviewed. Pertinent results include: Follows with hematology Dr. 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 Last saw hematology in May 2025, looks like there was a discussion about switching to a DOAC, however decided to stay with Coumadin    Physical Exam: Physical exam performed. The pertinent findings include: chronically ill appearing, BLE edema, worse on right as opposed to left. 3+ on the right, no erythema or warmth. DP and PT pulses palpable with doppler. No palpable cord or skin changes. No obvious signs of pain with palpation  Lab Tests: I ordered, and personally interpreted labs.  The pertinent results include:  Hgb 12.0 (consistent with previous), Na 147, K 3.3, glucose 118, BUN 33, calcium  10.6. INR 1.4 (patients goal is reportedly 2.0-2.5)   Imaging Studies: I ordered imaging studies including DVT US . I independently visualized and interpreted imaging which showed   RIGHT:   There is no evidence of deep vein thrombosis in the lower extremity.    LEFT:   There is no evidence of deep vein thrombosis in the lower extremity.    Valves noted at sites of concern  in the mid to distal femoral vein and  at the [facility] with Rouleaux flow at the valves.   I agree with the radiologist interpretation.   Cardiac Monitoring:  The patient was maintained on a cardiac monitor.  My attending physician Dr. Lenor viewed and interpreted the cardiac monitored which showed an underlying rhythm of: sinus bradycardia, no STEMI. I agree with this interpretation.   Medications: I ordered medication including IV fluids  for dehydration. Reevaluation of the patient after these medicines showed that the patient improved. I have reviewed the patients home medicines and have made adjustments as  needed.  Consultations Obtained: I requested consultation with the heme/onc on call Dr. Federico,  and discussed lab and imaging findings as well as pertinent plan - they recommend: Dr. Federico reports that patient should be fine to bridge to Xarelto and discontinue Warfarin, recommends 15 mg BID x 21 days and then 20 mg daily afterwards   Disposition: After consideration of the diagnostic results and the patients response to treatment, I feel that emergency department workup does not suggest an emergent condition requiring admission or immediate intervention beyond what has been performed at this time. The plan is: discharge with close outpatient follow-up and return precautions. Patients DVT study is negative here, he has no changes in his BLE edema according to wife at bedside.  There is no signs of cellulitis.  He is quite subtherapeutic with his INR and wife reports that getting his INR checked has been difficult given his cognitive decline.  She does request that he remain on anticoagulation and therefore feel that maintaining therapeutic levels would be best achieved with switching to a DOAC.  Discussed with hematology who agrees.  I was planning to give him initial dose this evening, however the patient's wife is concerned that since he gets his prescriptions through his memory care unit that they will not be available until Monday and so request that this bridge not start until Monday. Evaluation and diagnostic testing in the emergency department does not suggest an emergent condition requiring admission or immediate intervention beyond what has been performed at this time.  Plan for discharge with close PCP follow-up.  Patient is understanding and amenable with plan, educated on red flag symptoms that would prompt immediate return.  Patient discharged in stable condition.   I discussed this case with my attending physician Dr. Lenor who cosigned this note including patient's presenting symptoms,  physical exam, and planned diagnostics and interventions. Attending physician stated agreement with plan or made changes to plan which were implemented.    Final diagnoses:  Subtherapeutic international normalized ratio (INR)    ED Discharge Orders          Ordered    RIVAROXABAN (XARELTO) VTE STARTER PACK (15 & 20 MG)        10/15/24 2050          An After Visit Summary was printed and given to the patient.     Nora Lauraine DELENA DEVONNA 10/15/24 2051    Lenor Hollering, MD 10/15/24 2108

## 2024-10-16 NOTE — Progress Notes (Deleted)
 Cardiology Office Note:    Date:  10/16/2024   ID:  [name], DOB September 24, 1946, MRN [id number]  PCP:  Jordan, Betty G, MD  Cardiologist:  Lonni LITTIE Nanas, MD  Electrophysiologist:  None   Referring MD: Jordan, Betty G, MD   No chief complaint on file.   History of Present Illness:    [name] is a 78 y.o. male with a hx of CAD status post CABG in 1996, hypertension, hyperlipidemia who presents for follow-up.  Previously followed with Dr. Dann.  He had 2 ED visits for chest pain in August 2024.  Stress test or heart catheterization was discussed but he stated he was unable to lay flat on table and declined any imaging or procedures.  He was started on Imdur  30 mg daily and Ranexa  500 mg twice daily.  Reports discontinued Imdur  due to headaches.  States that chest pain has improved, denies any recent chest pain.  Denies any dyspnea, lightheadedness, syncope,or palpitations.  Has chronic swelling in legs which reports from lymphedema.  Has blister in legs and is following in wound clinic.  Denies any bleeding on warfarin.  Echocardiogram 03/2024 showed EF 60 to 65%, normal RV function, moderate left atrial enlargement, no significant valvular disease, dilated aortic root measuring 44 mm.  Zio patch x 7 days 02/2024 showed no significant arrhythmias, low resting heart rate (average 58 bpm).  Stress PET 10/05/2024 showed normal perfusion, LVEF 51%, normal myocardial blood flow reserve, low risk study.  Since last clinic visit,  he reports he has been having chest pain.  Sometimes on left side of chest but sometimes right.  Lasts about a minute.  Describes both sharp pain and burning.  No clear cause.   Wt Readings from Last 3 Encounters:  07/13/24 164 lb (74.4 kg)  05/29/24 171 lb 15.3 oz (78 kg)  04/07/24 151 lb 6.4 oz (68.7 kg)     Past Medical History:  Diagnosis Date   Allergy    Anemia    Anxiety    Arthritis    CAD (coronary artery disease) 1996   POST  CABG   Cataracts, bilateral    Chicken pox    Dyslipidemia    Hyperlipidemia    Hypertension    Sinus bradycardia    Skin cancer 2018   scalp    Past Surgical History:  Procedure Laterality Date   BYPASS GRAFT  1996   CATARACT EXTRACTION Right    COLONOSCOPY WITH PROPOFOL  N/A 11/20/2022   Procedure: COLONOSCOPY WITH PROPOFOL ;  Surgeon: Albertus Gordy HERO, MD;  Location: [facility] ENDOSCOPY;  Service: Gastroenterology;  Laterality: N/A;   CORONARY ARTERY BYPASS GRAFT  6.1996   CABG x3 (SVGs, IMA)   EYE SURGERY  12.2017   HERNIA REPAIR  9.2003   Umbilical.   INGUINAL HERNIA REPAIR Right 04/09/2020   Procedure: LAPAROSCOPIC RIGHT INGUINAL HERNIA REPAIR WITH MESH;  Surgeon: Rubin Calamity, MD;  Location: [facility] OR;  Service: General;  Laterality: Right;   POLYPECTOMY  11/20/2022   Procedure: POLYPECTOMY;  Surgeon: Albertus Gordy HERO, MD;  Location: [facility] ENDOSCOPY;  Service: Gastroenterology;;   REFRACTIVE SURGERY Right    SKIN BIOPSY     head x 2   UMBILICAL HERNIA REPAIR  2003    Current Medications: No outpatient medications have been marked as taking for the 10/18/24 encounter (Appointment) with Nanas Lonni LITTIE, MD.     Allergies:   Other, Imdur  [isosorbide  nitrate], and Latex   Social History  Socioeconomic History   Marital status: Married    Spouse name: [name]   Number of children: 2   Years of education: COLLEGE   Highest education level: Bachelor's degree (e.g., BA, AB, BS)  Occupational History   Occupation: RETIRED  Tobacco Use   Smoking status: Former    Current packs/day: 0.00    Types: Cigarettes, Pipe    Quit date: 05/29/1982    Years since quitting: 42.4   Smokeless tobacco: Never  Vaping Use   Vaping status: Never Used  Substance and Sexual Activity   Alcohol use: Not Currently    Comment: 0-2 drinks a night   Drug use: Never   Sexual activity: Not Currently    Partners: Female    Comment: married  Other Topics Concern   Not on file  Social History  Narrative   Right handed   Caffeine   One story home lives with wife   retired   Chief Executive Officer Drivers of Corporate Investment Banker Strain: Low Risk  (01/08/2024)   Overall Financial Resource Strain (CARDIA)    Difficulty of Paying Living Expenses: Not hard at all  Food Insecurity: No Food Insecurity (01/08/2024)   Hunger Vital Sign    Worried About Running Out of Food in the Last Year: Never true    Ran Out of Food in the Last Year: Never true  Transportation Needs: No Transportation Needs (01/08/2024)   PRAPARE - Administrator, Civil Service (Medical): No    Lack of Transportation (Non-Medical): No  Physical Activity: Inactive (01/08/2024)   Exercise Vital Sign    Days of Exercise per Week: 0 days    Minutes of Exercise per Session: 0 min  Stress: No Stress Concern Present (01/08/2024)   [employer] of Occupational Health - Occupational Stress Questionnaire    Feeling of Stress : Not at all  Recent Concern: Stress - Stress Concern Present (12/19/2023)   [employer] of Occupational Health - Occupational Stress Questionnaire    Feeling of Stress : To some extent  Social Connections: Moderately Isolated (01/08/2024)   Social Connection and Isolation Panel    Frequency of Communication with Friends and Family: More than three times a week    Frequency of Social Gatherings with Friends and Family: Once a week    Attends Religious Services: Never    Database Administrator or Organizations: No    Attends Engineer, Structural: Never    Marital Status: Married     Family History: The patient's family history includes AAA (abdominal aortic aneurysm) in his sister; Alzheimer's disease in his mother; Cancer in his father; Colon cancer in an other family member; Hypertension in his maternal grandmother and paternal grandmother; Prostate cancer in his father. There is no history of Esophageal cancer, Pancreatic cancer, Liver disease, or Stomach cancer.  ROS:    Please see the history of present illness.     All other systems reviewed and are negative.  EKGs/Labs/Other Studies Reviewed:    The following studies were reviewed today:   EKG:  EKG is not ordered today.    Recent Labs: 05/29/2024: Magnesium 2.0 07/13/2024: BNP 113.6 10/11/2024: ALT 105 10/15/2024: BUN 33; Creatinine, Ser 1.01; Hemoglobin 12.0; Platelets 175; Potassium 3.3; Sodium 147  Recent Lipid Panel    Component Value Date/Time   CHOL 104 02/26/2024 1459   TRIG 36 02/26/2024 1459   HDL 48 02/26/2024 1459   CHOLHDL 2.2 02/26/2024 1459   CHOLHDL 2 11/05/2021  0845   VLDL 9.4 11/05/2021 0845   LDLCALC 46 02/26/2024 1459    Physical Exam:    VS:  There were no vitals taken for this visit.    Wt Readings from Last 3 Encounters:  07/13/24 164 lb (74.4 kg)  05/29/24 171 lb 15.3 oz (78 kg)  04/07/24 151 lb 6.4 oz (68.7 kg)     GEN: Chronically ill-appearing HEENT: Normal NECK: No JVD; No carotid bruits CARDIAC: RRR, no murmurs, rubs, gallops RESPIRATORY:  Clear to auscultation without rales, wheezing or rhonchi  ABDOMEN: Soft, non-tender, non-distended MUSCULOSKELETAL: 2+ bilateral edema; No deformity  SKIN: Warm and dry NEUROLOGIC:  Alert and oriented x 3 PSYCHIATRIC:  Normal affect   ASSESSMENT:    No diagnosis found.   PLAN:    CAD:  status post CABG in 1996, hypertension, hyperlipidemia who presents for follow-up.  Previously followed with Dr. Dann.  He had 2 ED visits for chest pain in August 2024.  Stress test or heart catheterization was discussed but he stated he was unable to lay flat on table and declined any imaging or procedures.  He was started on Imdur  30 mg daily and Ranexa  500 mg twice daily but did not tolerate Imdur  due to headaches.  Stress PET 10/05/2024 showed normal perfusion, LVEF 51%, normal myocardial blood flow reserve, low risk study. -Continue warfarin -Continue rosuvastatin , Zetia  -Continue Ranexa   Bradycardia: Wife has noted  heart rate down to 40s.   Zio patch x 7 days 02/2024 showed no significant arrhythmias, low resting heart rate (average 58 bpm)  Hypertension: On amlodipine  5 mg daily.  Appears controlled  Lower extremity edema: Has been on Lasix  40 mg every other day.  Does have significant lower extremity edema on exam but also with history of lymphedema.  BNP mildly elevated 02/2024 (191).  Echocardiogram 03/2024 showed EF 60 to 65%, normal RV function, moderate left atrial enlargement, no significant valvular disease, dilated aortic root measuring 44 mm.   - Worsening renal function since he was started on Lasix .  Suspect his edema is due to his lymphedema and Lasix  is not effective for this.  Recommend holding Lasix  for now.    Hyperlipidemia: On rosuvastatin  40 mg daily, Zetia  10 mg daily.  LDL 46 on 02/26/2024  Hypercoagulable disorder: On Coumadin  for reported history of hypercoagulable disorder.  Unclear diagnosis, he does not have any history of PE or DVT but reports family history.  States that he was diagnosed with hypercoagulable disorder at [facility] in 1990s.  Would recommend establishing with hematology to see if does indeed have hypercoagulable disorder and whether anticoagulation recommended.  If anticoagulation needed, would recommend discussing with hematology if can switch from Coumadin  to DOAC - He was seen by hematology 04/07/2024.  Per note he has elevated fibrinopeptide A and mild resistance to activated protein C.  Recommended continuing anticoagulation.  Discussed switching to DOACs but patient preference was to remain on Coumadin   RTC in 3 months***   Medication Adjustments/Labs and Tests Ordered: Current medicines are reviewed at length with the patient today.  Concerns regarding medicines are outlined above.  No orders of the defined types were placed in this encounter.  No orders of the defined types were placed in this encounter.   There are no Patient Instructions on file for this visit.    Signed, Lonni LITTIE Nanas, MD  10/16/2024 9:20 PM    [facility]

## 2024-10-17 ENCOUNTER — Telehealth: Payer: Self-pay | Admitting: Cardiology

## 2024-10-17 ENCOUNTER — Telehealth: Payer: Self-pay | Admitting: *Deleted

## 2024-10-17 NOTE — Telephone Encounter (Signed)
 Wife Lessie) stated patient is being placed in memory care and wants a call back to discuss care plan going forward.

## 2024-10-17 NOTE — Telephone Encounter (Signed)
 Wife called to report that the patient is being switched to Xarelto per hospital ER and that he has been placed in memory care. Since he will not be on warfarin any longer will remove appointment and med off med list. Please refer to ER not for details on stopping warfarin.

## 2024-10-17 NOTE — Telephone Encounter (Signed)
 Called [name], patient's wife (DPR) back about message. Patient was placed in a memory care unit last week. [name] would like Dr. Kate to call her back. She just wants to know what would be the best for her husband and his care. Asked if she was looking at comfort care or something like palliative care, and she said yes. She has talked it over with his PCP, but just needs the input from his cardiologist. She is wanting to make the best decisions for her husband. Will forward to Dr. Kate.

## 2024-10-18 ENCOUNTER — Ambulatory Visit: Admitting: Cardiology

## 2024-10-18 ENCOUNTER — Ambulatory Visit

## 2024-10-19 DIAGNOSIS — R2681 Unsteadiness on feet: Secondary | ICD-10-CM | POA: Diagnosis not present

## 2024-10-19 DIAGNOSIS — Z7901 Long term (current) use of anticoagulants: Secondary | ICD-10-CM | POA: Diagnosis not present

## 2024-10-19 DIAGNOSIS — Z79899 Other long term (current) drug therapy: Secondary | ICD-10-CM | POA: Diagnosis not present

## 2024-10-19 DIAGNOSIS — R609 Edema, unspecified: Secondary | ICD-10-CM | POA: Diagnosis not present

## 2024-10-19 DIAGNOSIS — F03918 Unspecified dementia, unspecified severity, with other behavioral disturbance: Secondary | ICD-10-CM | POA: Diagnosis not present

## 2024-10-19 DIAGNOSIS — I10 Essential (primary) hypertension: Secondary | ICD-10-CM | POA: Diagnosis not present

## 2024-10-19 DIAGNOSIS — E785 Hyperlipidemia, unspecified: Secondary | ICD-10-CM | POA: Diagnosis not present

## 2024-10-19 NOTE — Telephone Encounter (Signed)
 Spoke with patient's wife, she said plan is to not go palliative yet, will continue current medications for now

## 2024-10-20 DIAGNOSIS — Z7901 Long term (current) use of anticoagulants: Secondary | ICD-10-CM | POA: Diagnosis not present

## 2024-10-20 DIAGNOSIS — E785 Hyperlipidemia, unspecified: Secondary | ICD-10-CM | POA: Diagnosis not present

## 2024-10-20 DIAGNOSIS — Z79899 Other long term (current) drug therapy: Secondary | ICD-10-CM | POA: Diagnosis not present

## 2024-10-20 DIAGNOSIS — I10 Essential (primary) hypertension: Secondary | ICD-10-CM | POA: Diagnosis not present

## 2024-10-20 DIAGNOSIS — F0390 Unspecified dementia without behavioral disturbance: Secondary | ICD-10-CM | POA: Diagnosis not present

## 2024-10-25 ENCOUNTER — Encounter: Payer: Self-pay | Admitting: Family Medicine

## 2024-10-26 DIAGNOSIS — E870 Hyperosmolality and hypernatremia: Secondary | ICD-10-CM | POA: Diagnosis not present

## 2024-10-26 DIAGNOSIS — E876 Hypokalemia: Secondary | ICD-10-CM | POA: Diagnosis not present

## 2024-10-26 DIAGNOSIS — M6281 Muscle weakness (generalized): Secondary | ICD-10-CM | POA: Diagnosis not present

## 2024-10-26 DIAGNOSIS — E860 Dehydration: Secondary | ICD-10-CM | POA: Diagnosis not present

## 2024-10-26 NOTE — Telephone Encounter (Signed)
 Copied from CRM [id number]. Topic: General - Other >> Oct 26, 2024 11:35 AM Sophia H wrote: Reason for CRM: Patients wife Arland is following up on her request to speak with PCP's office manager regarding a letter that is needed for the long term care company. Looks like she sent a my chart message yesterday and it was routed over to PCP but no response yet. Please reach out # [phone]  I don't provide letters concerning pt care. Dr. Jordan would have to look into this.   Please advise.

## 2024-10-27 DIAGNOSIS — E875 Hyperkalemia: Secondary | ICD-10-CM | POA: Diagnosis not present

## 2024-10-28 DIAGNOSIS — E785 Hyperlipidemia, unspecified: Secondary | ICD-10-CM | POA: Diagnosis not present

## 2024-10-28 DIAGNOSIS — G309 Alzheimer's disease, unspecified: Secondary | ICD-10-CM | POA: Diagnosis not present

## 2024-10-28 DIAGNOSIS — I10 Essential (primary) hypertension: Secondary | ICD-10-CM | POA: Diagnosis not present

## 2024-10-28 DIAGNOSIS — D6859 Other primary thrombophilia: Secondary | ICD-10-CM | POA: Diagnosis not present

## 2024-10-28 DIAGNOSIS — I2510 Atherosclerotic heart disease of native coronary artery without angina pectoris: Secondary | ICD-10-CM | POA: Diagnosis not present

## 2024-10-29 DIAGNOSIS — E785 Hyperlipidemia, unspecified: Secondary | ICD-10-CM | POA: Diagnosis not present

## 2024-10-29 DIAGNOSIS — I10 Essential (primary) hypertension: Secondary | ICD-10-CM | POA: Diagnosis not present

## 2024-10-29 DIAGNOSIS — G309 Alzheimer's disease, unspecified: Secondary | ICD-10-CM | POA: Diagnosis not present

## 2024-10-29 DIAGNOSIS — D6859 Other primary thrombophilia: Secondary | ICD-10-CM | POA: Diagnosis not present

## 2024-10-29 DIAGNOSIS — I2510 Atherosclerotic heart disease of native coronary artery without angina pectoris: Secondary | ICD-10-CM | POA: Diagnosis not present

## 2024-10-30 DIAGNOSIS — D6859 Other primary thrombophilia: Secondary | ICD-10-CM | POA: Diagnosis not present

## 2024-10-30 DIAGNOSIS — E785 Hyperlipidemia, unspecified: Secondary | ICD-10-CM | POA: Diagnosis not present

## 2024-10-30 DIAGNOSIS — I10 Essential (primary) hypertension: Secondary | ICD-10-CM | POA: Diagnosis not present

## 2024-10-30 DIAGNOSIS — I2510 Atherosclerotic heart disease of native coronary artery without angina pectoris: Secondary | ICD-10-CM | POA: Diagnosis not present

## 2024-10-30 DIAGNOSIS — G309 Alzheimer's disease, unspecified: Secondary | ICD-10-CM | POA: Diagnosis not present

## 2024-10-31 DIAGNOSIS — D6859 Other primary thrombophilia: Secondary | ICD-10-CM | POA: Diagnosis not present

## 2024-10-31 DIAGNOSIS — E785 Hyperlipidemia, unspecified: Secondary | ICD-10-CM | POA: Diagnosis not present

## 2024-10-31 DIAGNOSIS — G309 Alzheimer's disease, unspecified: Secondary | ICD-10-CM | POA: Diagnosis not present

## 2024-10-31 DIAGNOSIS — I10 Essential (primary) hypertension: Secondary | ICD-10-CM | POA: Diagnosis not present

## 2024-10-31 DIAGNOSIS — I2510 Atherosclerotic heart disease of native coronary artery without angina pectoris: Secondary | ICD-10-CM | POA: Diagnosis not present

## 2024-11-01 ENCOUNTER — Encounter: Payer: Self-pay | Admitting: Family Medicine

## 2024-11-01 DIAGNOSIS — D6859 Other primary thrombophilia: Secondary | ICD-10-CM | POA: Diagnosis not present

## 2024-11-01 DIAGNOSIS — E785 Hyperlipidemia, unspecified: Secondary | ICD-10-CM | POA: Diagnosis not present

## 2024-11-01 DIAGNOSIS — G309 Alzheimer's disease, unspecified: Secondary | ICD-10-CM | POA: Diagnosis not present

## 2024-11-01 DIAGNOSIS — I10 Essential (primary) hypertension: Secondary | ICD-10-CM | POA: Diagnosis not present

## 2024-11-01 DIAGNOSIS — I2510 Atherosclerotic heart disease of native coronary artery without angina pectoris: Secondary | ICD-10-CM | POA: Diagnosis not present

## 2024-11-02 DIAGNOSIS — I2510 Atherosclerotic heart disease of native coronary artery without angina pectoris: Secondary | ICD-10-CM | POA: Diagnosis not present

## 2024-11-02 DIAGNOSIS — I10 Essential (primary) hypertension: Secondary | ICD-10-CM | POA: Diagnosis not present

## 2024-11-02 DIAGNOSIS — E785 Hyperlipidemia, unspecified: Secondary | ICD-10-CM | POA: Diagnosis not present

## 2024-11-02 DIAGNOSIS — G309 Alzheimer's disease, unspecified: Secondary | ICD-10-CM | POA: Diagnosis not present

## 2024-11-02 DIAGNOSIS — D6859 Other primary thrombophilia: Secondary | ICD-10-CM | POA: Diagnosis not present

## 2024-11-03 DIAGNOSIS — D6859 Other primary thrombophilia: Secondary | ICD-10-CM | POA: Diagnosis not present

## 2024-11-03 DIAGNOSIS — I10 Essential (primary) hypertension: Secondary | ICD-10-CM | POA: Diagnosis not present

## 2024-11-03 DIAGNOSIS — G309 Alzheimer's disease, unspecified: Secondary | ICD-10-CM | POA: Diagnosis not present

## 2024-11-03 DIAGNOSIS — I2510 Atherosclerotic heart disease of native coronary artery without angina pectoris: Secondary | ICD-10-CM | POA: Diagnosis not present

## 2024-11-03 DIAGNOSIS — E785 Hyperlipidemia, unspecified: Secondary | ICD-10-CM | POA: Diagnosis not present

## 2024-11-04 DIAGNOSIS — I10 Essential (primary) hypertension: Secondary | ICD-10-CM | POA: Diagnosis not present

## 2024-11-04 DIAGNOSIS — E785 Hyperlipidemia, unspecified: Secondary | ICD-10-CM | POA: Diagnosis not present

## 2024-11-04 DIAGNOSIS — D6859 Other primary thrombophilia: Secondary | ICD-10-CM | POA: Diagnosis not present

## 2024-11-04 DIAGNOSIS — G309 Alzheimer's disease, unspecified: Secondary | ICD-10-CM | POA: Diagnosis not present

## 2024-11-04 DIAGNOSIS — I2510 Atherosclerotic heart disease of native coronary artery without angina pectoris: Secondary | ICD-10-CM | POA: Diagnosis not present

## 2024-11-05 ENCOUNTER — Encounter: Payer: Self-pay | Admitting: Family Medicine

## 2024-11-07 ENCOUNTER — Telehealth: Payer: Self-pay | Admitting: *Deleted

## 2024-11-07 NOTE — Telephone Encounter (Signed)
 Copied from CRM #[id number]. Topic: General - Deceased Patient >> 2024-11-23  3:38 PM Ashley R wrote: Name of caller: Esley Brooking  Date of death: 11-20-24   Pt. Wife requesting a callback from nurse/office admin for Dr. Jordan [phone]

## 2024-11-07 DEATH — deceased

## 2024-11-11 NOTE — Telephone Encounter (Signed)
 Called Ms Gallant. She is doing well in general. Mr Boettner was in hospice at the time of his death, he was in no pain, it was a peaceful death. His family was with him, including her daughter, who drove from [place] . Celebrated his birthday with family on [birth year]. Dietrick Barris, MD

## 2024-11-14 ENCOUNTER — Ambulatory Visit: Admitting: Family Medicine

## 2024-12-05 ENCOUNTER — Ambulatory Visit: Admitting: Physician Assistant

## 2024-12-22 ENCOUNTER — Ambulatory Visit: Admitting: Podiatry

## 2025-01-13 ENCOUNTER — Ambulatory Visit: Payer: Medicare Other
